# Patient Record
Sex: Female | Born: 1995 | Race: White | Hispanic: No | Marital: Single
Health system: Midwestern US, Community
[De-identification: ages and names within clinical notes are randomized; demographics above are authoritative.]

## PROBLEM LIST (undated history)

## (undated) DIAGNOSIS — K219 Gastro-esophageal reflux disease without esophagitis: Secondary | ICD-10-CM

## (undated) DIAGNOSIS — Z8619 Personal history of other infectious and parasitic diseases: Secondary | ICD-10-CM

## (undated) DIAGNOSIS — F419 Anxiety disorder, unspecified: Secondary | ICD-10-CM

## (undated) DIAGNOSIS — G43909 Migraine, unspecified, not intractable, without status migrainosus: Secondary | ICD-10-CM

## (undated) DIAGNOSIS — F909 Attention-deficit hyperactivity disorder, unspecified type: Secondary | ICD-10-CM

## (undated) DIAGNOSIS — F418 Other specified anxiety disorders: Secondary | ICD-10-CM

## (undated) DIAGNOSIS — N83209 Unspecified ovarian cyst, unspecified side: Secondary | ICD-10-CM

## (undated) DIAGNOSIS — Z0000 Encounter for general adult medical examination without abnormal findings: Secondary | ICD-10-CM

## (undated) DIAGNOSIS — E282 Polycystic ovarian syndrome: Secondary | ICD-10-CM

## (undated) DIAGNOSIS — R87629 Unspecified abnormal cytological findings in specimens from vagina: Secondary | ICD-10-CM

## (undated) DIAGNOSIS — F32A Depression, unspecified: Secondary | ICD-10-CM

## (undated) DIAGNOSIS — D473 Essential (hemorrhagic) thrombocythemia: Secondary | ICD-10-CM

## (undated) DIAGNOSIS — S42301A Unspecified fracture of shaft of humerus, right arm, initial encounter for closed fracture: Secondary | ICD-10-CM

## (undated) DIAGNOSIS — R945 Abnormal results of liver function studies: Secondary | ICD-10-CM

## (undated) DIAGNOSIS — F329 Major depressive disorder, single episode, unspecified: Secondary | ICD-10-CM

## (undated) DIAGNOSIS — Z7289 Other problems related to lifestyle: Secondary | ICD-10-CM

## (undated) DIAGNOSIS — I10 Essential (primary) hypertension: Secondary | ICD-10-CM

## (undated) DIAGNOSIS — R51 Headache: Secondary | ICD-10-CM

## (undated) DIAGNOSIS — T7840XA Allergy, unspecified, initial encounter: Secondary | ICD-10-CM

## (undated) DIAGNOSIS — E782 Mixed hyperlipidemia: Secondary | ICD-10-CM

## (undated) HISTORY — DX: Depression, unspecified: F32.A

## (undated) HISTORY — DX: Essential (primary) hypertension: I10

## (undated) HISTORY — DX: Abnormal results of liver function studies: R94.5

## (undated) HISTORY — DX: Anxiety disorder, unspecified: F41.9

## (undated) HISTORY — DX: Personal history of other infectious and parasitic diseases: Z86.19

## (undated) HISTORY — DX: Other problems related to lifestyle: Z72.89

## (undated) HISTORY — DX: Attention-deficit hyperactivity disorder, unspecified type: F90.9

## (undated) HISTORY — DX: Mixed hyperlipidemia: E78.2

## (undated) HISTORY — DX: Allergy, unspecified, initial encounter: T78.40XA

## (undated) HISTORY — DX: Encounter for general adult medical examination without abnormal findings: Z00.00

## (undated) HISTORY — DX: Unspecified abnormal cytological findings in specimens from vagina: R87.629

## (undated) HISTORY — DX: Other specified anxiety disorders: F41.8

## (undated) HISTORY — DX: Gastro-esophageal reflux disease without esophagitis: K21.9

## (undated) HISTORY — DX: Major depressive disorder, single episode, unspecified: F32.9

## (undated) HISTORY — DX: Essential (hemorrhagic) thrombocythemia: D47.3

## (undated) HISTORY — DX: Headache: R51

## (undated) HISTORY — DX: Unspecified fracture of shaft of humerus, right arm, initial encounter for closed fracture: S42.301A

---
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
## 2014-01-14 ENCOUNTER — Encounter (HOSPITAL_BASED_OUTPATIENT_CLINIC_OR_DEPARTMENT_OTHER): Payer: Self-pay

## 2014-01-14 ENCOUNTER — Emergency Department (HOSPITAL_BASED_OUTPATIENT_CLINIC_OR_DEPARTMENT_OTHER)

## 2014-01-14 ENCOUNTER — Emergency Department (HOSPITAL_BASED_OUTPATIENT_CLINIC_OR_DEPARTMENT_OTHER)
Admission: EM | Admit: 2014-01-14 | Discharge: 2014-01-14 | Disposition: A | Discharge status: Home / Self Care | Attending: Emergency Medicine | Admitting: Emergency Medicine

## 2014-01-14 DIAGNOSIS — R1031 Right lower quadrant pain: Secondary | ICD-10-CM

## 2014-01-14 DIAGNOSIS — Z79899 Other long term (current) drug therapy: Secondary | ICD-10-CM | POA: Diagnosis not present

## 2014-01-14 DIAGNOSIS — B9689 Other specified bacterial agents as the cause of diseases classified elsewhere: Secondary | ICD-10-CM

## 2014-01-14 DIAGNOSIS — Z3202 Encounter for pregnancy test, result negative: Secondary | ICD-10-CM | POA: Insufficient documentation

## 2014-01-14 DIAGNOSIS — Z720 Tobacco use: Secondary | ICD-10-CM | POA: Diagnosis not present

## 2014-01-14 DIAGNOSIS — N760 Acute vaginitis: Secondary | ICD-10-CM | POA: Diagnosis not present

## 2014-01-14 HISTORY — DX: Unspecified ovarian cyst, unspecified side: N83.209

## 2014-01-14 LAB — URINALYSIS, ROUTINE W REFLEX MICROSCOPIC
Bilirubin Urine: NEGATIVE
Glucose, UA: NEGATIVE mg/dL
Ketones, ur: NEGATIVE mg/dL
Leukocytes, UA: NEGATIVE
Nitrite: NEGATIVE
Protein, ur: NEGATIVE mg/dL
Specific Gravity, Urine: 1.022 (ref 1.005–1.030)
Urobilinogen, UA: 0.2 mg/dL (ref 0.0–1.0)
pH: 5.5 (ref 5.0–8.0)

## 2014-01-14 LAB — BASIC METABOLIC PANEL
Anion gap: 13 (ref 5–15)
BUN: 9 mg/dL (ref 6–23)
CO2: 25 mEq/L (ref 19–32)
Calcium: 9.7 mg/dL (ref 8.4–10.5)
Chloride: 102 mEq/L (ref 96–112)
Creatinine, Ser: 0.9 mg/dL (ref 0.50–1.10)
GFR calc Af Amer: >90 mL/min (ref >90)
GFR calc non Af Amer: >90 mL/min (ref >90)
Glucose, Bld: 96 mg/dL (ref 70–99)
Potassium: 4 mEq/L (ref 3.7–5.3)
Sodium: 140 mEq/L (ref 137–147)

## 2014-01-14 LAB — WET PREP, GENITAL: Trich, Wet Prep: NONE SEEN

## 2014-01-14 LAB — CBC WITH DIFFERENTIAL/PLATELET
Basophils Absolute: 0 10*3/uL (ref 0.0–0.1)
Basophils Relative: 0 % (ref 0–1)
Eosinophils Absolute: 0.3 10*3/uL (ref 0.0–0.7)
Eosinophils Relative: 4 % (ref 0–5)
HCT: 43 % (ref 36.0–46.0)
Hemoglobin: 14.5 g/dL (ref 12.0–15.0)
Lymphocytes Relative: 27 % (ref 12–46)
Lymphs Abs: 2.4 10*3/uL (ref 0.7–4.0)
MCH: 30.9 pg (ref 26.0–34.0)
MCHC: 33.7 g/dL (ref 30.0–36.0)
MCV: 91.7 fL (ref 78.0–100.0)
Monocytes Absolute: 0.8 10*3/uL (ref 0.1–1.0)
Monocytes Relative: 9 % (ref 3–12)
Neutro Abs: 5.3 10*3/uL (ref 1.7–7.7)
Neutrophils Relative %: 60 % (ref 43–77)
Platelets: 374 10*3/uL (ref 150–400)
RBC: 4.69 MIL/uL (ref 3.87–5.11)
RDW: 13.1 % (ref 11.5–15.5)
WBC: 8.9 10*3/uL (ref 4.0–10.5)

## 2014-01-14 LAB — PREGNANCY, URINE: Preg Test, Ur: NEGATIVE

## 2014-01-14 LAB — URINE MICROSCOPIC-ADD ON

## 2014-01-14 MED ORDER — METRONIDAZOLE 500 MG PO TABS: 500.0000 mg | ORAL_TABLET | Freq: Two times a day (BID) | ORAL | Status: DC

## 2014-01-14 MED ORDER — METRONIDAZOLE 500 MG PO TABS
500.0000 mg | ORAL_TABLET | Freq: Once | ORAL | Status: AC
  Administered 2014-01-14: 500 mg via ORAL
  Filled 2014-01-14: qty 1

## 2014-01-14 NOTE — ED Provider Notes (Signed)
CSN: [id number]     Arrival date & time 01/14/14  1245 History   First MD Initiated Contact with Patient 01/14/14 1352     Chief Complaint  Patient presents with  . RLQ abdominal pain      (Consider location/radiation/quality/duration/timing/severity/associated sxs/prior Treatment) HPI  Kathryn [name] is a 18 y.o. female complaining of acute onset of severe right lower quadrant pains this morning. And rated at 10 out of 10, no exacerbating or alleviating factors identified. Patient is a she has a history of frequent ovarian cysts but states this is much more severe than normal. There is associated nausea which she thinks is secondary to severe pain. Menstruation started yesterday. She denies fever, chills, abnormal vaginal discharge dysuria, hematuria. Pain is colicky, rated at 3 out of 10 right now, resolved spontaneously.  Past Medical History  Diagnosis Date  . Ovarian cyst    Past Surgical History  Procedure Laterality Date  . Adenoidectomy    . Tympanostomy tube placement     No family history on file. History  Substance Use Topics  . Smoking status: Current Some Day Smoker  . Smokeless tobacco: Not on file  . Alcohol Use: No   OB History    No data available     Review of Systems  10 systems reviewed and found to be negative, except as noted in the HPI.   Allergies  Review of patient's allergies indicates no known allergies.  Home Medications   Prior to Admission medications   Medication Sig Start Date End Date Taking? Authorizing Provider  FLUoxetine (PROZAC) 20 MG capsule Take 20 mg by mouth daily.    Historical Provider, MD  ibuprofen (ADVIL,MOTRIN) 200 MG tablet Take 200 mg by mouth every 6 (six) hours as needed for moderate pain.    Historical Provider, MD  metroNIDAZOLE (FLAGYL) 500 MG tablet Take 1 tablet (500 mg total) by mouth 2 (two) times daily. One po bid x 7 days 01/14/14   Elmyra Ricks Shrihan Putt, PA-C   BP 128/87 mmHg  Pulse 62  Temp(Src) 99.2 F (37.3  C) (Oral)  Resp 18  Ht 5\' 8"  (1.727 m)  Wt 183 lb (83.008 kg)  BMI 27.83 kg/m2  SpO2 100%  LMP 01/13/2014 Physical Exam  Constitutional: She is oriented to person, place, and time. She appears well-developed and well-nourished. No distress.  HENT:  Head: Normocephalic and atraumatic.  Mouth/Throat: Oropharynx is clear and moist.  Eyes: Conjunctivae and EOM are normal. Pupils are equal, round, and reactive to light.  Neck: Normal range of motion.  Cardiovascular: Normal rate, regular rhythm and intact distal pulses.   Pulmonary/Chest: Effort normal and breath sounds normal. No stridor. No respiratory distress. She has no wheezes. She has no rales. She exhibits no tenderness.  Abdominal: Soft. Bowel sounds are normal. She exhibits no distension and no mass. There is tenderness. There is no rebound and no guarding.  Mild tenderness to deep palpation of the right lower quadrant with no guarding or rebound, so as, Rovsingh and obturator are negative.  Genitourinary:  Pelvic exam a chaperoned by nurse, no rashes or lesions, there is scant dark blood pooled in the posterior fourchette. No cervical or adnexal tenderness to palpation  Musculoskeletal: Normal range of motion.  Neurological: She is alert and oriented to person, place, and time.  Psychiatric: She has a normal mood and affect.  Nursing note and vitals reviewed.   ED Course  Procedures (including critical care time) Labs Review Labs Reviewed  WET PREP,  GENITAL - Abnormal; Notable for the following:    Yeast Wet Prep HPF POC FEW (*)    Clue Cells Wet Prep HPF POC MODERATE (*)    WBC, Wet Prep HPF POC MODERATE (*)    All other components within normal limits  URINALYSIS, ROUTINE W REFLEX MICROSCOPIC - Abnormal; Notable for the following:    APPearance CLOUDY (*)    Hgb urine dipstick MODERATE (*)    All other components within normal limits  URINE MICROSCOPIC-ADD ON - Abnormal; Notable for the following:    Squamous  Epithelial / LPF MANY (*)    Bacteria, UA MANY (*)    All other components within normal limits  GC/CHLAMYDIA PROBE AMP  PREGNANCY, URINE  CBC WITH DIFFERENTIAL  BASIC METABOLIC PANEL    Imaging Review US Transvaginal Non-ob  01/14/2014   CLINICAL DATA:  Right lower quadrant pain.  EXAM: TRANSABDOMINAL AND TRANSVAGINAL ULTRASOUND OF PELVIS  TECHNIQUE: Both transabdominal and transvaginal ultrasound examinations of the pelvis were performed. Transabdominal technique was performed for global imaging of the pelvis including uterus, ovaries, adnexal regions, and pelvic cul-de-sac. It was necessary to proceed with endovaginal exam following the transabdominal exam to visualize the uterus, endometrium, and ovaries.  COMPARISON:  None  FINDINGS: Uterus  Measurements: 6.8 x 3.0 x 3.5 cm. 1.4 x 1.1 x 1.0 cm fundal fibroid.  Endometrium  Thickness: 6 mm.  Currently menstruating.  Right ovary  Measurements: 3.6 x 1.5 x 2.5 cm. multiple follicles. /no adnexal mass.  Left ovary  Measurements: 3.2 x 2.1 x 2.5 cm. 1.6 x 1.3 x 0.8 cm cyst or prominent follicle. Multiple small follicles.  Other findings  No free fluid.  IMPRESSION: 1. Small fibroid in the fundus of the uterus. 2. No other significant abnormalities.   Electronically Signed   By: Rozetta Nunnery M.D.   On: 01/14/2014 15:14   US Pelvis Complete  01/14/2014   CLINICAL DATA:  Right lower quadrant pain.  EXAM: TRANSABDOMINAL AND TRANSVAGINAL ULTRASOUND OF PELVIS  TECHNIQUE: Both transabdominal and transvaginal ultrasound examinations of the pelvis were performed. Transabdominal technique was performed for global imaging of the pelvis including uterus, ovaries, adnexal regions, and pelvic cul-de-sac. It was necessary to proceed with endovaginal exam following the transabdominal exam to visualize the uterus, endometrium, and ovaries.  COMPARISON:  None  FINDINGS: Uterus  Measurements: 6.8 x 3.0 x 3.5 cm. 1.4 x 1.1 x 1.0 cm fundal fibroid.  Endometrium   Thickness: 6 mm.  Currently menstruating.  Right ovary  Measurements: 3.6 x 1.5 x 2.5 cm. multiple follicles. /no adnexal mass.  Left ovary  Measurements: 3.2 x 2.1 x 2.5 cm. 1.6 x 1.3 x 0.8 cm cyst or prominent follicle. Multiple small follicles.  Other findings  No free fluid.  IMPRESSION: 1. Small fibroid in the fundus of the uterus. 2. No other significant abnormalities.   Electronically Signed   By: Rozetta Nunnery M.D.   On: 01/14/2014 15:14     EKG Interpretation None      MDM   Final diagnoses:  Right lower quadrant pain  Bacterial vaginosis    Filed Vitals:   01/14/14 1256 01/14/14 1314 01/14/14 1512 01/14/14 1703  BP: 131/96  126/74 128/87  Pulse: 78  67 62  Temp: 99 F (37.2 C)   99.2 F (37.3 C)  TempSrc: Oral   Oral  Resp: 18  18 18   Height:  5\' 8"  (1.727 m)    Weight:  183 lb (83.008 kg)  SpO2: 100%  100% 100%    Medications  metroNIDAZOLE (FLAGYL) tablet 500 mg (500 mg Oral Given 01/14/14 1729)    Kathryn [name] is a 18 y.o. female presenting with severe right lower quadrant pain resolved spontaneously to 3 out of 10. Patient declines pelvic exam because she is menstruating. States that she has never had a pelvic exam before. Negotiated with patient to obtain transvaginal ultrasound.  She also acquiesces to pelvic exam, there is no significant abnormalities on pelvic.  Blood work with no leukocytosis, the urine sample was contaminated with many epithelial cells. There are moderate clue cells and white blood cells on wet prep. We'll treat for bacterial vaginosis. Serial abdominal exams remain benign. I discussed the possibility of obtaining CAT scan to rule out early appendicitis. Ensure decision-making capacity patient has declined this we have had an extensive discussion of return precautions.  Discussed case with attending MD who agrees with plan and stability to d/c to home.   Evaluation does not show pathology that would require ongoing emergent intervention  or inpatient treatment. Pt is hemodynamically stable and mentating appropriately. Discussed findings and plan with patient/guardian, who agrees with care plan. All questions answered. Return precautions discussed and outpatient follow up given.   Discharge Medication List as of 01/14/2014  5:19 PM    START taking these medications   Details  metroNIDAZOLE (FLAGYL) 500 MG tablet Take 1 tablet (500 mg total) by mouth 2 (two) times daily. One po bid x 7 days, Starting 01/14/2014, Until Discontinued, Print             Monico Blitz, PA-C 01/14/14 2011  Ephraim Hamburger, MD 01/17/14 1019

## 2014-01-14 NOTE — ED Notes (Signed)
Patient here with RLQ abdominal pain since am. Reports that she has frequent ovarian cyst and this feels the same. Started her menstrual cycle yesterday

## 2014-01-14 NOTE — Discharge Instructions (Signed)
Do not drink alcohol while you are taking flagyl (metronidazole) because it will make you very sick.  Return to the emergency room for severely worsening abdominal pain, abdominal pain that localizes to a particular area (especially the right lower part of the belly), pain that persists past 8-10 hours, blood in stool or vomit, severe weakness, fainting, or fever.   Maintain hydration by drinking small amounts of clear fluids frequently, then soft diet, and then advance to a solid diet as tolerated. Avoid foods that are spicy, high in fat or dairy. Bacterial Vaginosis Bacterial vaginosis is a vaginal infection that occurs when the normal balance of bacteria in the vagina is disrupted. It results from an overgrowth of certain bacteria. This is the most common vaginal infection in women of childbearing age. Treatment is important to prevent complications, especially in pregnant women, as it can cause a premature delivery. CAUSES  Bacterial vaginosis is caused by an increase in harmful bacteria that are normally present in smaller amounts in the vagina. Several different kinds of bacteria can cause bacterial vaginosis. However, the reason that the condition develops is not fully understood. RISK FACTORS Certain activities or behaviors can put you at an increased risk of developing bacterial vaginosis, including:  Having a new sex partner or multiple sex partners.  Douching.  Using an intrauterine device (IUD) for contraception. Women do not get bacterial vaginosis from toilet seats, bedding, swimming pools, or contact with objects around them. SIGNS AND SYMPTOMS  Some women with bacterial vaginosis have no signs or symptoms. Common symptoms include:  Grey vaginal discharge.  A fishlike odor with discharge, especially after sexual intercourse.  Itching or burning of the vagina and vulva.  Burning or pain with urination. DIAGNOSIS  Your health care provider will take a medical history and  examine the vagina for signs of bacterial vaginosis. A sample of vaginal fluid may be taken. Your health care provider will look at this sample under a microscope to check for bacteria and abnormal cells. A vaginal pH test may also be done.  TREATMENT  Bacterial vaginosis may be treated with antibiotic medicines. These may be given in the form of a pill or a vaginal cream. A second round of antibiotics may be prescribed if the condition comes back after treatment.  HOME CARE INSTRUCTIONS   Only take over-the-counter or prescription medicines as directed by your health care provider.  If antibiotic medicine was prescribed, take it as directed. Make sure you finish it even if you start to feel better.  Do not have sex until treatment is completed.  Tell all sexual partners that you have a vaginal infection. They should see their health care provider and be treated if they have problems, such as a mild rash or itching.  Practice safe sex by using condoms and only having one sex partner. SEEK MEDICAL CARE IF:   Your symptoms are not improving after 3 days of treatment.  You have increased discharge or pain.  You have a fever. MAKE SURE YOU:   Understand these instructions.  Will watch your condition.  Will get help right away if you are not doing well or get worse. FOR MORE INFORMATION  Centers for Disease Control and Prevention, Division of STD Prevention: AppraiserFraud.fi American Sexual Health Association (ASHA): www.ashastd.org  Document Released: 02/02/2005 Document Revised: 11/23/2012 Document Reviewed: 09/14/2012 [facility] Patient Information 2015 [place], [place]. This information is not intended to replace advice given to you by your health care provider. Make sure you discuss  any questions you have with your health care provider.   Please obtain primary care using resource guide below. But the minute you were seen in the emergency room and that they will need to obtain records  for further outpatient management.   Emergency Department Resource Guide 1) Find a Doctor and Pay Out of Pocket Although you won't have to find out who is covered by your insurance plan, it is a good idea to ask around and get recommendations. You will then need to call the office and see if the doctor you have chosen will accept you as a new patient and what types of options they offer for patients who are self-pay. Some doctors offer discounts or will set up payment plans for their patients who do not have insurance, but you will need to ask so you aren't surprised when you get to your appointment.  2) Contact Your Local Health Department Not all health departments have doctors that can see patients for sick visits, but many do, so it is worth a call to see if yours does. If you don't know where your local health department is, you can check in your phone book. The CDC also has a tool to help you locate your state's health department, and many state websites also have listings of all of their local health departments.  3) Find a [facility] If your illness is not likely to be very severe or complicated, you may want to try a walk in clinic. These are popping up all over the country in pharmacies, drugstores, and shopping centers. They're usually staffed by nurse practitioners or physician assistants that have been trained to treat common illnesses and complaints. They're usually fairly quick and inexpensive. However, if you have serious medical issues or chronic medical problems, these are probably not your best option.  No Primary Care Doctor: - Call Health Connect at  [phone] - they can help you locate a primary care doctor that  accepts your insurance, provides certain services, etc. - Physician Referral Service- [phone]  Chronic Pain Problems: Organization         Address  Phone   Notes  [facility]  [phone] Patients need to be referred by their primary  care doctor.   Medication Assistance: Organization         Address  Phone   Notes  [facility] Medication [facility] [address] [place], [place] [address] [phone] --Must be a resident of [facility] -- Must have NO insurance coverage whatsoever (no Medicaid/ Medicare, etc.) -- The pt. MUST have a primary care doctor that directs their care regularly and follows them in the community   Med[facility]ist  [phone]   [employer]  [phone]    Agencies that provide inexpensive medical care: Organization         Address  Phone   Notes  [place]  [phone]   [facility]    [phone]   [facility] [place], [place] [address] [phone]   [place] [address] [place] [phone]   Planned Parenthood    [phone]   [facility]    [phone]   [place] and [address], [place] Phone:  [phone], Fax:  [phone] Hours of Operation:  9 am - 6 pm, M-F.  Also accepts  Medicaid/Medicare and self-pay.  [facility] for [address], [address], [place] Phone: [phone], Fax: [phone]. Hours of Operation:  8:30 am - 5:30 pm, M-F.  Also accepts Medicaid and self-pay.  [facility] [place] [address] [place] Phone: [phone]   [place], [place], [place] [phone], Ext. [phone] Mondays & Thursdays: 7-9 AM.  First 15 patients are seen on a first come, first serve basis.    [place] Providers:  Organization         Address  Phone   Notes  [facility] [address],  [phone] Also accepts self-pay patients.  [facility] [address], [place]  [phone]   [place], [address], [place] [phone]   [facility] [address], [place] [phone]   Lucianne Lei [address], [place]   [phone] Only accepts [place] Access [place] patients after they have their name applied to their card.   Self-Pay (no insurance) in [facility]:  Organization         Address  Phone   Notes  Sickle Cell Patients, [facility] Internal [facility] [place] [phone]   [facility] [place] [phone]   [facility] [place]  [place], [address] [place], [place] [phone]   [facility]/Dr. Osei-Bonsu  [address] [place] or [address], [place] [phone] Phone number for both [place] and [place] locations is the same.  [facility]gent [facility] and [facility] [address] [place] [phone]   [facility] [address] [place] or [address] [phone]   [facility] [address] [place] [phone], phone; [phone], fax Sees patients 1st and 3rd Saturday of every month.  Must not qualify for public or private insurance (i.e. Medicaid, Medicare, [place] Health Choice, Veterans' Benefits)  Household income should be no more than 200% of the poverty level The clinic cannot treat you if you are pregnant or think you are pregnant  Sexually transmitted diseases are not treated at the clinic.    Dental Care: Organization         Address  Phone  Notes  [facility] Department of [facility] [place] [phone] Accepts children up to age 48 who are enrolled in [place] or [place]; pregnant women with a Medicaid card; and children who have applied for Medicaid or [place] Health Choice, but were declined, whose parents can pay a reduced fee at  time of service.  [facility] Department of [facility]  [address], [place] [phone] Accepts children up to age 43 who are enrolled in [place] or [place]; pregnant women with a Medicaid card; and children who have applied for Medicaid or [place] Health Choice, but were declined, whose parents can pay a reduced fee at time of service.  [place] Adult Dental Access PROGRAM  [place] [phone] Patients are seen by appointment only. Walk-ins are not accepted. [place] will see patients 18 years of age and older. Monday - Tuesday (8am-5pm) Most  Wednesdays (8:30-5pm) $30 per visit, cash only  [facility] Adult Dental Access PROGRAM  [address], [facility] [phone] Patients are seen by appointment only. Walk-ins are not accepted. [place] will see patients 65 years of age and older. One Wednesday Evening (Monthly: Volunteer Based).  $30 per visit, cash only  [place]  [phone] for adults; Children under age 21, call Graduate Pediatric Dentistry at [phone]. Children aged 28-14, please call [phone] to request a pediatric application.  Dental services are provided in all areas of dental care including fillings, crowns and bridges, complete and partial dentures, implants, gum treatment, root canals, and extractions. Preventive care is also provided. Treatment is provided to both adults and children. Patients are selected via a lottery and there is often a waiting list.   [facility] [address], [place]  [phone] [email or web address]   [facility] [address] [place], [place] [phone], Ext. [phone] Second and Fourth Thursday of each month, opens at 6:30 AM; Clinic ends at 9 AM.  Patients are seen on a first-come first-served basis, and a limited number are seen during each clinic.   [facility]  [address] Hillard Danker  [place], [place] [phone]   Eligibility Requirements You must have lived in [place], [place], or [place] counties for at least the last three months.   You cannot be eligible for state or federal sponsored [employer], including [employer], [place], or [employer].   You generally cannot be eligible for healthcare insurance through your employer.    How to apply: Eligibility screenings are held every Tuesday and Wednesday afternoon from 1:00 pm until 4:00 pm. You do not need an appointment for the interview!  [facility] [address], [place], [place]   [place]  [place] Department  [place]  [phone]    Behavioral Health Resources in the Community: Intensive Outpatient Programs Organization         Address  Phone  Notes  [place]. [address] [place], [place] [phone]   [facility] Outpatient [address] [place], [place]   ADS: Alcohol & Drug Svcs [address] [place], [place]   [place] [address],  [place], [place] or [phone]   Substance Abuse Resources Organization         Address  Phone  Notes  Alcohol and Drug Services  [phone]   [place]  [phone]   [facility] [place]   Chinita Pester  [phone]   Residential & Outpatient Substance Abuse Program  [phone]   Psychological Services Organization         Address  Phone  Notes  [facility] [place]  [place]  [phone]   [place] [address] [place] or [phone]    Mobile Crisis Teams Organization         Address  Phone  Notes  [facility], Mobile Crisis Care Unit  [phone]   Assertive Psychotherapeutic  Services  [address] [place], [place]   [place] [address], [place] [phone]    Self-Help/Support Groups Organization         Address  Phone  Notes  Mental Health Assoc. of [place] - variety of support groups  [place] Call for more information  Narcotics Anonymous (NA), Caring Services [address], [employer] [place]  2 meetings at this location   Special educational needs teacher         Address  Phone  Notes  ASAP Residential Treatment [place],    [place]  [phone]   [facility]  [address], [place] [address], [place], [place]   [place], [place] [phone] Admissions: 8am-3pm M-F  Incentives Substance [place] [address],    [place], [place] [phone]   [facility] [address] [place], [place], [place]   [facility] [address]  [place], [place]   [facility] Programs - Intensive Outpatient [address]een [address], [place], [place]   [facility] ([place] [address].,  [place], [place] [phone] or [phone]   [facility] Services (RTS) [address] [place], [place] Accepts Medicaid  [facility] [place] [address]  [place] [phone] Substance Abuse/Addiction Treatment   Health Central Organization         Address  Phone  Notes  [facility]  [phone]   Domenic Schwab, PhD [address] Arlis Porta [place], [place]   [phone] or [phone]   [place], [place] [phone]   [facility] [phone] [place], [place] [phone] Insurance/Medicaid/sponsorship through [facility] and Families [address], [address]                                    [place], [place] [phone] [place] [address], [place] [phone]    Dr. Adele Schilder  [phone]   [facility] Dept. 1) [address] [place] 2) [place] 3)  [address], [place] [phone]  [phone]   [place] [phone] or [phone] (After Hours)

## 2014-01-14 NOTE — ED Notes (Signed)
Pt given a coke to drink.

## 2014-01-16 LAB — GC/CHLAMYDIA PROBE AMP
CT Probe RNA: NEGATIVE
GC Probe RNA: NEGATIVE

## 2014-04-03 ENCOUNTER — Encounter (HOSPITAL_BASED_OUTPATIENT_CLINIC_OR_DEPARTMENT_OTHER): Payer: Self-pay | Admitting: Emergency Medicine

## 2014-04-03 ENCOUNTER — Emergency Department (HOSPITAL_BASED_OUTPATIENT_CLINIC_OR_DEPARTMENT_OTHER)

## 2014-04-03 ENCOUNTER — Emergency Department (HOSPITAL_BASED_OUTPATIENT_CLINIC_OR_DEPARTMENT_OTHER)
Admission: EM | Admit: 2014-04-03 | Discharge: 2014-04-03 | Disposition: A | Discharge status: Home / Self Care | Attending: Emergency Medicine | Admitting: Emergency Medicine

## 2014-04-03 DIAGNOSIS — Z79899 Other long term (current) drug therapy: Secondary | ICD-10-CM | POA: Diagnosis not present

## 2014-04-03 DIAGNOSIS — Z3202 Encounter for pregnancy test, result negative: Secondary | ICD-10-CM | POA: Diagnosis not present

## 2014-04-03 DIAGNOSIS — Z792 Long term (current) use of antibiotics: Secondary | ICD-10-CM | POA: Insufficient documentation

## 2014-04-03 DIAGNOSIS — N898 Other specified noninflammatory disorders of vagina: Secondary | ICD-10-CM | POA: Diagnosis not present

## 2014-04-03 DIAGNOSIS — Z8639 Personal history of other endocrine, nutritional and metabolic disease: Secondary | ICD-10-CM | POA: Insufficient documentation

## 2014-04-03 DIAGNOSIS — N3000 Acute cystitis without hematuria: Secondary | ICD-10-CM

## 2014-04-03 DIAGNOSIS — Z87891 Personal history of nicotine dependence: Secondary | ICD-10-CM | POA: Insufficient documentation

## 2014-04-03 DIAGNOSIS — R1031 Right lower quadrant pain: Secondary | ICD-10-CM

## 2014-04-03 HISTORY — DX: Polycystic ovarian syndrome: E28.2

## 2014-04-03 LAB — URINE MICROSCOPIC-ADD ON

## 2014-04-03 LAB — URINALYSIS, ROUTINE W REFLEX MICROSCOPIC
Bilirubin Urine: NEGATIVE
Glucose, UA: NEGATIVE mg/dL
Hgb urine dipstick: NEGATIVE
Ketones, ur: NEGATIVE mg/dL
Nitrite: NEGATIVE
Protein, ur: NEGATIVE mg/dL
Specific Gravity, Urine: 1.014 (ref 1.005–1.030)
Urobilinogen, UA: 0.2 mg/dL (ref 0.0–1.0)
pH: 6 (ref 5.0–8.0)

## 2014-04-03 LAB — COMPREHENSIVE METABOLIC PANEL
ALT: 22 U/L (ref 0–35)
AST: 22 U/L (ref 0–37)
Albumin: 5 g/dL (ref 3.5–5.2)
Alkaline Phosphatase: 68 U/L (ref 39–117)
Anion gap: 3 — ABNORMAL LOW (ref 5–15)
BUN: 10 mg/dL (ref 6–23)
CO2: 27 mmol/L (ref 19–32)
Calcium: 9.1 mg/dL (ref 8.4–10.5)
Chloride: 109 mmol/L (ref 96–112)
Creatinine, Ser: 0.76 mg/dL (ref 0.50–1.10)
GFR calc Af Amer: >90 mL/min (ref >90)
GFR calc non Af Amer: >90 mL/min (ref >90)
Glucose, Bld: 87 mg/dL (ref 70–99)
Potassium: 3.9 mmol/L (ref 3.5–5.1)
Sodium: 139 mmol/L (ref 135–145)
Total Bilirubin: 0.5 mg/dL (ref 0.3–1.2)
Total Protein: 7.8 g/dL (ref 6.0–8.3)

## 2014-04-03 LAB — CBC WITH DIFFERENTIAL/PLATELET
Basophils Absolute: 0 10*3/uL (ref 0.0–0.1)
Basophils Relative: 0 % (ref 0–1)
Eosinophils Absolute: 0.4 10*3/uL (ref 0.0–0.7)
Eosinophils Relative: 3 % (ref 0–5)
HCT: 42.5 % (ref 36.0–46.0)
Hemoglobin: 14.2 g/dL (ref 12.0–15.0)
Lymphocytes Relative: 29 % (ref 12–46)
Lymphs Abs: 3.7 10*3/uL (ref 0.7–4.0)
MCH: 30.5 pg (ref 26.0–34.0)
MCHC: 33.4 g/dL (ref 30.0–36.0)
MCV: 91.2 fL (ref 78.0–100.0)
Monocytes Absolute: 1.3 10*3/uL — ABNORMAL HIGH (ref 0.1–1.0)
Monocytes Relative: 10 % (ref 3–12)
Neutro Abs: 7.6 10*3/uL (ref 1.7–7.7)
Neutrophils Relative %: 58 % (ref 43–77)
Platelets: 397 10*3/uL (ref 150–400)
RBC: 4.66 MIL/uL (ref 3.87–5.11)
RDW: 12.7 % (ref 11.5–15.5)
WBC: 12.9 10*3/uL — ABNORMAL HIGH (ref 4.0–10.5)

## 2014-04-03 LAB — WET PREP, GENITAL
Clue Cells Wet Prep HPF POC: NONE SEEN
Trich, Wet Prep: NONE SEEN
Yeast Wet Prep HPF POC: NONE SEEN

## 2014-04-03 LAB — PREGNANCY, URINE: Preg Test, Ur: NEGATIVE

## 2014-04-03 MED ORDER — NITROFURANTOIN MONOHYD MACRO 100 MG PO CAPS: 100.0000 mg | ORAL_CAPSULE | Freq: Two times a day (BID) | ORAL | Status: DC

## 2014-04-03 NOTE — ED Notes (Signed)
Painful urination for a couple episodes then recurrent today. Pt vomited X1 yesterday.

## 2014-04-03 NOTE — ED Provider Notes (Signed)
CSN: [id number]     Arrival date & time 04/03/14  0941 History   First MD Initiated Contact with Patient 04/03/14 1241     Chief Complaint  Patient presents with  . Abdominal Pain     (Consider location/radiation/quality/duration/timing/severity/associated sxs/prior Treatment) HPI Kathryn [name] is a 19 year old female with past medical history of ovarian cysts, PCOS who presents to the ER complaining of abdominal pain and dysuria. Patient reports intermittent right lower quadrant abdominal pain for the past 2 days with associated dysuria. Patient reports associated nausea, vomiting during these episodes. Patient states the pain and associated symptoms resolve spontaneously, and she remains asymptomatic in between these episodes of abdominal pain. Patient states the episode of abdominal pain she had this morning lasted approximately 3 hours, and it was worse than her previous episodes. Patient denies fever, flank pain, dizziness, weakness, chest pain, shortness of breath, vaginal discharge, vaginal pain, diarrhea.  Past Medical History  Diagnosis Date  . Ovarian cyst   . PCOS (polycystic ovarian syndrome)    Past Surgical History  Procedure Laterality Date  . Adenoidectomy    . Tympanostomy tube placement     No family history on file. History  Substance Use Topics  . Smoking status: Former Research scientist (life sciences)  . Smokeless tobacco: Current User    Types: Chew  . Alcohol Use: No   OB History    No data available     Review of Systems  Constitutional: Negative for fever.  HENT: Negative for trouble swallowing.   Eyes: Negative for visual disturbance.  Respiratory: Negative for shortness of breath.   Cardiovascular: Negative for chest pain.  Gastrointestinal: Positive for nausea, vomiting and abdominal pain.  Genitourinary: Positive for dysuria. Negative for vaginal bleeding, vaginal discharge and vaginal pain.  Musculoskeletal: Negative for neck pain.  Skin: Negative for rash.   Neurological: Negative for dizziness, weakness and numbness.  Psychiatric/Behavioral: Negative.       Allergies  Review of patient's allergies indicates no known allergies.  Home Medications   Prior to Admission medications   Medication Sig Start Date End Date Taking? Authorizing Provider  ibuprofen (ADVIL,MOTRIN) 200 MG tablet Take 200 mg by mouth every 6 (six) hours as needed for moderate pain.   Yes Historical Provider, MD  FLUoxetine (PROZAC) 20 MG capsule Take 20 mg by mouth daily.    Historical Provider, MD  metroNIDAZOLE (FLAGYL) 500 MG tablet Take 1 tablet (500 mg total) by mouth 2 (two) times daily. One po bid x 7 days 01/14/14   Elmyra Ricks Pisciotta, PA-C  nitrofurantoin, macrocrystal-monohydrate, (MACROBID) 100 MG capsule Take 1 capsule (100 mg total) by mouth 2 (two) times daily. X 5 days 04/03/14   Carrie Mew, PA-C   BP 135/87 mmHg  Pulse 82  Temp(Src) 98.4 F (36.9 C) (Oral)  Resp 16  Ht 5\' 8"  (1.727 m)  Wt 185 lb (83.915 kg)  BMI 28.14 kg/m2  SpO2 100%  LMP 03/14/2014 Physical Exam  Constitutional: She is oriented to person, place, and time. She appears well-developed and well-nourished. No distress.  HENT:  Head: Normocephalic and atraumatic.  Mouth/Throat: Oropharynx is clear and moist. No oropharyngeal exudate.  Eyes: Right eye exhibits no discharge. Left eye exhibits no discharge. No scleral icterus.  Neck: Normal range of motion.  Cardiovascular: Normal rate, regular rhythm and normal heart sounds.   No murmur heard. Pulmonary/Chest: Effort normal and breath sounds normal. No respiratory distress.  Abdominal: Soft. Normal appearance and bowel sounds are normal. There is tenderness in  the right lower quadrant.  Genitourinary: There is no rash, tenderness, lesion or injury on the right labia. There is no rash, tenderness, lesion or injury on the left labia. Cervix exhibits no motion tenderness, no discharge and no friability. Right adnexum displays no mass,  no tenderness and no fullness. Left adnexum displays no mass, no tenderness and no fullness. No erythema, tenderness or bleeding in the vagina. No foreign body around the vagina. No signs of injury around the vagina. Vaginal discharge found.  Mild amount of white colored discharge noted in vaginal vault. No adnexal tenderness, cervical motion tenderness, cervical friability or discharge noted. Chaperone present during entire pelvic exam.  Musculoskeletal: Normal range of motion. She exhibits no edema or tenderness.  Neurological: She is alert and oriented to person, place, and time. She has normal strength. No cranial nerve deficit or sensory deficit. Coordination normal. GCS eye subscore is 4. GCS verbal subscore is 5. GCS motor subscore is 6.  Skin: Skin is warm and dry. No rash noted. She is not diaphoretic.  Psychiatric: She has a normal mood and affect.  Nursing note and vitals reviewed.   ED Course  Procedures (including critical care time) Labs Review Labs Reviewed  WET PREP, GENITAL - Abnormal; Notable for the following:    WBC, Wet Prep HPF POC MODERATE (*)    All other components within normal limits  URINALYSIS, ROUTINE W REFLEX MICROSCOPIC - Abnormal; Notable for the following:    APPearance CLOUDY (*)    Leukocytes, UA MODERATE (*)    All other components within normal limits  URINE MICROSCOPIC-ADD ON - Abnormal; Notable for the following:    Squamous Epithelial / LPF FEW (*)    Bacteria, UA FEW (*)    All other components within normal limits  CBC WITH DIFFERENTIAL/PLATELET - Abnormal; Notable for the following:    WBC 12.9 (*)    Monocytes Absolute 1.3 (*)    All other components within normal limits  COMPREHENSIVE METABOLIC PANEL - Abnormal; Notable for the following:    Anion gap 3 (*)    All other components within normal limits  URINE CULTURE  PREGNANCY, URINE  GC/CHLAMYDIA PROBE AMP ()    Imaging Review No results found.   EKG  Interpretation None      MDM   Final diagnoses:  RLQ abdominal pain  Acute cystitis without hematuria    Patient here with pelvic pain, intermittently over the past several days. Throughout patient's stay in the ER she is completely asymptomatic. With serial abdominal exams there is minimal amount of pain in her lower right pelvic region, no adnexal tenderness, GU exam benign. Is recommended the patient is followed with a pelvic ultrasound and transvaginal rule out of ovarian torsion, however patient denies this test being performed. I explained the risks and benefits of tonight's test, and explained why this test was needed at this point to further evaluate her pain, however patient is adamant that she did not want a pelvic ultrasound. With patient's benign abdominal exam, and minimal amount of symptoms in the ER, clinical picture does not show any signs of hemodynamic instability. There is no concern for acute abdomen,Patient is nontoxic, nonseptic appearing, in no apparent distress.  Patient's pain and other symptoms adequately managed in emergency department.  Fluid bolus given.  Labs, and vitals reviewed.  Patient does not meet the SIRS or Sepsis criteria.  On repeat exam patient does not have a surgical abdomin and there are no peritoneal signs.  No indication of appendicitis, bowel obstruction, bowel perforation, cholecystitis, diverticulitis, PID or ectopic pregnancy. Therefore I discussed strict return precautions with patient, and provided treatment for urinary tract infection. I discussed the dangers exam of refusing further care, however agent remained adamant in her decision. I strongly encouraged patient to continue the antibiotics, return or with any worsening of symptoms, return to the ER should she have any questions or concerns. Patient verbalizes understanding and agreement of this plan.  BP 135/87 mmHg  Pulse 82  Temp(Src) 98.4 F (36.9 C) (Oral)  Resp 16  Ht 5\' 8"  (1.727 m)   Wt 185 lb (83.915 kg)  BMI 28.14 kg/m2  SpO2 100%  LMP 03/14/2014  Signed,  Dahlia Bailiff, PA-C 12:05 AM  Patient seen and discussed with Dr. Varney Biles, MD  Carrie Mew, PA-C 04/04/14 0005  Varney Biles, MD 04/04/14 1530

## 2014-04-03 NOTE — ED Notes (Signed)
PA at bedside.

## 2014-04-03 NOTE — Discharge Instructions (Signed)
Urinary Tract Infection Urinary tract infections (UTIs) can develop anywhere along your urinary tract. Your urinary tract is your body's drainage system for removing wastes and extra water. Your urinary tract includes two kidneys, two ureters, a bladder, and a urethra. Your kidneys are a pair of bean-shaped organs. Each kidney is about the size of your fist. They are located below your ribs, one on each side of your spine. CAUSES Infections are caused by microbes, which are microscopic organisms, including fungi, viruses, and bacteria. These organisms are so small that they can only be seen through a microscope. Bacteria are the microbes that most commonly cause UTIs. SYMPTOMS  Symptoms of UTIs may vary by age and gender of the patient and by the location of the infection. Symptoms in young women typically include a frequent and intense urge to urinate and a painful, burning feeling in the bladder or urethra during urination. Older women and men are more likely to be tired, shaky, and weak and have muscle aches and abdominal pain. A fever may mean the infection is in your kidneys. Other symptoms of a kidney infection include pain in your back or sides below the ribs, nausea, and vomiting. DIAGNOSIS To diagnose a UTI, your caregiver will ask you about your symptoms. Your caregiver also will ask to provide a urine sample. The urine sample will be tested for bacteria and white blood cells. White blood cells are made by your body to help fight infection. TREATMENT  Typically, UTIs can be treated with medication. Because most UTIs are caused by a bacterial infection, they usually can be treated with the use of antibiotics. The choice of antibiotic and length of treatment depend on your symptoms and the type of bacteria causing your infection. HOME CARE INSTRUCTIONS  If you were prescribed antibiotics, take them exactly as your caregiver instructs you. Finish the medication even if you feel better after you  have only taken some of the medication.  Drink enough water and fluids to keep your urine clear or pale yellow.  Avoid caffeine, tea, and carbonated beverages. They tend to irritate your bladder.  Empty your bladder often. Avoid holding urine for long periods of time.  Empty your bladder before and after sexual intercourse.  After a bowel movement, women should cleanse from front to back. Use each tissue only once. SEEK MEDICAL CARE IF:   You have back pain.  You develop a fever.  Your symptoms do not begin to resolve within 3 days. SEEK IMMEDIATE MEDICAL CARE IF:   You have severe back pain or lower abdominal pain.  You develop chills.  You have nausea or vomiting.  You have continued burning or discomfort with urination. MAKE SURE YOU:   Understand these instructions.  Will watch your condition.  Will get help right away if you are not doing well or get worse. Document Released: 11/12/2004 Document Revised: 08/04/2011 Document Reviewed: 03/13/2011 [facility] Patient Information 2015 [place], [place]. This information is not intended to replace advice given to you by your health care provider. Make sure you discuss any questions you have with your health care provider.   Pelvic Pain Female pelvic pain can be caused by many different things and start from a variety of places. Pelvic pain refers to pain that is located in the lower half of the abdomen and between your hips. The pain may occur over a short period of time (acute) or may be reoccurring (chronic). The cause of pelvic pain may be related to disorders affecting  the female reproductive organs (gynecologic), but it may also be related to the bladder, kidney stones, an intestinal complication, or muscle or skeletal problems. Getting help right away for pelvic pain is important, especially if there has been severe, sharp, or a sudden onset of unusual pain. It is also important to get help right away because some types of  pelvic pain can be life threatening.  CAUSES  Below are only some of the causes of pelvic pain. The causes of pelvic pain can be in one of several categories.   Gynecologic.  Pelvic inflammatory disease.  Sexually transmitted infection.  Ovarian cyst or a twisted ovarian ligament (ovarian torsion).  Uterine lining that grows outside the uterus (endometriosis).  Fibroids, cysts, or tumors.  Ovulation.  Pregnancy.  Pregnancy that occurs outside the uterus (ectopic pregnancy).  Miscarriage.  Labor.  Abruption of the placenta or ruptured uterus.  Infection.  Uterine infection (endometritis).  Bladder infection.  Diverticulitis.  Miscarriage related to a uterine infection (septic abortion).  Bladder.  Inflammation of the bladder (cystitis).  Kidney stone(s).  Gastrointestinal.  Constipation.  Diverticulitis.  Neurologic.  Trauma.  Feeling pelvic pain because of mental or emotional causes (psychosomatic).  Cancers of the bowel or pelvis. EVALUATION  Your caregiver will want to take a careful history of your concerns. This includes recent changes in your health, a careful gynecologic history of your periods (menses), and a sexual history. Obtaining your family history and medical history is also important. Your caregiver may suggest a pelvic exam. A pelvic exam will help identify the location and severity of the pain. It also helps in the evaluation of which organ system may be involved. In order to identify the cause of the pelvic pain and be properly treated, your caregiver may order tests. These tests may include:   A pregnancy test.  Pelvic ultrasonography.  An X-ray exam of the abdomen.  A urinalysis or evaluation of vaginal discharge.  Blood tests. HOME CARE INSTRUCTIONS   Only take over-the-counter or prescription medicines for pain, discomfort, or fever as directed by your caregiver.   Rest as directed by your caregiver.   Eat a balanced  diet.   Drink enough fluids to make your urine clear or pale yellow, or as directed.   Avoid sexual intercourse if it causes pain.   Apply warm or cold compresses to the lower abdomen depending on which one helps the pain.   Avoid stressful situations.   Keep a journal of your pelvic pain. Write down when it started, where the pain is located, and if there are things that seem to be associated with the pain, such as food or your menstrual cycle.  Follow up with your caregiver as directed.  SEEK MEDICAL CARE IF:  Your medicine does not help your pain.  You have abnormal vaginal discharge. SEEK IMMEDIATE MEDICAL CARE IF:   You have heavy bleeding from the vagina.   Your pelvic pain increases.   You feel light-headed or faint.   You have chills.   You have pain with urination or blood in your urine.   You have uncontrolled diarrhea or vomiting.   You have a fever or persistent symptoms for more than 3 days.  You have a fever and your symptoms suddenly get worse.   You are being physically or sexually abused.  MAKE SURE YOU:  Understand these instructions.  Will watch your condition.  Will get help if you are not doing well or get worse. Document Released:  12/31/2003 Document Revised: 06/19/2013 Document Reviewed: 05/25/2011 [employer] Patient Information 2015 [place], [place]. This information is not intended to replace advice given to you by your health care provider. Make sure you discuss any questions you have with your health care provider.

## 2014-04-03 NOTE — ED Notes (Signed)
Pt getting in gown 

## 2014-04-04 LAB — GC/CHLAMYDIA PROBE AMP (~~LOC~~) NOT AT ARMC
Chlamydia: NEGATIVE
Neisseria Gonorrhea: NEGATIVE

## 2014-04-05 LAB — URINE CULTURE: Colony Count: 3000

## 2014-04-11 ENCOUNTER — Encounter: Payer: Self-pay | Admitting: Medical

## 2014-04-11 ENCOUNTER — Ambulatory Visit (INDEPENDENT_AMBULATORY_CARE_PROVIDER_SITE_OTHER): Admitting: Medical

## 2014-04-11 VITALS — BP 138/80 | HR 72 | Temp 98.8°F | Ht 68.0 in | Wt 185.8 lb

## 2014-04-11 DIAGNOSIS — F418 Other specified anxiety disorders: Secondary | ICD-10-CM

## 2014-04-11 DIAGNOSIS — R102 Pelvic and perineal pain: Secondary | ICD-10-CM

## 2014-04-11 DIAGNOSIS — E282 Polycystic ovarian syndrome: Secondary | ICD-10-CM

## 2014-04-11 DIAGNOSIS — R109 Unspecified abdominal pain: Secondary | ICD-10-CM | POA: Insufficient documentation

## 2014-04-11 DIAGNOSIS — N949 Unspecified condition associated with female genital organs and menstrual cycle: Secondary | ICD-10-CM

## 2014-04-11 DIAGNOSIS — K219 Gastro-esophageal reflux disease without esophagitis: Secondary | ICD-10-CM

## 2014-04-11 DIAGNOSIS — F3161 Bipolar disorder, current episode mixed, mild: Secondary | ICD-10-CM | POA: Insufficient documentation

## 2014-04-11 DIAGNOSIS — J3089 Other allergic rhinitis: Secondary | ICD-10-CM

## 2014-04-11 DIAGNOSIS — D72829 Elevated white blood cell count, unspecified: Secondary | ICD-10-CM

## 2014-04-11 DIAGNOSIS — F3178 Bipolar disorder, in full remission, most recent episode mixed: Secondary | ICD-10-CM | POA: Insufficient documentation

## 2014-04-11 DIAGNOSIS — F4323 Adjustment disorder with mixed anxiety and depressed mood: Secondary | ICD-10-CM

## 2014-04-11 DIAGNOSIS — F411 Generalized anxiety disorder: Secondary | ICD-10-CM

## 2014-04-11 DIAGNOSIS — J309 Allergic rhinitis, unspecified: Secondary | ICD-10-CM | POA: Insufficient documentation

## 2014-04-11 HISTORY — DX: Other specified anxiety disorders: F41.8

## 2014-04-11 MED ORDER — RANITIDINE HCL 150 MG PO TABS: 150.0000 mg | ORAL_TABLET | Freq: Two times a day (BID) | ORAL | Status: DC

## 2014-04-11 MED ORDER — SERTRALINE HCL 25 MG PO TABS: 25.0000 mg | ORAL_TABLET | Freq: Every day | ORAL | Status: DC

## 2014-04-11 NOTE — Progress Notes (Signed)
   Subjective:    Patient ID: Kathryn [name], female    DOB: 1995/06/02, 19 y.o.   MRN: [id number]  HPI   I have reviewed pt PMH, PSH, FH, Social History and Surgical History  Seasonal allergies- occasional and mild. Otc meds help.  Depression and anxiety-Pt used to be on prozac. Was on for about a year. But started taking daily for 6 months. But she felt like it did not help much. But prior med was expired. Mild presently.  GERD- Pt states symptoms comes and goes. Diet related. Will last 2 weeks or so then subside. Pt used to have rantidine rx.  Pt states   Pt stopped smoking 7-8 months ago.  LMP- Not late. States not pregnant.  Pt went to ED  Last week and was treated for uti but culture showed insignificant growth. Pt has hx of pcos. Recent dx by gyn. She was given rx of ocp. Her culture was basically negative. Ancillary studies were normal. Cbc- mild wbc was elevated. G and C.  Pt basically describing occasional  intermittent rlq pain in the past week. Pain returns randomly every 2-3 days. Will occur for about 30 minutes to couple of hours. This pain on and off for about a week. Some occasioanal feel of fever and sweats with this.  Today no significant pain.  Hx of pcos.   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 High School education.             Review of Systems  Constitutional: Negative for fever, chills and fatigue.  Respiratory: Negative for apnea, cough, choking, chest tightness, shortness of breath and wheezing.   Cardiovascular: Negative for chest pain and palpitations.  Gastrointestinal: Positive for abdominal pain.       Occasional gerd but none now.  Endocrine: Negative for polydipsia, polyphagia and polyuria.  Genitourinary: Negative for dysuria, urgency, frequency, flank pain, vaginal bleeding, vaginal discharge, difficulty urinating, vaginal pain and menstrual problem.    Neurological: Negative for dizziness and facial asymmetry.  Psychiatric/Behavioral: Positive for dysphoric mood. Negative for suicidal ideas, behavioral problems, confusion, sleep disturbance and agitation.       Mild.       Objective:   Physical Exam  General Mental Status- Alert. General Appearance- Not in acute distress.   Skin General: Color- Normal Color. Moisture- Normal Moisture.  Neck Carotid Arteries- Normal color. Moisture- Normal Moisture. No carotid bruits. No JVD.  Chest and Lung Exam Auscultation: Breath Sounds:-Normal.  Cardiovascular Auscultation:Rythm- Regular. Murmurs & Other Heart Sounds:Auscultation of the heart reveals- No Murmurs.  Abdomen Inspection:-Inspeection Normal. Palpation/Percussion:Note:No mass. Palpation and Percussion of the abdomen reveal- faint rt adnexal region Tender, Non Distended + BS, no rebound or guarding.    Neurologic Cranial Nerve exam:- CN III-XII intact(No nystagmus), symmetric smile. Strength:- 5/5 equal and symmetric strength both upper and lower extremities.      Assessment & Plan:

## 2014-04-11 NOTE — Progress Notes (Signed)
Pre visit review using our clinic review tool, if applicable. No additional management support is needed unless otherwise documented below in the visit note. 

## 2014-04-11 NOTE — Assessment & Plan Note (Addendum)
With some depression. Rx sertraline.

## 2014-04-11 NOTE — Patient Instructions (Addendum)
Allergic rhinitis Mild spring time. None presently.   GERD (gastroesophageal reflux disease) Healthy diet and rx rantidine   Generalized anxiety disorder With some depression. Rx sertraline.   PCOS (polycystic ovarian syndrome) Recommend staying on ocp that gyn rx'd.   Adnexal tenderness, right With recent on and off pain.Hx of pcos. Will get pelvic US. And repeat cbc due to mild increase wbc other day.   If pain worsens or increases then must consider differntial dx of appendicitis. Doubt presently but if signs or symptoms change and after hours then ED eval.  If [name] negative but pain mild and persisting then could get out patient ct abd/pelivs.     Follow up in 3-4 wks or as needed.

## 2014-04-11 NOTE — Assessment & Plan Note (Signed)
Healthy diet and rx rantidine

## 2014-04-11 NOTE — Assessment & Plan Note (Signed)
Recommend staying on ocp that gyn rx'd.

## 2014-04-11 NOTE — Assessment & Plan Note (Signed)
With recent on and off pain.Hx of pcos. Will get pelvic US. And repeat cbc due to mild increase wbc other day.   If pain worsens or increases then must consider differntial dx of appendicitis. Doubt presently but if signs or symptoms change and after hours then ED eval.  If [name] negative but pain mild and persisting then could get out patient ct abd/pelivs.

## 2014-04-11 NOTE — Assessment & Plan Note (Signed)
Mild spring time. None presently.

## 2014-04-12 ENCOUNTER — Ambulatory Visit (HOSPITAL_BASED_OUTPATIENT_CLINIC_OR_DEPARTMENT_OTHER)
Admission: RE | Admit: 2014-04-12 | Discharge: 2014-04-12 | Disposition: A | Discharge status: Home / Self Care | Source: Ambulatory Visit | Attending: Medical | Admitting: Medical

## 2014-04-12 DIAGNOSIS — R61 Generalized hyperhidrosis: Secondary | ICD-10-CM | POA: Diagnosis not present

## 2014-04-12 DIAGNOSIS — R110 Nausea: Secondary | ICD-10-CM | POA: Diagnosis not present

## 2014-04-12 DIAGNOSIS — R102 Pelvic and perineal pain: Secondary | ICD-10-CM

## 2014-04-12 DIAGNOSIS — E282 Polycystic ovarian syndrome: Secondary | ICD-10-CM

## 2014-04-12 DIAGNOSIS — R6883 Chills (without fever): Secondary | ICD-10-CM | POA: Insufficient documentation

## 2014-04-12 LAB — CBC WITH DIFFERENTIAL/PLATELET
Basophils Absolute: 0.1 10*3/uL (ref 0.0–0.1)
Basophils Relative: 0.5 % (ref 0.0–3.0)
Eosinophils Absolute: 0.2 10*3/uL (ref 0.0–0.7)
Eosinophils Relative: 1.9 % (ref 0.0–5.0)
HCT: 41 % (ref 36.0–49.0)
Hemoglobin: 13.9 g/dL (ref 12.0–16.0)
Lymphocytes Relative: 21.9 % — ABNORMAL LOW (ref 24.0–48.0)
Lymphs Abs: 2.4 10*3/uL (ref 0.7–4.0)
MCHC: 34 g/dL (ref 31.0–37.0)
MCV: 91 fl (ref 78.0–98.0)
Monocytes Absolute: 1 10*3/uL (ref 0.1–1.0)
Monocytes Relative: 9.5 % (ref 3.0–12.0)
Neutro Abs: 7.3 10*3/uL (ref 1.4–7.7)
Neutrophils Relative %: 66.2 % (ref 43.0–71.0)
Platelets: 362 10*3/uL (ref 150.0–575.0)
RBC: 4.51 Mil/uL (ref 3.80–5.70)
RDW: 12.9 % (ref 11.4–15.5)
WBC: 11 10*3/uL (ref 4.5–13.5)

## 2014-06-11 ENCOUNTER — Ambulatory Visit (INDEPENDENT_AMBULATORY_CARE_PROVIDER_SITE_OTHER): Admitting: Medical

## 2014-06-11 ENCOUNTER — Encounter: Payer: Self-pay | Admitting: Medical

## 2014-06-11 ENCOUNTER — Other Ambulatory Visit (HOSPITAL_COMMUNITY)
Admission: RE | Admit: 2014-06-11 | Discharge: 2014-06-11 | Disposition: A | Discharge status: Home / Self Care | Source: Ambulatory Visit | Attending: Medical | Admitting: Medical

## 2014-06-11 VITALS — BP 132/87 | HR 86 | Temp 98.5°F | Ht 68.0 in | Wt 192.2 lb

## 2014-06-11 DIAGNOSIS — J301 Allergic rhinitis due to pollen: Secondary | ICD-10-CM

## 2014-06-11 DIAGNOSIS — R82998 Other abnormal findings in urine: Secondary | ICD-10-CM

## 2014-06-11 DIAGNOSIS — Z113 Encounter for screening for infections with a predominantly sexual mode of transmission: Secondary | ICD-10-CM | POA: Diagnosis not present

## 2014-06-11 DIAGNOSIS — N898 Other specified noninflammatory disorders of vagina: Secondary | ICD-10-CM

## 2014-06-11 DIAGNOSIS — N760 Acute vaginitis: Secondary | ICD-10-CM | POA: Diagnosis present

## 2014-06-11 DIAGNOSIS — F411 Generalized anxiety disorder: Secondary | ICD-10-CM | POA: Diagnosis not present

## 2014-06-11 DIAGNOSIS — N390 Urinary tract infection, site not specified: Secondary | ICD-10-CM

## 2014-06-11 LAB — POCT URINALYSIS DIPSTICK
Bilirubin, UA: NEGATIVE
Blood, UA: NEGATIVE
Glucose, UA: NEGATIVE
Ketones, UA: NEGATIVE
Nitrite, UA: NEGATIVE
Protein, UA: NEGATIVE
Spec Grav, UA: 1.015
Urobilinogen, UA: 0.2
pH, UA: 7

## 2014-06-11 MED ORDER — FLUCONAZOLE 150 MG PO TABS: 150.0000 mg | ORAL_TABLET | Freq: Once | ORAL | Status: DC

## 2014-06-11 MED ORDER — VENLAFAXINE HCL ER 75 MG PO CP24: 75.0000 mg | ORAL_CAPSULE | Freq: Every day | ORAL | Status: DC

## 2014-06-11 NOTE — Patient Instructions (Signed)
Generalized anxiety disorder Not controlled on sertraline. Discontinue. Rx effexor.    Vaginal discharge With discharge. Rx diflucan for yeast infections Urine ancillary studies and culture penidng.   Allergic rhinitis Use claritin and flonase otc. If your ear pain is more constant or severe notify us and would rx antibiotic. Now ear pain can just be pressure in eustachian tube. Recommend holding off on rx antibiotic presently as that would make potential yeast infection worse.     Follow up in 2-3 weeks for wellness exam. Come in fasting.

## 2014-06-11 NOTE — Assessment & Plan Note (Addendum)
Not controlled on sertraline. Discontinue. Rx effexor. May consider adding buspirone in near future.

## 2014-06-11 NOTE — Progress Notes (Signed)
Subjective:    Patient ID: Kathryn [name], female    DOB: Nov 06, 1995, 19 y.o.   MRN: [id number]  HPI   Pt states she has not been helped with sertraline. In the past she has been on prozac and it did not help. She states anxiety comes on randomly. At work only mild anxiety. More described some social anxiety.(moderate to severe). When worse will feel transient chest pressure)  Not reporting depression.  Pt thinks maybe has bv or yeast infection. Some mild white and itchy discharge for 2 days. No antibiotic recently. Girlfriend had yeast infection.  No back pain. No pain on urination.  Pt want [name] to use express scripts when established meds are working.   Some mild allergies recently nasal congestion and runny nose. Occasional faint mild ear pressure.   Review of Systems  Constitutional: Negative for fever, chills, diaphoresis, activity change and fatigue.  HENT: Positive for congestion, ear pain, postnasal drip and rhinorrhea.        Mild transient ear pain.  Respiratory: Negative for cough, chest tightness and shortness of breath.   Cardiovascular: Negative for chest pain, palpitations and leg swelling.  Gastrointestinal: Negative for nausea, vomiting and abdominal pain.  Genitourinary: Positive for vaginal discharge. Negative for dysuria, urgency, frequency, flank pain and pelvic pain.       With itch.  Musculoskeletal: Negative for back pain, neck pain and neck stiffness.  Neurological: Negative for dizziness, tremors, seizures, syncope, facial asymmetry, speech difficulty, weakness, light-headedness, numbness and headaches.  Psychiatric/Behavioral: Negative for suicidal ideas, behavioral problems, confusion, sleep disturbance, self-injury, dysphoric mood, decreased concentration and agitation. The patient is nervous/anxious.      Past Medical History  Diagnosis Date  . Ovarian cyst   . PCOS (polycystic ovarian syndrome)   . Allergy   . Anxiety   . Depression   . GERD  (gastroesophageal reflux disease)     History   Social History  . Marital Status: Single    Spouse Name: N/A  . Number of Children: N/A  . Years of Education: N/A   Occupational History  . Not on file.   Social History Main Topics  . Smoking status: Former Research scientist (life sciences)  . Smokeless tobacco: Current User    Types: Chew  . Alcohol Use: No  . Drug Use: No  . Sexual Activity: Not on file   Other Topics Concern  . Not on file   Social History Narrative    Past Surgical History  Procedure Laterality Date  . Adenoidectomy    . Tympanostomy tube placement    . Adnoids    . Tympanostomy tube placement      Family History  Problem Relation Age of Onset  . Hypertension Mother   . Hyperlipidemia Mother   . Heart disease Mother   . Hyperlipidemia Father     No Known Allergies  Current Outpatient Prescriptions on File Prior to Visit  Medication Sig Dispense Refill  . ibuprofen (ADVIL,MOTRIN) 200 MG tablet Take 200 mg by mouth every 6 (six) hours as needed for moderate pain.    . ranitidine (ZANTAC) 150 MG tablet Take 1 tablet (150 mg total) by mouth 2 (two) times daily. 60 tablet 0   No current facility-administered medications on file prior to visit.    BP 132/87 mmHg  Pulse 86  Temp(Src) 98.5 F (36.9 C) (Oral)  Ht 5\' 8"  (1.727 m)  Wt 192 lb 3.2 oz (87.181 kg)  BMI 29.23 kg/m2  SpO2 99%  LMP  05/11/2014       Objective:   Physical Exam  General  Mental Status - Alert. General Appearance - Well groomed. Not in acute distress.  Skin Rashes- No Rashes.  HEENT Head- Normal. Ear Auditory Canal - Left- Normal. Right - Normal.Tympanic Membrane- Left- minimal faint pink appearance to tm. Right- Normal. Eye Sclera/Conjunctiva- Left- Normal. Right- Normal. Nose & Sinuses Nasal Mucosa- Left-  Boggy and Congested. Right-  Boggy and  Congested. No  maxillary and frontal sinus pressure. Mouth & Throat Lips: Upper Lip- Normal: no dryness, cracking, pallor, cyanosis,  or vesicular eruption. Lower Lip-Normal: no dryness, cracking, pallor, cyanosis or vesicular eruption. Buccal Mucosa- Bilateral- No Aphthous ulcers. Oropharynx- No Discharge or Erythema. +pnd. Tonsils: Characteristics- Bilateral- No Erythema or Congestion. Size/Enlargement- Bilateral- No enlargement. Discharge- bilateral-None.  Neck Neck- Supple. No Masses.   Chest and Lung Exam Auscultation: Breath Sounds:-Clear even and unlabored.  Cardiovascular Auscultation:Rythm- Regular, rate and rhythm. Murmurs & Other Heart Sounds:Ausculatation of the heart reveal- No Murmurs.  Lymphatic Head & Neck General Head & Neck Lymphatics: Bilateral: Description- No Localized lymphadenopathy.      Assessment & Plan:

## 2014-06-11 NOTE — Progress Notes (Signed)
Pre visit review using our clinic review tool, if applicable. No additional management support is needed unless otherwise documented below in the visit note. 

## 2014-06-11 NOTE — Assessment & Plan Note (Signed)
Use claritin and flonase otc. If your ear pain is more constant or severe notify us and would rx antibiotic. Now ear pain can just be pressure in eustachian tube. Recommend holding off on rx antibiotic presently as that would make potential yeast infection worse.

## 2014-06-11 NOTE — Assessment & Plan Note (Signed)
With discharge. Rx diflucan for yeast infections Urine ancillary studies and culture penidng.

## 2014-06-12 LAB — URINE CYTOLOGY ANCILLARY ONLY
Chlamydia: NEGATIVE
Neisseria Gonorrhea: NEGATIVE
Trichomonas: NEGATIVE

## 2014-06-13 ENCOUNTER — Telehealth: Payer: Self-pay | Admitting: Medical

## 2014-06-13 LAB — URINE CULTURE: Colony Count: 9000

## 2014-06-13 LAB — URINE CYTOLOGY ANCILLARY ONLY
Bacterial vaginitis: POSITIVE — AB
Candida vaginitis: NEGATIVE

## 2014-06-13 MED ORDER — METRONIDAZOLE 500 MG PO TABS: 500.0000 mg | ORAL_TABLET | Freq: Three times a day (TID) | ORAL | Status: DC

## 2014-06-14 NOTE — Telephone Encounter (Signed)
Rx flagyl sent

## 2014-07-09 ENCOUNTER — Encounter: Payer: Self-pay | Admitting: Medical

## 2014-07-09 ENCOUNTER — Ambulatory Visit (INDEPENDENT_AMBULATORY_CARE_PROVIDER_SITE_OTHER): Admitting: Medical

## 2014-07-09 VITALS — BP 130/86 | HR 95 | Temp 98.2°F | Wt 194.0 lb

## 2014-07-09 DIAGNOSIS — F411 Generalized anxiety disorder: Secondary | ICD-10-CM | POA: Diagnosis not present

## 2014-07-09 DIAGNOSIS — G4700 Insomnia, unspecified: Secondary | ICD-10-CM | POA: Insufficient documentation

## 2014-07-09 MED ORDER — RANITIDINE HCL 150 MG PO TABS: 150.0000 mg | ORAL_TABLET | Freq: Two times a day (BID) | ORAL | Status: DC

## 2014-07-09 MED ORDER — HYDROXYZINE HCL 25 MG PO TABS: ORAL_TABLET | ORAL | Status: DC

## 2014-07-09 MED ORDER — VENLAFAXINE HCL ER 150 MG PO CP24: 150.0000 mg | ORAL_CAPSULE | Freq: Every day | ORAL | Status: DC

## 2014-07-09 NOTE — Patient Instructions (Addendum)
Generalized anxiety disorder Much improved. Will increase her dose at her request. About 2 weeks into tx pt thought she could use stronger dose.    Insomnia At end of exam noted some trouble sleeping at times. Will rx hydroxzine. To see if this helps. Discussed this recent insomina may be effexor related.  But will follow closely. Pt did not do well with only ssri in past. Effexor working better overall.   Did rx higher dose of Effexor.  Follow up in 1 month or as needed.   At end of exam noted some trouble sleeping at times. Will rx hydroxyzine. To see if this helps. Discussed this recent insomnia may be Effexor related.  Pt advised if any adverse effects with mood while taking effexor notify us immediatley. Pt agrees she will stop med and update [name] immediately if such were to occure.

## 2014-07-09 NOTE — Assessment & Plan Note (Signed)
At end of exam noted some trouble sleeping at times. Will rx hydroxzine. To see if this helps. Discussed this recent insomina may be effexor related.  But will follow closely. Pt did not do well with only ssri in past. Effexor working better overall.

## 2014-07-09 NOTE — Progress Notes (Signed)
Subjective:    Patient ID: Kathryn [name], female    DOB: 07-15-1995, 19 y.o.   MRN: [id number]  HPI   Pt in states significantly improved with effexor. Pt has been on med for about 1 month. She would like stronger dose.   Mood is improved and that was not primary issue. Her primary issue was anxiety. Anxiety about 70-80% better.     Review of Systems  Constitutional: Negative for fever, chills, diaphoresis, activity change and fatigue.  Respiratory: Negative for cough, chest tightness and shortness of breath.   Cardiovascular: Negative for chest pain, palpitations and leg swelling.  Gastrointestinal: Negative for nausea, vomiting and abdominal pain.  Musculoskeletal: Negative for neck pain and neck stiffness.  Neurological: Negative for dizziness, tremors, seizures, syncope, facial asymmetry, speech difficulty, weakness, light-headedness, numbness and headaches.  Psychiatric/Behavioral: Positive for sleep disturbance. Negative for behavioral problems, confusion and agitation. The patient is nervous/anxious.        About 70-80% anxiety  better. Also her mood is better.  Insomnia some since starting effexor.     Past Medical History  Diagnosis Date  . Ovarian cyst   . PCOS (polycystic ovarian syndrome)   . Allergy   . Anxiety   . Depression   . GERD (gastroesophageal reflux disease)     History   Social History  . Marital Status: Single    Spouse Name: N/A  . Number of Children: N/A  . Years of Education: N/A   Occupational History  . Not on file.   Social History Main Topics  . Smoking status: Former Research scientist (life sciences)  . Smokeless tobacco: Current User    Types: Chew  . Alcohol Use: No  . Drug Use: No  . Sexual Activity: Not on file   Other Topics Concern  . Not on file   Social History Narrative    Past Surgical History  Procedure Laterality Date  . Adenoidectomy    . Tympanostomy tube placement    . Adnoids    . Tympanostomy tube placement      Family  History  Problem Relation Age of Onset  . Hypertension Mother   . Hyperlipidemia Mother   . Heart disease Mother   . Hyperlipidemia Father     No Known Allergies  Current Outpatient Prescriptions on File Prior to Visit  Medication Sig Dispense Refill  . ibuprofen (ADVIL,MOTRIN) 200 MG tablet Take 200 mg by mouth every 6 (six) hours as needed for moderate pain.    . ranitidine (ZANTAC) 150 MG tablet Take 1 tablet (150 mg total) by mouth 2 (two) times daily. 60 tablet 0  . venlafaxine XR (EFFEXOR XR) 75 MG 24 hr capsule Take 1 capsule (75 mg total) by mouth daily with breakfast. 30 capsule 0   No current facility-administered medications on file prior to visit.    BP 130/86 mmHg  Pulse 95  Temp(Src) 98.2 F (36.8 C) (Oral)  Wt 194 lb (87.998 kg)  SpO2 99%  LMP 06/09/2014       Objective:   Physical Exam  General Mental Status- Alert. General Appearance- Not in acute distress.   Skin General: Color- Normal Color. Moisture- Normal Moisture.  Neck Carotid Arteries- Normal color. Moisture- Normal Moisture. No carotid bruits. No JVD.  Chest and Lung Exam Auscultation: Breath Sounds:-Normal.  Cardiovascular Auscultation:Rythm- Regular. Murmurs & Other Heart Sounds:Auscultation of the heart reveals- No Murmurs.    Neurologic Cranial Nerve exam:- CN III-XII intact(No nystagmus), symmetric smile. Strength:- 5/5 equal and symmetric  strength both upper and lower extremities.      Assessment & Plan:

## 2014-07-09 NOTE — Assessment & Plan Note (Signed)
Much improved. Will increase her dose at her request. About 2 weeks into tx pt thought she could use stronger dose.

## 2014-08-01 ENCOUNTER — Ambulatory Visit (INDEPENDENT_AMBULATORY_CARE_PROVIDER_SITE_OTHER): Admitting: Medical

## 2014-08-01 ENCOUNTER — Encounter: Payer: Self-pay | Admitting: Medical

## 2014-08-01 ENCOUNTER — Ambulatory Visit: Admitting: Internal Medicine

## 2014-08-01 VITALS — BP 126/78 | HR 93 | Temp 98.6°F | Ht 68.0 in | Wt 191.8 lb

## 2014-08-01 DIAGNOSIS — H6502 Acute serous otitis media, left ear: Secondary | ICD-10-CM | POA: Diagnosis not present

## 2014-08-01 DIAGNOSIS — H6690 Otitis media, unspecified, unspecified ear: Secondary | ICD-10-CM | POA: Insufficient documentation

## 2014-08-01 MED ORDER — FLUTICASONE PROPIONATE 50 MCG/ACT NA SUSP: 2.0000 | Freq: Every day | NASAL | Status: DC

## 2014-08-01 MED ORDER — VENLAFAXINE HCL ER 150 MG PO CP24: 150.0000 mg | ORAL_CAPSULE | Freq: Every day | ORAL | Status: DC

## 2014-08-01 MED ORDER — BENZONATATE 100 MG PO CAPS: 100.0000 mg | ORAL_CAPSULE | Freq: Three times a day (TID) | ORAL | Status: DC | PRN

## 2014-08-01 MED ORDER — CEFDINIR 300 MG PO CAPS: 300.0000 mg | ORAL_CAPSULE | Freq: Two times a day (BID) | ORAL | Status: DC

## 2014-08-01 NOTE — Progress Notes (Signed)
Pre visit review using our clinic review tool, if applicable. No additional management support is needed unless otherwise documented below in the visit note. 

## 2014-08-01 NOTE — Assessment & Plan Note (Signed)
Maybe related to congestion from allergic rhinitis. Rx flonase and cefdinir. Benzonate if her cough worsens.

## 2014-08-01 NOTE — Patient Instructions (Addendum)
For your nasal congestion I will rx flonase. For your left ear infection start cefdnir. For your cough, I prescribed benzonatate.  Follow u pin 7 days or as needed

## 2014-08-01 NOTE — Progress Notes (Signed)
   Subjective:    Patient ID: Kathryn [name], female    DOB: Aug 07, 1995, 19 y.o.   MRN: [id number]  HPI  Pt in with some nasal congestion, ha, some frontal sinus pressure, st and ear pain. Symptoms onset last 2-3 days  But she down plays frontal sinus pressure. Lt ear pain is worse. Rt side pain in ear is less. Constant mild st.   Pt did have fever yesterday of 102. No fever today.   Pt denies sneezing or itchy eyes.     Review of Systems  Constitutional: Positive for fever. Negative for chills and fatigue.  HENT: Positive for congestion, ear pain and sinus pressure. Negative for nosebleeds, postnasal drip and sore throat.   Respiratory: Negative for cough, chest tightness and wheezing.   Cardiovascular: Negative for chest pain and palpitations.  Gastrointestinal: Negative for abdominal pain.       Pt did not report any diarrhea of GI symptoms to me. Although on closing chart it was under reason for visit???  Musculoskeletal: Negative for back pain.  Neurological: Positive for headaches. Negative for dizziness, syncope, speech difficulty, weakness, light-headedness and numbness.       More yesterday and in frontal sinus area.  Hematological: Negative for adenopathy. Does not bruise/bleed easily.  Psychiatric/Behavioral: Negative for behavioral problems, confusion and dysphoric mood. The patient is not nervous/anxious.        Objective:   Physical Exam  General  Mental Status - Alert. General Appearance - Well groomed. Not in acute distress.  Skin Rashes- No Rashes.  HEENT Head- Normal. Ear Auditory Canal - Left- Normal. Right - Normal.Tympanic Membrane- Left- mild dull red.. Right- Normal. Eye Sclera/Conjunctiva- Left- Normal. Right- Normal. Nose & Sinuses Nasal Mucosa- Left-  Boggy and Congested. Right-  Boggy and  Congested.Bilateral no  maxillary or  frontal sinus pressure presently. Mouth & Throat Lips: Upper Lip- Normal: no dryness, cracking, pallor, cyanosis, or  vesicular eruption. Lower Lip-Normal: no dryness, cracking, pallor, cyanosis or vesicular eruption. Buccal Mucosa- Bilateral- No Aphthous ulcers. Oropharynx- No Discharge or Erythema. Tonsils: Characteristics- Bilateral- Mild Erythema or Congestion. Size/Enlargement- Bilateral- 1+ enlargement. Discharge- bilateral-None.  Neck Neck- Supple. No Masses. Faint mild shoddy submandibular nodes but not tender.   Chest and Lung Exam Auscultation: Breath Sounds:-Clear even and unlabored.  Cardiovascular Auscultation:Rythm- Regular, rate and rhythm. Murmurs & Other Heart Sounds:Ausculatation of the heart reveal- No Murmurs.  Lymphatic Head & Neck General Head & Neck Lymphatics: Bilateral: Description- No Localized lymphadenopathy.       Assessment & Plan:

## 2014-09-03 ENCOUNTER — Telehealth: Payer: Self-pay | Admitting: Medical

## 2014-09-03 ENCOUNTER — Other Ambulatory Visit: Payer: Self-pay

## 2014-09-03 MED ORDER — VENLAFAXINE HCL ER 150 MG PO CP24: 150.0000 mg | ORAL_CAPSULE | Freq: Every day | ORAL | Status: DC

## 2014-09-03 NOTE — Telephone Encounter (Signed)
Relation to pt: self  Call back number: [phone] [facility]: [facility] [address] [place], [place], [place] [address]  Phone: [phone]   Reason for call:  Patient requesting a refill venlafaxine XR (EFFEXOR XR) 150 MG 24 hr capsule. Patient requesting RX please send to [facility]

## 2014-09-04 NOTE — Telephone Encounter (Signed)
Called patient left message on answering machine regarding Rx being filled.

## 2014-09-13 ENCOUNTER — Encounter: Payer: Self-pay | Admitting: Medical

## 2014-09-13 ENCOUNTER — Ambulatory Visit (INDEPENDENT_AMBULATORY_CARE_PROVIDER_SITE_OTHER): Admitting: Medical

## 2014-09-13 ENCOUNTER — Telehealth: Payer: Self-pay | Admitting: Medical

## 2014-09-13 ENCOUNTER — Other Ambulatory Visit: Payer: Self-pay

## 2014-09-13 VITALS — BP 130/87 | HR 86 | Temp 98.8°F | Ht 68.0 in | Wt 207.0 lb

## 2014-09-13 DIAGNOSIS — F411 Generalized anxiety disorder: Secondary | ICD-10-CM | POA: Diagnosis not present

## 2014-09-13 DIAGNOSIS — R5383 Other fatigue: Secondary | ICD-10-CM

## 2014-09-13 DIAGNOSIS — G4700 Insomnia, unspecified: Secondary | ICD-10-CM | POA: Diagnosis not present

## 2014-09-13 MED ORDER — TRAZODONE HCL 50 MG PO TABS: 50.0000 mg | ORAL_TABLET | Freq: Every evening | ORAL | Status: DC | PRN

## 2014-09-13 MED ORDER — IBUPROFEN 200 MG PO TABS: 200.0000 mg | ORAL_TABLET | Freq: Four times a day (QID) | ORAL | Status: DC | PRN

## 2014-09-13 MED ORDER — VENLAFAXINE HCL ER 150 MG PO CP24: 150.0000 mg | ORAL_CAPSULE | Freq: Every day | ORAL | Status: DC

## 2014-09-13 MED ORDER — RANITIDINE HCL 150 MG PO TABS: 150.0000 mg | ORAL_TABLET | Freq: Two times a day (BID) | ORAL | Status: DC

## 2014-09-13 NOTE — Telephone Encounter (Signed)
Ask lpn to refill pt zantac and ibuprofen.

## 2014-09-13 NOTE — Telephone Encounter (Signed)
Refills sent to [facility] for approval.

## 2014-09-13 NOTE — Patient Instructions (Addendum)
Anxiety with some depression.Doing well but with lack of motivation.  Will continue the effexor. Since you have tried other types and did not do well. Hesitant to make changes. With your age less than 20 yo would like psychiatrist opinion if different combination of meds appropiate or newer med. Use psychiatrist form to make referral be seen by other. If difficulty getting in please let me know.  For insomnia stop hydroxyzine. Start trazodone.   For fatigue will get cbc, cmp and tsh.  Follow up in one month or as needed

## 2014-09-13 NOTE — Telephone Encounter (Signed)
Got message states pt called 4 hours ago. No info. Will send nurse a note asking her to update me or call pt tomorrow before the weekend?

## 2014-09-13 NOTE — Progress Notes (Signed)
Subjective:    Patient ID: Kathryn [name], female    DOB: October 19, 1995, 19 y.o.   MRN: [id number]  HPI  Pt in for follow up. Her mood feels better overall with effexor.Best she felt compred to other meds. However states just no motivation.(Also some fatigue) Pt in past tried zoloft and prozac. Both zoloft and  prozac made her symptoms worse.  Has some mixed anxiety and depression.  Pt is seeing a Social worker. Pt has been twice and seems to help.  Insomnia.- hydroxyzine did not work well. She would wake up each night. Also some night mares.    Review of Systems  Constitutional: Positive for fatigue. Negative for fever and chills.  Respiratory: Negative for cough and chest tightness.   Cardiovascular: Negative for chest pain and palpitations.  Gastrointestinal: Negative for abdominal pain.  Neurological: Negative for dizziness and headaches.  Hematological: Negative for adenopathy. Does not bruise/bleed easily.  Psychiatric/Behavioral: Negative for suicidal ideas, confusion, dysphoric mood and agitation. The patient is not nervous/anxious.        No motivation.    Past Medical History  Diagnosis Date  . Ovarian cyst   . PCOS (polycystic ovarian syndrome)   . Allergy   . Anxiety   . Depression   . GERD (gastroesophageal reflux disease)     History   Social History  . Marital Status: Single    Spouse Name: N/A  . Number of Children: N/A  . Years of Education: N/A   Occupational History  . Not on file.   Social History Main Topics  . Smoking status: Former Research scientist (life sciences)  . Smokeless tobacco: Current User    Types: Chew  . Alcohol Use: No  . Drug Use: No  . Sexual Activity: Not on file   Other Topics Concern  . Not on file   Social History Narrative    Past Surgical History  Procedure Laterality Date  . Adenoidectomy    . Tympanostomy tube placement    . Adnoids    . Tympanostomy tube placement      Family History  Problem Relation Age of Onset  . Hypertension  Mother   . Hyperlipidemia Mother   . Heart disease Mother   . Hyperlipidemia Father     No Known Allergies  Current Outpatient Prescriptions on File Prior to Visit  Medication Sig Dispense Refill  . fluticasone (FLONASE) 50 MCG/ACT nasal spray Place 2 sprays into both nostrils daily. 16 g 0  . hydrOXYzine (ATARAX/VISTARIL) 25 MG tablet 1 tab po q  Hs prn isomnia 30 tablet 0  . ibuprofen (ADVIL,MOTRIN) 200 MG tablet Take 200 mg by mouth every 6 (six) hours as needed for moderate pain.    . ranitidine (ZANTAC) 150 MG tablet Take 1 tablet (150 mg total) by mouth 2 (two) times daily. 180 tablet 1  . venlafaxine XR (EFFEXOR XR) 150 MG 24 hr capsule Take 1 capsule (150 mg total) by mouth daily with breakfast. 30 capsule 0   No current facility-administered medications on file prior to visit.    BP 130/87 mmHg  Pulse 86  Temp(Src) 98.8 F (37.1 C) (Oral)  Ht 5\' 8"  (1.727 m)  Wt 207 lb (93.895 kg)  BMI 31.48 kg/m2  SpO2 98%  LMP 08/14/2014       Objective:   Physical Exam   General Mental Status- Alert. General Appearance- Not in acute distress.   Skin General: Color- Normal Color. Moisture- Normal Moisture.  Neck Carotid Arteries- Normal color. Moisture-  Normal Moisture. No carotid bruits. No JVD.  Chest and Lung Exam Auscultation: Breath Sounds:-Normal. CTA  Cardiovascular Auscultation:Rythm- Regular, Rate and Rythm Murmurs & Other Heart Sounds:Auscultation of the heart reveals- No Murmurs.   Neurologic Cranial Nerve exam:- CN III-XII intact(No nystagmus), symmetric smile. Strength:- 5/5 equal and symmetric strength both upper and lower extremities.     Assessment & Plan:  Anxiety with some depression.Doing well but with lack of motivation.  Will continue the effexor. Since you have tried other types and did not do well. Hesitant to make changes. With your age less than 65 yo would like psychiatrist opinion if different combination of meds appropiate or newer med.  Use psychiatrist form to make referral be seen by other. If difficulty getting in please let me know.  For insomnia stop hydroxyzine. Start trazodone.   For fatigue will get cbc, cmp and tsh.  Follow up in one month or as needed

## 2014-09-14 LAB — CBC WITH DIFFERENTIAL/PLATELET
Basophils Absolute: 0 10*3/uL (ref 0.0–0.1)
Basophils Relative: 0.3 % (ref 0.0–3.0)
Eosinophils Absolute: 0.3 10*3/uL (ref 0.0–0.7)
Eosinophils Relative: 3.8 % (ref 0.0–5.0)
HCT: 39.7 % (ref 36.0–49.0)
Hemoglobin: 13.4 g/dL (ref 12.0–16.0)
Lymphocytes Relative: 21.3 % — ABNORMAL LOW (ref 24.0–48.0)
Lymphs Abs: 1.7 10*3/uL (ref 0.7–4.0)
MCHC: 33.7 g/dL (ref 31.0–37.0)
MCV: 92 fl (ref 78.0–98.0)
Monocytes Absolute: 0.9 10*3/uL (ref 0.1–1.0)
Monocytes Relative: 11.6 % (ref 3.0–12.0)
Neutro Abs: 5.1 10*3/uL (ref 1.4–7.7)
Neutrophils Relative %: 63 % (ref 43.0–71.0)
Platelets: 352 10*3/uL (ref 150.0–575.0)
RBC: 4.31 Mil/uL (ref 3.80–5.70)
RDW: 12.7 % (ref 11.4–15.5)
WBC: 8.1 10*3/uL (ref 4.5–13.5)

## 2014-09-14 LAB — COMPREHENSIVE METABOLIC PANEL
ALT: 76 U/L — ABNORMAL HIGH (ref 0–35)
AST: 45 U/L — ABNORMAL HIGH (ref 0–37)
Albumin: 4.2 g/dL (ref 3.5–5.2)
Alkaline Phosphatase: 61 U/L (ref 47–119)
BUN: 11 mg/dL (ref 6–23)
CO2: 27 mEq/L (ref 19–32)
Calcium: 9.1 mg/dL (ref 8.4–10.5)
Chloride: 105 mEq/L (ref 96–112)
Creatinine, Ser: 0.85 mg/dL (ref 0.40–1.20)
GFR: 90.93 mL/min (ref >60.00)
Glucose, Bld: 81 mg/dL (ref 70–99)
Potassium: 4.1 mEq/L (ref 3.5–5.1)
Sodium: 139 mEq/L (ref 135–145)
Total Bilirubin: 0.3 mg/dL (ref 0.2–1.2)
Total Protein: 6.9 g/dL (ref 6.0–8.3)

## 2014-09-14 LAB — TSH: TSH: 3.74 u[IU]/mL (ref 0.40–5.00)

## 2014-09-14 NOTE — Telephone Encounter (Signed)
Called patient states she called back regarding Motrin. Says she was given/called on 200mg  and she takes 800 mg. Advised that is dosage we have in our system.

## 2014-09-16 MED ORDER — IBUPROFEN 800 MG PO TABS: 800.0000 mg | ORAL_TABLET | Freq: Three times a day (TID) | ORAL | Status: DC | PRN

## 2014-09-16 NOTE — Telephone Encounter (Signed)
Sent in higher dose of motrin as she requested.

## 2014-09-24 ENCOUNTER — Telehealth: Payer: Self-pay | Admitting: Medical

## 2014-09-24 NOTE — Telephone Encounter (Signed)
Relation to pt: self Call back number:[phone]   Reason for call:  Patient inquiring about lab results

## 2014-09-24 NOTE — Telephone Encounter (Signed)
Patient called  Today regarding lab results. Advised copy of labs mailed out to her today after not getting return call when I left message on answering machine. . Reviewed labs with patient.

## 2014-09-25 ENCOUNTER — Encounter: Payer: Self-pay | Admitting: Family

## 2014-09-25 ENCOUNTER — Ambulatory Visit (INDEPENDENT_AMBULATORY_CARE_PROVIDER_SITE_OTHER): Admitting: Family

## 2014-09-25 VITALS — BP 108/70 | HR 124 | Temp 98.4°F | Resp 16 | Ht 68.0 in | Wt 214.6 lb

## 2014-09-25 DIAGNOSIS — J069 Acute upper respiratory infection, unspecified: Secondary | ICD-10-CM

## 2014-09-25 DIAGNOSIS — J029 Acute pharyngitis, unspecified: Secondary | ICD-10-CM

## 2014-09-25 LAB — POCT RAPID STREP A: Rapid Strep A Screen: NEGATIVE

## 2014-09-25 MED ORDER — VALACYCLOVIR HCL 1 G PO TABS: ORAL_TABLET | ORAL | Status: DC

## 2014-09-25 NOTE — Patient Instructions (Signed)
Continue zyrtec 10mg  once daily. Add Sudafed 60mg  every 6 hours for the next few days. Rinse nose twice daily with nasal saline spray. Gargle twice daily with salt water. You may use ibuprofen as needed for pain. Call if symptoms worsen, if fever >101 or if not improved in 3 days.

## 2014-09-25 NOTE — Progress Notes (Signed)
Subjective:    Patient ID: Kathryn [name], female    DOB: 04-Mar-1995, 19 y.o.   MRN: [id number]  HPI  Kathryn [name] is a 19 yr old female who presents today with complaint of left ear pain x 5 days.  She reports 3 days of throat pain. + nasal congestion.  Using zyrtec without much improvement. Also tried mucinex.  She also reprts lower lip cold sore.    Review of Systems See HPI  Past Medical History  Diagnosis Date  . Ovarian cyst   . PCOS (polycystic ovarian syndrome)   . Allergy   . Anxiety   . Depression   . GERD (gastroesophageal reflux disease)     History   Social History  . Marital Status: Single    Spouse Name: N/A  . Number of Children: N/A  . Years of Education: N/A   Occupational History  . Not on file.   Social History Main Topics  . Smoking status: Former Research scientist (life sciences)  . Smokeless tobacco: Current User    Types: Chew  . Alcohol Use: No  . Drug Use: No  . Sexual Activity: Not on file   Other Topics Concern  . Not on file   Social History Narrative    Past Surgical History  Procedure Laterality Date  . Adenoidectomy    . Tympanostomy tube placement    . Adnoids    . Tympanostomy tube placement      Family History  Problem Relation Age of Onset  . Hypertension Mother   . Hyperlipidemia Mother   . Heart disease Mother   . Hyperlipidemia Father     No Known Allergies  Current Outpatient Prescriptions on File Prior to Visit  Medication Sig Dispense Refill  . ibuprofen (ADVIL,MOTRIN) 800 MG tablet Take 1 tablet (800 mg total) by mouth every 8 (eight) hours as needed. 30 tablet 1  . ranitidine (ZANTAC) 150 MG tablet Take 1 tablet (150 mg total) by mouth 2 (two) times daily. 180 tablet 1  . traZODone (DESYREL) 50 MG tablet Take 1 tablet (50 mg total) by mouth at bedtime as needed for sleep. 30 tablet 1  . venlafaxine XR (EFFEXOR XR) 150 MG 24 hr capsule Take 1 capsule (150 mg total) by mouth daily with breakfast. 30 capsule 1   No current  facility-administered medications on file prior to visit.    BP 108/70 mmHg  Pulse 124  Temp(Src) 98.4 F (36.9 C) (Oral)  Resp 16  Ht 5\' 8"  (1.727 m)  Wt 214 lb 9.6 oz (97.342 kg)  BMI 32.64 kg/m2  SpO2 98%  LMP 09/18/2014       Objective:   Physical Exam  Constitutional: She appears well-developed and well-nourished.  HENT:  Head: Normocephalic and atraumatic.  Right Ear: Tympanic membrane and ear canal normal.  Left Ear: Tympanic membrane and ear canal normal.  Nose: Right sinus exhibits no maxillary sinus tenderness and no frontal sinus tenderness. Left sinus exhibits no maxillary sinus tenderness and no frontal sinus tenderness.  Mouth/Throat: No oropharyngeal exudate, posterior oropharyngeal edema or posterior oropharyngeal erythema.  Eyes: Left eye exhibits no discharge.  Neck: Neck supple.  Cardiovascular: Normal rate, regular rhythm and normal heart sounds.   No murmur heard. Pulmonary/Chest: Effort normal and breath sounds normal. No respiratory distress. She has no wheezes.  Lymphadenopathy:    She has no cervical adenopathy.  Psychiatric: She has a normal mood and affect. Her behavior is normal. Judgment and thought content normal.  Assessment & Plan:  Symptoms most consistent with viral etiology. Rapid strep negative.  Advised the following:  Continue zyrtec 10mg  once daily. Add Sudafed 60mg  every 6 hours for the next few days. Rinse nose twice daily with nasal saline spray. Gargle twice daily with salt water. You may use ibuprofen as needed for pain. Call if symptoms worsen, if fever >101 or if not improved in 3 days.

## 2014-09-25 NOTE — Progress Notes (Signed)
Pre visit review using our clinic review tool, if applicable. No additional management support is needed unless otherwise documented below in the visit note. 

## 2014-10-30 ENCOUNTER — Ambulatory Visit

## 2014-12-11 ENCOUNTER — Telehealth: Payer: Self-pay | Admitting: Medical

## 2014-12-11 NOTE — Telephone Encounter (Signed)
Caller name: Sunshyne Horvath   Relationship to patient: Self   Can be reached: [phone]  Pharmacy: [facility], [place] [address]  Reason for call: pt need a refill on her [facility] Rx.

## 2014-12-12 MED ORDER — VENLAFAXINE HCL ER 150 MG PO CP24: 150.0000 mg | ORAL_CAPSULE | Freq: Every day | ORAL | Status: DC

## 2014-12-12 NOTE — Addendum Note (Signed)
Addended by: Tasia Catchings on: 12/12/2014 11:20 AM   Modules accepted: Orders

## 2014-12-12 NOTE — Telephone Encounter (Signed)
Pt last seen 09/25/14. Last refill was 09/13/14. Please advise on refill.

## 2014-12-12 NOTE — Telephone Encounter (Signed)
Per his last office visit -- Kathryn [name] instructed for patient to return in one month for follow-up. This has not happened. I am willing to allow 30-day supply with 0 refills in Kathryn [name]'s absence but Kathryn [name] needs to schedule a follow-up with him.

## 2014-12-12 NOTE — Telephone Encounter (Signed)
Spoke with pt and she voices understanding. Pt is willing to schedule an appointment before next med refill runs out.

## 2014-12-18 ENCOUNTER — Ambulatory Visit (INDEPENDENT_AMBULATORY_CARE_PROVIDER_SITE_OTHER): Admitting: Family

## 2014-12-18 ENCOUNTER — Other Ambulatory Visit (HOSPITAL_COMMUNITY)
Admission: RE | Admit: 2014-12-18 | Discharge: 2014-12-18 | Disposition: A | Discharge status: Home / Self Care | Source: Ambulatory Visit | Attending: Family | Admitting: Family

## 2014-12-18 ENCOUNTER — Encounter: Payer: Self-pay | Admitting: Family

## 2014-12-18 VITALS — BP 135/81 | HR 87 | Temp 98.5°F | Resp 18 | Ht 68.0 in | Wt 230.0 lb

## 2014-12-18 DIAGNOSIS — N760 Acute vaginitis: Secondary | ICD-10-CM | POA: Insufficient documentation

## 2014-12-18 DIAGNOSIS — M25571 Pain in right ankle and joints of right foot: Secondary | ICD-10-CM

## 2014-12-18 MED ORDER — MELOXICAM 7.5 MG PO TABS: 7.5000 mg | ORAL_TABLET | Freq: Every day | ORAL | Status: DC

## 2014-12-18 NOTE — Patient Instructions (Signed)
Start meloxicam (anti-inflammatory) once daily. Please complete lab work prior to leaving. We will contact you with your results and further recommendations.

## 2014-12-18 NOTE — Progress Notes (Signed)
Subjective:    Patient ID: Kathryn [name], female    DOB: 1995/08/08, 19 y.o.   MRN: [id number]  HPI  Kathryn [name] is a 19 yr old female who presents today with two concerns:  Pt reports + vaginal discharge- white/thick. No odor.   R foot pain- reports + hx of plantar fascitis.  Now worsening.  Now hurting on the top of the foot. Denies known injury. Has been taking motrin which helps.    Review of Systems    see HPI  Past Medical History  Diagnosis Date  . Ovarian cyst   . PCOS (polycystic ovarian syndrome)   . Allergy   . Anxiety   . Depression   . GERD (gastroesophageal reflux disease)     Social History   Social History  . Marital Status: Single    Spouse Name: N/A  . Number of Children: N/A  . Years of Education: N/A   Occupational History  . Not on file.   Social History Main Topics  . Smoking status: Current Every Day Smoker -- 1.00 packs/day    Types: Cigarettes  . Smokeless tobacco: Former Systems developer    Types: Chew  . Alcohol Use: No  . Drug Use: No  . Sexual Activity: Not on file   Other Topics Concern  . Not on file   Social History Narrative    Past Surgical History  Procedure Laterality Date  . Adenoidectomy    . Tympanostomy tube placement    . Adnoids    . Tympanostomy tube placement      Family History  Problem Relation Age of Onset  . Hypertension Mother   . Hyperlipidemia Mother   . Heart disease Mother   . Hyperlipidemia Father     Allergies  Allergen Reactions  . Trazodone And Nefazodone     Headache , "feels hung over"    Current Outpatient Prescriptions on File Prior to Visit  Medication Sig Dispense Refill  . cetirizine (ZYRTEC) 10 MG tablet Take 10 mg by mouth daily.    Marland Kitchen ibuprofen (ADVIL,MOTRIN) 800 MG tablet Take 1 tablet (800 mg total) by mouth every 8 (eight) hours as needed. 30 tablet 1  . ranitidine (ZANTAC) 150 MG tablet Take 1 tablet (150 mg total) by mouth 2 (two) times daily. 180 tablet 1  . valACYclovir  (VALTREX) 1000 MG tablet 2 tab by mouth in AM and 2 tabs by mouth 12 hrs later at start of cold sore 20 tablet 0  . venlafaxine XR (EFFEXOR XR) 150 MG 24 hr capsule Take 1 capsule (150 mg total) by mouth daily with breakfast. 30 capsule 0   No current facility-administered medications on file prior to visit.    BP 135/81 mmHg  Pulse 87  Temp(Src) 98.5 F (36.9 C) (Oral)  Resp 18  Ht 5\' 8"  (1.727 m)  Wt 230 lb (104.327 kg)  BMI 34.98 kg/m2  SpO2 100%  LMP 11/17/2014    Objective:   Physical Exam  Constitutional: She is oriented to person, place, and time. She appears well-developed and well-nourished. No distress.  Genitourinary: There is no rash on the right labia. There is no rash on the left labia. No vaginal discharge found.  Musculoskeletal:  Right foot tender to palpation overlying right 5th metatarsal, no erythema or swelling  Neurological: She is alert and oriented to person, place, and time.  Skin: Skin is warm and dry.  Psychiatric: She has a normal mood and affect. Her behavior is normal.  Judgment and thought content normal.          Assessment & Plan:  Right foot pain-  Will rx with meloxicam, obtain uric acid level to assess for gout.  If symptoms worsen or do not improve, consider referral to podiatry.  Vaginitis- wet prep obtained today- await results.

## 2014-12-18 NOTE — Addendum Note (Signed)
Addended by: Kelle Darting A on: 12/18/2014 03:51 PM   Modules accepted: Orders

## 2014-12-19 ENCOUNTER — Encounter: Payer: Self-pay | Admitting: Medical

## 2014-12-19 ENCOUNTER — Ambulatory Visit (INDEPENDENT_AMBULATORY_CARE_PROVIDER_SITE_OTHER): Admitting: Medical

## 2014-12-19 ENCOUNTER — Ambulatory Visit (HOSPITAL_BASED_OUTPATIENT_CLINIC_OR_DEPARTMENT_OTHER)
Admission: RE | Admit: 2014-12-19 | Discharge: 2014-12-19 | Disposition: A | Discharge status: Home / Self Care | Source: Ambulatory Visit | Attending: Medical | Admitting: Medical

## 2014-12-19 ENCOUNTER — Telehealth: Payer: Self-pay | Admitting: *Deleted

## 2014-12-19 VITALS — BP 118/80 | HR 92 | Temp 98.6°F | Ht 68.0 in | Wt 234.0 lb

## 2014-12-19 DIAGNOSIS — M25571 Pain in right ankle and joints of right foot: Secondary | ICD-10-CM | POA: Diagnosis not present

## 2014-12-19 DIAGNOSIS — M25579 Pain in unspecified ankle and joints of unspecified foot: Secondary | ICD-10-CM | POA: Insufficient documentation

## 2014-12-19 DIAGNOSIS — F4323 Adjustment disorder with mixed anxiety and depressed mood: Secondary | ICD-10-CM

## 2014-12-19 DIAGNOSIS — M79671 Pain in right foot: Secondary | ICD-10-CM | POA: Diagnosis not present

## 2014-12-19 LAB — URIC ACID: Uric Acid, Serum: 4.9 mg/dL (ref 2.4–7.0)

## 2014-12-19 MED ORDER — VENLAFAXINE HCL ER 150 MG PO CP24: 150.0000 mg | ORAL_CAPSULE | Freq: Every day | ORAL | Status: DC

## 2014-12-19 MED ORDER — ZOLPIDEM TARTRATE 5 MG PO TABS: 5.0000 mg | ORAL_TABLET | Freq: Every evening | ORAL | Status: DC | PRN

## 2014-12-19 NOTE — Telephone Encounter (Signed)
Spoke with pt about uric acid result, she asked about wet prep and xray result. Advised pt that wet prep is still pending and foot xray is normal.

## 2014-12-19 NOTE — Progress Notes (Signed)
Pre visit review using our clinic review tool, if applicable. No additional management support is needed unless otherwise documented below in the visit note. 

## 2014-12-19 NOTE — Patient Instructions (Addendum)
Doing well with anxiety so will continue the effexor.  For you insomnia I will rx low dose ambien. I think this may be good option but I want you to use as least as possible. Maybe every 3 rd or 4 th night if not getting adequate sleep.  Follow up in 3 months or as needed.  Update me on how you are doing in about 3 wks regarding sleep and frequency of ambien use.  Will get rt foot xray today as follow up from yesterday visit. Continue meloxicam

## 2014-12-19 NOTE — Progress Notes (Signed)
Subjective:    Patient ID: Kathryn [name], female    DOB: 03/15/1995, 19 y.o.   MRN: [id number]  HPI  Pt states she feels well with her mood. Pt states her anxiety is unde control better with effexor. She states she still sometimes has insomnia. Trazodone gave hang over effect(so she stopped)t. Pt tried melatonin and she sleeps some better.      Review of Systems  Constitutional: Negative for fever, chills and fatigue.  HENT: Negative for congestion and ear pain.   Respiratory: Negative for cough, chest tightness, shortness of breath and wheezing.   Cardiovascular: Negative for chest pain and palpitations.  Gastrointestinal: Negative for abdominal pain and blood in stool.  Musculoskeletal: Negative for back pain.  Neurological: Negative for dizziness, seizures, speech difficulty, weakness, light-headedness and headaches.  Hematological: Negative for adenopathy. Does not bruise/bleed easily.  Psychiatric/Behavioral: Positive for sleep disturbance. Negative for suicidal ideas, behavioral problems, confusion, self-injury and dysphoric mood. The patient is nervous/anxious.     Past Medical History  Diagnosis Date  . Ovarian cyst   . PCOS (polycystic ovarian syndrome)   . Allergy   . Anxiety   . Depression   . GERD (gastroesophageal reflux disease)     Social History   Social History  . Marital Status: Single    Spouse Name: N/A  . Number of Children: N/A  . Years of Education: N/A   Occupational History  . Not on file.   Social History Main Topics  . Smoking status: Current Every Day Smoker -- 1.00 packs/day    Types: Cigarettes  . Smokeless tobacco: Former Systems developer    Types: Chew  . Alcohol Use: No  . Drug Use: No  . Sexual Activity: Not on file   Other Topics Concern  . Not on file   Social History Narrative    Past Surgical History  Procedure Laterality Date  . Adenoidectomy    . Tympanostomy tube placement    . Adnoids    . Tympanostomy tube placement       Family History  Problem Relation Age of Onset  . Hypertension Mother   . Hyperlipidemia Mother   . Heart disease Mother   . Hyperlipidemia Father     Allergies  Allergen Reactions  . Trazodone And Nefazodone     Headache , "feels hung over"    Current Outpatient Prescriptions on File Prior to Visit  Medication Sig Dispense Refill  . cetirizine (ZYRTEC) 10 MG tablet Take 10 mg by mouth daily.    Marland Kitchen ibuprofen (ADVIL,MOTRIN) 800 MG tablet Take 1 tablet (800 mg total) by mouth every 8 (eight) hours as needed. 30 tablet 1  . meloxicam (MOBIC) 7.5 MG tablet Take 1 tablet (7.5 mg total) by mouth daily. 14 tablet 0  . ranitidine (ZANTAC) 150 MG tablet Take 1 tablet (150 mg total) by mouth 2 (two) times daily. 180 tablet 1  . valACYclovir (VALTREX) 1000 MG tablet 2 tab by mouth in AM and 2 tabs by mouth 12 hrs later at start of cold sore 20 tablet 0  . venlafaxine XR (EFFEXOR XR) 150 MG 24 hr capsule Take 1 capsule (150 mg total) by mouth daily with breakfast. 30 capsule 0   No current facility-administered medications on file prior to visit.    BP 118/80 mmHg  Pulse 92  Temp(Src) 98.6 F (37 C) (Oral)  Ht 5\' 8"  (1.727 m)  Wt 234 lb (106.142 kg)  BMI 35.59 kg/m2  SpO2 99%  LMP 11/17/2014       Objective:   Physical Exam  General Mental Status- Alert. General Appearance- Not in acute distress.   Skin General: Color- Normal Color. Moisture- Normal Moisture.  Neck Carotid Arteries- Normal color. Moisture- Normal Moisture. No carotid bruits. No JVD.  Chest and Lung Exam Auscultation: Breath Sounds:-Normal. CTA.  Cardiovascular Auscultation:Rythm- Regular, Rate and Rhythm. Murmurs & Other Heart Sounds:Auscultation of the heart reveals- No Murmurs.  Abdomen Inspection:-Inspeection Normal. Palpation/Percussion:Note:No mass. Palpation and Percussion of the abdomen reveal- Non Tender, Non Distended + BS, no rebound or guarding.   Neurologic Cranial Nerve exam:- CN  III-XII intact(No nystagmus), symmetric smile. Strength:- 5/5 equal and symmetric strength both upper and lower extremities.      Assessment & Plan:  . Doing well with anxiety so will continue the effexor.  For you insomnia I will rx low dose ambien. I think this may be good option but I want you to use as least as possible. Maybe every 3 rd or 4 th night if not getting adequate sleep.  Follow up in 3 months or as needed.  Update me on how you are doing in about 3 wks regarding sleep and frequency of ambien use

## 2014-12-20 ENCOUNTER — Telehealth (INDEPENDENT_AMBULATORY_CARE_PROVIDER_SITE_OTHER): Admitting: Medical

## 2014-12-20 DIAGNOSIS — R300 Dysuria: Secondary | ICD-10-CM

## 2014-12-20 NOTE — Telephone Encounter (Signed)
Relation to UX:NATF Call back number:[phone]  Reason for call:  Patient inquiring about x ray results and states if results are not in requesting a Rx to hold her over until results are in.

## 2014-12-21 ENCOUNTER — Other Ambulatory Visit

## 2014-12-21 ENCOUNTER — Other Ambulatory Visit (HOSPITAL_COMMUNITY)
Admission: RE | Admit: 2014-12-21 | Discharge: 2014-12-21 | Disposition: A | Discharge status: Home / Self Care | Source: Ambulatory Visit | Attending: Family | Admitting: Family

## 2014-12-21 DIAGNOSIS — Z113 Encounter for screening for infections with a predominantly sexual mode of transmission: Secondary | ICD-10-CM | POA: Diagnosis not present

## 2014-12-21 LAB — CERVICOVAGINAL ANCILLARY ONLY: Wet Prep (BD Affirm): NEGATIVE

## 2014-12-21 NOTE — Telephone Encounter (Signed)
Spoke with pt and she was notified of the results. Per Melissa VO given for pt to come in for a urine sample to test for UTI and GC/Chalamydia as well.

## 2014-12-24 ENCOUNTER — Telehealth: Payer: Self-pay | Admitting: *Deleted

## 2014-12-24 LAB — POCT URINALYSIS DIPSTICK
Bilirubin, UA: NEGATIVE
Blood, UA: NEGATIVE
Glucose, UA: NEGATIVE
Ketones, UA: NEGATIVE
Leukocytes, UA: NEGATIVE
Nitrite, UA: NEGATIVE
Protein, UA: NEGATIVE
Spec Grav, UA: 1.015
Urobilinogen, UA: NEGATIVE
pH, UA: 6

## 2014-12-24 MED ORDER — NITROFURANTOIN MONOHYD MACRO 100 MG PO CAPS: 100.0000 mg | ORAL_CAPSULE | Freq: Two times a day (BID) | ORAL | Status: DC

## 2014-12-24 NOTE — Telephone Encounter (Signed)
Notified pt and she voices understanding. She will complete antibiotic. Advised her that cytology result will take 1-2 more days and she voices understanding.

## 2014-12-24 NOTE — Telephone Encounter (Signed)
Result now in EPIC.  Please advise?

## 2014-12-24 NOTE — Telephone Encounter (Signed)
Pt called to follow up on urinalysis result. Result not final. Lab is checking on status. Per pt, ok to leave detailed message at [phone] and if Rx needed call to [email or web address]

## 2014-12-24 NOTE — Telephone Encounter (Signed)
Relation to ME:BRAX Call back number:[phone]   Reason for call:  Patient checking on the status of message below.

## 2014-12-24 NOTE — Telephone Encounter (Signed)
UA is clear.  However, if she is experiencing burning with urination, I would like her to begin empiric macrodantin just in case.  If symptoms worsen or do not improve after completion of macrodantin, I would advise pt to follow up with PCP.  Urine cytology is pending.  We will notify her when results are available.

## 2014-12-25 LAB — URINE CYTOLOGY ANCILLARY ONLY
Chlamydia: NEGATIVE
Neisseria Gonorrhea: NEGATIVE

## 2015-01-01 ENCOUNTER — Telehealth: Payer: Self-pay | Admitting: Medical

## 2015-01-01 NOTE — Telephone Encounter (Signed)
Pt would like to switch providers. She would like to be established with Dr. Charlett Blake.   Please advise.

## 2015-01-01 NOTE — Telephone Encounter (Signed)
OK to transfer if OK with [employer]

## 2015-01-02 NOTE — Telephone Encounter (Signed)
Okay 

## 2015-01-02 NOTE — Telephone Encounter (Signed)
Ok to transfer to dr Charlett Blake.

## 2015-01-16 ENCOUNTER — Encounter: Payer: Self-pay | Admitting: Medical

## 2015-01-16 ENCOUNTER — Telehealth: Payer: Self-pay | Admitting: Family Medicine

## 2015-01-16 ENCOUNTER — Ambulatory Visit (INDEPENDENT_AMBULATORY_CARE_PROVIDER_SITE_OTHER): Admitting: Medical

## 2015-01-16 VITALS — BP 130/95 | HR 76 | Temp 98.2°F | Ht 68.0 in | Wt 242.0 lb

## 2015-01-16 DIAGNOSIS — J209 Acute bronchitis, unspecified: Secondary | ICD-10-CM

## 2015-01-16 DIAGNOSIS — R062 Wheezing: Secondary | ICD-10-CM

## 2015-01-16 DIAGNOSIS — R05 Cough: Secondary | ICD-10-CM | POA: Diagnosis not present

## 2015-01-16 DIAGNOSIS — R059 Cough, unspecified: Secondary | ICD-10-CM

## 2015-01-16 MED ORDER — HYDROCODONE-HOMATROPINE 5-1.5 MG/5ML PO SYRP: 5.0000 mL | ORAL_SOLUTION | Freq: Three times a day (TID) | ORAL | Status: DC | PRN

## 2015-01-16 MED ORDER — ALBUTEROL SULFATE HFA 108 (90 BASE) MCG/ACT IN AERS: 2.0000 | INHALATION_SPRAY | Freq: Four times a day (QID) | RESPIRATORY_TRACT | Status: DC | PRN

## 2015-01-16 MED ORDER — BECLOMETHASONE DIPROPIONATE 40 MCG/ACT IN AERS: 2.0000 | INHALATION_SPRAY | Freq: Two times a day (BID) | RESPIRATORY_TRACT | Status: DC

## 2015-01-16 MED ORDER — METHYLPREDNISOLONE ACETATE 40 MG/ML IJ SUSP
40.0000 mg | Freq: Once | INTRAMUSCULAR | Status: AC
  Administered 2015-01-16: 40 mg via INTRAMUSCULAR

## 2015-01-16 MED ORDER — AMOXICILLIN-POT CLAVULANATE 875-125 MG PO TABS: 1.0000 | ORAL_TABLET | Freq: Two times a day (BID) | ORAL | Status: DC

## 2015-01-16 NOTE — Telephone Encounter (Signed)
Called pt, she says that she has already had her flu vacc at a different date while bringing in a different pt. Not showing in pt's chart that it has been updated.

## 2015-01-16 NOTE — Patient Instructions (Addendum)
For your bronchitis, I will rx augmentin.  For your cough rx of hycodan.  For wheezing will give depomedrol 40 mg im today.  Continue the remainder 2 day your prednisone tapering dose pack.  Stop smoking completely.  If you symptoms persist then may need repeat cxr and cbc. But did not do these today since reported negative over weekend at [facility]  Follow up 7 days or as needed   For your mild elevated bp today and some elevation other day. I want you to get otc bp cuff. Check bp daily. Keep a log. If your bp is 140/90 or higher then will need blood pressure medication. On follow up bring recorded readings.

## 2015-01-16 NOTE — Progress Notes (Signed)
Subjective:    Patient ID: Kathryn [name], female    DOB: 1995-03-12, 19 y.o.   MRN: [id number]  HPI  Pt in states she has chest congestion with productive cough for more than a wk. Illness going on for 2 wks. Pt went to local urgent care on Saturday or Sunday. Pt got 2 injections. But she does not know what was given. Was not [facility].  Told cxr and blood work were negative.   No sneezing or itching eyes.   Pt does have some wheezing as well. Pt is a smoker. She is only smoking 3 cigarettes a day now. Was smoking pack a day for 3 months. Prior on and off.   Review of Systems  Constitutional: Negative for fever, chills and fatigue.  Respiratory: Positive for cough and wheezing. Negative for chest tightness and shortness of breath.        Chest congestion  Cardiovascular: Negative for chest pain and palpitations.  Musculoskeletal: Negative for back pain.  Neurological: Negative for dizziness, weakness, numbness and headaches.  Hematological: Negative for adenopathy. Does not bruise/bleed easily.  Psychiatric/Behavioral: Negative for confusion.    Past Medical History  Diagnosis Date  . Ovarian cyst   . PCOS (polycystic ovarian syndrome)   . Allergy   . Anxiety   . Depression   . GERD (gastroesophageal reflux disease)     Social History   Social History  . Marital Status: Single    Spouse Name: N/A  . Number of Children: N/A  . Years of Education: N/A   Occupational History  . Not on file.   Social History Main Topics  . Smoking status: Current Every Day Smoker -- 1.00 packs/day    Types: Cigarettes  . Smokeless tobacco: Former Systems developer    Types: Chew  . Alcohol Use: No  . Drug Use: No  . Sexual Activity: Not on file   Other Topics Concern  . Not on file   Social History Narrative    Past Surgical History  Procedure Laterality Date  . Adenoidectomy    . Tympanostomy tube placement    . Adnoids    . Tympanostomy tube placement      Family History    Problem Relation Age of Onset  . Hypertension Mother   . Hyperlipidemia Mother   . Heart disease Mother   . Hyperlipidemia Father     Allergies  Allergen Reactions  . Trazodone And Nefazodone     Headache , "feels hung over"    Current Outpatient Prescriptions on File Prior to Visit  Medication Sig Dispense Refill  . cetirizine (ZYRTEC) 10 MG tablet Take 10 mg by mouth daily.    Marland Kitchen ibuprofen (ADVIL,MOTRIN) 800 MG tablet Take 1 tablet (800 mg total) by mouth every 8 (eight) hours as needed. 30 tablet 1  . ranitidine (ZANTAC) 150 MG tablet Take 1 tablet (150 mg total) by mouth 2 (two) times daily. 180 tablet 1  . valACYclovir (VALTREX) 1000 MG tablet 2 tab by mouth in AM and 2 tabs by mouth 12 hrs later at start of cold sore 20 tablet 0  . venlafaxine XR (EFFEXOR XR) 150 MG 24 hr capsule Take 1 capsule (150 mg total) by mouth daily with breakfast. 30 capsule 4  . zolpidem (AMBIEN) 5 MG tablet Take 1 tablet (5 mg total) by mouth at bedtime as needed for sleep. 15 tablet 0   No current facility-administered medications on file prior to visit.    BP 128/90  mmHg  Pulse 76  Temp(Src) 98.2 F (36.8 C) (Oral)  Ht 5\' 8"  (1.727 m)  Wt 242 lb (109.77 kg)  BMI 36.80 kg/m2  SpO2 98%  LMP 11/21/2014       Objective:   Physical Exam  General  Mental Status - Alert. General Appearance - Well groomed. Not in acute distress.  Skin Rashes- No Rashes.  HEENT Head- Normal. Ear Auditory Canal - Left- Normal. Right - Normal.Tympanic Membrane- Left- Normal. Right- Normal. Eye Sclera/Conjunctiva- Left- Normal. Right- Normal. Nose & Sinuses Nasal Mucosa- Left-   boggy or Congested. Right-  boggy or Congested. No sinus pressure Mouth & Throat Lips: Upper Lip- Normal: no dryness, cracking, pallor, cyanosis, or vesicular eruption. Lower Lip-Normal: no dryness, cracking, pallor, cyanosis or vesicular eruption. Buccal Mucosa- Bilateral- No Aphthous ulcers. Oropharynx- No Discharge or  Erythema. Tonsils: Characteristics- Bilateral- No Erythema or Congestion. Size/Enlargement- Bilateral- No enlargement. Discharge- bilateral-None.  Neck Neck- Supple. No Masses.   Chest and Lung Exam Auscultation: Breath Sounds:- even and unlabored, but bilateral upper lobe rhonchi.  Cardiovascular Auscultation:Rythm- Regular, rate and rhythm. Murmurs & Other Heart Sounds:Ausculatation of the heart reveal- No Murmurs.  Lymphatic Head & Neck General Head & Neck Lymphatics: Bilateral: Description- No Localized lymphadenopathy.       Assessment & Plan:  For your bronchitis, I will rx augmentin.  For your cough rx of hycodan.  For wheezing will give depomedrol 40 mg im today.  Continue the remainder 2 day your prednisone tapering dose pack.  Stop smoking completely.  If you symptoms persist then may need repeat cxr and cbc. But did not do these today since reported negative over weekend at [facility]  Follow up 7 days or as needed

## 2015-01-16 NOTE — Progress Notes (Signed)
Pre visit review using our clinic review tool, if applicable. No additional management support is needed unless otherwise documented below in the visit note. 

## 2015-01-16 NOTE — Telephone Encounter (Signed)
Updated 01/16/15.

## 2015-01-17 ENCOUNTER — Telehealth: Payer: Self-pay | Admitting: *Deleted

## 2015-01-17 NOTE — Telephone Encounter (Signed)
Approved through 02/14/2098. JG//CMA

## 2015-01-17 NOTE — Telephone Encounter (Signed)
PA initiated for Qvar 37mcg through [employer]. Awaiting determination. JG//CMA

## 2015-03-03 ENCOUNTER — Emergency Department (HOSPITAL_BASED_OUTPATIENT_CLINIC_OR_DEPARTMENT_OTHER)

## 2015-03-03 ENCOUNTER — Emergency Department (HOSPITAL_BASED_OUTPATIENT_CLINIC_OR_DEPARTMENT_OTHER)
Admission: EM | Admit: 2015-03-03 | Discharge: 2015-03-03 | Disposition: A | Discharge status: Home / Self Care | Attending: Emergency Medicine | Admitting: Emergency Medicine

## 2015-03-03 ENCOUNTER — Encounter (HOSPITAL_BASED_OUTPATIENT_CLINIC_OR_DEPARTMENT_OTHER): Payer: Self-pay | Admitting: Emergency Medicine

## 2015-03-03 DIAGNOSIS — Z79899 Other long term (current) drug therapy: Secondary | ICD-10-CM | POA: Diagnosis not present

## 2015-03-03 DIAGNOSIS — Z8639 Personal history of other endocrine, nutritional and metabolic disease: Secondary | ICD-10-CM | POA: Insufficient documentation

## 2015-03-03 DIAGNOSIS — Z7951 Long term (current) use of inhaled steroids: Secondary | ICD-10-CM | POA: Diagnosis not present

## 2015-03-03 DIAGNOSIS — R059 Cough, unspecified: Secondary | ICD-10-CM

## 2015-03-03 DIAGNOSIS — R079 Chest pain, unspecified: Secondary | ICD-10-CM | POA: Diagnosis not present

## 2015-03-03 DIAGNOSIS — Z8742 Personal history of other diseases of the female genital tract: Secondary | ICD-10-CM | POA: Insufficient documentation

## 2015-03-03 DIAGNOSIS — K219 Gastro-esophageal reflux disease without esophagitis: Secondary | ICD-10-CM | POA: Insufficient documentation

## 2015-03-03 DIAGNOSIS — R042 Hemoptysis: Secondary | ICD-10-CM | POA: Insufficient documentation

## 2015-03-03 DIAGNOSIS — J209 Acute bronchitis, unspecified: Secondary | ICD-10-CM | POA: Diagnosis not present

## 2015-03-03 DIAGNOSIS — F17210 Nicotine dependence, cigarettes, uncomplicated: Secondary | ICD-10-CM | POA: Insufficient documentation

## 2015-03-03 DIAGNOSIS — Z8659 Personal history of other mental and behavioral disorders: Secondary | ICD-10-CM | POA: Insufficient documentation

## 2015-03-03 DIAGNOSIS — J40 Bronchitis, not specified as acute or chronic: Secondary | ICD-10-CM

## 2015-03-03 DIAGNOSIS — R05 Cough: Secondary | ICD-10-CM | POA: Diagnosis present

## 2015-03-03 LAB — CBC WITH DIFFERENTIAL/PLATELET
Basophils Absolute: 0 10*3/uL (ref 0.0–0.1)
Basophils Relative: 0 %
Eosinophils Absolute: 0.2 10*3/uL (ref 0.0–0.7)
Eosinophils Relative: 2 %
HCT: 41.8 % (ref 36.0–46.0)
Hemoglobin: 13.9 g/dL (ref 12.0–15.0)
Lymphocytes Relative: 22 %
Lymphs Abs: 2.4 10*3/uL (ref 0.7–4.0)
MCH: 30.1 pg (ref 26.0–34.0)
MCHC: 33.3 g/dL (ref 30.0–36.0)
MCV: 90.5 fL (ref 78.0–100.0)
Monocytes Absolute: 1.3 10*3/uL — ABNORMAL HIGH (ref 0.1–1.0)
Monocytes Relative: 12 %
Neutro Abs: 6.8 10*3/uL (ref 1.7–7.7)
Neutrophils Relative %: 64 %
Platelets: 401 10*3/uL — ABNORMAL HIGH (ref 150–400)
RBC: 4.62 MIL/uL (ref 3.87–5.11)
RDW: 13.6 % (ref 11.5–15.5)
WBC: 10.8 10*3/uL — ABNORMAL HIGH (ref 4.0–10.5)

## 2015-03-03 LAB — BASIC METABOLIC PANEL
Anion gap: 11 (ref 5–15)
BUN: 12 mg/dL (ref 6–20)
CO2: 23 mmol/L (ref 22–32)
Calcium: 9.2 mg/dL (ref 8.9–10.3)
Chloride: 108 mmol/L (ref 101–111)
Creatinine, Ser: 0.88 mg/dL (ref 0.44–1.00)
GFR calc Af Amer: >60 mL/min (ref >60)
GFR calc non Af Amer: >60 mL/min (ref >60)
Glucose, Bld: 99 mg/dL (ref 65–99)
Potassium: 4 mmol/L (ref 3.5–5.1)
Sodium: 142 mmol/L (ref 135–145)

## 2015-03-03 LAB — D-DIMER, QUANTITATIVE: D-Dimer, Quant: 0.31 ug/mL-FEU (ref 0.00–0.50)

## 2015-03-03 MED ORDER — GUAIFENESIN 100 MG/5ML PO LIQD: 100.0000 mg | ORAL | Status: DC | PRN

## 2015-03-03 MED ORDER — AZITHROMYCIN 250 MG PO TABS: 250.0000 mg | ORAL_TABLET | Freq: Every day | ORAL | Status: DC

## 2015-03-03 MED ORDER — ALBUTEROL SULFATE HFA 108 (90 BASE) MCG/ACT IN AERS
2.0000 | INHALATION_SPRAY | RESPIRATORY_TRACT | Status: DC | PRN
  Administered 2015-03-03: 2 via RESPIRATORY_TRACT
  Filled 2015-03-03: qty 6.7

## 2015-03-03 NOTE — Discharge Instructions (Signed)
Acute Bronchitis Stop smoking. Follow up with your doctor. Return to the ED if you develop new or worsening symptoms. Bronchitis is inflammation of the airways that extend from the windpipe into the lungs (bronchi). The inflammation often causes mucus to develop. This leads to a cough, which is the most common symptom of bronchitis.  In acute bronchitis, the condition usually develops suddenly and goes away over time, usually in a couple weeks. Smoking, allergies, and asthma can make bronchitis worse. Repeated episodes of bronchitis may cause further lung problems.  CAUSES Acute bronchitis is most often caused by the same virus that causes a cold. The virus can spread from person to person (contagious) through coughing, sneezing, and touching contaminated objects. SIGNS AND SYMPTOMS   Cough.   Fever.   Coughing up mucus.   Body aches.   Chest congestion.   Chills.   Shortness of breath.   Sore throat.  DIAGNOSIS  Acute bronchitis is usually diagnosed through a physical exam. Your health care provider will also ask you questions about your medical history. Tests, such as chest X-rays, are sometimes done to rule out other conditions.  TREATMENT  Acute bronchitis usually goes away in a couple weeks. Oftentimes, no medical treatment is necessary. Medicines are sometimes given for relief of fever or cough. Antibiotic medicines are usually not needed but may be prescribed in certain situations. In some cases, an inhaler may be recommended to help reduce shortness of breath and control the cough. A cool mist vaporizer may also be used to help thin bronchial secretions and make it easier to clear the chest.  HOME CARE INSTRUCTIONS  Get plenty of rest.   Drink enough fluids to keep your urine clear or pale yellow (unless you have a medical condition that requires fluid restriction). Increasing fluids may help thin your respiratory secretions (sputum) and reduce chest congestion, and it  will prevent dehydration.   Take medicines only as directed by your health care provider.  If you were prescribed an antibiotic medicine, finish it all even if you start to feel better.  Avoid smoking and secondhand smoke. Exposure to cigarette smoke or irritating chemicals will make bronchitis worse. If you are a smoker, consider using nicotine gum or skin patches to help control withdrawal symptoms. Quitting smoking will help your lungs heal faster.   Reduce the chances of another bout of acute bronchitis by washing your hands frequently, avoiding people with cold symptoms, and trying not to touch your hands to your mouth, nose, or eyes.   Keep all follow-up visits as directed by your health care provider.  SEEK MEDICAL CARE IF: Your symptoms do not improve after 1 week of treatment.  SEEK IMMEDIATE MEDICAL CARE IF:  You develop an increased fever or chills.   You have chest pain.   You have severe shortness of breath.  You have bloody sputum.   You develop dehydration.  You faint or repeatedly feel like you are going to pass out.  You develop repeated vomiting.  You develop a severe headache. MAKE SURE YOU:   Understand these instructions.  Will watch your condition.  Will get help right away if you are not doing well or get worse.   This information is not intended to replace advice given to you by your health care provider. Make sure you discuss any questions you have with your health care provider.   Document Released: 03/12/2004 Document Revised: 02/23/2014 Document Reviewed: 07/26/2012 Elsevier Interactive Patient Education [employer].

## 2015-03-03 NOTE — ED Notes (Signed)
Patient states that she has had a cough and cold like symptoms for about 1 week. Reports that she now has blood when she coughs and when she sneezes also reports that she coughs so hard that "I have to squeeze my cheeks so I do not shit myself".

## 2015-03-03 NOTE — ED Provider Notes (Signed)
CSN: [id number]     Arrival date & time 03/03/15  1735 History  By signing my name below, I, Rayna Sexton, attest that this documentation has been prepared under the direction and in the presence of Ezequiel Essex, MD. Electronically Signed: Rayna Sexton, ED Scribe. 03/03/2015. 6:38 PM.     Chief Complaint  Patient presents with  . Cough   The history is provided by the patient. No language interpreter was used.    HPI Comments: Kathryn [name] is a 20 y.o. female who presents to the Emergency Department complaining of a constant, moderate, cough with onset 1 week ago. She notes associated producible bright red "chunks" and "spots" with green/yellow sputum when coughing and blowing her nose, congestion and subjective fevers (98.5 F in triage). She has been taking Dayquil, Nyquil and ibuprofen with little relief. She confirms her listed PMHx. Pt denies currently being on contraceptives. She is UTD on her flu vaccination. Pt notes a hx of smoking averaging 1 PPD and denies a PMHx including asthma. She also notes her BP was ~140/90 earlier today. She denies abd pain or any other associated symptoms at this time.    Past Medical History  Diagnosis Date  . Ovarian cyst   . PCOS (polycystic ovarian syndrome)   . Allergy   . Anxiety   . Depression   . GERD (gastroesophageal reflux disease)    Past Surgical History  Procedure Laterality Date  . Adenoidectomy    . Tympanostomy tube placement    . Adnoids    . Tympanostomy tube placement     Family History  Problem Relation Age of Onset  . Hypertension Mother   . Hyperlipidemia Mother   . Heart disease Mother   . Hyperlipidemia Father    Social History  Substance Use Topics  . Smoking status: Current Every Day Smoker -- 1.00 packs/day    Types: Cigarettes  . Smokeless tobacco: Former Systems developer    Types: Chew  . Alcohol Use: No   OB History    No data available     Review of Systems A complete 10 system review of systems was  obtained and all systems are negative except as noted in the HPI and PMH.   Allergies  Trazodone and nefazodone  Home Medications   Prior to Admission medications   Medication Sig Start Date End Date Taking? Authorizing Provider  albuterol (PROVENTIL HFA;VENTOLIN HFA) 108 (90 BASE) MCG/ACT inhaler Inhale 2 puffs into the lungs every 6 (six) hours as needed for wheezing or shortness of breath. 01/16/15   Percell Miller Saguier, PA-C  amoxicillin-clavulanate (AUGMENTIN) 875-125 MG tablet Take 1 tablet by mouth 2 (two) times daily. 01/16/15   Percell Miller Saguier, PA-C  azithromycin (ZITHROMAX Z-PAK) 250 MG tablet Take 1 tablet (250 mg total) by mouth daily. 2 tablets PO day 1, then 1 tablet PO daily 03/03/15   Ezequiel Essex, MD  beclomethasone (QVAR) 40 MCG/ACT inhaler Inhale 2 puffs into the lungs 2 (two) times daily. 01/16/15   Percell Miller Saguier, PA-C  cetirizine (ZYRTEC) 10 MG tablet Take 10 mg by mouth daily.    Historical Provider, MD  guaiFENesin (ROBITUSSIN) 100 MG/5ML liquid Take 5-10 mLs (100-200 mg total) by mouth every 4 (four) hours as needed for cough. 03/03/15   Ezequiel Essex, MD  HYDROcodone-homatropine [facility]) 5-1.5 MG/5ML syrup Take 5 mLs by mouth every 8 (eight) hours as needed for cough. 01/16/15   Percell Miller Saguier, PA-C  ibuprofen (ADVIL,MOTRIN) 800 MG tablet Take 1 tablet (800 mg total)  by mouth every 8 (eight) hours as needed. 09/16/14   Percell Miller Saguier, PA-C  ranitidine (ZANTAC) 150 MG tablet Take 1 tablet (150 mg total) by mouth 2 (two) times daily. 09/13/14   Percell Miller Saguier, PA-C  valACYclovir (VALTREX) 1000 MG tablet 2 tab by mouth in AM and 2 tabs by mouth 12 hrs later at start of cold sore 09/25/14   Debbrah Alar, NP  venlafaxine XR (EFFEXOR XR) 150 MG 24 hr capsule Take 1 capsule (150 mg total) by mouth daily with breakfast. 12/19/14   [name], PA-C  zolpidem (AMBIEN) 5 MG tablet Take 1 tablet (5 mg total) by mouth at bedtime as needed for sleep. 12/19/14   Edward Saguier, PA-C    BP 134/93 mmHg  Pulse 97  Temp(Src) 98.5 F (36.9 C) (Oral)  Resp 18  Ht 5\' 8"  (1.727 m)  Wt 242 lb (109.77 kg)  BMI 36.80 kg/m2  SpO2 99%  LMP 02/27/2015    Physical Exam  Constitutional: She is oriented to person, place, and time. She appears well-developed and well-nourished. No distress.  HENT:  Head: Normocephalic and atraumatic.  Mouth/Throat: Oropharynx is clear and moist. No oropharyngeal exudate.  Nasal congestion  Eyes: Conjunctivae and EOM are normal. Pupils are equal, round, and reactive to light.  Neck: Normal range of motion. Neck supple.  No meningismus.  Cardiovascular: Normal rate, regular rhythm, normal heart sounds and intact distal pulses.   No murmur heard. Pulmonary/Chest: Effort normal. No respiratory distress. She has wheezes.  Scattered expiratory wheezing  Abdominal: Soft. There is no tenderness. There is no rebound and no guarding.  Musculoskeletal: Normal range of motion. She exhibits no edema or tenderness.  No peripheral edema   Neurological: She is alert and oriented to person, place, and time. No cranial nerve deficit. She exhibits normal muscle tone. Coordination normal.  No ataxia on finger to nose bilaterally. No pronator drift. 5/5 strength throughout. CN 2-12 intact.Equal grip strength. Sensation intact.   Skin: Skin is warm.  Psychiatric: She has a normal mood and affect. Her behavior is normal.  Nursing note and vitals reviewed.   ED Course  Procedures  DIAGNOSTIC STUDIES: Oxygen Saturation is 100% on RA, normal by my interpretation.    COORDINATION OF CARE: 6:23 PM Discussed next steps with pt and she agreed to the plan.   Labs Review Labs Reviewed  CBC WITH DIFFERENTIAL/PLATELET - Abnormal; Notable for the following:    WBC 10.8 (*)    Platelets 401 (*)    Monocytes Absolute 1.3 (*)    All other components within normal limits  D-DIMER, QUANTITATIVE (NOT AT [facility])  BASIC METABOLIC PANEL    Imaging Review Dg Chest 2  View  03/03/2015  CLINICAL DATA:  Productive cough for 1 week. Elevated blood pressure. EXAM: CHEST  2 VIEW COMPARISON:  10/29/2013 and 10/17/2013. FINDINGS: The heart size and mediastinal contours are stable. There is stable mild chronic central airway thickening. No edema, confluent airspace opacity or pleural effusion. The bones appear unchanged. IMPRESSION: Stable mild chronic central airway thickening. No acute cardiopulmonary process. Electronically Signed   By: Richardean Sale M.D.   On: 03/03/2015 18:17   I have personally reviewed and evaluated these images and lab results as part of my medical decision-making.   EKG Interpretation None      MDM   Final diagnoses:  Bronchitis   1 week of cough, congestion, hemoptysis and chest pain with coughing. No fever. She has a smoker.  Lungs clear  with scattered respiratory wheezing. Good air exchange. Chest x-ray negative for pneumonia. D-dimer obtained given her tachycardia and hemoptysis. This is negative.  D-dimer negative.  Suspect hemoptysis is due to bronchitis. We'll treat. Smoking cessation encouraged.   I personally performed the services described in this documentation, which was scribed in my presence. The recorded information has been reviewed and is accurate.    Ezequiel Essex, MD 03/03/15 1946

## 2015-03-25 ENCOUNTER — Other Ambulatory Visit: Payer: Self-pay | Admitting: Family Medicine

## 2015-03-25 NOTE — Telephone Encounter (Signed)
Patient informed of PCP instructions and she agreed to schedule appt. And did so.

## 2015-03-25 NOTE — Telephone Encounter (Signed)
I have not seen patient yet, she needs an appt with me to establish as PCP and then I can prescribe her sleeping med. For now try OTC Melatonin 2-10 mg qhs prn insomnia

## 2015-03-25 NOTE — Telephone Encounter (Signed)
Requesting:  Zolpidem #15 with 0 refills on 12/19/2014 [employer] [facility]   None Last OV  (You are listed as her PCP, but she has never seen you, but has seen Percell Miller and Lenna Sciara)  Advise please. Last Refill   #15 with 0 refills on 12/19/2014  Please Advise

## 2015-03-25 NOTE — Telephone Encounter (Signed)
Pharmacy: [facility] [place], [place] - [address]  Reason for call: Pt requesting refill on Ambien. She is out.

## 2015-04-15 ENCOUNTER — Ambulatory Visit (INDEPENDENT_AMBULATORY_CARE_PROVIDER_SITE_OTHER): Payer: BLUE CROSS/BLUE SHIELD | Admitting: Family Medicine

## 2015-04-15 ENCOUNTER — Encounter: Payer: Self-pay | Admitting: Family Medicine

## 2015-04-15 VITALS — BP 128/82 | HR 100 | Temp 98.9°F | Ht 68.0 in | Wt 250.2 lb

## 2015-04-15 DIAGNOSIS — E669 Obesity, unspecified: Secondary | ICD-10-CM

## 2015-04-15 DIAGNOSIS — E282 Polycystic ovarian syndrome: Secondary | ICD-10-CM

## 2015-04-15 DIAGNOSIS — G4700 Insomnia, unspecified: Secondary | ICD-10-CM

## 2015-04-15 DIAGNOSIS — E782 Mixed hyperlipidemia: Secondary | ICD-10-CM | POA: Diagnosis not present

## 2015-04-15 DIAGNOSIS — F418 Other specified anxiety disorders: Secondary | ICD-10-CM

## 2015-04-15 DIAGNOSIS — J301 Allergic rhinitis due to pollen: Secondary | ICD-10-CM

## 2015-04-15 DIAGNOSIS — K219 Gastro-esophageal reflux disease without esophagitis: Secondary | ICD-10-CM

## 2015-04-15 DIAGNOSIS — Z8619 Personal history of other infectious and parasitic diseases: Secondary | ICD-10-CM

## 2015-04-15 DIAGNOSIS — D72829 Elevated white blood cell count, unspecified: Secondary | ICD-10-CM | POA: Diagnosis not present

## 2015-04-15 DIAGNOSIS — E663 Overweight: Secondary | ICD-10-CM | POA: Insufficient documentation

## 2015-04-15 HISTORY — DX: Personal history of other infectious and parasitic diseases: Z86.19

## 2015-04-15 LAB — CBC
HCT: 41 % (ref 36.0–46.0)
Hemoglobin: 14 g/dL (ref 12.0–15.0)
MCHC: 34.1 g/dL (ref 30.0–36.0)
MCV: 88.7 fl (ref 78.0–100.0)
Platelets: 367 10*3/uL (ref 150.0–400.0)
RBC: 4.62 Mil/uL (ref 3.87–5.11)
RDW: 14.1 % (ref 11.5–14.6)
WBC: 10.9 10*3/uL — ABNORMAL HIGH (ref 4.5–10.5)

## 2015-04-15 LAB — LIPID PANEL
Cholesterol: 195 mg/dL (ref 0–200)
HDL: 37.5 mg/dL — ABNORMAL LOW (ref >39.00)
NonHDL: 157.35
Total CHOL/HDL Ratio: 5
Triglycerides: 339 mg/dL — ABNORMAL HIGH (ref 0.0–149.0)
VLDL: 67.8 mg/dL — ABNORMAL HIGH (ref 0.0–40.0)

## 2015-04-15 LAB — LDL CHOLESTEROL, DIRECT: Direct LDL: 117 mg/dL

## 2015-04-15 LAB — TSH: TSH: 2.84 u[IU]/mL (ref 0.35–5.50)

## 2015-04-15 MED ORDER — ZOLPIDEM TARTRATE 5 MG PO TABS: 5.0000 mg | ORAL_TABLET | Freq: Every evening | ORAL | Status: DC | PRN

## 2015-04-15 NOTE — Assessment & Plan Note (Signed)
Uses Zyrtec prn

## 2015-04-15 NOTE — Assessment & Plan Note (Signed)
Avoid offending foods, start probiotics. Do not eat large meals in late evening and consider raising head of bed.  

## 2015-04-15 NOTE — Assessment & Plan Note (Signed)
Refill given on Ambien to use prn

## 2015-04-15 NOTE — Progress Notes (Signed)
Pre visit review using our clinic review tool, if applicable. No additional management support is needed unless otherwise documented below in the visit note. 

## 2015-04-15 NOTE — Progress Notes (Signed)
Patient ID: Michaelyn Degroote, female   DOB: [birth year], 20 y.o.   MRN: [id number]   Subjective:    Patient ID: [name], female    DOB: September 24, 1995, 20 y.o.   MRN: [id number]  Chief Complaint  Patient presents with  . Follow-up    HPI Patient is in today for follow up and to establish care with this provider. Has occasional heartburn worse after smoking. Allergies not flared. Needs ambien for sleep but does well with that. Is reestablishing with her counselor but is doing well with Venlafaxine. Struggles with depression, anxiety and PTSD s/p the death of her best friend to drug overdose 2 years ago. Denies CP/palp/SOB/HA/congestion/fevers/GI or GU c/o. Taking meds as prescribed  Past Medical History  Diagnosis Date  . Ovarian cyst   . PCOS (polycystic ovarian syndrome)   . Allergy   . Anxiety   . Depression   . GERD (gastroesophageal reflux disease)   . Obesity 04/15/2015  . 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 Depression with anxiety 04/11/2014    Past Surgical History  Procedure Laterality Date  . Adenoidectomy    . Tympanostomy tube placement    . Adnoids    . Tympanostomy tube placement      Family History  Problem Relation Age of Onset  . Hypertension Mother   . Heart disease Mother     CAD, stent at 56, s/p cardiac arrest with Defib  . Hyperlipidemia Mother   . Diabetes Mother     s/p gastric bypass  . Obesity Mother     s/p gastric bypass  . Hyperlipidemia Father   . Hypertension Father   . Asthma Sister   . Hyperlipidemia Brother   . Stroke Maternal Grandmother   . Thyroid disease Maternal Grandmother   . Heart disease Maternal Grandfather     MI first at 59, s/p stents and CABG  . Hyperlipidemia Maternal Grandfather   . Hypertension Maternal Grandfather   . Stroke Maternal Grandfather   . Cancer Maternal Grandfather     melanoma  . Aneurysm Maternal Grandfather     brain  . Kidney disease Maternal Grandfather     tumor  . Heart disease Paternal Grandmother     . Cancer Paternal Lucilla Edin and brain    Social History   Social History  . Marital Status: Single    Spouse Name: N/A  . Number of Children: N/A  . Years of Education: N/A   Occupational History  . Not on file.   Social History Main Topics  . Smoking status: Current Every Day Smoker -- 1.00 packs/day    Types: Cigarettes  . Smokeless tobacco: Former Systems developer    Types: Chew  . Alcohol Use: No  . Drug Use: No  . Sexual Activity: Yes     Comment: lives with partner, works for [employer], no diewtary restrictions.    Other Topics Concern  . Not on file   Social History Narrative    Outpatient Prescriptions Prior to Visit  Medication Sig Dispense Refill  . ibuprofen (ADVIL,MOTRIN) 800 MG tablet Take 1 tablet (800 mg total) by mouth every 8 (eight) hours as needed. 30 tablet 1  . ranitidine (ZANTAC) 150 MG tablet Take 1 tablet (150 mg total) by mouth 2 (two) times daily. 180 tablet 1  . valACYclovir (VALTREX) 1000 MG tablet 2 tab by mouth in AM and 2 tabs by mouth 12 hrs later at start of cold sore 20 tablet 0  .  venlafaxine XR (EFFEXOR XR) 150 MG 24 hr capsule Take 1 capsule (150 mg total) by mouth daily with breakfast. 30 capsule 4  . zolpidem (AMBIEN) 5 MG tablet Take 1 tablet (5 mg total) by mouth at bedtime as needed for sleep. 15 tablet 0  . albuterol (PROVENTIL HFA;VENTOLIN HFA) 108 (90 BASE) MCG/ACT inhaler Inhale 2 puffs into the lungs every 6 (six) hours as needed for wheezing or shortness of breath. 1 Inhaler 0  . amoxicillin-clavulanate (AUGMENTIN) 875-125 MG tablet Take 1 tablet by mouth 2 (two) times daily. 20 tablet 0  . azithromycin (ZITHROMAX Z-PAK) 250 MG tablet Take 1 tablet (250 mg total) by mouth daily. 2 tablets PO day 1, then 1 tablet PO daily 6 tablet 0  . beclomethasone (QVAR) 40 MCG/ACT inhaler Inhale 2 puffs into the lungs 2 (two) times daily. 1 Inhaler 3  . cetirizine (ZYRTEC) 10 MG tablet Take 10 mg by mouth daily.    Marland Kitchen guaiFENesin  (ROBITUSSIN) 100 MG/5ML liquid Take 5-10 mLs (100-200 mg total) by mouth every 4 (four) hours as needed for cough. 60 mL 0  . HYDROcodone-homatropine (HYCODAN) 5-1.5 MG/5ML syrup Take 5 mLs by mouth every 8 (eight) hours as needed for cough. 120 mL 0   No facility-administered medications prior to visit.    Allergies  Allergen Reactions  . Trazodone And Nefazodone     Headache , "feels hung over"    Review of Systems  Constitutional: Negative for fever and malaise/fatigue.  HENT: Negative for congestion.   Eyes: Negative for discharge.  Respiratory: Negative for shortness of breath.   Cardiovascular: Negative for chest pain, palpitations and leg swelling.  Gastrointestinal: Negative for nausea and abdominal pain.  Genitourinary: Negative for dysuria.  Musculoskeletal: Negative for falls.  Skin: Negative for rash.  Neurological: Negative for loss of consciousness and headaches.  Endo/Heme/Allergies: Negative for environmental allergies.  Psychiatric/Behavioral: Negative for depression. The patient is not nervous/anxious.        Objective:    Physical Exam  Constitutional: She is oriented to person, place, and time. She appears well-developed and well-nourished. No distress.  HENT:  Head: Normocephalic and atraumatic.  Nose: Nose normal.  Mouth/Throat: Oropharyngeal exudate present.  Eyes: Right eye exhibits no discharge. Left eye exhibits no discharge.  Neck: Normal range of motion. Neck supple.  Cardiovascular: Normal rate and regular rhythm.   No murmur heard. Pulmonary/Chest: Effort normal and breath sounds normal.  Abdominal: Soft. Bowel sounds are normal. There is no tenderness.  Musculoskeletal: She exhibits no edema.  Neurological: She is alert and oriented to person, place, and time.  Skin: Skin is warm and dry.  Psychiatric: She has a normal mood and affect.  Nursing note and vitals reviewed.   BP 128/82 mmHg  Pulse 100  Temp(Src) 98.9 F (37.2 C) (Oral)   Ht 5\' 8"  (1.727 m)  Wt 250 lb 4 oz (113.513 kg)  BMI 38.06 kg/m2  SpO2 97% Wt Readings from Last 3 Encounters:  04/15/15 250 lb 4 oz (113.513 kg)  03/03/15 242 lb (109.77 kg)  01/16/15 242 lb (109.77 kg)     Lab Results  Component Value Date   WBC 10.8* 03/03/2015   HGB 13.9 03/03/2015   HCT 41.8 03/03/2015   PLT 401* 03/03/2015   GLUCOSE 99 03/03/2015   ALT 76* 09/13/2014   AST 45* 09/13/2014   NA 142 03/03/2015   K 4.0 03/03/2015   CL 108 03/03/2015   CREATININE 0.88 03/03/2015   BUN 12  03/03/2015   CO2 23 03/03/2015   TSH 3.74 09/13/2014    Lab Results  Component Value Date   TSH 3.74 09/13/2014   Lab Results  Component Value Date   WBC 10.8* 03/03/2015   HGB 13.9 03/03/2015   HCT 41.8 03/03/2015   MCV 90.5 03/03/2015   PLT 401* 03/03/2015   Lab Results  Component Value Date   NA 142 03/03/2015   K 4.0 03/03/2015   CO2 23 03/03/2015   GLUCOSE 99 03/03/2015   BUN 12 03/03/2015   CREATININE 0.88 03/03/2015   BILITOT 0.3 09/13/2014   ALKPHOS 61 09/13/2014   AST 45* 09/13/2014   ALT 76* 09/13/2014   PROT 6.9 09/13/2014   ALBUMIN 4.2 09/13/2014   CALCIUM 9.2 03/03/2015   ANIONGAP 11 03/03/2015   GFR 90.93 09/13/2014    Assessment & Plan:   Problem List Items Addressed This Visit    Allergic rhinitis    Uses Zyrtec prn      Depression with anxiety    Follows with counselor at [facility], named [place]. Good response to Venlafaxine      GERD (gastroesophageal reflux disease)    Avoid offending foods, start probiotics. Do not eat large meals in late evening and consider raising head of bed.       H/O cold sores   Insomnia    Refill given on Ambien to use prn      Relevant Medications   zolpidem (AMBIEN) 5 MG tablet   Other Relevant Orders   Lipid panel   TSH   CBC   Obesity    Encouraged DASH diet, decrease po intake and increase exercise as tolerated. Needs 7-8 hours of sleep nightly. Avoid trans fats, eat small, frequent meals every  4-5 hours with lean proteins, complex carbs and healthy fats. Minimize simple carbs      PCOS (polycystic ovarian syndrome)    [facility] manages, Dr Gertie Fey has recently started OCP recently       Other Visit Diagnoses    Hyperlipidemia, mixed    -  Primary    Relevant Medications    zolpidem (AMBIEN) 5 MG tablet    Other Relevant Orders    Lipid panel    TSH    CBC    Leukocytosis        Relevant Medications    zolpidem (AMBIEN) 5 MG tablet    Other Relevant Orders    Lipid panel    TSH    CBC       I have discontinued Ms. Minteer's cetirizine, amoxicillin-clavulanate, beclomethasone, albuterol, HYDROcodone-homatropine, azithromycin, and guaiFENesin. I am also having her maintain her ranitidine, ibuprofen, valACYclovir, venlafaxine XR, norethindrone-ethinyl estradiol, and zolpidem.  Meds ordered this encounter  Medications  . norethindrone-ethinyl estradiol (JUNEL FE,GILDESS FE,LOESTRIN FE) 1-20 MG-MCG tablet    Sig: Take 1 tablet by mouth daily.  Marland Kitchen zolpidem (AMBIEN) 5 MG tablet    Sig: Take 1 tablet (5 mg total) by mouth at bedtime as needed for sleep.    Dispense:  15 tablet    Refill:  0     Penni Homans, MD

## 2015-04-15 NOTE — Assessment & Plan Note (Signed)
Encouraged DASH diet, decrease po intake and increase exercise as tolerated. Needs 7-8 hours of sleep nightly. Avoid trans fats, eat small, frequent meals every 4-5 hours with lean proteins, complex carbs and healthy fats. Minimize simple carbs 

## 2015-04-15 NOTE — Assessment & Plan Note (Signed)
[facility] manages, Dr Gertie Fey has recently started OCP recently

## 2015-04-15 NOTE — Assessment & Plan Note (Signed)
Follows with counselor at [facility], named [place]. Good response to Venlafaxine

## 2015-04-15 NOTE — Patient Instructions (Signed)
Probiotics, multiple strain version, NOW company 1 cap   Food Choices for Gastroesophageal Reflux Disease, Adult When you have gastroesophageal reflux disease (GERD), the foods you eat and your eating habits are very important. Choosing the right foods can help ease your discomfort.  WHAT GUIDELINES DO I NEED TO FOLLOW?   Choose fruits, vegetables, whole grains, and low-fat dairy products.   Choose low-fat meat, fish, and poultry.  Limit fats such as oils, salad dressings, butter, nuts, and avocado.   Keep a food diary. This helps you identify foods that cause symptoms.   Avoid foods that cause symptoms. These may be different for everyone.   Eat small meals often instead of 3 large meals a day.   Eat your meals slowly, in a place where you are relaxed.   Limit fried foods.   Cook foods using methods other than frying.   Avoid drinking alcohol.   Avoid drinking large amounts of liquids with your meals.   Avoid bending over or lying down until 2-3 hours after eating.  WHAT FOODS ARE NOT RECOMMENDED?  These are some foods and drinks that may make your symptoms worse: Vegetables Tomatoes. Tomato juice. Tomato and spaghetti sauce. Chili peppers. Onion and garlic. Horseradish. Fruits Oranges, grapefruit, and lemon (fruit and juice). Meats High-fat meats, fish, and poultry. This includes hot dogs, ribs, ham, sausage, salami, and bacon. Dairy Whole milk and chocolate milk. Sour cream. Cream. Butter. Ice cream. Cream cheese.  Drinks Coffee and tea. Bubbly (carbonated) drinks or energy drinks. Condiments Hot sauce. Barbecue sauce.  Sweets/Desserts Chocolate and cocoa. Donuts. Peppermint and spearmint. Fats and Oils High-fat foods. This includes [place] fries and potato chips. Other Vinegar. Strong spices. This includes black pepper, white pepper, red pepper, cayenne, curry powder, cloves, ginger, and chili powder. The items listed above may not be a complete list of  foods and drinks to avoid. Contact your dietitian for more information.   This information is not intended to replace advice given to you by your health care provider. Make sure you discuss any questions you have with your health care provider.   Document Released: 08/04/2011 Document Revised: 02/23/2014 Document Reviewed: 12/07/2012 Elsevier Interactive Patient Education [employer].

## 2015-05-30 ENCOUNTER — Telehealth: Payer: Self-pay | Admitting: Family Medicine

## 2015-05-30 DIAGNOSIS — D72829 Elevated white blood cell count, unspecified: Secondary | ICD-10-CM

## 2015-05-30 DIAGNOSIS — E782 Mixed hyperlipidemia: Secondary | ICD-10-CM

## 2015-05-30 DIAGNOSIS — G4700 Insomnia, unspecified: Secondary | ICD-10-CM

## 2015-05-30 MED ORDER — ZOLPIDEM TARTRATE 5 MG PO TABS: 5.0000 mg | ORAL_TABLET | Freq: Every evening | ORAL | Status: DC | PRN

## 2015-05-30 NOTE — Telephone Encounter (Signed)
Can be reached: [phone] Pharmacy: [facility], [address]  Reason for call: pt called for refill on ambien. She has 2 left. She normally takes on the weekends to help with sleep bc she doesn't work weekends.

## 2015-05-30 NOTE — Telephone Encounter (Signed)
Last Ambien Rx 04/15/15, #15. Please advise in Dr Frederik Pear absence?

## 2015-05-30 NOTE — Telephone Encounter (Signed)
Called patient to inform of medication refill

## 2015-06-12 ENCOUNTER — Telehealth: Payer: Self-pay | Admitting: Family Medicine

## 2015-06-12 NOTE — Telephone Encounter (Signed)
Caller name: Self  Can be reached: [phone]  Pharmacy:  [facility], [address] [phone] (Phone) [phone] (Fax)       Reason for call: Refill on venlafaxine XR (EFFEXOR XR) 150 MG 24 hr capsule [id number]

## 2015-06-13 MED ORDER — VENLAFAXINE HCL ER 150 MG PO CP24: 150.0000 mg | ORAL_CAPSULE | Freq: Every day | ORAL | Status: DC

## 2015-06-14 ENCOUNTER — Ambulatory Visit: Payer: BLUE CROSS/BLUE SHIELD | Admitting: Family Medicine

## 2015-06-14 DIAGNOSIS — Z0289 Encounter for other administrative examinations: Secondary | ICD-10-CM

## 2015-06-15 ENCOUNTER — Encounter (HOSPITAL_BASED_OUTPATIENT_CLINIC_OR_DEPARTMENT_OTHER): Payer: Self-pay | Admitting: *Deleted

## 2015-06-15 ENCOUNTER — Emergency Department (HOSPITAL_BASED_OUTPATIENT_CLINIC_OR_DEPARTMENT_OTHER)
Admission: EM | Admit: 2015-06-15 | Discharge: 2015-06-15 | Disposition: A | Discharge status: Home / Self Care | Payer: BLUE CROSS/BLUE SHIELD | Attending: Emergency Medicine | Admitting: Emergency Medicine

## 2015-06-15 DIAGNOSIS — Z79899 Other long term (current) drug therapy: Secondary | ICD-10-CM | POA: Diagnosis not present

## 2015-06-15 DIAGNOSIS — R51 Headache: Secondary | ICD-10-CM

## 2015-06-15 DIAGNOSIS — F329 Major depressive disorder, single episode, unspecified: Secondary | ICD-10-CM | POA: Diagnosis not present

## 2015-06-15 DIAGNOSIS — E669 Obesity, unspecified: Secondary | ICD-10-CM | POA: Diagnosis not present

## 2015-06-15 DIAGNOSIS — G43909 Migraine, unspecified, not intractable, without status migrainosus: Secondary | ICD-10-CM | POA: Insufficient documentation

## 2015-06-15 DIAGNOSIS — R519 Headache, unspecified: Secondary | ICD-10-CM

## 2015-06-15 HISTORY — DX: Migraine, unspecified, not intractable, without status migrainosus: G43.909

## 2015-06-15 MED ORDER — DIPHENHYDRAMINE HCL 50 MG/ML IJ SOLN
25.0000 mg | Freq: Once | INTRAMUSCULAR | Status: AC
  Administered 2015-06-15: 25 mg via INTRAVENOUS
  Filled 2015-06-15: qty 1

## 2015-06-15 MED ORDER — DEXAMETHASONE SODIUM PHOSPHATE 10 MG/ML IJ SOLN
10.0000 mg | Freq: Once | INTRAMUSCULAR | Status: AC
  Administered 2015-06-15: 10 mg via INTRAVENOUS
  Filled 2015-06-15: qty 1

## 2015-06-15 MED ORDER — ONDANSETRON HCL 4 MG PO TABS: 4.0000 mg | ORAL_TABLET | Freq: Four times a day (QID) | ORAL | Status: DC

## 2015-06-15 MED ORDER — PROCHLORPERAZINE EDISYLATE 5 MG/ML IJ SOLN
10.0000 mg | Freq: Once | INTRAMUSCULAR | Status: AC
  Administered 2015-06-15: 10 mg via INTRAVENOUS
  Filled 2015-06-15: qty 2

## 2015-06-15 MED ORDER — KETOROLAC TROMETHAMINE 30 MG/ML IJ SOLN
30.0000 mg | Freq: Once | INTRAMUSCULAR | Status: AC
  Administered 2015-06-15: 30 mg via INTRAVENOUS
  Filled 2015-06-15: qty 1

## 2015-06-15 MED ORDER — SODIUM CHLORIDE 0.9 % IV BOLUS (SEPSIS)
1000.0000 mL | Freq: Once | INTRAVENOUS | Status: AC
  Administered 2015-06-15: 1000 mL via INTRAVENOUS

## 2015-06-15 NOTE — ED Notes (Signed)
Thursday had ingrown toenail removed and was given oxycodone and doxycycline. Migraine started at 0200, took ibuprofen. Woke at 0300 and vomited x 1.

## 2015-06-15 NOTE — ED Provider Notes (Signed)
CSN: [id number]     Arrival date & time 06/15/15  0946 History   First MD Initiated Contact with Patient 06/15/15 0957     Chief Complaint  Patient presents with  . Migraine  . Emesis     (Consider location/radiation/quality/duration/timing/severity/associated sxs/prior Treatment) Patient is a 20 y.o. female presenting with headaches.  Headache Pain location:  Generalized Quality:  Dull Radiates to:  Does not radiate Onset quality:  Gradual Duration:  6 hours Timing:  Constant Chronicity:  New Similar to prior headaches: yes   Context: not activity   Relieved by:  None tried Worsened by:  Nothing Ineffective treatments:  None tried Associated symptoms: no fever     Past Medical History  Diagnosis Date  . Ovarian cyst   . PCOS (polycystic ovarian syndrome)   . Allergy   . Anxiety   . Depression   . GERD (gastroesophageal reflux disease)   . Obesity 04/15/2015  . 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 Depression with anxiety 04/11/2014  . Migraine    Past Surgical History  Procedure Laterality Date  . Adenoidectomy    . Tympanostomy tube placement    . Adnoids    . Tympanostomy tube placement     Family History  Problem Relation Age of Onset  . Hypertension Mother   . Heart disease Mother     CAD, stent at 51, s/p cardiac arrest with Defib  . Hyperlipidemia Mother   . Diabetes Mother     s/p gastric bypass  . Obesity Mother     s/p gastric bypass  . Hyperlipidemia Father   . Hypertension Father   . Asthma Sister   . Hyperlipidemia Brother   . Stroke Maternal Grandmother   . Thyroid disease Maternal Grandmother   . Heart disease Maternal Grandfather     MI first at 56, s/p stents and CABG  . Hyperlipidemia Maternal Grandfather   . Hypertension Maternal Grandfather   . Stroke Maternal Grandfather   . Cancer Maternal Grandfather     melanoma  . Aneurysm Maternal Grandfather     brain  . Kidney disease Maternal Grandfather     tumor  . Heart disease Paternal  Grandmother   . Cancer Paternal Lucilla Edin and brain   Social History  Substance Use Topics  . Smoking status: Current Every Day Smoker -- 1.00 packs/day    Types: Cigarettes  . Smokeless tobacco: Current User    Types: Snuff  . Alcohol Use: 0.0 oz/week    0 Standard drinks or equivalent per week     Comment: every 6 months   OB History    No data available     Review of Systems  Constitutional: Negative for fever and diaphoresis.  Skin: Positive for wound (toe).  Neurological: Positive for headaches.  All other systems reviewed and are negative.     Allergies  Trazodone and nefazodone  Home Medications   Prior to Admission medications   Medication Sig Start Date End Date Taking? Authorizing Provider  ibuprofen (ADVIL,MOTRIN) 800 MG tablet Take 1 tablet (800 mg total) by mouth every 8 (eight) hours as needed. 09/16/14  Yes Edward Saguier, PA-C  norethindrone-ethinyl estradiol (JUNEL FE,GILDESS FE,LOESTRIN FE) 1-20 MG-MCG tablet Take 1 tablet by mouth daily.   Yes Historical Provider, MD  ranitidine (ZANTAC) 150 MG tablet Take 1 tablet (150 mg total) by mouth 2 (two) times daily. 09/13/14  Yes Edward Saguier, PA-C  valACYclovir (VALTREX) 1000 MG tablet 2  tab by mouth in AM and 2 tabs by mouth 12 hrs later at start of cold sore 09/25/14  Yes Debbrah Alar, NP  venlafaxine XR (EFFEXOR XR) 150 MG 24 hr capsule Take 1 capsule (150 mg total) by mouth daily with breakfast. 06/13/15  Yes Mosie Lukes, MD  zolpidem (AMBIEN) 5 MG tablet Take 1 tablet (5 mg total) by mouth at bedtime as needed for sleep. 05/30/15  Yes Jessica C Copland, MD   BP 134/79 mmHg  Pulse 69  Temp(Src) 98.3 F (36.8 C) (Oral)  Resp 18  Ht 5\' 8"  (1.727 m)  Wt 250 lb (113.399 kg)  BMI 38.02 kg/m2  SpO2 100% Physical Exam  Constitutional: She appears well-developed and well-nourished.  HENT:  Head: Normocephalic and atraumatic.  Neck: Normal range of motion.  Cardiovascular: Normal rate and  regular rhythm.   Pulmonary/Chest: No stridor. No respiratory distress.  Abdominal: She exhibits no distension.  Musculoskeletal:  L big toe swelling  Neurological: She is alert.  No altered mental status, able to give full seemingly accurate history.  Face is symmetric, EOM's intact, pupils equal and reactive, vision intact, tongue and uvula midline without deviation Upper and Lower extremity motor 5/5, intact pain perception in distal extremities, 2+ reflexes in biceps, patella and achilles tendons. Finger to nose normal, heel to shin normal. Walks without assistance or evident ataxia.    Nursing note and vitals reviewed.   ED Course  Procedures (including critical care time) Labs Review Labs Reviewed  URINALYSIS, ROUTINE W REFLEX MICROSCOPIC (NOT AT [facility])  PREGNANCY, URINE    Imaging Review No results found. I have personally reviewed and evaluated these images and lab results as part of my medical decision-making.   EKG Interpretation None      MDM   Final diagnoses:  Acute nonintractable headache, unspecified headache type    The patient arrived with signs and symptoms consistent with a migraine headache. The patient has history of migraines. This feels like previous migraines.The patient has a reassuring neuro exam lessening chances of intracranial abnormalities. The patient was given decadron, Compazine, Benadryl with patient's pain significantly improved and is ready for discharge. The patient remained in no acute distress, hemodynamically stable with reassuring neuro exam. The patient was then discharged from the Emergency Department with no further acute issues. Recommend follow up with neurologist or PCP within the next week.  New Prescriptions: New Prescriptions   No medications on file   I have personally and contemperaneously reviewed labs and imaging and used in my decision making as above.   A medical screening exam was performed and I feel the patient has  had an appropriate workup for their chief complaint at this time and likelihood of emergent condition existing is low and thus workup can continue on an outpatient basis.. Their vital signs are stable. They have been counseled on decision, discharge, follow up and which symptoms necessitate immediate return to the emergency department.  They verbally stated understanding and agreement with plan and discharged in stable condition.      Merrily Pew, MD 06/15/15 [phone]

## 2015-07-08 ENCOUNTER — Other Ambulatory Visit: Payer: Self-pay | Admitting: Family Medicine

## 2015-07-11 ENCOUNTER — Ambulatory Visit (INDEPENDENT_AMBULATORY_CARE_PROVIDER_SITE_OTHER): Payer: BLUE CROSS/BLUE SHIELD | Admitting: Family Medicine

## 2015-07-11 ENCOUNTER — Encounter: Payer: Self-pay | Admitting: Family Medicine

## 2015-07-11 VITALS — BP 133/89 | HR 92 | Temp 98.6°F | Ht 68.0 in | Wt 245.4 lb

## 2015-07-11 DIAGNOSIS — K219 Gastro-esophageal reflux disease without esophagitis: Secondary | ICD-10-CM

## 2015-07-11 DIAGNOSIS — J3089 Other allergic rhinitis: Secondary | ICD-10-CM | POA: Diagnosis not present

## 2015-07-11 DIAGNOSIS — G4700 Insomnia, unspecified: Secondary | ICD-10-CM | POA: Diagnosis not present

## 2015-07-11 DIAGNOSIS — J029 Acute pharyngitis, unspecified: Secondary | ICD-10-CM | POA: Diagnosis not present

## 2015-07-11 MED ORDER — RANITIDINE HCL 150 MG PO TABS: 150.0000 mg | ORAL_TABLET | Freq: Two times a day (BID) | ORAL | Status: DC

## 2015-07-11 MED ORDER — ZOLPIDEM TARTRATE 5 MG PO TABS: 5.0000 mg | ORAL_TABLET | Freq: Every evening | ORAL | Status: DC | PRN

## 2015-07-11 NOTE — Progress Notes (Signed)
[facility] at [facility] [address], [place], [place] [address] [phone]  Date:  07/11/2015   Name:  Kathryn [name]   DOB:  1995-11-22   MRN:  [id number]  PCP:  Penni Homans, MD    Chief Complaint: Sore Throat   History of Present Illness:  Kathryn [name] is a 20 y.o. very pleasant female patient who presents with the following:  She has noted a ST for about one week-  She also has noted that her ears hurt. Not sure if she might have strep or allergies She has not noted a fever She has noted some cough on occasion She has noted some sneezing but not a runny nose.   No GI symptoms  No systemic sx such as chills, body aches  She takes zantac BID for her GERD- needs RF  She also uses ambien 5 mg prn; she may take it on the weekends only- needs RF  Patient Active Problem List   Diagnosis Date Noted  . Obesity 04/15/2015  . 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 Pain in joint, ankle and foot 12/19/2014  . Insomnia 07/09/2014  . Allergic rhinitis 04/11/2014  . GERD (gastroesophageal reflux disease) 04/11/2014  . Depression with anxiety 04/11/2014  . PCOS (polycystic ovarian syndrome) 04/11/2014    Past Medical History  Diagnosis Date  . Ovarian cyst   . PCOS (polycystic ovarian syndrome)   . Allergy   . Anxiety   . Depression   . GERD (gastroesophageal reflux disease)   . Obesity 04/15/2015  . 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 Depression with anxiety 04/11/2014  . Migraine     Past Surgical History  Procedure Laterality Date  . Adenoidectomy    . Tympanostomy tube placement    . Adnoids    . Tympanostomy tube placement      Social History  Substance Use Topics  . Smoking status: Current Every Day Smoker -- 1.00 packs/day    Types: Cigarettes  . Smokeless tobacco: Current User    Types: Snuff  . Alcohol Use: 0.0 oz/week    0 Standard drinks or equivalent per week     Comment: every 6 months    Family History   Problem Relation Age of Onset  . Hypertension Mother   . Heart disease Mother     CAD, stent at 59, s/p cardiac arrest with Defib  . Hyperlipidemia Mother   . Diabetes Mother     s/p gastric bypass  . Obesity Mother     s/p gastric bypass  . Hyperlipidemia Father   . Hypertension Father   . Asthma Sister   . Hyperlipidemia Brother   . Stroke Maternal Grandmother   . Thyroid disease Maternal Grandmother   . Heart disease Maternal Grandfather     MI first at 66, s/p stents and CABG  . Hyperlipidemia Maternal Grandfather   . Hypertension Maternal Grandfather   . Stroke Maternal Grandfather   . Cancer Maternal Grandfather     melanoma  . Aneurysm Maternal Grandfather     brain  . Kidney disease Maternal Grandfather     tumor  . Heart disease Paternal Grandmother   . Cancer Paternal Grandfather     ung and brain    Allergies  Allergen Reactions  . Trazodone And Nefazodone     Headache , "feels hung over"    Medication list has been reviewed and updated.  Current Outpatient Prescriptions on File  Prior to Visit  Medication Sig Dispense Refill  . ibuprofen (ADVIL,MOTRIN) 800 MG tablet Take 1 tablet (800 mg total) by mouth every 8 (eight) hours as needed. 30 tablet 1  . ranitidine (ZANTAC) 150 MG tablet Take 1 tablet (150 mg total) by mouth 2 (two) times daily. 180 tablet 1  . valACYclovir (VALTREX) 1000 MG tablet 2 tab by mouth in AM and 2 tabs by mouth 12 hrs later at start of cold sore 20 tablet 0  . venlafaxine XR (EFFEXOR-XR) 150 MG 24 hr capsule TAKE ONE CAPSULE BY MOUTH DAILY WITH BREAKFAST 30 capsule 2  . zolpidem (AMBIEN) 5 MG tablet Take 1 tablet (5 mg total) by mouth at bedtime as needed for sleep. 15 tablet 0  . norethindrone-ethinyl estradiol (JUNEL FE,GILDESS FE,LOESTRIN FE) 1-20 MG-MCG tablet Take 1 tablet by mouth daily. Reported on 07/11/2015    . ondansetron (ZOFRAN) 4 MG tablet Take 1 tablet (4 mg total) by mouth every 6 (six) hours. (Patient not taking:  Reported on 07/11/2015) 12 tablet 0   No current facility-administered medications on file prior to visit.    Review of Systems:  As per HPI- otherwise negative.   Physical Examination: Filed Vitals:   07/11/15 1613  BP: 133/89  Pulse: 92  Temp: 98.6 F (37 C)    Ideal Body Weight:    GEN: WDWN, NAD, Non-toxic, A & O x 3, looks well, large build HEENT: Atraumatic, Normocephalic. Neck supple. No masses, No LAD.  Bilateral TM wnl, oropharynx slightly injected, nasal cavity with stringy mucus typical of AR.  PEERL,EOMI.   Ears and Nose: No external deformity. CV: RRR, No M/G/R. No JVD. No thrill. No extra heart sounds. PULM: CTA B, no wheezes, crackles, rhonchi. No retractions. No resp. distress. No accessory muscle use. EXTR: No c/c/e NEURO Normal gait.  PSYCH: Normally interactive. Conversant. Not depressed or anxious appearing.  Calm demeanor.   Rapid strep negative today Assessment and Plan: Gastroesophageal reflux disease without esophagitis  Insomnia - Plan: zolpidem (AMBIEN) 5 MG tablet  Other allergic rhinitis  Sore throat   Here today with likely SR Suggested OTC loratadine and flonase- she will let me know if not helpful for her Refilled her ambien and zantac today She will let me know if not feeling better soon! Signed Lamar Blinks, MD

## 2015-07-11 NOTE — Patient Instructions (Signed)
We refilled your zantac and Lorrin Mais today- continue to use ambien sparingly and never drive after taking it or combine with alcohol.   For your allergies, I would recommend some OTC claritin (loradatine) and/or flonase nasal spray Let me know if this is not helpful for you! Your strep test is negative

## 2015-07-11 NOTE — Progress Notes (Signed)
Pre visit review using our clinic review tool, if applicable. No additional management support is needed unless otherwise documented below in the visit note. 

## 2015-08-22 ENCOUNTER — Encounter: Payer: Self-pay | Admitting: Family Medicine

## 2015-08-22 ENCOUNTER — Ambulatory Visit (INDEPENDENT_AMBULATORY_CARE_PROVIDER_SITE_OTHER): Payer: BLUE CROSS/BLUE SHIELD | Admitting: Family Medicine

## 2015-08-22 VITALS — BP 129/87 | HR 98 | Temp 98.5°F | Ht 68.5 in | Wt 245.4 lb

## 2015-08-22 DIAGNOSIS — F418 Other specified anxiety disorders: Secondary | ICD-10-CM

## 2015-08-22 DIAGNOSIS — R030 Elevated blood-pressure reading, without diagnosis of hypertension: Secondary | ICD-10-CM | POA: Diagnosis not present

## 2015-08-22 DIAGNOSIS — Z8249 Family history of ischemic heart disease and other diseases of the circulatory system: Secondary | ICD-10-CM | POA: Diagnosis not present

## 2015-08-22 DIAGNOSIS — J029 Acute pharyngitis, unspecified: Secondary | ICD-10-CM

## 2015-08-22 DIAGNOSIS — IMO0001 Reserved for inherently not codable concepts without codable children: Secondary | ICD-10-CM

## 2015-08-22 DIAGNOSIS — Z720 Tobacco use: Secondary | ICD-10-CM

## 2015-08-22 LAB — POCT RAPID STREP A (OFFICE): Rapid Strep A Screen: NEGATIVE

## 2015-08-22 MED ORDER — VARENICLINE TARTRATE 1 MG PO TABS: 1.0000 mg | ORAL_TABLET | Freq: Two times a day (BID) | ORAL | Status: DC

## 2015-08-22 MED ORDER — MONTELUKAST SODIUM 10 MG PO TABS: 10.0000 mg | ORAL_TABLET | Freq: Every day | ORAL | Status: DC

## 2015-08-22 MED ORDER — VENLAFAXINE HCL 75 MG PO TABS: 75.0000 mg | ORAL_TABLET | Freq: Every day | ORAL | Status: DC

## 2015-08-22 MED ORDER — VARENICLINE TARTRATE 0.5 MG X 11 & 1 MG X 42 PO MISC: ORAL | Status: DC

## 2015-08-22 MED ORDER — VENLAFAXINE HCL ER 150 MG PO CP24: 150.0000 mg | ORAL_CAPSULE | Freq: Every day | ORAL | Status: DC

## 2015-08-22 NOTE — Patient Instructions (Signed)
We are going to increase your effexor to 225- take a 150 and a 75 mg daily.  If this does not help decrease back to 150 Keep an eye on your BP- I would like you to be less than 130/90 on average.  I hope that we can get your BP better and then restart your birth control pills   Add singulair for your sore throat- take it once a day as needed for allergy symptoms.

## 2015-08-22 NOTE — Progress Notes (Signed)
Pre visit review using our clinic review tool, if applicable. No additional management support is needed unless otherwise documented below in the visit note. 

## 2015-08-22 NOTE — Progress Notes (Signed)
[place] at [facility] [address] [place], [place] [address] [id number] [phone]  Date:  08/22/2015   Name:  Kathryn [name]   DOB:  [birth year]   MRN:  [id number]  PCP:  Penni Homans, MD    Chief Complaint: Blood Pressure Check; Sore Throat; and Medication discussion   History of Present Illness:  Kathryn [name] is a 20 y.o. very pleasant female patient who presents with the following:  Here today with a few concerns.   BP: She will check her BP at home and has noted that it can be high, readings 133-160 / 83-106.   She does smoke and is overweight, there is also a family history of HTN and heart disease  Her mother, aunt and sister all have HTN.  There is a history of early cardiac disease which is somewhat vague.  Her mother had her first stent at age 44.  Her paternal uncle died at age 28 with an MI.    She does a physically strenuous job and does not have any CP with exertion herself   Another MD (her OBG) stopped her OCP 2-3 months ago due to concern about her BP.  However she notes that the OCP was helping with her PCOS sx, she would like to restart these when she can.  She is SA with a female partner so contraception is not a concern for her  She is smoking about 1 PPD at this time time and and is also dipping chewing tobacco She is on effexor 150 mgXR- she has been on this for about one year she feels like it is not working as well as it had in the past.  She feels that her mood and anxiety are not as well controlled  She does take [place] for sleep some days of the week- does not use it every day  She has some interest in chantix- has not tried this in the past but would be willing to try it to stop smoking  She takes zyrtec daily for allergies Has noted a solitary ST- no fever, cough, earache or other sx- for about one week    BP Readings from Last 3 Encounters:  08/22/15 129/87  07/11/15 133/89  06/15/15 134/79   Patient  Active Problem List   Diagnosis Date Noted  . Obesity 04/15/2015  . 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 Pain in joint, ankle and foot 12/19/2014  . Insomnia 07/09/2014  . Allergic rhinitis 04/11/2014  . GERD (gastroesophageal reflux disease) 04/11/2014  . Depression with anxiety 04/11/2014  . PCOS (polycystic ovarian syndrome) 04/11/2014    Past Medical History  Diagnosis Date  . Ovarian cyst   . PCOS (polycystic ovarian syndrome)   . Allergy   . Anxiety   . Depression   . GERD (gastroesophageal reflux disease)   . Obesity 04/15/2015  . 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 Depression with anxiety 04/11/2014  . Migraine     Past Surgical History  Procedure Laterality Date  . Adenoidectomy    . Tympanostomy tube placement    . Adnoids    . Tympanostomy tube placement      Social History  Substance Use Topics  . Smoking status: Current Every Day Smoker -- 1.00 packs/day    Types: Cigarettes  . Smokeless tobacco: Current User    Types: Snuff  . Alcohol Use: 0.0 oz/week    0 Standard drinks or equivalent per week  Comment: every 6 months    Family History  Problem Relation Age of Onset  . Hypertension Mother   . Heart disease Mother     CAD, stent at 28, s/p cardiac arrest with Defib  . Hyperlipidemia Mother   . Diabetes Mother     s/p gastric bypass  . Obesity Mother     s/p gastric bypass  . Hyperlipidemia Father   . Hypertension Father   . Asthma Sister   . Hyperlipidemia Brother   . Stroke Maternal Grandmother   . Thyroid disease Maternal Grandmother   . Heart disease Maternal Grandfather     MI first at 30, s/p stents and CABG  . Hyperlipidemia Maternal Grandfather   . Hypertension Maternal Grandfather   . Stroke Maternal Grandfather   . Cancer Maternal Grandfather     melanoma  . Aneurysm Maternal Grandfather     brain  . Kidney disease Maternal Grandfather     tumor  . Heart disease Paternal Grandmother   . Cancer Paternal Grandfather     ung and brain     Allergies  Allergen Reactions  . Trazodone And Nefazodone     Headache , "feels hung over"    Medication list has been reviewed and updated.  Current Outpatient Prescriptions on File Prior to Visit  Medication Sig Dispense Refill  . ibuprofen (ADVIL,MOTRIN) 800 MG tablet Take 1 tablet (800 mg total) by mouth every 8 (eight) hours as needed. 30 tablet 1  . metFORMIN (GLUCOPHAGE-XR) 750 MG 24 hr tablet Take 1 tablet by mouth daily.    . ondansetron (ZOFRAN) 4 MG tablet Take 1 tablet (4 mg total) by mouth every 6 (six) hours. (Patient taking differently: Take 4 mg by mouth every 6 (six) hours as needed. ) 12 tablet 0  . ranitidine (ZANTAC) 150 MG tablet Take 1 tablet (150 mg total) by mouth 2 (two) times daily. 180 tablet 1  . valACYclovir (VALTREX) 1000 MG tablet 2 tab by mouth in AM and 2 tabs by mouth 12 hrs later at start of cold sore (Patient taking differently: 2 tab by mouth in AM and 2 tabs by mouth 12 hrs later at start of cold sore as needed) 20 tablet 0  . venlafaxine XR (EFFEXOR-XR) 150 MG 24 hr capsule TAKE ONE CAPSULE BY MOUTH DAILY WITH BREAKFAST 30 capsule 2  . zolpidem (AMBIEN) 5 MG tablet Take 1 tablet (5 mg total) by mouth at bedtime as needed for sleep. 30 tablet 0   No current facility-administered medications on file prior to visit.    Review of Systems:  As per HPI- otherwise negative.   Physical Examination: Filed Vitals:   08/22/15 1652  BP: 129/87  Pulse: 98  Temp: 98.5 F (36.9 C)   Filed Vitals:   08/22/15 1652  Height: 5' 8.5" (1.74 m)  Weight: 245 lb 6.4 oz (111.313 kg)   Body mass index is 36.77 kg/(m^2). Ideal Body Weight: Weight in (lb) to have BMI = 25: 166.5  GEN: WDWN, NAD, Non-toxic, A & O x 3, obese, OW looks well HEENT: Atraumatic, Normocephalic. Neck supple. No masses, No LAD.  Bilateral TM wnl, oropharynx red, no exudate.  PEERL,EOMI.   Ears and Nose: No external deformity. CV: RRR, No M/G/R. No JVD. No thrill. No extra heart  sounds. PULM: CTA B, no wheezes, crackles, rhonchi. No retractions. No resp. distress. No accessory muscle use. ABD: S, NT, ND EXTR: No c/c/e NEURO Normal gait.  PSYCH: Normally interactive. Conversant.  Not depressed or anxious appearing.  Calm demeanor.   Results for orders placed or performed in visit on 08/22/15  POCT rapid strep A  Result Value Ref Range   Rapid Strep A Screen Negative Negative    Assessment and Plan: Tobacco abuse - Plan: varenicline (CHANTIX STARTING MONTH PAK) 0.5 MG X 11 & 1 MG X 42 tablet, varenicline (CHANTIX CONTINUING MONTH PAK) 1 MG tablet  Acute pharyngitis, unspecified etiology - Plan: venlafaxine (EFFEXOR) 75 MG tablet, POCT rapid strep A, venlafaxine XR (EFFEXOR-XR) 150 MG 24 hr capsule, montelukast (SINGULAIR) 10 MG tablet  Depression with anxiety  Family history of premature CAD  Elevated BP  Sore throat   Reassured that her BP readings at clinic have been reasonable, if a bit high.  For now would focus more on reducing her CV risk factors and less on exact BP numbers.  Quitting smoking is paramount.  Discussed chantix and how to use it, she would like to try this medication Also recommended that she consider seeing cardiology when she is 29 or so- a stress test may be reasonable at that time We hope that quitting smoking will reduce her BP and enable her to get back on OCP She does not feel that her effexor is giving her optimum results.  Will increase to 225 mg= 150 + 75 daily.   She takes her effexor in the am so increased sedation due to Lorrin Mais is unlikely  Strep negative- will add singulair for likely allergic pharyngitis   She will contact me with some BP readings in 1-2 weeks.    Meds ordered this encounter  Medications  . varenicline (CHANTIX STARTING MONTH PAK) 0.5 MG X 11 & 1 MG X 42 tablet    Sig: Take one 0.5 mg tablet by mouth once daily for 3 days, then increase to one 0.5 mg tablet twice daily for 4 days, then increase to one 1  mg tablet twice daily.    Dispense:  53 tablet    Refill:  0  . varenicline (CHANTIX CONTINUING MONTH PAK) 1 MG tablet    Sig: Take 1 tablet (1 mg total) by mouth 2 (two) times daily.    Dispense:  60 tablet    Refill:  3  . venlafaxine (EFFEXOR) 75 MG tablet    Sig: Take 1 tablet (75 mg total) by mouth daily. Take with 150 to equal total of 225    Dispense:  30 tablet    Refill:  6  . venlafaxine XR (EFFEXOR-XR) 150 MG 24 hr capsule    Sig: Take 1 capsule (150 mg total) by mouth daily with breakfast. Take with 75 to equal 225 mg    Dispense:  30 capsule    Refill:  2  . montelukast (SINGULAIR) 10 MG tablet    Sig: Take 1 tablet (10 mg total) by mouth at bedtime.    Dispense:  30 tablet    Refill:  3     Signed Lamar Blinks, MD

## 2015-10-09 ENCOUNTER — Telehealth: Payer: Self-pay | Admitting: Family Medicine

## 2015-10-09 ENCOUNTER — Ambulatory Visit (INDEPENDENT_AMBULATORY_CARE_PROVIDER_SITE_OTHER): Payer: BLUE CROSS/BLUE SHIELD | Admitting: Physician Assistant

## 2015-10-09 ENCOUNTER — Encounter: Payer: Self-pay | Admitting: Physician Assistant

## 2015-10-09 VITALS — BP 118/90 | HR 103 | Temp 98.8°F | Resp 16 | Ht 68.5 in | Wt 243.2 lb

## 2015-10-09 DIAGNOSIS — L03311 Cellulitis of abdominal wall: Secondary | ICD-10-CM

## 2015-10-09 MED ORDER — DOXYCYCLINE HYCLATE 100 MG PO CAPS: 100.0000 mg | ORAL_CAPSULE | Freq: Two times a day (BID) | ORAL | 0 refills | Status: DC

## 2015-10-09 NOTE — Telephone Encounter (Signed)
Pt called in to schedule due to some belly button pain. Pt says that it is red, has puss and she thinks that it is infected because it smells really bad.   Scheduled pt . ALSO transferred pt to team health to be advised.

## 2015-10-09 NOTE — Patient Instructions (Signed)
Please take the antibiotic as directed. Apply warm compresses to the area. Avoid scratching the area. No shaving until this is resolved.  Follow-up as scheduled with Dr. Charlett Blake on Tuesday. Return immediately or go to the ER if anything worsens while on antibiotic or if fever develops.

## 2015-10-09 NOTE — Progress Notes (Signed)
Patient presents to clinic today c/o 1.5 weeks of redness and tenderness of umbilicus with 1 days of pustular drainage. Denies fever, chills, malaise/fatigue. Denies trauma or injury. Has not taken anything for symptoms.   Past Medical History:  Diagnosis Date  . Allergy   . Anxiety   . Depression   . Depression with anxiety 04/11/2014  . GERD (gastroesophageal reflux disease)   . 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 Migraine   . Obesity 04/15/2015  . Ovarian cyst   . PCOS (polycystic ovarian syndrome)     Current Outpatient Prescriptions on File Prior to Visit  Medication Sig Dispense Refill  . ibuprofen (ADVIL,MOTRIN) 800 MG tablet Take 1 tablet (800 mg total) by mouth every 8 (eight) hours as needed. 30 tablet 1  . metFORMIN (GLUCOPHAGE-XR) 750 MG 24 hr tablet Take 1 tablet by mouth daily. Increased to 1000 mg    . ondansetron (ZOFRAN) 4 MG tablet Take 1 tablet (4 mg total) by mouth every 6 (six) hours. (Patient taking differently: Take 4 mg by mouth every 6 (six) hours as needed. ) 12 tablet 0  . ranitidine (ZANTAC) 150 MG tablet Take 1 tablet (150 mg total) by mouth 2 (two) times daily. 180 tablet 1  . valACYclovir (VALTREX) 1000 MG tablet 2 tab by mouth in AM and 2 tabs by mouth 12 hrs later at start of cold sore (Patient taking differently: 2 tab by mouth in AM and 2 tabs by mouth 12 hrs later at start of cold sore as needed) 20 tablet 0  . venlafaxine (EFFEXOR) 75 MG tablet Take 1 tablet (75 mg total) by mouth daily. Take with 150 to equal total of 225 30 tablet 6  . venlafaxine XR (EFFEXOR-XR) 150 MG 24 hr capsule Take 1 capsule (150 mg total) by mouth daily with breakfast. Take with 75 to equal 225 mg 30 capsule 2  . zolpidem (AMBIEN) 5 MG tablet Take 1 tablet (5 mg total) by mouth at bedtime as needed for sleep. 30 tablet 0  . montelukast (SINGULAIR) 10 MG tablet Take 1 tablet (10 mg total) by mouth at bedtime. 30 tablet 3   No current facility-administered medications on file prior  to visit.     Allergies  Allergen Reactions  . Oxycodone Nausea Only    Oxycontin also  . Trazodone And Nefazodone     Headache , "feels hung over"    Family History  Problem Relation Age of Onset  . Hypertension Mother   . Heart disease Mother     CAD, stent at 37, s/p cardiac arrest with Defib  . Hyperlipidemia Mother   . Diabetes Mother     s/p gastric bypass  . Obesity Mother     s/p gastric bypass  . Hyperlipidemia Father   . Hypertension Father   . Asthma Sister   . Hyperlipidemia Brother   . Stroke Maternal Grandmother   . Thyroid disease Maternal Grandmother   . Heart disease Maternal Grandfather     MI first at 38, s/p stents and CABG  . Hyperlipidemia Maternal Grandfather   . Hypertension Maternal Grandfather   . Stroke Maternal Grandfather   . Cancer Maternal Grandfather     melanoma  . Aneurysm Maternal Grandfather     brain  . Kidney disease Maternal Grandfather     tumor  . Heart disease Paternal Grandmother   . Cancer Paternal Lucilla Edin and brain    Social History  Social History  . Marital status: Single    Spouse name: N/A  . Number of children: N/A  . Years of education: N/A   Social History Main Topics  . Smoking status: Current Some Day Smoker    Packs/day: 1.00    Types: Cigarettes  . Smokeless tobacco: Current User    Types: Snuff  . Alcohol use 0.0 oz/week     Comment: every 6 months  . Drug use: No  . Sexual activity: Yes    Birth control/ protection: Pill     Comment: lives with partner, works for [employer], no diewtary restrictions.    Other Topics Concern  . None   Social History Narrative  . None    Review of Systems - See HPI.  All other ROS are negative.  BP 118/90 (BP Location: Right Arm, Patient Position: Sitting, Cuff Size: Large)   Pulse (!) 103   Temp 98.8 F (37.1 C) (Oral)   Resp 16   Ht 5' 8.5" (1.74 m)   Wt 243 lb 4 oz (110.3 kg)   LMP 10/07/2015   SpO2 98%   BMI 36.45 kg/m    Physical Exam  Constitutional: She is oriented to person, place, and time and well-developed, well-nourished, and in no distress.  HENT:  Head: Normocephalic.  Eyes: Conjunctivae are normal.  Cardiovascular: Normal rate, regular rhythm, normal heart sounds and intact distal pulses.   Pulmonary/Chest: Effort normal and breath sounds normal. No respiratory distress. She has no wheezes. She has no rales. She exhibits no tenderness.  Neurological: She is alert and oriented to person, place, and time.  Skin:     Vitals reviewed.   Recent Results (from the past 2160 hour(s))  POCT rapid strep A     Status: Normal   Collection Time: 08/22/15  6:33 PM  Result Value Ref Range   Rapid Strep A Screen Negative Negative    Assessment/Plan: 1. Cellulitis, abdominal wall Mild. Afebrile. Will begin course of Doxycycline. Culture sent. Supportive measures reviewed. She has appt with PCP in 2 days. Will FU at that time. Alarm signs/symptoms discussed that would prompt ER assessment. Patient voices understanding and agreement with plan.  - doxycycline (VIBRAMYCIN) 100 MG capsule; Take 1 capsule (100 mg total) by mouth 2 (two) times daily.  Dispense: 14 capsule; Refill: 0 - Wound culture   Leeanne Rio, PA-C

## 2015-10-09 NOTE — Telephone Encounter (Signed)
[facility]  Patient Name: Kathryn [name]  DOB: Mar 31, 1995    Initial Comment thinks her belly button is infected. its red, was some puss yellow/green/bloodly puss coming out, foul odor   Nurse Assessment  Nurse: Harlow Mares, RN, Suanne Marker Date/Time Eilene Ghazi Time): 10/09/2015 4:01:05 PM  Confirm and document reason for call. If symptomatic, describe symptoms. You must click the next button to save text entered. ---thinks her belly button is infected. its red, was some puss yellow/green/bloodly puss coming out, foul odor. The area is painful as well. Denies fever. Denies previous surgery or belly button ring. Reports that when it began she began to clean with peroxide and it just got worse. Reports pain x1 week, today is the first day of drainage.  Has the patient traveled out of the country within the last 30 days? ---Not Applicable  Does the patient have any new or worsening symptoms? ---Yes  Will a triage be completed? ---Yes  Related visit to physician within the last 2 weeks? ---No  Does the PT have any chronic conditions? (i.e. diabetes, asthma, etc.) ---Yes  List chronic conditions. ---depression, anxiety  Is the patient pregnant or possibly pregnant? (Ask all females between the ages of 92-55) ---No  Is this a behavioral health or substance abuse call? ---No     Guidelines    Guideline Title Affirmed Question Affirmed Notes       Final Disposition User        Comments  Caller reports that she has an appt with Elyn Aquas this evening at 6:30p already scheduled.

## 2015-10-11 ENCOUNTER — Other Ambulatory Visit: Payer: Self-pay | Admitting: Physician Assistant

## 2015-10-11 DIAGNOSIS — R1909 Other intra-abdominal and pelvic swelling, mass and lump: Secondary | ICD-10-CM

## 2015-10-13 LAB — WOUND CULTURE: Gram Stain: NONE SEEN

## 2015-10-14 ENCOUNTER — Other Ambulatory Visit: Payer: Self-pay | Admitting: Physician Assistant

## 2015-10-14 ENCOUNTER — Encounter: Payer: Self-pay | Admitting: Family Medicine

## 2015-10-14 MED ORDER — CEFDINIR 300 MG PO CAPS: 300.0000 mg | ORAL_CAPSULE | Freq: Two times a day (BID) | ORAL | 0 refills | Status: DC

## 2015-10-14 NOTE — Telephone Encounter (Signed)
Discussed results with patient.  Will stop Doxycycline and start Cefdinir.  Medication sent to the pharmacy.  Has follow-up scheduled with PCP, Dr. Charlett Blake, this week.

## 2015-10-14 NOTE — Telephone Encounter (Signed)
Pt returning providers call for results.   CB: [phone]

## 2015-10-15 ENCOUNTER — Encounter: Payer: Self-pay | Admitting: Family Medicine

## 2015-10-15 ENCOUNTER — Ambulatory Visit (INDEPENDENT_AMBULATORY_CARE_PROVIDER_SITE_OTHER): Payer: BLUE CROSS/BLUE SHIELD | Admitting: Family Medicine

## 2015-10-15 VITALS — BP 122/82 | HR 80 | Temp 98.4°F | Ht 69.0 in | Wt 239.1 lb

## 2015-10-15 DIAGNOSIS — E669 Obesity, unspecified: Secondary | ICD-10-CM

## 2015-10-15 DIAGNOSIS — K219 Gastro-esophageal reflux disease without esophagitis: Secondary | ICD-10-CM | POA: Diagnosis not present

## 2015-10-15 DIAGNOSIS — N946 Dysmenorrhea, unspecified: Secondary | ICD-10-CM

## 2015-10-15 DIAGNOSIS — E282 Polycystic ovarian syndrome: Secondary | ICD-10-CM

## 2015-10-15 DIAGNOSIS — L02216 Cutaneous abscess of umbilicus: Secondary | ICD-10-CM

## 2015-10-15 DIAGNOSIS — Z0000 Encounter for general adult medical examination without abnormal findings: Secondary | ICD-10-CM | POA: Diagnosis not present

## 2015-10-15 DIAGNOSIS — F3160 Bipolar disorder, current episode mixed, unspecified: Secondary | ICD-10-CM

## 2015-10-15 MED ORDER — LEVONORGEST-ETH ESTRAD 91-DAY 0.15-0.03 &0.01 MG PO TABS: 1.0000 | ORAL_TABLET | Freq: Every day | ORAL | 4 refills | Status: DC

## 2015-10-15 NOTE — Progress Notes (Signed)
Pre visit review using our clinic review tool, if applicable. No additional management support is needed unless otherwise documented below in the visit note. 

## 2015-10-15 NOTE — Patient Instructions (Addendum)
DASH Eating Plan DASH stands for "Dietary Approaches to Stop Hypertension." The DASH eating plan is a healthy eating plan that has been shown to reduce high blood pressure (hypertension). Additional health benefits may include reducing the risk of type 2 diabetes mellitus, heart disease, and stroke. The DASH eating plan may also help with weight loss. WHAT DO I NEED TO KNOW ABOUT THE DASH EATING PLAN? For the DASH eating plan, you will follow these general guidelines:  Choose foods with a percent daily value for sodium of less than 5% (as listed on the food label).  Use salt-free seasonings or herbs instead of table salt or sea salt.  Check with your health care provider or pharmacist before using salt substitutes.  Eat lower-sodium products, often labeled as "lower sodium" or "no salt added."  Eat fresh foods.  Eat more vegetables, fruits, and low-fat dairy products.  Choose whole grains. Look for the word "whole" as the first word in the ingredient list.  Choose fish and skinless chicken or [place] more often than red meat. Limit fish, poultry, and meat to 6 oz (170 g) each day.  Limit sweets, desserts, sugars, and sugary drinks.  Choose heart-healthy fats.  Limit cheese to 1 oz (28 g) per day.  Eat more home-cooked food and less restaurant, buffet, and fast food.  Limit fried foods.  Cook foods using methods other than frying.  Limit canned vegetables. If you do use them, rinse them well to decrease the sodium.  When eating at a restaurant, ask that your food be prepared with less salt, or no salt if possible. WHAT FOODS CAN I EAT? Seek help from a dietitian for individual calorie needs. Grains Whole grain or whole wheat bread. Brown rice. Whole grain or whole wheat pasta. Quinoa, bulgur, and whole grain cereals. Low-sodium cereals. Corn or whole wheat flour tortillas. Whole grain cornbread. Whole grain crackers. Low-sodium crackers. Vegetables Fresh or frozen vegetables  (raw, steamed, roasted, or grilled). Low-sodium or reduced-sodium tomato and vegetable juices. Low-sodium or reduced-sodium tomato sauce and paste. Low-sodium or reduced-sodium canned vegetables.  Fruits All fresh, canned (in natural juice), or frozen fruits. Meat and Other Protein Products Ground beef (85% or leaner), grass-fed beef, or beef trimmed of fat. Skinless chicken or [place]. Ground chicken or [place]. Pork trimmed of fat. All fish and seafood. Eggs. Dried beans, peas, or lentils. Unsalted nuts and seeds. Unsalted canned beans. Dairy Low-fat dairy products, such as skim or 1% milk, 2% or reduced-fat cheeses, low-fat ricotta or cottage cheese, or plain low-fat yogurt. Low-sodium or reduced-sodium cheeses. Fats and Oils Tub margarines without trans fats. Light or reduced-fat mayonnaise and salad dressings (reduced sodium). Avocado. Safflower, olive, or canola oils. Natural peanut or almond butter. Other Unsalted popcorn and pretzels. The items listed above may not be a complete list of recommended foods or beverages. Contact your dietitian for more options. WHAT FOODS ARE NOT RECOMMENDED? Grains White bread. White pasta. White rice. Refined cornbread. Bagels and croissants. Crackers that contain trans fat. Vegetables Creamed or fried vegetables. Vegetables in a cheese sauce. Regular canned vegetables. Regular canned tomato sauce and paste. Regular tomato and vegetable juices. Fruits Dried fruits. Canned fruit in light or heavy syrup. Fruit juice. Meat and Other Protein Products Fatty cuts of meat. Ribs, chicken wings, bacon, sausage, bologna, salami, chitterlings, fatback, hot dogs, bratwurst, and packaged luncheon meats. Salted nuts and seeds. Canned beans with salt. Dairy Whole or 2% milk, cream, half-and-half, and cream cheese. Whole-fat or sweetened yogurt. Full-fat  cheeses or blue cheese. Nondairy creamers and whipped toppings. Processed cheese, cheese spreads, or cheese  curds. Condiments Onion and garlic salt, seasoned salt, table salt, and sea salt. Canned and packaged gravies. Worcestershire sauce. Tartar sauce. Barbecue sauce. Teriyaki sauce. Soy sauce, including reduced sodium. Steak sauce. Fish sauce. Oyster sauce. Cocktail sauce. Horseradish. Ketchup and mustard. Meat flavorings and tenderizers. Bouillon cubes. Hot sauce. Tabasco sauce. Marinades. Taco seasonings. Relishes. Fats and Oils Butter, stick margarine, lard, shortening, ghee, and bacon fat. Coconut, palm kernel, or palm oils. Regular salad dressings. Other Pickles and olives. Salted popcorn and pretzels. The items listed above may not be a complete list of foods and beverages to avoid. Contact your dietitian for more information. WHERE CAN I FIND MORE INFORMATION? National Heart, Lung, and [employer] Institute: travelstabloid.com   This information is not intended to replace advice given to you by your health care provider. Make sure you discuss any questions you have with your health care provider.   Document Released: 01/22/2011 Document Revised: 02/23/2014 Document Reviewed: 12/07/2012 Elsevier Interactive Patient Education 2016 [place] for Adults, Female A healthy lifestyle and preventive care can promote health and wellness. Preventive health guidelines for women include the following key practices.  A routine yearly physical is a good way to check with your health care provider about your health and preventive screening. It is a chance to share any concerns and updates on your health and to receive a thorough exam.  Visit your dentist for a routine exam and preventive care every 6 months. Brush your teeth twice a day and floss once a day. Good oral hygiene prevents tooth decay and gum disease.  The frequency of eye exams is based on your age, health, family medical history, use of contact lenses, and other factors. Follow your health care  provider's recommendations for frequency of eye exams.  Eat a healthy diet. Foods like vegetables, fruits, whole grains, low-fat dairy products, and lean protein foods contain the nutrients you need without too many calories. Decrease your intake of foods high in solid fats, added sugars, and salt. Eat the right amount of calories for you.Get information about a proper diet from your health care provider, if necessary.  Regular physical exercise is one of the most important things you can do for your health. Most adults should get at least 150 minutes of moderate-intensity exercise (any activity that increases your heart rate and causes you to sweat) each week. In addition, most adults need muscle-strengthening exercises on 2 or more days a week.  Maintain a healthy weight. The body mass index (BMI) is a screening tool to identify possible weight problems. It provides an estimate of body fat based on height and weight. Your health care provider can find your BMI and can help you achieve or maintain a healthy weight.For adults 20 years and older:  A BMI below 18.5 is considered underweight.  A BMI of 18.5 to 24.9 is normal.  A BMI of 25 to 29.9 is considered overweight.  A BMI of 30 and above is considered obese.  Maintain normal blood lipids and cholesterol levels by exercising and minimizing your intake of saturated fat. Eat a balanced diet with plenty of fruit and vegetables. Blood tests for lipids and cholesterol should begin at age 73 and be repeated every 5 years. If your lipid or cholesterol levels are high, you are over 50, or you are at high risk for heart disease, you may need your cholesterol levels checked more  frequently.Ongoing high lipid and cholesterol levels should be treated with medicines if diet and exercise are not working.  If you smoke, find out from your health care provider how to quit. If you do not use tobacco, do not start.  Lung cancer screening is recommended for  adults aged 60-80 years who are at high risk for developing lung cancer because of a history of smoking. A yearly low-dose CT scan of the lungs is recommended for people who have at least a 30-pack-year history of smoking and are a current smoker or have quit within the past 15 years. A pack year of smoking is smoking an average of 1 pack of cigarettes a day for 1 year (for example: 1 pack a day for 30 years or 2 packs a day for 15 years). Yearly screening should continue until the smoker has stopped smoking for at least 15 years. Yearly screening should be stopped for people who develop a health problem that would prevent them from having lung cancer treatment.  If you are pregnant, do not drink alcohol. If you are breastfeeding, be very cautious about drinking alcohol. If you are not pregnant and choose to drink alcohol, do not have more than 1 drink per day. One drink is considered to be 12 ounces (355 mL) of beer, 5 ounces (148 mL) of wine, or 1.5 ounces (44 mL) of liquor.  Avoid use of street drugs. Do not share needles with anyone. Ask for help if you need support or instructions about stopping the use of drugs.  High blood pressure causes heart disease and increases the risk of stroke. Your blood pressure should be checked at least every 1 to 2 years. Ongoing high blood pressure should be treated with medicines if weight loss and exercise do not work.  If you are 22-57 years old, ask your health care provider if you should take aspirin to prevent strokes.  Diabetes screening is done by taking a blood sample to check your blood glucose level after you have not eaten for a certain period of time (fasting). If you are not overweight and you do not have risk factors for diabetes, you should be screened once every 3 years starting at age 56. If you are overweight or obese and you are 82-74 years of age, you should be screened for diabetes every year as part of your cardiovascular risk  assessment.  Breast cancer screening is essential preventive care for women. You should practice "breast self-awareness." This means understanding the normal appearance and feel of your breasts and may include breast self-examination. Any changes detected, no matter how small, should be reported to a health care provider. Women in their 54s and 30s should have a clinical breast exam (CBE) by a health care provider as part of a regular health exam every 1 to 3 years. After age 8, women should have a CBE every year. Starting at age 32, women should consider having a mammogram (breast X-ray test) every year. Women who have a family history of breast cancer should talk to their health care provider about genetic screening. Women at a high risk of breast cancer should talk to their health care providers about having an MRI and a mammogram every year.  Breast cancer gene (BRCA)-related cancer risk assessment is recommended for women who have family members with BRCA-related cancers. BRCA-related cancers include breast, ovarian, tubal, and peritoneal cancers. Having family members with these cancers may be associated with an increased risk for harmful changes (mutations) in  the breast cancer genes BRCA1 and BRCA2. Results of the assessment will determine the need for genetic counseling and BRCA1 and BRCA2 testing.  Your health care provider may recommend that you be screened regularly for cancer of the pelvic organs (ovaries, uterus, and vagina). This screening involves a pelvic examination, including checking for microscopic changes to the surface of your cervix (Pap test). You may be encouraged to have this screening done every 3 years, beginning at age 92.  For women ages 38-65, health care providers may recommend pelvic exams and Pap testing every 3 years, or they may recommend the Pap and pelvic exam, combined with testing for human papilloma virus (HPV), every 5 years. Some types of HPV increase your risk of  cervical cancer. Testing for HPV may also be done on women of any age with unclear Pap test results.  Other health care providers may not recommend any screening for nonpregnant women who are considered low risk for pelvic cancer and who do not have symptoms. Ask your health care provider if a screening pelvic exam is right for you.  If you have had past treatment for cervical cancer or a condition that could lead to cancer, you need Pap tests and screening for cancer for at least 20 years after your treatment. If Pap tests have been discontinued, your risk factors (such as having a new sexual partner) need to be reassessed to determine if screening should resume. Some women have medical problems that increase the chance of getting cervical cancer. In these cases, your health care provider may recommend more frequent screening and Pap tests.  Colorectal cancer can be detected and often prevented. Most routine colorectal cancer screening begins at the age of 31 years and continues through age 31 years. However, your health care provider may recommend screening at an earlier age if you have risk factors for colon cancer. On a yearly basis, your health care provider may provide home test kits to check for hidden blood in the stool. Use of a small camera at the end of a tube, to directly examine the colon (sigmoidoscopy or colonoscopy), can detect the earliest forms of colorectal cancer. Talk to your health care provider about this at age 40, when routine screening begins. Direct exam of the colon should be repeated every 5-10 years through age 20 years, unless early forms of precancerous polyps or small growths are found.  People who are at an increased risk for hepatitis B should be screened for this virus. You are considered at high risk for hepatitis B if:  You were born in a country where hepatitis B occurs often. Talk with your health care provider about which countries are considered high risk.  Your  parents were born in a high-risk country and you have not received a shot to protect against hepatitis B (hepatitis B vaccine).  You have HIV or AIDS.  You use needles to inject street drugs.  You live with, or have sex with, someone who has hepatitis B.  You get hemodialysis treatment.  You take certain medicines for conditions like cancer, organ transplantation, and autoimmune conditions.  Hepatitis C blood testing is recommended for all people born from [birth year] through 1966 and any individual with known risks for hepatitis C.  Practice safe sex. Use condoms and avoid high-risk sexual practices to reduce the spread of sexually transmitted infections (STIs). STIs include gonorrhea, chlamydia, syphilis, trichomonas, herpes, HPV, and human immunodeficiency virus (HIV). Herpes, HIV, and HPV are viral illnesses that have  no cure. They can result in disability, cancer, and death.  You should be screened for sexually transmitted illnesses (STIs) including gonorrhea and chlamydia if:  You are sexually active and are younger than 24 years.  You are older than 24 years and your health care provider tells you that you are at risk for this type of infection.  Your sexual activity has changed since you were last screened and you are at an increased risk for chlamydia or gonorrhea. Ask your health care provider if you are at risk.  If you are at risk of being infected with HIV, it is recommended that you take a prescription medicine daily to prevent HIV infection. This is called preexposure prophylaxis (PrEP). You are considered at risk if:  You are sexually active and do not regularly use condoms or know the HIV status of your partner(s).  You take drugs by injection.  You are sexually active with a partner who has HIV.  Talk with your health care provider about whether you are at high risk of being infected with HIV. If you choose to begin PrEP, you should first be tested for HIV. You should then  be tested every 3 months for as long as you are taking PrEP.  Osteoporosis is a disease in which the bones lose minerals and strength with aging. This can result in serious bone fractures or breaks. The risk of osteoporosis can be identified using a bone density scan. Women ages 16 years and over and women at risk for fractures or osteoporosis should discuss screening with their health care providers. Ask your health care provider whether you should take a calcium supplement or vitamin D to reduce the rate of osteoporosis.  Menopause can be associated with physical symptoms and risks. Hormone replacement therapy is available to decrease symptoms and risks. You should talk to your health care provider about whether hormone replacement therapy is right for you.  Use sunscreen. Apply sunscreen liberally and repeatedly throughout the day. You should seek shade when your shadow is shorter than you. Protect yourself by wearing long sleeves, pants, a wide-brimmed hat, and sunglasses year round, whenever you are outdoors.  Once a month, do a whole body skin exam, using a mirror to look at the skin on your back. Tell your health care provider of new moles, moles that have irregular borders, moles that are larger than a pencil eraser, or moles that have changed in shape or color.  Stay current with required vaccines (immunizations).  Influenza vaccine. All adults should be immunized every year.  Tetanus, diphtheria, and acellular pertussis (Td, Tdap) vaccine. Pregnant women should receive 1 dose of Tdap vaccine during each pregnancy. The dose should be obtained regardless of the length of time since the last dose. Immunization is preferred during the 27th-36th week of gestation. An adult who has not previously received Tdap or who does not know her vaccine status should receive 1 dose of Tdap. This initial dose should be followed by tetanus and diphtheria toxoids (Td) booster doses every 10 years. Adults with an  unknown or incomplete history of completing a 3-dose immunization series with Td-containing vaccines should begin or complete a primary immunization series including a Tdap dose. Adults should receive a Td booster every 10 years.  Varicella vaccine. An adult without evidence of immunity to varicella should receive 2 doses or a second dose if she has previously received 1 dose. Pregnant females who do not have evidence of immunity should receive the first dose  after pregnancy. This first dose should be obtained before leaving the health care facility. The second dose should be obtained 4-8 weeks after the first dose.  Human papillomavirus (HPV) vaccine. Females aged 13-26 years who have not received the vaccine previously should obtain the 3-dose series. The vaccine is not recommended for use in pregnant females. However, pregnancy testing is not needed before receiving a dose. If a female is found to be pregnant after receiving a dose, no treatment is needed. In that case, the remaining doses should be delayed until after the pregnancy. Immunization is recommended for any person with an immunocompromised condition through the age of 1 years if she did not get any or all doses earlier. During the 3-dose series, the second dose should be obtained 4-8 weeks after the first dose. The third dose should be obtained 24 weeks after the first dose and 16 weeks after the second dose.  Zoster vaccine. One dose is recommended for adults aged 57 years or older unless certain conditions are present.  Measles, mumps, and rubella (MMR) vaccine. Adults born before [birth year] generally are considered immune to measles and mumps. Adults born in [birth year] or later should have 1 or more doses of MMR vaccine unless there is a contraindication to the vaccine or there is laboratory evidence of immunity to each of the three diseases. A routine second dose of MMR vaccine should be obtained at least 28 days after the first dose for students  attending postsecondary schools, health care workers, or international travelers. People who received inactivated measles vaccine or an unknown type of measles vaccine during 1963-1967 should receive 2 doses of MMR vaccine. People who received inactivated mumps vaccine or an unknown type of mumps vaccine before 1979 and are at high risk for mumps infection should consider immunization with 2 doses of MMR vaccine. For females of childbearing age, rubella immunity should be determined. If there is no evidence of immunity, females who are not pregnant should be vaccinated. If there is no evidence of immunity, females who are pregnant should delay immunization until after pregnancy. Unvaccinated health care workers born before [birth year] who lack laboratory evidence of measles, mumps, or rubella immunity or laboratory confirmation of disease should consider measles and mumps immunization with 2 doses of MMR vaccine or rubella immunization with 1 dose of MMR vaccine.  Pneumococcal 13-valent conjugate (PCV13) vaccine. When indicated, a person who is uncertain of his immunization history and has no record of immunization should receive the PCV13 vaccine. All adults 27 years of age and older should receive this vaccine. An adult aged 23 years or older who has certain medical conditions and has not been previously immunized should receive 1 dose of PCV13 vaccine. This PCV13 should be followed with a dose of pneumococcal polysaccharide (PPSV23) vaccine. Adults who are at high risk for pneumococcal disease should obtain the PPSV23 vaccine at least 8 weeks after the dose of PCV13 vaccine. Adults older than 21 years of age who have normal immune system function should obtain the PPSV23 vaccine dose at least 1 year after the dose of PCV13 vaccine.  Pneumococcal polysaccharide (PPSV23) vaccine. When PCV13 is also indicated, PCV13 should be obtained first. All adults aged 90 years and older should be immunized. An adult younger than  age 28 years who has certain medical conditions should be immunized. Any person who resides in a nursing home or long-term care facility should be immunized. An adult smoker should be immunized. People with an immunocompromised condition and certain  other conditions should receive both PCV13 and PPSV23 vaccines. People with human immunodeficiency virus (HIV) infection should be immunized as soon as possible after diagnosis. Immunization during chemotherapy or radiation therapy should be avoided. Routine use of PPSV23 vaccine is not recommended for American Indians, [place]atives, or people younger than 65 years unless there are medical conditions that require PPSV23 vaccine. When indicated, people who have unknown immunization and have no record of immunization should receive PPSV23 vaccine. One-time revaccination 5 years after the first dose of PPSV23 is recommended for people aged 19-64 years who have chronic kidney failure, nephrotic syndrome, asplenia, or immunocompromised conditions. People who received 1-2 doses of PPSV23 before age 84 years should receive another dose of PPSV23 vaccine at age 69 years or later if at least 5 years have passed since the previous dose. Doses of PPSV23 are not needed for people immunized with PPSV23 at or after age 74 years.  Meningococcal vaccine. Adults with asplenia or persistent complement component deficiencies should receive 2 doses of quadrivalent meningococcal conjugate (MenACWY-D) vaccine. The doses should be obtained at least 2 months apart. Microbiologists working with certain meningococcal bacteria, [employer] [place] recruits, people at risk during an outbreak, and people who travel to or live in countries with a high rate of meningitis should be immunized. A first-year college student up through age 51 years who is living in a residence hall should receive a dose if she did not receive a dose on or after her 16th birthday. Adults who have certain high-risk conditions  should receive one or more doses of vaccine.  Hepatitis A vaccine. Adults who wish to be protected from this disease, have certain high-risk conditions, work with hepatitis A-infected animals, work in hepatitis A research labs, or travel to or work in countries with a high rate of hepatitis A should be immunized. Adults who were previously unvaccinated and who anticipate close contact with an international adoptee during the first 60 days after arrival in the [place] from a country with a high rate of hepatitis A should be immunized.  Hepatitis B vaccine. Adults who wish to be protected from this disease, have certain high-risk conditions, may be exposed to blood or other infectious body fluids, are household contacts or sex partners of hepatitis B positive people, are clients or workers in certain care facilities, or travel to or work in countries with a high rate of hepatitis B should be immunized.  Haemophilus influenzae type b (Hib) vaccine. A previously unvaccinated person with asplenia or sickle cell disease or having a scheduled splenectomy should receive 1 dose of Hib vaccine. Regardless of previous immunization, a recipient of a hematopoietic stem cell transplant should receive a 3-dose series 6-12 months after her successful transplant. Hib vaccine is not recommended for adults with HIV infection. Preventive Services / Frequency Ages 92 to 25 years  Blood pressure check.** / Every 3-5 years.  Lipid and cholesterol check.** / Every 5 years beginning at age 56.  Clinical breast exam.** / Every 3 years for women in their 58s and 39s.  BRCA-related cancer risk assessment.** / For women who have family members with a BRCA-related cancer (breast, ovarian, tubal, or peritoneal cancers).  Pap test.** / Every 2 years from ages 76 through 83. Every 3 years starting at age 44 through age 41 or 72 with a history of 3 consecutive normal Pap tests.  HPV screening.** / Every 3 years from ages 32  through ages 74 to 24 with a history of 3 consecutive  normal Pap tests.  Hepatitis C blood test.** / For any individual with known risks for hepatitis C.  Skin self-exam. / Monthly.  Influenza vaccine. / Every year.  Tetanus, diphtheria, and acellular pertussis (Tdap, Td) vaccine.** / Consult your health care provider. Pregnant women should receive 1 dose of Tdap vaccine during each pregnancy. 1 dose of Td every 10 years.  Varicella vaccine.** / Consult your health care provider. Pregnant females who do not have evidence of immunity should receive the first dose after pregnancy.  HPV vaccine. / 3 doses over 6 months, if 67 and younger. The vaccine is not recommended for use in pregnant females. However, pregnancy testing is not needed before receiving a dose.  Measles, mumps, rubella (MMR) vaccine.** / You need at least 1 dose of MMR if you were born in 1957 or later. You may also need a 2nd dose. For females of childbearing age, rubella immunity should be determined. If there is no evidence of immunity, females who are not pregnant should be vaccinated. If there is no evidence of immunity, females who are pregnant should delay immunization until after pregnancy.  Pneumococcal 13-valent conjugate (PCV13) vaccine.** / Consult your health care provider.  Pneumococcal polysaccharide (PPSV23) vaccine.** / 1 to 2 doses if you smoke cigarettes or if you have certain conditions.  Meningococcal vaccine.** / 1 dose if you are age 61 to 31 years and a Market researcher living in a residence hall, or have one of several medical conditions, you need to get vaccinated against meningococcal disease. You may also need additional booster doses.  Hepatitis A vaccine.** / Consult your health care provider.  Hepatitis B vaccine.** / Consult your health care provider.  Haemophilus influenzae type b (Hib) vaccine.** / Consult your health care provider. Ages 67 to 72 years  Blood pressure check.** /  Every year.  Lipid and cholesterol check.** / Every 5 years beginning at age 58 years.  Lung cancer screening. / Every year if you are aged 66-80 years and have a 30-pack-year history of smoking and currently smoke or have quit within the past 15 years. Yearly screening is stopped once you have quit smoking for at least 15 years or develop a health problem that would prevent you from having lung cancer treatment.  Clinical breast exam.** / Every year after age 83 years.  BRCA-related cancer risk assessment.** / For women who have family members with a BRCA-related cancer (breast, ovarian, tubal, or peritoneal cancers).  Mammogram.** / Every year beginning at age 51 years and continuing for as long as you are in good health. Consult with your health care provider.  Pap test.** / Every 3 years starting at age 74 years through age 83 or 57 years with a history of 3 consecutive normal Pap tests.  HPV screening.** / Every 3 years from ages 1 years through ages 83 to 14 years with a history of 3 consecutive normal Pap tests.  Fecal occult blood test (FOBT) of stool. / Every year beginning at age 57 years and continuing until age 25 years. You may not need to do this test if you get a colonoscopy every 10 years.  Flexible sigmoidoscopy or colonoscopy.** / Every 5 years for a flexible sigmoidoscopy or every 10 years for a colonoscopy beginning at age 45 years and continuing until age 67 years.  Hepatitis C blood test.** / For all people born from [birth year] through 1965 and any individual with known risks for hepatitis C.  Skin self-exam. / Monthly.  Influenza vaccine. / Every year.  Tetanus, diphtheria, and acellular pertussis (Tdap/Td) vaccine.** / Consult your health care provider. Pregnant women should receive 1 dose of Tdap vaccine during each pregnancy. 1 dose of Td every 10 years.  Varicella vaccine.** / Consult your health care provider. Pregnant females who do not have evidence of immunity  should receive the first dose after pregnancy.  Zoster vaccine.** / 1 dose for adults aged 14 years or older.  Measles, mumps, rubella (MMR) vaccine.** / You need at least 1 dose of MMR if you were born in 1957 or later. You may also need a second dose. For females of childbearing age, rubella immunity should be determined. If there is no evidence of immunity, females who are not pregnant should be vaccinated. If there is no evidence of immunity, females who are pregnant should delay immunization until after pregnancy.  Pneumococcal 13-valent conjugate (PCV13) vaccine.** / Consult your health care provider.  Pneumococcal polysaccharide (PPSV23) vaccine.** / 1 to 2 doses if you smoke cigarettes or if you have certain conditions.  Meningococcal vaccine.** / Consult your health care provider.  Hepatitis A vaccine.** / Consult your health care provider.  Hepatitis B vaccine.** / Consult your health care provider.  Haemophilus influenzae type b (Hib) vaccine.** / Consult your health care provider. Ages 31 years and over  Blood pressure check.** / Every year.  Lipid and cholesterol check.** / Every 5 years beginning at age 6 years.  Lung cancer screening. / Every year if you are aged 69-80 years and have a 30-pack-year history of smoking and currently smoke or have quit within the past 15 years. Yearly screening is stopped once you have quit smoking for at least 15 years or develop a health problem that would prevent you from having lung cancer treatment.  Clinical breast exam.** / Every year after age 1 years.  BRCA-related cancer risk assessment.** / For women who have family members with a BRCA-related cancer (breast, ovarian, tubal, or peritoneal cancers).  Mammogram.** / Every year beginning at age 41 years and continuing for as long as you are in good health. Consult with your health care provider.  Pap test.** / Every 3 years starting at age 21 years through age 37 or 36 years with  3 consecutive normal Pap tests. Testing can be stopped between 65 and 70 years with 3 consecutive normal Pap tests and no abnormal Pap or HPV tests in the past 10 years.  HPV screening.** / Every 3 years from ages 58 years through ages 60 or 24 years with a history of 3 consecutive normal Pap tests. Testing can be stopped between 65 and 70 years with 3 consecutive normal Pap tests and no abnormal Pap or HPV tests in the past 10 years.  Fecal occult blood test (FOBT) of stool. / Every year beginning at age 55 years and continuing until age 50 years. You may not need to do this test if you get a colonoscopy every 10 years.  Flexible sigmoidoscopy or colonoscopy.** / Every 5 years for a flexible sigmoidoscopy or every 10 years for a colonoscopy beginning at age 45 years and continuing until age 55 years.  Hepatitis C blood test.** / For all people born from [birth year] through 1965 and any individual with known risks for hepatitis C.  Osteoporosis screening.** / A one-time screening for women ages 65 years and over and women at risk for fractures or osteoporosis.  Skin self-exam. / Monthly.  Influenza vaccine. / Every year.  Tetanus,  diphtheria, and acellular pertussis (Tdap/Td) vaccine.** / 1 dose of Td every 10 years.  Varicella vaccine.** / Consult your health care provider.  Zoster vaccine.** / 1 dose for adults aged 64 years or older.  Pneumococcal 13-valent conjugate (PCV13) vaccine.** / Consult your health care provider.  Pneumococcal polysaccharide (PPSV23) vaccine.** / 1 dose for all adults aged 42 years and older.  Meningococcal vaccine.** / Consult your health care provider.  Hepatitis A vaccine.** / Consult your health care provider.  Hepatitis B vaccine.** / Consult your health care provider.  Haemophilus influenzae type b (Hib) vaccine.** / Consult your health care provider. ** Family history and personal history of risk and conditions may change your health care provider's  recommendations.   This information is not intended to replace advice given to you by your health care provider. Make sure you discuss any questions you have with your health care provider.   Document Released: 03/31/2001 Document Revised: 02/23/2014 Document Reviewed: 06/30/2010 Elsevier Interactive Patient Education [employer].

## 2015-10-17 ENCOUNTER — Telehealth: Payer: Self-pay | Admitting: Family Medicine

## 2015-10-17 MED ORDER — QUETIAPINE FUMARATE 100 MG PO TABS: ORAL_TABLET | ORAL | 0 refills | Status: DC

## 2015-10-17 NOTE — Telephone Encounter (Signed)
Patient informed MDQ test done by PCP was positive for Bipolar. PCP instructed to inform the patient and prescribe seroquel if agrees/schedule to see PCP in 2 to 4 weeks. Patient instructed of medication instructions/to schedule appt. In 2 to 4 weeks.  Patient will call back to do so.

## 2015-10-27 ENCOUNTER — Encounter: Payer: Self-pay | Admitting: Family Medicine

## 2015-10-27 DIAGNOSIS — N946 Dysmenorrhea, unspecified: Secondary | ICD-10-CM | POA: Insufficient documentation

## 2015-10-27 DIAGNOSIS — L02216 Cutaneous abscess of umbilicus: Secondary | ICD-10-CM | POA: Insufficient documentation

## 2015-10-27 DIAGNOSIS — Z0000 Encounter for general adult medical examination without abnormal findings: Secondary | ICD-10-CM

## 2015-10-27 HISTORY — DX: Encounter for general adult medical examination without abnormal findings: Z00.00

## 2015-10-27 NOTE — Assessment & Plan Note (Signed)
Encouraged DASH diet, decrease po intake and increase exercise as tolerated. Needs 7-8 hours of sleep nightly. Avoid trans fats, eat small, frequent meals every 4-5 hours with lean proteins, complex carbs and healthy fats. Minimize simple carbs 

## 2015-10-27 NOTE — Assessment & Plan Note (Signed)
Continue Metformin 

## 2015-10-27 NOTE — Assessment & Plan Note (Signed)
Previously diagnosed with depression and anxiety. Screen today is positive for bipolar disorder. Started on Seroquel in addition to her Venlafaxine and see her in rapid follow up. Call if any concerns.

## 2015-10-27 NOTE — Assessment & Plan Note (Signed)
Avoid offending foods, start probiotics. Do not eat large meals in late evening and consider raising head of bed.  

## 2015-10-27 NOTE — Assessment & Plan Note (Signed)
Improving with recent antibiotic, keep appointment with general surgery. Cleans daily with mild soap and Witch Hazel astringent.

## 2015-10-27 NOTE — Assessment & Plan Note (Signed)
OCPs have been somewhat helpful.

## 2015-10-27 NOTE — Progress Notes (Signed)
Patient ID: Reachel Guthridge, female   DOB: 12/09/1995, 20 y.o.   MRN: [id number]   Subjective:    Patient ID: [name], female    DOB: January 13, 1996, 20 y.o.   MRN: [id number]  Chief Complaint  Patient presents with  . Annual Exam    HPI Patient is in today for annual preventative exam. Her infection in her umbilicus is improving. She notes decreased discharge, redness, swelling and pain with recent antibiotic. No fevers, chills. Dysmenorrhea somewhat better with OCP. Is noting persistent irritation and irritability with Venlafaxine. Endorses trouble concentrating, sleeping, and keeping her cool. Trouble with concentration. Denies CP/palp/SOB/HA/congestion/fevers/GI or GU c/o. Taking meds as prescribed  Past Medical History:  Diagnosis Date  . Allergy   . Anxiety   . Bipolar disorder ([place]) 04-22-2014   PTSD s/p death of best friend   . Depression   . Depression with anxiety 04-22-14  . GERD (gastroesophageal reflux disease)   . 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 Migraine   . Obesity 04/15/2015  . Ovarian cyst   . PCOS (polycystic ovarian syndrome)   . Preventative health care 10/27/2015    Past Surgical History:  Procedure Laterality Date  . ADENOIDECTOMY    . Adnoids    . TYMPANOSTOMY TUBE PLACEMENT    . TYMPANOSTOMY TUBE PLACEMENT      Family History  Problem Relation Age of Onset  . Hypertension Mother   . Heart disease Mother     CAD, stent at 40, s/p cardiac arrest with Defib  . Hyperlipidemia Mother   . Diabetes Mother     s/p gastric bypass  . Obesity Mother     s/p gastric bypass  . Hyperlipidemia Father   . Hypertension Father   . Asthma Sister   . Hyperlipidemia Brother   . Stroke Maternal Grandmother   . Thyroid disease Maternal Grandmother   . Heart disease Maternal Grandfather     MI first at 51, s/p stents and CABG  . Hyperlipidemia Maternal Grandfather   . Hypertension Maternal Grandfather   . Stroke Maternal Grandfather   . Cancer Maternal Grandfather       melanoma  . Aneurysm Maternal Grandfather     brain  . Kidney disease Maternal Grandfather     tumor  . Heart disease Paternal Grandmother   . Cancer Paternal Lucilla Edin and brain  . Heart disease Maternal Uncle     Social History   Social History  . Marital status: Single    Spouse name: N/A  . Number of children: N/A  . Years of education: N/A   Occupational History  . Not on file.   Social History Main Topics  . Smoking status: Current Some Day Smoker    Packs/day: 1.00    Types: Cigarettes  . Smokeless tobacco: Current User    Types: Snuff  . Alcohol use 0.0 oz/week     Comment: every 6 months  . Drug use: No  . Sexual activity: Yes    Birth control/ protection: Pill     Comment: lives with partner, works for [employer], no diewtary restrictions.    Other Topics Concern  . Not on file   Social History Narrative  . No narrative on file    Outpatient Medications Prior to Visit  Medication Sig Dispense Refill  . ibuprofen (ADVIL,MOTRIN) 800 MG tablet Take 1 tablet (800 mg total) by mouth every 8 (eight) hours as needed. 30 tablet 1  . montelukast (  SINGULAIR) 10 MG tablet Take 1 tablet (10 mg total) by mouth at bedtime. 30 tablet 3  . ranitidine (ZANTAC) 150 MG tablet Take 1 tablet (150 mg total) by mouth 2 (two) times daily. 180 tablet 1  . valACYclovir (VALTREX) 1000 MG tablet 2 tab by mouth in AM and 2 tabs by mouth 12 hrs later at start of cold sore (Patient taking differently: 2 tab by mouth in AM and 2 tabs by mouth 12 hrs later at start of cold sore as needed) 20 tablet 0  . venlafaxine (EFFEXOR) 75 MG tablet Take 1 tablet (75 mg total) by mouth daily. Take with 150 to equal total of 225 30 tablet 6  . venlafaxine XR (EFFEXOR-XR) 150 MG 24 hr capsule Take 1 capsule (150 mg total) by mouth daily with breakfast. Take with 75 to equal 225 mg 30 capsule 2  . zolpidem (AMBIEN) 5 MG tablet Take 1 tablet (5 mg total) by mouth at bedtime as needed  for sleep. 30 tablet 0  . cefdinir (OMNICEF) 300 MG capsule Take 1 capsule (300 mg total) by mouth 2 (two) times daily. (Patient not taking: Reported on 10/15/2015) 14 capsule 0  . metFORMIN (GLUCOPHAGE-XR) 750 MG 24 hr tablet Take 1 tablet by mouth daily. Increased to 1000 mg    . ondansetron (ZOFRAN) 4 MG tablet Take 1 tablet (4 mg total) by mouth every 6 (six) hours. (Patient not taking: Reported on 10/15/2015) 12 tablet 0   No facility-administered medications prior to visit.     Allergies  Allergen Reactions  . Oxycodone Nausea Only    Oxycontin also  . Trazodone And Nefazodone     Headache , "feels hung over"    Review of Systems  Constitutional: Positive for malaise/fatigue. Negative for chills and fever.  HENT: Negative for congestion and hearing loss.   Eyes: Negative for discharge.  Respiratory: Negative for cough, sputum production and shortness of breath.   Cardiovascular: Negative for chest pain, palpitations and leg swelling.  Gastrointestinal: Negative for abdominal pain, blood in stool, constipation, diarrhea, heartburn, nausea and vomiting.  Genitourinary: Negative for dysuria, frequency, hematuria and urgency.  Musculoskeletal: Negative for back pain, falls and myalgias.  Skin: Negative for rash.  Neurological: Negative for dizziness, sensory change, loss of consciousness, weakness and headaches.  Endo/Heme/Allergies: Negative for environmental allergies. Does not bruise/bleed easily.  Psychiatric/Behavioral: Positive for depression. Negative for suicidal ideas. The patient is nervous/anxious. The patient does not have insomnia.        Objective:    Physical Exam  Constitutional: She is oriented to person, place, and time. She appears well-developed and well-nourished. No distress.  HENT:  Head: Normocephalic and atraumatic.  Eyes: Conjunctivae are normal.  Neck: Neck supple. No thyromegaly present.  Cardiovascular: Normal rate, regular rhythm and normal heart  sounds.   No murmur heard. Pulmonary/Chest: Effort normal and breath sounds normal. No respiratory distress.  Abdominal: Soft. Bowel sounds are normal. She exhibits no distension and no mass. There is no tenderness.  Musculoskeletal: She exhibits no edema.  Lymphadenopathy:    She has no cervical adenopathy.  Neurological: She is alert and oriented to person, place, and time.  Skin: Skin is warm and dry.  Slight discharge from umbilicus. No skin breakdown or fluctuance  Psychiatric: She has a normal mood and affect. Her behavior is normal.    BP 122/82 (BP Location: Right Leg, Patient Position: Sitting, Cuff Size: Large)   Pulse 80   Temp 98.4 F (36.9  C) (Oral)   Ht 5\' 9"  (1.753 m)   Wt 239 lb 2 oz (108.5 kg)   LMP 10/07/2015   SpO2 98%   BMI 35.31 kg/m  Wt Readings from Last 3 Encounters:  10/15/15 239 lb 2 oz (108.5 kg)  10/09/15 243 lb 4 oz (110.3 kg)  08/22/15 245 lb 6.4 oz (111.3 kg)     Lab Results  Component Value Date   WBC 10.9 (H) 04/15/2015   HGB 14.0 04/15/2015   HCT 41.0 04/15/2015   PLT 367.0 04/15/2015   GLUCOSE 99 03/03/2015   CHOL 195 04/15/2015   TRIG 339.0 (H) 04/15/2015   HDL 37.50 (L) 04/15/2015   LDLDIRECT 117.0 04/15/2015   ALT 76 (H) 09/13/2014   AST 45 (H) 09/13/2014   NA 142 03/03/2015   K 4.0 03/03/2015   CL 108 03/03/2015   CREATININE 0.88 03/03/2015   BUN 12 03/03/2015   CO2 23 03/03/2015   TSH 2.84 04/15/2015    Lab Results  Component Value Date   TSH 2.84 04/15/2015   Lab Results  Component Value Date   WBC 10.9 (H) 04/15/2015   HGB 14.0 04/15/2015   HCT 41.0 04/15/2015   MCV 88.7 04/15/2015   PLT 367.0 04/15/2015   Lab Results  Component Value Date   NA 142 03/03/2015   K 4.0 03/03/2015   CO2 23 03/03/2015   GLUCOSE 99 03/03/2015   BUN 12 03/03/2015   CREATININE 0.88 03/03/2015   BILITOT 0.3 09/13/2014   ALKPHOS 61 09/13/2014   AST 45 (H) 09/13/2014   ALT 76 (H) 09/13/2014   PROT 6.9 09/13/2014   ALBUMIN 4.2  09/13/2014   CALCIUM 9.2 03/03/2015   ANIONGAP 11 03/03/2015   GFR 90.93 09/13/2014   Lab Results  Component Value Date   CHOL 195 04/15/2015   Lab Results  Component Value Date   HDL 37.50 (L) 04/15/2015   No results found for: [facility] Lab Results  Component Value Date   TRIG 339.0 (H) 04/15/2015   Lab Results  Component Value Date   CHOLHDL 5 04/15/2015   No results found for: HGBA1C     Assessment & Plan:   Problem List Items Addressed This Visit    GERD (gastroesophageal reflux disease)    Avoid offending foods, start probiotics. Do not eat large meals in late evening and consider raising head of bed.       Bipolar disorder ([place])    Previously diagnosed with depression and anxiety. Screen today is positive for bipolar disorder. Started on Seroquel in addition to her Venlafaxine and see her in rapid follow up. Call if any concerns.      PCOS (polycystic ovarian syndrome)    Continue Metformin      Obesity    Encouraged DASH diet, decrease po intake and increase exercise as tolerated. Needs 7-8 hours of sleep nightly. Avoid trans fats, eat small, frequent meals every 4-5 hours with lean proteins, complex carbs and healthy fats. Minimize simple carbs      Preventative health care    Patient encouraged to maintain heart healthy diet, regular exercise, adequate sleep. Consider daily probiotics. Take medications as prescribed.       Dysmenorrhea    OCPs have been somewhat helpful.       Relevant Medications   Levonorgestrel-Ethinyl Estradiol (AMETHIA,CAMRESE) 0.15-0.03 &0.01 MG tablet   Abscess, umbilical - Primary    Improving with recent antibiotic, keep appointment with general surgery. Cleans daily with mild soap  and Witch Hazel astringent.        Other Visit Diagnoses   None.     I am having Ms. Mira start on Levonorgestrel-Ethinyl Estradiol. I am also having her maintain her ibuprofen, valACYclovir, ondansetron, metFORMIN, ranitidine, zolpidem,  venlafaxine, venlafaxine XR, montelukast, and cefdinir.  Meds ordered this encounter  Medications  . Levonorgestrel-Ethinyl Estradiol (AMETHIA,CAMRESE) 0.15-0.03 &0.01 MG tablet    Sig: Take 1 tablet by mouth daily.    Dispense:  1 Package    Refill:  4     Penni Homans, MD

## 2015-10-27 NOTE — Assessment & Plan Note (Signed)
Patient encouraged to maintain heart healthy diet, regular exercise, adequate sleep. Consider daily probiotics. Take medications as prescribed 

## 2015-11-11 ENCOUNTER — Ambulatory Visit (INDEPENDENT_AMBULATORY_CARE_PROVIDER_SITE_OTHER): Payer: BLUE CROSS/BLUE SHIELD | Admitting: Family Medicine

## 2015-11-11 ENCOUNTER — Encounter: Payer: Self-pay | Admitting: Family Medicine

## 2015-11-11 VITALS — BP 130/88 | HR 89 | Temp 99.0°F | Ht 69.0 in | Wt 243.8 lb

## 2015-11-11 DIAGNOSIS — J189 Pneumonia, unspecified organism: Secondary | ICD-10-CM

## 2015-11-11 MED ORDER — AZITHROMYCIN 250 MG PO TABS: ORAL_TABLET | ORAL | 0 refills | Status: DC

## 2015-11-11 MED ORDER — ALBUTEROL SULFATE HFA 108 (90 BASE) MCG/ACT IN AERS: 2.0000 | INHALATION_SPRAY | Freq: Four times a day (QID) | RESPIRATORY_TRACT | 0 refills | Status: DC | PRN

## 2015-11-11 NOTE — Patient Instructions (Signed)
Try the albuterol for 2-3 days and see if symptoms improve. If not, take the antibiotic.   Claritin (loratadine), Allegra (fexofenadine), Zyrtec (cetirizine); these are listed in order from weakest to strongest. Generic, and therefore cheaper, options are in the parentheses.   Flonase (fluticasone); nasal spray that is over the counter. 2 sprays each nostril, once daily. Aim towards the same side eye when you spray.  There are available OTC, and the generic versions, which may be cheaper, are in parentheses. Show this to a pharmacist if you have trouble finding any of these items.

## 2015-11-11 NOTE — Progress Notes (Signed)
Chief Complaint  Patient presents with  . Cough    (product-yellow,green),wheezing,trouble breathing-sxs started 2 weeks ago    Kathryn [name] here for URI complaints.  Duration: 2 weeks Associated symptoms: sinus congestion, rhinorrhea, chills and productive cough Denies: sinus pain, red eyes, ear pain, ear drainage and sore throat Treatment to date: OTC cough/cold product of patient's choice PRN and fluids and rest Sick contacts: Yes  She is a smoker.  ROS:  Const: Denies fevers HEENT: As noted in HPI Lungs: No SOB  Past Medical History:  Diagnosis Date  . Allergy   . Anxiety   . Bipolar disorder ([place]) 2014/04/18   PTSD s/p death of best friend   . Depression   . Depression with anxiety 04-18-14  . GERD (gastroesophageal reflux disease)   . 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 Migraine   . Obesity 04/15/2015  . Ovarian cyst   . PCOS (polycystic ovarian syndrome)   . Preventative health care 10/27/2015   Family History  Problem Relation Age of Onset  . Hypertension Mother   . Heart disease Mother     CAD, stent at 86, s/p cardiac arrest with Defib  . Hyperlipidemia Mother   . Diabetes Mother     s/p gastric bypass  . Obesity Mother     s/p gastric bypass  . Hyperlipidemia Father   . Hypertension Father   . Asthma Sister   . Hyperlipidemia Brother   . Stroke Maternal Grandmother   . Thyroid disease Maternal Grandmother   . Heart disease Maternal Grandfather     MI first at 78, s/p stents and CABG  . Hyperlipidemia Maternal Grandfather   . Hypertension Maternal Grandfather   . Stroke Maternal Grandfather   . Cancer Maternal Grandfather     melanoma  . Aneurysm Maternal Grandfather     brain  . Kidney disease Maternal Grandfather     tumor  . Heart disease Paternal Grandmother   . Cancer Paternal Lucilla Edin and brain  . Heart disease Maternal Uncle     BP 130/88 (BP Location: Left Arm, Patient Position: Sitting, Cuff Size: Normal)   Pulse 89   Temp 99  F (37.2 C) (Oral)   Ht 5\' 9"  (1.753 m)   Wt 243 lb 12.8 oz (110.6 kg) Comment: Pt has on steel toe boots on.  LMP 10/01/2015 (Approximate)   SpO2 98%   BMI 36.00 kg/m  General: Awake, alert, appears stated age 20: AT, [place], ears patent b/l and TM's neg, nares patent w/o discharge, pharynx pink and without exudates, MMM Neck: No masses or asymmetry Heart: RRR, no murmurs, no bruits Lungs: CTAB, no accessory muscle use Psych: Age appropriate judgment and insight, normal mood and affect  Walking pneumonia - Plan: azithromycin (ZITHROMAX) 250 MG tablet, albuterol (PROVENTIL HFA;VENTOLIN HFA) 108 (90 Base) MCG/ACT inhaler  Orders as above. Try albuterol and OTC anti-allergy medication for a few days, if no better, use abx. Letter for work given. F/u prn. Pt voiced understanding and agreement to the plan.  [place], DO 11/11/15 2:22 PM

## 2015-11-11 NOTE — Progress Notes (Signed)
Pre visit review using our clinic review tool, if applicable. No additional management support is needed unless otherwise documented below in the visit note. 

## 2015-12-09 ENCOUNTER — Telehealth: Payer: Self-pay | Admitting: Family Medicine

## 2015-12-09 NOTE — Telephone Encounter (Signed)
OK to refill Ambien same strength, same sig, #30 with 2 rf

## 2015-12-09 NOTE — Telephone Encounter (Signed)
Last refill on 07/11/2015  #30 with 0 refills Last office visit 10/15/2015 No UDS/contract

## 2015-12-09 NOTE — Telephone Encounter (Signed)
Caller name: [name] Relationship to patient: self Can be reached: [phone] Pharmacy: [place], [place]. [address]  Reason for call: pt asking for ambien refill. She is out. States she has been using almost daily but before was mainly taking on weekends.

## 2015-12-10 MED ORDER — ZOLPIDEM TARTRATE 5 MG PO TABS: 5.0000 mg | ORAL_TABLET | Freq: Every evening | ORAL | 2 refills | Status: DC | PRN

## 2015-12-10 NOTE — Telephone Encounter (Signed)
Faxed hardcopy to [facility].

## 2015-12-26 ENCOUNTER — Telehealth: Payer: Self-pay | Admitting: Family Medicine

## 2015-12-26 MED ORDER — IBUPROFEN 800 MG PO TABS: 800.0000 mg | ORAL_TABLET | Freq: Three times a day (TID) | ORAL | 1 refills | Status: DC | PRN

## 2015-12-26 NOTE — Telephone Encounter (Signed)
Patient is calling requesting a refill of ibuprofen (ADVIL,MOTRIN) 800 MG tablet. Please advise.   Pharmacy:  [facility] - [place], [place] [address]

## 2016-01-02 ENCOUNTER — Other Ambulatory Visit: Payer: Self-pay | Admitting: Family Medicine

## 2016-01-02 DIAGNOSIS — J029 Acute pharyngitis, unspecified: Secondary | ICD-10-CM

## 2016-01-03 ENCOUNTER — Telehealth: Payer: Self-pay | Admitting: Family Medicine

## 2016-01-03 DIAGNOSIS — J029 Acute pharyngitis, unspecified: Secondary | ICD-10-CM

## 2016-01-03 MED ORDER — RANITIDINE HCL 150 MG PO TABS: 150.0000 mg | ORAL_TABLET | Freq: Two times a day (BID) | ORAL | 1 refills | Status: DC

## 2016-01-03 MED ORDER — MONTELUKAST SODIUM 10 MG PO TABS: 10.0000 mg | ORAL_TABLET | Freq: Every day | ORAL | 6 refills | Status: DC

## 2016-01-03 NOTE — Telephone Encounter (Signed)
Sent in prescriptions as requested.

## 2016-01-03 NOTE — Telephone Encounter (Signed)
Patient is requesting a refill of ranitidine (ZANTAC) 150 MG tablet and montelukast (SINGULAIR) 10 MG tablet. Please advise  Pharmacy:  [facility] [address] - [place], [place] [address]

## 2016-01-06 ENCOUNTER — Telehealth: Payer: Self-pay | Admitting: Family Medicine

## 2016-01-06 DIAGNOSIS — J029 Acute pharyngitis, unspecified: Secondary | ICD-10-CM

## 2016-01-06 MED ORDER — VENLAFAXINE HCL ER 150 MG PO CP24: 150.0000 mg | ORAL_CAPSULE | Freq: Every day | ORAL | 2 refills | Status: DC

## 2016-01-06 MED ORDER — VALACYCLOVIR HCL 1 G PO TABS: ORAL_TABLET | ORAL | 0 refills | Status: DC

## 2016-01-06 NOTE — Telephone Encounter (Signed)
Sent in prescriptions and clarified with the patient what she needed.

## 2016-01-06 NOTE — Telephone Encounter (Signed)
Patient is requesting a refill of valACYclovir (VALTREX) 1000 MG tablet and venlafaxine (EFFEXOR) 75 MG tablet Please advise.  Pharmacy:  [facility] - [place], [place] [address]

## 2016-01-07 ENCOUNTER — Ambulatory Visit (INDEPENDENT_AMBULATORY_CARE_PROVIDER_SITE_OTHER): Payer: BLUE CROSS/BLUE SHIELD | Admitting: Family

## 2016-01-07 ENCOUNTER — Encounter: Payer: Self-pay | Admitting: Family

## 2016-01-07 VITALS — BP 151/109 | HR 107 | Temp 98.6°F | Resp 20 | Ht 69.0 in | Wt 238.0 lb

## 2016-01-07 DIAGNOSIS — J209 Acute bronchitis, unspecified: Secondary | ICD-10-CM | POA: Diagnosis not present

## 2016-01-07 DIAGNOSIS — F411 Generalized anxiety disorder: Secondary | ICD-10-CM

## 2016-01-07 DIAGNOSIS — I10 Essential (primary) hypertension: Secondary | ICD-10-CM | POA: Diagnosis not present

## 2016-01-07 MED ORDER — MOMETASONE FURO-FORMOTEROL FUM 100-5 MCG/ACT IN AERO: 2.0000 | INHALATION_SPRAY | Freq: Two times a day (BID) | RESPIRATORY_TRACT | 1 refills | Status: DC

## 2016-01-07 MED ORDER — DOXYCYCLINE HYCLATE 100 MG PO TABS: 100.0000 mg | ORAL_TABLET | Freq: Two times a day (BID) | ORAL | 0 refills | Status: DC

## 2016-01-07 MED ORDER — ALPRAZOLAM 0.25 MG PO TABS: 0.2500 mg | ORAL_TABLET | Freq: Two times a day (BID) | ORAL | 0 refills | Status: DC | PRN

## 2016-01-07 MED ORDER — HYDROCHLOROTHIAZIDE 25 MG PO TABS: 25.0000 mg | ORAL_TABLET | Freq: Every day | ORAL | 3 refills | Status: DC

## 2016-01-07 NOTE — Patient Instructions (Addendum)
Begin Dulera twice daily. Use albuterol 2 puffs every 6 hours for the next few days then as needed. Begin doxycycline for bronchitis. For anxiety you may use alprazolam every 6 hours as needed for severe anxiety symptoms. Begin hctz once daily for blood pressure in the AM  Call if new/worsening symptoms or if you are not improved in 3 days.

## 2016-01-07 NOTE — Progress Notes (Signed)
Pre visit review using our clinic review tool, if applicable. No additional management support is needed unless otherwise documented below in the visit note. 

## 2016-01-07 NOTE — Progress Notes (Signed)
Subjective:    Patient ID: Kathryn [name], female    DOB: [birth year], 20 y.o.   MRN: [id number]  HPI  Kathryn [name]  is a 20 yr old female who presents today to discuss two concerns:  1) Anxiety-  Pt reports that her grandfather passed away recently. Reports that this happened suddenly (he had a ruptured aortic aneurysm).  Reports prior to this her mood and anxiety have been well controlled on seroquel and effexor.  Anxiety has suddenly worsened. She will drive with a friend to be with her family in [place] and help with arrangements.    2)  Cough- reports intermittent productive cough x 1 month.  Has had some blood tinged mucous.  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 Has tried dayquil/nyquil.  Feels like congestin has moved into her chest. Denies known fever.    Review of Systems See HPI  Past Medical History:  Diagnosis Date  . Allergy   . Anxiety   . Bipolar disorder ([place]) 04-23-2014   PTSD s/p death of best friend   . Depression   . Depression with anxiety 23-Apr-2014  . GERD (gastroesophageal reflux disease)   . 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 Migraine   . Obesity 04/15/2015  . Ovarian cyst   . PCOS (polycystic ovarian syndrome)   . Preventative health care 10/27/2015     Social History   Social History  . Marital status: Single    Spouse name: N/A  . Number of children: N/A  . Years of education: N/A   Occupational History  . Not on file.   Social History Main Topics  . Smoking status: Current Some Day Smoker    Packs/day: 1.00    Types: Cigarettes  . Smokeless tobacco: Current User    Types: Snuff  . Alcohol use 0.0 oz/week     Comment: every 6 months  . Drug use: No  . Sexual activity: Yes    Birth control/ protection: Pill     Comment: lives with partner, works for [employer], no diewtary restrictions.    Other Topics Concern  . Not on file   Social History Narrative  . No narrative on file    Past Surgical  History:  Procedure Laterality Date  . ADENOIDECTOMY    . Adnoids    . TYMPANOSTOMY TUBE PLACEMENT    . TYMPANOSTOMY TUBE PLACEMENT      Family History  Problem Relation Age of Onset  . Hypertension Mother   . Heart disease Mother     CAD, stent at 20, s/p cardiac arrest with Defib  . Hyperlipidemia Mother   . Diabetes Mother     s/p gastric bypass  . Obesity Mother     s/p gastric bypass  . Hyperlipidemia Father   . Hypertension Father   . Asthma Sister   . Hyperlipidemia Brother   . Stroke Maternal Grandmother   . Thyroid disease Maternal Grandmother   . Heart disease Maternal Grandfather     MI first at 67, s/p stents and CABG  . Hyperlipidemia Maternal Grandfather   . Hypertension Maternal Grandfather   . Stroke Maternal Grandfather   . Cancer Maternal Grandfather     melanoma  . Aneurysm Maternal Grandfather     brain  . Kidney disease Maternal Grandfather     tumor  . Heart disease Paternal Grandmother   . Cancer Paternal Lucilla Edin and brain  .  Heart disease Maternal Uncle     Allergies  Allergen Reactions  . Oxycodone Nausea Only    Oxycontin also  . Trazodone And Nefazodone     Headache , "feels hung over"    Current Outpatient Prescriptions on File Prior to Visit  Medication Sig Dispense Refill  . albuterol (PROVENTIL HFA;VENTOLIN HFA) 108 (90 Base) MCG/ACT inhaler Inhale 2 puffs into the lungs every 6 (six) hours as needed for wheezing or shortness of breath. 1 Inhaler 0  . ibuprofen (ADVIL,MOTRIN) 800 MG tablet Take 1 tablet (800 mg total) by mouth every 8 (eight) hours as needed. 30 tablet 1  . Levonorgestrel-Ethinyl Estradiol (AMETHIA,CAMRESE) 0.15-0.03 &0.01 MG tablet Take 1 tablet by mouth daily. 1 Package 4  . montelukast (SINGULAIR) 10 MG tablet Take 1 tablet (10 mg total) by mouth at bedtime. 30 tablet 6  . ondansetron (ZOFRAN) 4 MG tablet Take 1 tablet (4 mg total) by mouth every 6 (six) hours. 12 tablet 0  . QUEtiapine (SEROQUEL)  100 MG tablet Take 1/2 tablet first night, take 1 tablet the second night, take 2 tablets the third night and then take 3 tablets at night. 90 tablet 0  . ranitidine (ZANTAC) 150 MG tablet Take 1 tablet (150 mg total) by mouth 2 (two) times daily. 180 tablet 1  . valACYclovir (VALTREX) 1000 MG tablet 2 tab by mouth in AM and 2 tabs by mouth 12 hrs later at start of cold sore 20 tablet 0  . venlafaxine (EFFEXOR) 75 MG tablet Take 1 tablet (75 mg total) by mouth daily. Take with 150 to equal total of 225 30 tablet 6  . venlafaxine XR (EFFEXOR-XR) 150 MG 24 hr capsule Take 1 capsule (150 mg total) by mouth daily with breakfast. Take with 75 to equal 225 mg 30 capsule 2  . zolpidem (AMBIEN) 5 MG tablet Take 1 tablet (5 mg total) by mouth at bedtime as needed for sleep. 30 tablet 2   No current facility-administered medications on file prior to visit.     BP (!) 151/109 (BP Location: Left Arm, Cuff Size: Large)   Pulse (!) 107   Temp 98.6 F (37 C) (Oral)   Resp 20   Ht 5\' 9"  (1.753 m)   Wt 238 lb (108 kg)   LMP 10/07/2015 (Approximate) Comment: oral contraceptive  SpO2 99% Comment: room air  BMI 35.15 kg/m       Objective:   Physical Exam  Constitutional: She is oriented to person, place, and time. She appears well-developed and well-nourished.  HENT:  Head: Normocephalic and atraumatic.  Right Ear: Tympanic membrane and ear canal normal.  Left Ear: Tympanic membrane and ear canal normal.  Cardiovascular: Normal rate, regular rhythm and normal heart sounds.   No murmur heard. Pulmonary/Chest: Effort normal.  Soft left sided expiratory wheeze.  Coarse upper airway rhochi  Abdominal: Soft.  Musculoskeletal: She exhibits no edema.  Neurological: She is alert and oriented to person, place, and time.  Skin: Skin is warm and dry.  Psychiatric: She has a normal mood and affect. Her behavior is normal. Judgment and thought content normal.          Assessment & Plan:    HTN- BP  has been rising over the last few months.   Will initiate hctz. She is advised to follow back up in 2 weeks.   BP Readings from Last 3 Encounters:  01/07/16 (!) 151/109  11/11/15 130/88  10/15/15 122/82   Bronchitis with bronchospasm-  trial of dulera, rx with doxycycline. Discussed importance of smoking cessation. Has albuterol which she is instructed to continue prn.   Anxiety- Deteriorated due to recent death in family. rx provided for short course of prn xanax.  Continue seroquel and effexor.

## 2016-01-21 ENCOUNTER — Encounter: Payer: Self-pay | Admitting: Family Medicine

## 2016-01-21 ENCOUNTER — Ambulatory Visit (INDEPENDENT_AMBULATORY_CARE_PROVIDER_SITE_OTHER): Payer: BLUE CROSS/BLUE SHIELD | Admitting: Family Medicine

## 2016-01-21 VITALS — BP 142/100 | HR 88 | Temp 98.8°F | Ht 68.0 in | Wt 240.0 lb

## 2016-01-21 DIAGNOSIS — R945 Abnormal results of liver function studies: Secondary | ICD-10-CM

## 2016-01-21 DIAGNOSIS — R252 Cramp and spasm: Secondary | ICD-10-CM | POA: Diagnosis not present

## 2016-01-21 DIAGNOSIS — E782 Mixed hyperlipidemia: Secondary | ICD-10-CM | POA: Diagnosis not present

## 2016-01-21 DIAGNOSIS — E6609 Other obesity due to excess calories: Secondary | ICD-10-CM

## 2016-01-21 DIAGNOSIS — I10 Essential (primary) hypertension: Secondary | ICD-10-CM | POA: Diagnosis not present

## 2016-01-21 DIAGNOSIS — R7989 Other specified abnormal findings of blood chemistry: Secondary | ICD-10-CM

## 2016-01-21 DIAGNOSIS — F3160 Bipolar disorder, current episode mixed, unspecified: Secondary | ICD-10-CM

## 2016-01-21 MED ORDER — VENLAFAXINE HCL ER 75 MG PO CP24: 75.0000 mg | ORAL_CAPSULE | Freq: Every day | ORAL | 5 refills | Status: DC

## 2016-01-21 MED ORDER — ALPRAZOLAM 0.25 MG PO TABS: 0.2500 mg | ORAL_TABLET | Freq: Two times a day (BID) | ORAL | 1 refills | Status: DC | PRN

## 2016-01-21 MED ORDER — METOPROLOL SUCCINATE ER 25 MG PO TB24: 25.0000 mg | ORAL_TABLET | Freq: Every day | ORAL | 3 refills | Status: DC

## 2016-01-21 NOTE — Patient Instructions (Signed)
Hypertension Hypertension, commonly called high blood pressure, is when the force of blood pumping through your arteries is too strong. Your arteries are the blood vessels that carry blood from your heart throughout your body. A blood pressure reading consists of a higher number over a lower number, such as 110/72. The higher number (systolic) is the pressure inside your arteries when your heart pumps. The lower number (diastolic) is the pressure inside your arteries when your heart relaxes. Ideally you want your blood pressure below 120/80. Hypertension forces your heart to work harder to pump blood. Your arteries may become narrow or stiff. Having untreated or uncontrolled hypertension can cause heart attack, stroke, kidney disease, and other problems. What increases the risk? Some risk factors for high blood pressure are controllable. Others are not. Risk factors you cannot control include:  Race. You may be at higher risk if you are African American.  Age. Risk increases with age.  Gender. Men are at higher risk than women before age 45 years. After age 65, women are at higher risk than men. Risk factors you can control include:  Not getting enough exercise or physical activity.  Being overweight.  Getting too much fat, sugar, calories, or salt in your diet.  Drinking too much alcohol. What are the signs or symptoms? Hypertension does not usually cause signs or symptoms. Extremely high blood pressure (hypertensive crisis) may cause headache, anxiety, shortness of breath, and nosebleed. How is this diagnosed? To check if you have hypertension, your health care provider will measure your blood pressure while you are seated, with your arm held at the level of your heart. It should be measured at least twice using the same arm. Certain conditions can cause a difference in blood pressure between your right and left arms. A blood pressure reading that is higher than normal on one occasion does  not mean that you need treatment. If it is not clear whether you have high blood pressure, you may be asked to return on a different day to have your blood pressure checked again. Or, you may be asked to monitor your blood pressure at home for 1 or more weeks. How is this treated? Treating high blood pressure includes making lifestyle changes and possibly taking medicine. Living a healthy lifestyle can help lower high blood pressure. You may need to change some of your habits. Lifestyle changes may include:  Following the DASH diet. This diet is high in fruits, vegetables, and whole grains. It is low in salt, red meat, and added sugars.  Keep your sodium intake below 2,300 mg per day.  Getting at least 30-45 minutes of aerobic exercise at least 4 times per week.  Losing weight if necessary.  Not smoking.  Limiting alcoholic beverages.  Learning ways to reduce stress. Your health care provider may prescribe medicine if lifestyle changes are not enough to get your blood pressure under control, and if one of the following is true:  You are 18-59 years of age and your systolic blood pressure is above 140.  You are 60 years of age or older, and your systolic blood pressure is above 150.  Your diastolic blood pressure is above 90.  You have diabetes, and your systolic blood pressure is over 140 or your diastolic blood pressure is over 90.  You have kidney disease and your blood pressure is above 140/90.  You have heart disease and your blood pressure is above 140/90. Your personal target blood pressure may vary depending on your medical   conditions, your age, and other factors. Follow these instructions at home:  Have your blood pressure rechecked as directed by your health care provider.  Take medicines only as directed by your health care provider. Follow the directions carefully. Blood pressure medicines must be taken as prescribed. The medicine does not work as well when you skip  doses. Skipping doses also puts you at risk for problems.  Do not smoke.  Monitor your blood pressure at home as directed by your health care provider. Contact a health care provider if:  You think you are having a reaction to medicines taken.  You have recurrent headaches or feel dizzy.  You have swelling in your ankles.  You have trouble with your vision. Get help right away if:  You develop a severe headache or confusion.  You have unusual weakness, numbness, or feel faint.  You have severe chest or abdominal pain.  You vomit repeatedly.  You have trouble breathing. This information is not intended to replace advice given to you by your health care provider. Make sure you discuss any questions you have with your health care provider. Document Released: 02/02/2005 Document Revised: 07/11/2015 Document Reviewed: 11/25/2012 Elsevier Interactive Patient Education  2017 Elsevier Inc.  

## 2016-01-21 NOTE — Progress Notes (Signed)
Pre visit review using our clinic review tool, if applicable. No additional management support is needed unless otherwise documented below in the visit note. 

## 2016-01-22 ENCOUNTER — Other Ambulatory Visit (INDEPENDENT_AMBULATORY_CARE_PROVIDER_SITE_OTHER): Payer: BLUE CROSS/BLUE SHIELD

## 2016-01-22 DIAGNOSIS — E782 Mixed hyperlipidemia: Secondary | ICD-10-CM | POA: Diagnosis not present

## 2016-01-22 DIAGNOSIS — R7989 Other specified abnormal findings of blood chemistry: Secondary | ICD-10-CM

## 2016-01-22 DIAGNOSIS — I10 Essential (primary) hypertension: Secondary | ICD-10-CM

## 2016-01-22 DIAGNOSIS — R252 Cramp and spasm: Secondary | ICD-10-CM | POA: Diagnosis not present

## 2016-01-23 LAB — COMPREHENSIVE METABOLIC PANEL
ALT: 76 U/L — ABNORMAL HIGH (ref 0–35)
AST: 59 U/L — ABNORMAL HIGH (ref 0–37)
Albumin: 4.3 g/dL (ref 3.5–5.2)
Alkaline Phosphatase: 74 U/L (ref 39–117)
BUN: 14 mg/dL (ref 6–23)
CO2: 27 mEq/L (ref 19–32)
Calcium: 9.6 mg/dL (ref 8.4–10.5)
Chloride: 102 mEq/L (ref 96–112)
Creatinine, Ser: 0.96 mg/dL (ref 0.40–1.20)
GFR: 77.95 mL/min (ref >60.00)
Glucose, Bld: 79 mg/dL (ref 70–99)
Potassium: 3.5 mEq/L (ref 3.5–5.1)
Sodium: 139 mEq/L (ref 135–145)
Total Bilirubin: 0.4 mg/dL (ref 0.2–1.2)
Total Protein: 7.3 g/dL (ref 6.0–8.3)

## 2016-01-23 LAB — LIPID PANEL
Cholesterol: 205 mg/dL — ABNORMAL HIGH (ref 0–200)
HDL: 44.3 mg/dL (ref >39.00)
NonHDL: 160.47
Total CHOL/HDL Ratio: 5
Triglycerides: 218 mg/dL — ABNORMAL HIGH (ref 0.0–149.0)
VLDL: 43.6 mg/dL — ABNORMAL HIGH (ref 0.0–40.0)

## 2016-01-23 LAB — TSH: TSH: 1.89 u[IU]/mL (ref 0.35–4.50)

## 2016-01-23 LAB — CBC
HCT: 40.5 % (ref 36.0–46.0)
Hemoglobin: 13.9 g/dL (ref 12.0–15.0)
MCHC: 34.4 g/dL (ref 30.0–36.0)
MCV: 90.2 fl (ref 78.0–100.0)
Platelets: 390 10*3/uL (ref 150.0–400.0)
RBC: 4.49 Mil/uL (ref 3.87–5.11)
RDW: 13.8 % (ref 11.5–15.5)
WBC: 11.8 10*3/uL — ABNORMAL HIGH (ref 4.0–10.5)

## 2016-01-23 LAB — MAGNESIUM: Magnesium: 2.1 mg/dL (ref 1.5–2.5)

## 2016-01-23 LAB — LDL CHOLESTEROL, DIRECT: Direct LDL: 138 mg/dL

## 2016-01-24 ENCOUNTER — Other Ambulatory Visit: Payer: Self-pay | Admitting: Family Medicine

## 2016-01-24 DIAGNOSIS — R945 Abnormal results of liver function studies: Secondary | ICD-10-CM

## 2016-02-02 ENCOUNTER — Encounter: Payer: Self-pay | Admitting: Family Medicine

## 2016-02-02 DIAGNOSIS — E782 Mixed hyperlipidemia: Secondary | ICD-10-CM | POA: Insufficient documentation

## 2016-02-02 DIAGNOSIS — R252 Cramp and spasm: Secondary | ICD-10-CM | POA: Insufficient documentation

## 2016-02-02 DIAGNOSIS — R945 Abnormal results of liver function studies: Secondary | ICD-10-CM | POA: Insufficient documentation

## 2016-02-02 DIAGNOSIS — I10 Essential (primary) hypertension: Secondary | ICD-10-CM

## 2016-02-02 DIAGNOSIS — R7989 Other specified abnormal findings of blood chemistry: Secondary | ICD-10-CM

## 2016-02-02 HISTORY — DX: Other specified abnormal findings of blood chemistry: R79.89

## 2016-02-02 HISTORY — DX: Essential (primary) hypertension: I10

## 2016-02-02 HISTORY — DX: Mixed hyperlipidemia: E78.2

## 2016-02-02 HISTORY — DX: Abnormal results of liver function studies: R94.5

## 2016-02-02 NOTE — Assessment & Plan Note (Signed)
Encouraged increased hydration, magnesium supplements

## 2016-02-02 NOTE — Assessment & Plan Note (Signed)
Encouraged heart healthy diet, increase exercise, avoid trans fats, consider a krill oil cap daily 

## 2016-02-02 NOTE — Assessment & Plan Note (Signed)
Encouraged DASH diet, decrease po intake and increase exercise as tolerated. Needs 7-8 hours of sleep nightly. Avoid trans fats, eat small, frequent meals every 4-5 hours with lean proteins, complex carbs and healthy fats. Minimize simple carbs, GMO foods. 

## 2016-02-02 NOTE — Progress Notes (Signed)
Patient ID: Meron Nohl, female   DOB: 1995-06-23, 20 y.o.   MRN: [id number]   Subjective:    Patient ID: [name], female    DOB: [birth year], 20 y.o.   MRN: [id number]  Chief Complaint  Patient presents with  . Follow-up    HPI Patient is in today for follow up. She is struggling with increased  Leg cramps at times. Both legs involed. Seems to respond some to potassium otc. No recent illness or diarrhea. She notes she has been drinking more alcohol than she used to. She notes feeling flushed at times. Denies CP/palp/SOB/HA/congestion/fevers/GI or GU c/o. Taking meds as prescribed  Past Medical History:  Diagnosis Date  . Abnormal liver function tests 02/02/2016  . Allergy   . Anxiety   . Bipolar disorder ([place]) 2014/04/22   PTSD s/p death of best friend   . Depression   . Depression with anxiety 04/22/14  . Essential hypertension 02/02/2016  . GERD (gastroesophageal reflux disease)   . 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 Hyperlipidemia, mixed 02/02/2016  . Migraine   . Obesity 04/15/2015  . Ovarian cyst   . PCOS (polycystic ovarian syndrome)   . Preventative health care 10/27/2015    Past Surgical History:  Procedure Laterality Date  . ADENOIDECTOMY    . Adnoids    . TYMPANOSTOMY TUBE PLACEMENT    . TYMPANOSTOMY TUBE PLACEMENT      Family History  Problem Relation Age of Onset  . Hypertension Mother   . Heart disease Mother     CAD, stent at 7, s/p cardiac arrest with Defib  . Hyperlipidemia Mother   . Diabetes Mother     s/p gastric bypass  . Obesity Mother     s/p gastric bypass  . Hyperlipidemia Father   . Hypertension Father   . Asthma Sister   . Hyperlipidemia Brother   . Stroke Maternal Grandmother   . Thyroid disease Maternal Grandmother   . Heart disease Maternal Grandfather     MI first at 26, s/p stents and CABG  . Hyperlipidemia Maternal Grandfather   . Hypertension Maternal Grandfather   . Stroke Maternal Grandfather   . Cancer Maternal  Grandfather     melanoma  . Aneurysm Maternal Grandfather     brain  . Kidney disease Maternal Grandfather     tumor  . Heart disease Paternal Grandmother   . Cancer Paternal Lucilla Edin and brain  . Heart disease Maternal Uncle     Social History   Social History  . Marital status: Single    Spouse name: N/A  . Number of children: N/A  . Years of education: N/A   Occupational History  . Not on file.   Social History Main Topics  . Smoking status: Current Some Day Smoker    Packs/day: 1.00    Types: Cigarettes  . Smokeless tobacco: Current User    Types: Snuff  . Alcohol use 0.0 oz/week     Comment: every 6 months  . Drug use: No  . Sexual activity: Yes    Birth control/ protection: Pill     Comment: lives with partner, works for [employer], no diewtary restrictions.    Other Topics Concern  . Not on file   Social History Narrative  . No narrative on file    Outpatient Medications Prior to Visit  Medication Sig Dispense Refill  . albuterol (PROVENTIL HFA;VENTOLIN HFA) 108 (90 Base) MCG/ACT inhaler Inhale 2 puffs into  the lungs every 6 (six) hours as needed for wheezing or shortness of breath. 1 Inhaler 0  . hydrochlorothiazide (HYDRODIURIL) 25 MG tablet Take 1 tablet (25 mg total) by mouth daily. 30 tablet 3  . ibuprofen (ADVIL,MOTRIN) 800 MG tablet Take 1 tablet (800 mg total) by mouth every 8 (eight) hours as needed. 30 tablet 1  . Levonorgestrel-Ethinyl Estradiol (AMETHIA,CAMRESE) 0.15-0.03 &0.01 MG tablet Take 1 tablet by mouth daily. 1 Package 4  . mometasone-formoterol (DULERA) 100-5 MCG/ACT AERO Inhale 2 puffs into the lungs 2 (two) times daily. 1 Inhaler 1  . montelukast (SINGULAIR) 10 MG tablet Take 1 tablet (10 mg total) by mouth at bedtime. 30 tablet 6  . ondansetron (ZOFRAN) 4 MG tablet Take 1 tablet (4 mg total) by mouth every 6 (six) hours. 12 tablet 0  . ranitidine (ZANTAC) 150 MG tablet Take 1 tablet (150 mg total) by mouth 2 (two)  times daily. 180 tablet 1  . valACYclovir (VALTREX) 1000 MG tablet 2 tab by mouth in AM and 2 tabs by mouth 12 hrs later at start of cold sore 20 tablet 0  . venlafaxine XR (EFFEXOR-XR) 150 MG 24 hr capsule Take 1 capsule (150 mg total) by mouth daily with breakfast. Take with 75 to equal 225 mg 30 capsule 2  . zolpidem (AMBIEN) 5 MG tablet Take 1 tablet (5 mg total) by mouth at bedtime as needed for sleep. 30 tablet 2  . doxycycline (VIBRA-TABS) 100 MG tablet Take 1 tablet (100 mg total) by mouth 2 (two) times daily. 14 tablet 0  . QUEtiapine (SEROQUEL) 100 MG tablet Take 1/2 tablet first night, take 1 tablet the second night, take 2 tablets the third night and then take 3 tablets at night. 90 tablet 0  . venlafaxine (EFFEXOR) 75 MG tablet Take 1 tablet (75 mg total) by mouth daily. Take with 150 to equal total of 225 30 tablet 6  . ALPRAZolam (XANAX) 0.25 MG tablet Take 1 tablet (0.25 mg total) by mouth 2 (two) times daily as needed for anxiety. (Patient not taking: Reported on 01/21/2016) 20 tablet 0   No facility-administered medications prior to visit.     Allergies  Allergen Reactions  . Oxycodone Nausea Only    Oxycontin also  . Trazodone And Nefazodone     Headache , "feels hung over"    Review of Systems  Constitutional: Positive for malaise/fatigue. Negative for fever.  HENT: Negative for congestion.   Eyes: Negative for blurred vision.  Respiratory: Negative for shortness of breath.   Cardiovascular: Negative for chest pain, palpitations and leg swelling.  Gastrointestinal: Negative for abdominal pain, blood in stool and nausea.  Genitourinary: Negative for dysuria and frequency.  Musculoskeletal: Negative for falls.  Skin: Negative for rash.  Neurological: Negative for dizziness, loss of consciousness and headaches.  Endo/Heme/Allergies: Negative for environmental allergies.  Psychiatric/Behavioral: Positive for depression. The patient is nervous/anxious.          Objective:    Physical Exam  Constitutional: She is oriented to person, place, and time. She appears well-developed and well-nourished. No distress.  HENT:  Head: Normocephalic and atraumatic.  Nose: Nose normal.  Eyes: Right eye exhibits no discharge. Left eye exhibits no discharge.  Neck: Normal range of motion. Neck supple.  Cardiovascular: Normal rate and regular rhythm.   No murmur heard. Pulmonary/Chest: Effort normal and breath sounds normal.  Abdominal: Soft. Bowel sounds are normal. There is no tenderness.  Musculoskeletal: She exhibits no edema.  Neurological:  She is alert and oriented to person, place, and time.  Skin: Skin is warm and dry.  Psychiatric: She has a normal mood and affect.  Nursing note and vitals reviewed.   BP (!) 142/100 (BP Location: Left Arm)   Pulse 88   Temp 98.8 F (37.1 C) (Oral)   Ht 5\' 8"  (1.727 m)   Wt 240 lb (108.9 kg)   LMP 10/07/2015 (Approximate) Comment: oral contraceptive  SpO2 98%   BMI 36.49 kg/m  Wt Readings from Last 3 Encounters:  01/21/16 240 lb (108.9 kg)  01/07/16 238 lb (108 kg)  11/11/15 243 lb 12.8 oz (110.6 kg)     Lab Results  Component Value Date   WBC 11.8 (H) 01/22/2016   HGB 13.9 01/22/2016   HCT 40.5 01/22/2016   PLT 390.0 01/22/2016   GLUCOSE 79 01/22/2016   CHOL 205 (H) 01/22/2016   TRIG 218.0 (H) 01/22/2016   HDL 44.30 01/22/2016   LDLDIRECT 138.0 01/22/2016   ALT 76 (H) 01/22/2016   AST 59 (H) 01/22/2016   NA 139 01/22/2016   K 3.5 01/22/2016   CL 102 01/22/2016   CREATININE 0.96 01/22/2016   BUN 14 01/22/2016   CO2 27 01/22/2016   TSH 1.89 01/22/2016    Lab Results  Component Value Date   TSH 1.89 01/22/2016   Lab Results  Component Value Date   WBC 11.8 (H) 01/22/2016   HGB 13.9 01/22/2016   HCT 40.5 01/22/2016   MCV 90.2 01/22/2016   PLT 390.0 01/22/2016   Lab Results  Component Value Date   NA 139 01/22/2016   K 3.5 01/22/2016   CO2 27 01/22/2016   GLUCOSE 79 01/22/2016    BUN 14 01/22/2016   CREATININE 0.96 01/22/2016   BILITOT 0.4 01/22/2016   ALKPHOS 74 01/22/2016   AST 59 (H) 01/22/2016   ALT 76 (H) 01/22/2016   PROT 7.3 01/22/2016   ALBUMIN 4.3 01/22/2016   CALCIUM 9.6 01/22/2016   ANIONGAP 11 03/03/2015   GFR 77.95 01/22/2016   Lab Results  Component Value Date   CHOL 205 (H) 01/22/2016   Lab Results  Component Value Date   HDL 44.30 01/22/2016   No results found for: [facility] Lab Results  Component Value Date   TRIG 218.0 (H) 01/22/2016   Lab Results  Component Value Date   CHOLHDL 5 01/22/2016   No results found for: HGBA1C     Assessment & Plan:   Problem List Items Addressed This Visit    Bipolar disorder ([place])    Did not tolerate Seroquel. Testing is borderline. Will try Venlafaxine and reassess at next visit      Obesity    Encouraged DASH diet, decrease po intake and increase exercise as tolerated. Needs 7-8 hours of sleep nightly. Avoid trans fats, eat small, frequent meals every 4-5 hours with lean proteins, complex carbs and healthy fats. Minimize simple carbs, GMO foods.      Leg cramps    Encouraged increased hydration, magnesium supplements      Relevant Orders   Magnesium (Completed)   Essential hypertension - Primary   Relevant Medications   metoprolol succinate (TOPROL-XL) 25 MG 24 hr tablet   Other Relevant Orders   CBC (Completed)   Comprehensive metabolic panel (Completed)   TSH (Completed)   Hyperlipidemia, mixed    Encouraged heart healthy diet, increase exercise, avoid trans fats, consider a krill oil cap daily      Relevant Medications   metoprolol  succinate (TOPROL-XL) 25 MG 24 hr tablet   Other Relevant Orders   Lipid panel (Completed)   Abnormal liver function tests    She has noted increased alcohol intake lately encouraged to cut back and we will monitor.          I have discontinued Ms. Varady's venlafaxine, QUEtiapine, and doxycycline. I am also having her start on venlafaxine  XR and metoprolol succinate. Additionally, I am having her maintain her ondansetron, Levonorgestrel-Ethinyl Estradiol, albuterol, zolpidem, ibuprofen, montelukast, ranitidine, valACYclovir, venlafaxine XR, mometasone-formoterol, hydrochlorothiazide, and ALPRAZolam.  Meds ordered this encounter  Medications  . venlafaxine XR (EFFEXOR XR) 75 MG 24 hr capsule    Sig: Take 1 capsule (75 mg total) by mouth daily with breakfast.    Dispense:  30 capsule    Refill:  5  . metoprolol succinate (TOPROL-XL) 25 MG 24 hr tablet    Sig: Take 1 tablet (25 mg total) by mouth daily.    Dispense:  30 tablet    Refill:  3  . ALPRAZolam (XANAX) 0.25 MG tablet    Sig: Take 1 tablet (0.25 mg total) by mouth 2 (two) times daily as needed for anxiety.    Dispense:  20 tablet    Refill:  1     Penni Homans, MD

## 2016-02-02 NOTE — Assessment & Plan Note (Signed)
She has noted increased alcohol intake lately encouraged to cut back and we will monitor.

## 2016-02-02 NOTE — Assessment & Plan Note (Signed)
Did not tolerate Seroquel. Testing is borderline. Will try Venlafaxine and reassess at next visit

## 2016-02-04 ENCOUNTER — Ambulatory Visit (INDEPENDENT_AMBULATORY_CARE_PROVIDER_SITE_OTHER): Payer: BLUE CROSS/BLUE SHIELD | Admitting: Family Medicine

## 2016-02-04 VITALS — BP 141/92 | HR 97

## 2016-02-04 DIAGNOSIS — I10 Essential (primary) hypertension: Secondary | ICD-10-CM

## 2016-02-04 MED ORDER — METOPROLOL SUCCINATE ER 50 MG PO TB24: 50.0000 mg | ORAL_TABLET | Freq: Every day | ORAL | 3 refills | Status: DC

## 2016-02-04 NOTE — Progress Notes (Signed)
RN blood pressure check note reviewed. Agree with documention and plan. 

## 2016-02-04 NOTE — Progress Notes (Signed)
Pre visit review using our clinic review tool, if applicable. No additional management support is needed unless otherwise documented below in the visit note.  Patient came in office for blood pressure check. Reviewed medications & regimen with the patient. Today's readings were as follow: BP 157/83 P 99 & BP 141/92 P 97. Patient is asymptomatic.  Per Dr. Charlett Blake: Increase Metoprolol Succinate (Toprol-XL) to 50 MG once daily. Continue current regimen with all other medications. Return in 2 weeks for nurse visit to have blood pressure checked.  Informed patient of the provider's instructions. She verbalized understanding & did not have any further concerns prior to leaving the nurse visit.  Next appointment scheduled for 02/20/16 at 4:00 PM.  RN blood pressure check note reviewed. Agree with documention and plan.  Penni Homans, MD

## 2016-02-04 NOTE — Patient Instructions (Addendum)
Per Dr. Charlett Blake: Increase Metoprolol Succinate (Toprol-XL) to 50 MG once daily. Continue current regimen with all other medications. Return in 2 weeks for nurse visit to have blood pressure checked.

## 2016-02-20 ENCOUNTER — Encounter: Payer: Self-pay | Admitting: Family Medicine

## 2016-02-20 ENCOUNTER — Ambulatory Visit (INDEPENDENT_AMBULATORY_CARE_PROVIDER_SITE_OTHER): Payer: BLUE CROSS/BLUE SHIELD | Admitting: Family Medicine

## 2016-02-20 ENCOUNTER — Ambulatory Visit: Payer: BLUE CROSS/BLUE SHIELD | Admitting: Family Medicine

## 2016-02-20 VITALS — BP 118/78 | HR 79 | Temp 99.0°F | Wt 232.0 lb

## 2016-02-20 DIAGNOSIS — Z77011 Contact with and (suspected) exposure to lead: Secondary | ICD-10-CM | POA: Diagnosis not present

## 2016-02-20 DIAGNOSIS — Z789 Other specified health status: Secondary | ICD-10-CM

## 2016-02-20 DIAGNOSIS — I10 Essential (primary) hypertension: Secondary | ICD-10-CM

## 2016-02-20 DIAGNOSIS — E6609 Other obesity due to excess calories: Secondary | ICD-10-CM

## 2016-02-20 DIAGNOSIS — M545 Low back pain: Secondary | ICD-10-CM

## 2016-02-20 DIAGNOSIS — Z8619 Personal history of other infectious and parasitic diseases: Secondary | ICD-10-CM | POA: Diagnosis not present

## 2016-02-20 DIAGNOSIS — K219 Gastro-esophageal reflux disease without esophagitis: Secondary | ICD-10-CM

## 2016-02-20 DIAGNOSIS — F1090 Alcohol use, unspecified, uncomplicated: Secondary | ICD-10-CM

## 2016-02-20 DIAGNOSIS — R945 Abnormal results of liver function studies: Secondary | ICD-10-CM

## 2016-02-20 DIAGNOSIS — Z7289 Other problems related to lifestyle: Secondary | ICD-10-CM

## 2016-02-20 DIAGNOSIS — R202 Paresthesia of skin: Secondary | ICD-10-CM | POA: Diagnosis not present

## 2016-02-20 DIAGNOSIS — R7989 Other specified abnormal findings of blood chemistry: Secondary | ICD-10-CM

## 2016-02-20 MED ORDER — OMEPRAZOLE 40 MG PO CPDR: 40.0000 mg | DELAYED_RELEASE_CAPSULE | Freq: Every day | ORAL | 3 refills | Status: DC

## 2016-02-20 NOTE — Patient Instructions (Signed)
[employer] as needed during the day Vitamin B complex   Paresthesia Introduction Paresthesia is an abnormal burning or prickling sensation. This sensation is generally felt in the hands, arms, legs, or feet. However, it may occur in any part of the body. Usually, it is not painful. The feeling may be described as:  Tingling or numbness.  Pins and needles.  Skin crawling.  Buzzing.  Limbs falling asleep.  Itching. Most people experience temporary (transient) paresthesia at some time in their lives. Paresthesia may occur when you breathe too quickly (hyperventilation). It can also occur without any apparent cause. Commonly, paresthesia occurs when pressure is placed on a nerve. The sensation quickly goes away after the pressure is removed. For some people, however, paresthesia is a long-lasting (chronic) condition that is caused by an underlying disorder. If you continue to have paresthesia, you may need further medical evaluation. Follow these instructions at home: Watch your condition for any changes. Taking the following actions may help to lessen any discomfort that you are feeling:  Avoid drinking alcohol.  Try acupuncture or massage to help relieve your symptoms.  Keep all follow-up visits as directed by your health care provider. This is important. Contact a health care provider if:  You continue to have episodes of paresthesia.  Your burning or prickling feeling gets worse when you walk.  You have pain, cramps, or dizziness.  You develop a rash. Get help right away if:  You feel weak.  You have trouble walking or moving.  You have problems with speech, understanding, or vision.  You feel confused.  You cannot control your bladder or bowel movements.  You have numbness after an injury.  You faint. This information is not intended to replace advice given to you by your health care provider. Make sure you discuss any questions you have with your health  care provider. Document Released: 01/23/2002 Document Revised: 07/11/2015 Document Reviewed: 01/29/2014  2017 Elsevier

## 2016-02-20 NOTE — Progress Notes (Signed)
Pre visit review using our clinic review tool, if applicable. No additional management support is needed unless otherwise documented below in the visit note. 

## 2016-02-20 NOTE — Progress Notes (Signed)
Subjective:    Patient ID: Kathryn [name], female    DOB: 19-Sep-1995, 21 y.o.   MRN: [id number]  No chief complaint on file.   HPI Patient is in today for an acute visit patient complains of tingling in her hands.  Patient stated she fell 3 days ago and can not recall if she hit her head or not. She did not initially have a sense of pins and needles in arms and legs but over past 24 hours she has developed this sensation. She denies any incontinence but does endorse mild low back pain. Does not change intensity with position changes. She acknowledges having an excessive amount of alcohol the days she fell and has not had any alchol since then. She is struggling with hi anxiety and anhedonia as well. Denies /palp/SOB/HA/congestion/fevers/GI or GU c/o. Taking meds as prescribed. Does acknowledge some mild chest wall discomfort since the fall.   Past Medical History:  Diagnosis Date  . Abnormal liver function tests 02/02/2016  . Alcohol use 02/23/2016  . Allergy   . Anxiety   . Bipolar disorder ([place]) 04-19-14   PTSD s/p death of best friend   . Depression   . Depression with anxiety 04/19/14  . Essential hypertension 02/02/2016  . GERD (gastroesophageal reflux disease)   . 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 Hyperlipidemia, mixed 02/02/2016  . Migraine   . Obesity 04/15/2015  . Ovarian cyst   . PCOS (polycystic ovarian syndrome)   . Preventative health care 10/27/2015    Past Surgical History:  Procedure Laterality Date  . ADENOIDECTOMY    . Adnoids    . TYMPANOSTOMY TUBE PLACEMENT    . TYMPANOSTOMY TUBE PLACEMENT      Family History  Problem Relation Age of Onset  . Hypertension Mother   . Heart disease Mother     CAD, stent at 70, s/p cardiac arrest with Defib  . Hyperlipidemia Mother   . Diabetes Mother     s/p gastric bypass  . Obesity Mother     s/p gastric bypass  . Hyperlipidemia Father   . Hypertension Father   . Asthma Sister   . Hyperlipidemia Brother   . Stroke  Maternal Grandmother   . Thyroid disease Maternal Grandmother   . Heart disease Maternal Grandfather     MI first at 91, s/p stents and CABG  . Hyperlipidemia Maternal Grandfather   . Hypertension Maternal Grandfather   . Stroke Maternal Grandfather   . Cancer Maternal Grandfather     melanoma  . Aneurysm Maternal Grandfather     brain  . Kidney disease Maternal Grandfather     tumor  . Heart disease Paternal Grandmother   . Cancer Paternal Lucilla Edin and brain  . Heart disease Maternal Uncle     Social History   Social History  . Marital status: Single    Spouse name: N/A  . Number of children: N/A  . Years of education: N/A   Occupational History  . Not on file.   Social History Main Topics  . Smoking status: Current Some Day Smoker    Packs/day: 1.00    Types: Cigarettes  . Smokeless tobacco: Current User    Types: Snuff  . Alcohol use 0.0 oz/week     Comment: every 6 months  . Drug use: No  . Sexual activity: Yes    Birth control/ protection: Pill     Comment: lives with partner, works for [employer], no  diewtary restrictions.    Other Topics Concern  . Not on file   Social History Narrative  . No narrative on file    Outpatient Medications Prior to Visit  Medication Sig Dispense Refill  . albuterol (PROVENTIL HFA;VENTOLIN HFA) 108 (90 Base) MCG/ACT inhaler Inhale 2 puffs into the lungs every 6 (six) hours as needed for wheezing or shortness of breath. 1 Inhaler 0  . ALPRAZolam (XANAX) 0.25 MG tablet Take 1 tablet (0.25 mg total) by mouth 2 (two) times daily as needed for anxiety. 20 tablet 1  . hydrochlorothiazide (HYDRODIURIL) 25 MG tablet Take 1 tablet (25 mg total) by mouth daily. 30 tablet 3  . ibuprofen (ADVIL,MOTRIN) 800 MG tablet Take 1 tablet (800 mg total) by mouth every 8 (eight) hours as needed. 30 tablet 1  . Levonorgestrel-Ethinyl Estradiol (AMETHIA,CAMRESE) 0.15-0.03 &0.01 MG tablet Take 1 tablet by mouth daily. 1 Package 4  .  metoprolol succinate (TOPROL-XL) 50 MG 24 hr tablet Take 1 tablet (50 mg total) by mouth daily. Take with or immediately following a meal. 30 tablet 3  . mometasone-formoterol (DULERA) 100-5 MCG/ACT AERO Inhale 2 puffs into the lungs 2 (two) times daily. 1 Inhaler 1  . montelukast (SINGULAIR) 10 MG tablet Take 1 tablet (10 mg total) by mouth at bedtime. 30 tablet 6  . ondansetron (ZOFRAN) 4 MG tablet Take 1 tablet (4 mg total) by mouth every 6 (six) hours. 12 tablet 0  . ranitidine (ZANTAC) 150 MG tablet Take 1 tablet (150 mg total) by mouth 2 (two) times daily. 180 tablet 1  . valACYclovir (VALTREX) 1000 MG tablet 2 tab by mouth in AM and 2 tabs by mouth 12 hrs later at start of cold sore 20 tablet 0  . venlafaxine XR (EFFEXOR XR) 75 MG 24 hr capsule Take 1 capsule (75 mg total) by mouth daily with breakfast. 30 capsule 5  . venlafaxine XR (EFFEXOR-XR) 150 MG 24 hr capsule Take 1 capsule (150 mg total) by mouth daily with breakfast. Take with 75 to equal 225 mg 30 capsule 2  . zolpidem (AMBIEN) 5 MG tablet Take 1 tablet (5 mg total) by mouth at bedtime as needed for sleep. 30 tablet 2   No facility-administered medications prior to visit.     Allergies  Allergen Reactions  . Oxycodone Nausea Only    Oxycontin also  . Trazodone And Nefazodone     Headache , "feels hung over"    Review of Systems  Constitutional: Positive for malaise/fatigue. Negative for fever.  HENT: Positive for congestion.   Eyes: Negative for blurred vision.  Respiratory: Negative for cough and shortness of breath.   Cardiovascular: Positive for chest pain. Negative for palpitations.  Gastrointestinal: Negative for vomiting.  Musculoskeletal: Positive for back pain.  Skin: Negative for rash.  Neurological: Positive for tingling. Negative for loss of consciousness and headaches.  Psychiatric/Behavioral: The patient is nervous/anxious.        Objective:    Physical Exam  Constitutional: She is oriented to  person, place, and time. She appears well-developed and well-nourished. No distress.  HENT:  Head: Normocephalic and atraumatic.  Eyes: Conjunctivae are normal.  Neck: Normal range of motion. No thyromegaly present.  Cardiovascular: Normal rate and regular rhythm.   Pulmonary/Chest: Effort normal and breath sounds normal. She has no wheezes.  Abdominal: Soft. Bowel sounds are normal. There is no tenderness.  Musculoskeletal: Normal range of motion. She exhibits no edema, tenderness or deformity.  Neurological: She is alert and  oriented to person, place, and time.  Skin: Skin is warm and dry. She is not diaphoretic.  Psychiatric: She has a normal mood and affect.    BP 118/78 (BP Location: Left Arm, Patient Position: Sitting, Cuff Size: Normal)   Pulse 79   Temp 99 F (37.2 C) (Oral)   Wt 232 lb (105.2 kg)   LMP 01/14/2016 (Approximate)   SpO2 97%   BMI 35.28 kg/m  Wt Readings from Last 3 Encounters:  02/20/16 232 lb (105.2 kg)  01/21/16 240 lb (108.9 kg)  01/07/16 238 lb (108 kg)     Lab Results  Component Value Date   WBC 9.9 02/20/2016   HGB 14.0 02/20/2016   HCT 40.6 02/20/2016   PLT 409.0 (H) 02/20/2016   GLUCOSE 61 (L) 02/20/2016   CHOL 205 (H) 01/22/2016   TRIG 218.0 (H) 01/22/2016   HDL 44.30 01/22/2016   LDLDIRECT 138.0 01/22/2016   ALT 62 (H) 02/20/2016   AST 41 (H) 02/20/2016   NA 140 02/20/2016   K 4.2 02/20/2016   CL 104 02/20/2016   CREATININE 0.87 02/20/2016   BUN 15 02/20/2016   CO2 29 02/20/2016   TSH 1.89 01/22/2016    Lab Results  Component Value Date   TSH 1.89 01/22/2016   Lab Results  Component Value Date   WBC 9.9 02/20/2016   HGB 14.0 02/20/2016   HCT 40.6 02/20/2016   MCV 91.3 02/20/2016   PLT 409.0 (H) 02/20/2016   Lab Results  Component Value Date   NA 140 02/20/2016   K 4.2 02/20/2016   CO2 29 02/20/2016   GLUCOSE 61 (L) 02/20/2016   BUN 15 02/20/2016   CREATININE 0.87 02/20/2016   BILITOT 0.3 02/20/2016   ALKPHOS 76  02/20/2016   AST 41 (H) 02/20/2016   ALT 62 (H) 02/20/2016   PROT 7.1 02/20/2016   ALBUMIN 4.3 02/20/2016   CALCIUM 9.6 02/20/2016   ANIONGAP 11 03/03/2015   GFR 87.27 02/20/2016   Lab Results  Component Value Date   CHOL 205 (H) 01/22/2016   Lab Results  Component Value Date   HDL 44.30 01/22/2016   No results found for: [facility] Lab Results  Component Value Date   TRIG 218.0 (H) 01/22/2016   Lab Results  Component Value Date   CHOLHDL 5 01/22/2016   No results found for: HGBA1C     Assessment & Plan:   Problem List Items Addressed This Visit    GERD (gastroesophageal reflux disease)   Relevant Medications   omeprazole (PRILOSEC) 40 MG capsule   Other Relevant Orders   CBC (Completed)   Magnesium (Completed)   Obesity    Encouraged DASH diet, decrease po intake and increase exercise as tolerated. Needs 7-8 hours of sleep nightly. Avoid trans fats, eat small, frequent meals every 4-5 hours with lean proteins, complex carbs and healthy fats. Minimize simple carbs      H/O cold sores   Essential hypertension    Well controlled, no changes to meds. Encouraged heart healthy diet such as the DASH diet and exercise as tolerated.       Abnormal liver function tests    Continues to run high but stable likely related to alcohol use. Encouraged to discontinue use.       Alcohol use    Roughly 3 days ago she drank an excessive amount of alcohol and had a fall. Today she is complaining of anxiety and a sense of pins and needles in her legs  and hands. Likely alcohol withdrawal she is to report if worsens. No incontinence      Low back pain    Encouraged moist heat and gentle stretching as tolerated. May try NSAIDs and prescription meds as directed and report if symptoms worsen or seek immediate care       Other Visit Diagnoses    Paresthesia    -  Primary   Relevant Orders   Lead, blood (adult age 33 yrs or greater) (Completed)   CBC (Completed)   Sedimentation rate  (Completed)   Comprehensive metabolic panel (Completed)   Magnesium (Completed)   Exposure to lead       Relevant Orders   Lead, blood (adult age 54 yrs or greater) (Completed)      I am having Ms. Kessner start on omeprazole. I am also having her maintain her ondansetron, Levonorgestrel-Ethinyl Estradiol, albuterol, zolpidem, ibuprofen, montelukast, ranitidine, valACYclovir, venlafaxine XR, mometasone-formoterol, hydrochlorothiazide, venlafaxine XR, ALPRAZolam, metoprolol succinate, KRILL OIL PO, and POTASSIUM CHLORIDE PO.  Meds ordered this encounter  Medications  . KRILL OIL PO    Sig: Take by mouth.  Marland Kitchen POTASSIUM CHLORIDE PO    Sig: Take by mouth.  Marland Kitchen omeprazole (PRILOSEC) 40 MG capsule    Sig: Take 1 capsule (40 mg total) by mouth daily.    Dispense:  30 capsule    Refill:  3    CMA served as scribe during this visit. History, Physical and Plan performed by medical provider. Documentation and orders reviewed and attested to.  Penni Homans, MD

## 2016-02-21 LAB — COMPREHENSIVE METABOLIC PANEL
ALT: 62 U/L — ABNORMAL HIGH (ref 0–35)
AST: 41 U/L — ABNORMAL HIGH (ref 0–37)
Albumin: 4.3 g/dL (ref 3.5–5.2)
Alkaline Phosphatase: 76 U/L (ref 39–117)
BUN: 15 mg/dL (ref 6–23)
CO2: 29 mEq/L (ref 19–32)
Calcium: 9.6 mg/dL (ref 8.4–10.5)
Chloride: 104 mEq/L (ref 96–112)
Creatinine, Ser: 0.87 mg/dL (ref 0.40–1.20)
GFR: 87.27 mL/min (ref >60.00)
Glucose, Bld: 61 mg/dL — ABNORMAL LOW (ref 70–99)
Potassium: 4.2 mEq/L (ref 3.5–5.1)
Sodium: 140 mEq/L (ref 135–145)
Total Bilirubin: 0.3 mg/dL (ref 0.2–1.2)
Total Protein: 7.1 g/dL (ref 6.0–8.3)

## 2016-02-21 LAB — SEDIMENTATION RATE: Sed Rate: 12 mm/hr (ref 0–20)

## 2016-02-21 LAB — CBC
HCT: 40.6 % (ref 36.0–46.0)
Hemoglobin: 14 g/dL (ref 12.0–15.0)
MCHC: 34.5 g/dL (ref 30.0–36.0)
MCV: 91.3 fl (ref 78.0–100.0)
Platelets: 409 10*3/uL — ABNORMAL HIGH (ref 150.0–400.0)
RBC: 4.45 Mil/uL (ref 3.87–5.11)
RDW: 13.6 % (ref 11.5–15.5)
WBC: 9.9 10*3/uL (ref 4.0–10.5)

## 2016-02-21 LAB — MAGNESIUM: Magnesium: 2.1 mg/dL (ref 1.5–2.5)

## 2016-02-23 ENCOUNTER — Encounter: Payer: Self-pay | Admitting: Family Medicine

## 2016-02-23 DIAGNOSIS — Z7289 Other problems related to lifestyle: Secondary | ICD-10-CM

## 2016-02-23 DIAGNOSIS — Z789 Other specified health status: Secondary | ICD-10-CM

## 2016-02-23 DIAGNOSIS — M545 Low back pain, unspecified: Secondary | ICD-10-CM | POA: Insufficient documentation

## 2016-02-23 DIAGNOSIS — F1090 Alcohol use, unspecified, uncomplicated: Secondary | ICD-10-CM

## 2016-02-23 HISTORY — DX: Other specified health status: Z78.9

## 2016-02-23 HISTORY — DX: Alcohol use, unspecified, uncomplicated: F10.90

## 2016-02-23 HISTORY — DX: Other problems related to lifestyle: Z72.89

## 2016-02-23 LAB — LEAD, BLOOD (ADULT >= 16 YRS): Lead-Whole Blood: <1 ug/dL (ref <5)

## 2016-02-23 NOTE — Assessment & Plan Note (Signed)
Well controlled, no changes to meds. Encouraged heart healthy diet such as the DASH diet and exercise as tolerated.  °

## 2016-02-23 NOTE — Assessment & Plan Note (Signed)
Encouraged DASH diet, decrease po intake and increase exercise as tolerated. Needs 7-8 hours of sleep nightly. Avoid trans fats, eat small, frequent meals every 4-5 hours with lean proteins, complex carbs and healthy fats. Minimize simple carbs 

## 2016-02-23 NOTE — Assessment & Plan Note (Signed)
Encouraged moist heat and gentle stretching as tolerated. May try NSAIDs and prescription meds as directed and report if symptoms worsen or seek immediate care 

## 2016-02-23 NOTE — Assessment & Plan Note (Signed)
Roughly 3 days ago she drank an excessive amount of alcohol and had a fall. Today she is complaining of anxiety and a sense of pins and needles in her legs and hands. Likely alcohol withdrawal she is to report if worsens. No incontinence

## 2016-02-23 NOTE — Assessment & Plan Note (Signed)
Continues to run high but stable likely related to alcohol use. Encouraged to discontinue use.

## 2016-03-05 ENCOUNTER — Ambulatory Visit: Payer: BLUE CROSS/BLUE SHIELD | Admitting: Family Medicine

## 2016-03-30 ENCOUNTER — Other Ambulatory Visit: Payer: Self-pay | Admitting: Family Medicine

## 2016-03-30 DIAGNOSIS — J029 Acute pharyngitis, unspecified: Secondary | ICD-10-CM

## 2016-04-02 ENCOUNTER — Ambulatory Visit: Payer: BLUE CROSS/BLUE SHIELD | Admitting: Licensed Clinical Social Worker

## 2016-04-06 ENCOUNTER — Ambulatory Visit (INDEPENDENT_AMBULATORY_CARE_PROVIDER_SITE_OTHER): Payer: BLUE CROSS/BLUE SHIELD | Admitting: Psychology

## 2016-04-06 DIAGNOSIS — F4322 Adjustment disorder with anxiety: Secondary | ICD-10-CM | POA: Diagnosis not present

## 2016-04-07 ENCOUNTER — Telehealth: Payer: Self-pay | Admitting: Family Medicine

## 2016-04-07 NOTE — Telephone Encounter (Signed)
I already have 26 patients on a 12 hour day. Sorry but if they feel she is having trouble with the Venlafaxine we can call in 14 of the Venlafaxine Xr 75 mg tabs to start titrating her down so we can switch meds when she comes in on Thursday. Seek care a [facility] if a crisis occurs.

## 2016-04-07 NOTE — Telephone Encounter (Addendum)
Caller name:Lanctot,Michelle Relation to pt: girlfriend Call back number: [phone]    Reason for call:  Girlfriend would like to speak with nurse regarding venlafaxine XR (EFFEXOR-XR) 150 MG 24 hr capsule due to patient experiencing anxiety and fatigue, patient scheduled appointment with PCP 04/09/16 but would like to be fit in today, please advise

## 2016-04-07 NOTE — Telephone Encounter (Signed)
Called left message to call back 

## 2016-04-07 NOTE — Telephone Encounter (Signed)
Informed the mom of PCP instructions/response.  She will give this information to the patient

## 2016-04-07 NOTE — Telephone Encounter (Signed)
Advise if can see the patient today, is scheduled with PCP on 04/09/16

## 2016-04-09 ENCOUNTER — Encounter: Payer: Self-pay | Admitting: Family Medicine

## 2016-04-09 ENCOUNTER — Ambulatory Visit (INDEPENDENT_AMBULATORY_CARE_PROVIDER_SITE_OTHER): Payer: BLUE CROSS/BLUE SHIELD | Admitting: Family Medicine

## 2016-04-09 DIAGNOSIS — Z7289 Other problems related to lifestyle: Secondary | ICD-10-CM

## 2016-04-09 DIAGNOSIS — F3160 Bipolar disorder, current episode mixed, unspecified: Secondary | ICD-10-CM

## 2016-04-09 DIAGNOSIS — D473 Essential (hemorrhagic) thrombocythemia: Secondary | ICD-10-CM | POA: Diagnosis not present

## 2016-04-09 DIAGNOSIS — R51 Headache: Secondary | ICD-10-CM | POA: Diagnosis not present

## 2016-04-09 DIAGNOSIS — I10 Essential (primary) hypertension: Secondary | ICD-10-CM

## 2016-04-09 DIAGNOSIS — R519 Headache, unspecified: Secondary | ICD-10-CM

## 2016-04-09 DIAGNOSIS — Z789 Other specified health status: Secondary | ICD-10-CM | POA: Diagnosis not present

## 2016-04-09 DIAGNOSIS — D75839 Thrombocytosis, unspecified: Secondary | ICD-10-CM

## 2016-04-09 HISTORY — DX: Thrombocytosis, unspecified: D75.839

## 2016-04-09 HISTORY — DX: Headache, unspecified: R51.9

## 2016-04-09 LAB — COMPREHENSIVE METABOLIC PANEL
ALT: 75 U/L — ABNORMAL HIGH (ref 0–35)
AST: 52 U/L — ABNORMAL HIGH (ref 0–37)
Albumin: 4.6 g/dL (ref 3.5–5.2)
Alkaline Phosphatase: 59 U/L (ref 39–117)
BUN: 12 mg/dL (ref 6–23)
CO2: 30 mEq/L (ref 19–32)
Calcium: 9.7 mg/dL (ref 8.4–10.5)
Chloride: 105 mEq/L (ref 96–112)
Creatinine, Ser: 0.87 mg/dL (ref 0.40–1.20)
GFR: 87.16 mL/min (ref >60.00)
Glucose, Bld: 95 mg/dL (ref 70–99)
Potassium: 3.8 mEq/L (ref 3.5–5.1)
Sodium: 140 mEq/L (ref 135–145)
Total Bilirubin: 0.4 mg/dL (ref 0.2–1.2)
Total Protein: 7.1 g/dL (ref 6.0–8.3)

## 2016-04-09 LAB — CBC
HCT: 41.4 % (ref 36.0–46.0)
Hemoglobin: 14.1 g/dL (ref 12.0–15.0)
MCHC: 34.2 g/dL (ref 30.0–36.0)
MCV: 93 fl (ref 78.0–100.0)
Platelets: 378 10*3/uL (ref 150.0–400.0)
RBC: 4.45 Mil/uL (ref 3.87–5.11)
RDW: 13.3 % (ref 11.5–15.5)
WBC: 10.2 10*3/uL (ref 4.0–10.5)

## 2016-04-09 MED ORDER — DULOXETINE HCL 60 MG PO CPEP: 60.0000 mg | ORAL_CAPSULE | Freq: Every day | ORAL | 1 refills | Status: DC

## 2016-04-09 MED ORDER — ARIPIPRAZOLE 2 MG PO TABS: 2.0000 mg | ORAL_TABLET | Freq: Every day | ORAL | 1 refills | Status: DC

## 2016-04-09 NOTE — Assessment & Plan Note (Signed)
Headache for the past week secondary to stress. Encouraged increased hydration, 64 ounces of clear fluids daily. Minimize alcohol and caffeine. Eat small frequent meals with lean proteins and complex carbs. Avoid high and low blood sugars. Get adequate sleep, 7-8 hours a night. Needs exercise daily preferably in the morning.

## 2016-04-09 NOTE — Patient Instructions (Addendum)
Stop the Effexor 75mg . Take the Effexor 150mg  x 3 more days then stop. Start abilify tonight. Start Cymbalta 60mg  this weekend day after stopping the Effexor. If the addition of Abilify is helpful you may stay on Effexor 150 but then do not start the Cymbalta.  Bipolar 1 Disorder Bipolar 1 disorder is a mental health disorder in which a person has episodes of emotional highs (mania), and may also have episodes of emotional lows (depression) in addition to highs. Bipolar 1 disorder is different from other bipolar disorders because it involves extreme manic episodes. These episodes last at least one week or involve symptoms that are so severe that hospitalization is needed to keep the person safe. What increases the risk? The cause of this condition is not known. However, certain factors make you more likely to have bipolar disorder, such as:  Having a family member with the disorder.  An imbalance of certain chemicals in the brain (neurotransmitters).  Stress, such as illness, financial problems, or a death.  Certain conditions that affect the brain or spinal cord (neurologic conditions).  Brain injury (trauma).  Having another mental health disorder, such as:  Obsessive compulsive disorder.  Schizophrenia. What are the signs or symptoms? Symptoms of mania include:  Very high self-esteem or self-confidence.  Decreased need for sleep.  Unusual talkativeness or feeling a need to keep talking. Speech may be very fast. It may seem like you cannot stop talking.  Racing thoughts or constant talking, with quick shifts between topics that may or may not be related (flight of ideas).  Decreased ability to focus or concentrate.  Increased purposeful activity, such as work, studies, or social activity.  Increased nonproductive activity. This could be pacing, squirming and fidgeting, or finger and toe tapping.  Impulsive behavior and poor judgment. This may result in high-risk activities,  such as having unprotected sex or spending a lot of money. Symptoms of depression include:  Feeling sad, hopeless, or helpless.  Frequent or uncontrollable crying.  Lack of feeling or caring about anything.  Sleeping too much.  Moving more slowly than usual.  Not being able to enjoy things you used to enjoy.  Wanting to be alone all the time.  Feeling guilty or worthless.  Lack of energy or motivation.  Trouble concentrating or remembering.  Trouble making decisions.  Increased appetite.  Thoughts of death, or the desire to harm yourself. Sometimes, you may have a mixed mood. This means having symptoms of depression and mania. Stress can make symptoms worse. How is this diagnosed? To diagnose bipolar disorder, your health care provider may ask about your:  Emotional episodes.  Medical history.  Alcohol and drug use. This includes prescription medicines. Certain medical conditions and substances can cause symptoms that seem like bipolar disorder (secondary bipolar disorder). How is this treated? Bipolar disorder is a long-term (chronic) illness. It is best controlled with ongoing (continuous) treatment rather than treatment only when symptoms occur. Treatment may include:  Medicine. Medicine can be prescribed by a provider who specializes in treating mental disorders (psychiatrist).  Medicines called mood stabilizers are usually prescribed.  If symptoms occur even while taking a mood stabilizer, other medicines may be added.  Psychotherapy. Some forms of talk therapy, such as cognitive-behavioral therapy (CBT), can provide support, education, and guidance.  Coping methods, such as journaling or relaxation exercises. These may include:  Yoga.  Meditation.  Deep breathing.  Lifestyle changes, such as:  Limiting alcohol and drug use.  Exercising regularly.  Getting plenty  of sleep.  Making healthy eating choices. A combination of medicine, talk therapy,  and coping methods is best. A procedure in which electricity is applied to the brain through the scalp (electroconvulsive therapy) may be used in cases of severe mania when medicine and psychotherapy work too slowly or do not work. Follow these instructions at home: Activity  Return to your normal activities as told by your health care provider.  Find activities that you enjoy, and make time to do them.  Exercise regularly as told by your health care provider. Lifestyle  Limit alcohol intake to no more than 1 drink a day for nonpregnant women and 2 drinks a day for men. One drink equals 12 oz of beer, 5 oz of wine, or 1 oz of hard liquor.  Follow a set schedule for eating and sleeping.  Eat a balanced diet that includes fresh fruits and vegetables, whole grains, low-fat dairy, and lean meat.  Get 7-8 hours of sleep each night. General instructions  Take over-the-counter and prescription medicines only as told by your health care provider.  Think about joining a support group. Your health care provider may be able to recommend a support group.  Talk with your family and loved ones about your treatment goals and how they can help.  Keep all follow-up visits as told by your health care provider. This is important. Where to find more information: For more information about bipolar disorder, visit the following websites:  [employer] on Mental Illness: www.nami.[place]: https://carter.com/ Contact a health care provider if:  Your symptoms get worse.  You have side effects from your medicine, and they get worse.  You have trouble sleeping.  You have trouble doing daily activities.  You feel unsafe in your surroundings.  You are dealing with substance abuse. Get help right away if:  You have new symptoms.  You have thoughts about harming yourself.  You self-harm. This information is not intended to replace advice given to you by  your health care provider. Make sure you discuss any questions you have with your health care provider. Document Released: 05/11/2000 Document Revised: 09/29/2015 Document Reviewed: 10/03/2015 Elsevier Interactive Patient Education  2017 [employer].

## 2016-04-09 NOTE — Assessment & Plan Note (Addendum)
Recent increased stress once more.  Will rx with Duloxeting and add Abilify, reassess at next visit.

## 2016-04-09 NOTE — Assessment & Plan Note (Addendum)
Has only had 2 beers since [place] encouraged to continue

## 2016-04-09 NOTE — Assessment & Plan Note (Signed)
Well controlled, no changes to meds. Encouraged heart healthy diet such as the DASH diet and exercise as tolerated.  °

## 2016-04-09 NOTE — Assessment & Plan Note (Signed)
Avoid offending foods, start probiotics. Do not eat large meals in late evening and consider raising head of bed.  

## 2016-04-09 NOTE — Progress Notes (Signed)
Patient ID: Kathryn [name], female   DOB: 16-Jan-1996, 20 y.o.   MRN: [id number]   Subjective:    Patient ID: Kathryn [name], female    DOB: May 17, 1995, 21 y.o.   MRN: [id number]  Chief Complaint  Patient presents with  . Anxiety    Anxiety  Presents for follow-up visit. Symptoms include nervous/anxious behavior. Patient reports no chest pain, palpitations or shortness of breath. Primary symptoms comment: No energy. Feeling "blah".    Headache   This is a new problem. The current episode started in the past 7 days. The problem occurs intermittently. The problem has been waxing and waning. The quality of the pain is described as dull. Pertinent negatives include no back pain, blurred vision, coughing, fever or vomiting. The symptoms are aggravated by emotional stress. She has tried acetaminophen for the symptoms.    Patient is in today for a follow up on anxiety. Patient states that she has been having what she feels is stress headaches for the past week. Patient also has a Hx of GERD, HTN, insomnia, bipolar disorder. No additional acute concerns noted at this time. Headaches do not have associated photophobia or phonophobia. No new stressors noted. Denies CP/palp/SOB/HA/congestion/fevers/GI or GU c/o. Taking meds as prescribed  I acted as a Education administrator for Penni Homans, [place], [place]     Past Medical History:  Diagnosis Date  . Abnormal liver function tests 02/02/2016  . Alcohol use 02/23/2016  . Allergy   . Anxiety   . Bipolar disorder ([place]) 05-06-2014   PTSD s/p death of best friend   . Depression   . Depression with anxiety 2014/05/06  . Essential hypertension 02/02/2016  . GERD (gastroesophageal reflux disease)   . 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 Hyperlipidemia, mixed 02/02/2016  . Migraine   . Obesity 04/15/2015  . Ovarian cyst   . PCOS (polycystic ovarian syndrome)   . Preventative health care 10/27/2015    Past Surgical History:  Procedure Laterality Date  . ADENOIDECTOMY    .  Adnoids    . TYMPANOSTOMY TUBE PLACEMENT    . TYMPANOSTOMY TUBE PLACEMENT      Family History  Problem Relation Age of Onset  . Hypertension Mother   . Heart disease Mother     CAD, stent at 51, s/p cardiac arrest with Defib  . Hyperlipidemia Mother   . Diabetes Mother     s/p gastric bypass  . Obesity Mother     s/p gastric bypass  . Hyperlipidemia Father   . Hypertension Father   . Asthma Sister   . Hyperlipidemia Brother   . Stroke Maternal Grandmother   . Thyroid disease Maternal Grandmother   . Heart disease Maternal Grandfather     MI first at 36, s/p stents and CABG  . Hyperlipidemia Maternal Grandfather   . Hypertension Maternal Grandfather   . Stroke Maternal Grandfather   . Cancer Maternal Grandfather     melanoma  . Aneurysm Maternal Grandfather     brain  . Kidney disease Maternal Grandfather     tumor  . Heart disease Paternal Grandmother   . Cancer Paternal Lucilla Edin and brain  . Heart disease Maternal Uncle   . Vision loss Maternal Uncle     Social History   Social History  . Marital status: Single    Spouse name: N/A  . Number of children: N/A  . Years of education: N/A   Occupational History  . Not on file.  Social History Main Topics  . Smoking status: Current Some Day Smoker    Packs/day: 1.00    Types: Cigarettes  . Smokeless tobacco: Current User    Types: Snuff  . Alcohol use 0.0 oz/week     Comment: every 6 months  . Drug use: No  . Sexual activity: Yes    Birth control/ protection: Pill     Comment: lives with partner, works for [employer], no diewtary restrictions.    Other Topics Concern  . Not on file   Social History Narrative  . No narrative on file    Outpatient Medications Prior to Visit  Medication Sig Dispense Refill  . albuterol (PROVENTIL HFA;VENTOLIN HFA) 108 (90 Base) MCG/ACT inhaler Inhale 2 puffs into the lungs every 6 (six) hours as needed for wheezing or shortness of breath. 1 Inhaler 0    . ALPRAZolam (XANAX) 0.25 MG tablet Take 1 tablet (0.25 mg total) by mouth 2 (two) times daily as needed for anxiety. 20 tablet 1  . hydrochlorothiazide (HYDRODIURIL) 25 MG tablet Take 1 tablet (25 mg total) by mouth daily. 30 tablet 3  . ibuprofen (ADVIL,MOTRIN) 800 MG tablet Take 1 tablet (800 mg total) by mouth every 8 (eight) hours as needed. 30 tablet 1  . KRILL OIL PO Take by mouth.    . Levonorgestrel-Ethinyl Estradiol (AMETHIA,CAMRESE) 0.15-0.03 &0.01 MG tablet Take 1 tablet by mouth daily. 1 Package 4  . metoprolol succinate (TOPROL-XL) 50 MG 24 hr tablet Take 1 tablet (50 mg total) by mouth daily. Take with or immediately following a meal. 30 tablet 3  . mometasone-formoterol (DULERA) 100-5 MCG/ACT AERO Inhale 2 puffs into the lungs 2 (two) times daily. 1 Inhaler 1  . montelukast (SINGULAIR) 10 MG tablet Take 1 tablet (10 mg total) by mouth at bedtime. 30 tablet 6  . omeprazole (PRILOSEC) 40 MG capsule Take 1 capsule (40 mg total) by mouth daily. 30 capsule 3  . ondansetron (ZOFRAN) 4 MG tablet Take 1 tablet (4 mg total) by mouth every 6 (six) hours. 12 tablet 0  . POTASSIUM CHLORIDE PO Take by mouth.    . ranitidine (ZANTAC) 150 MG tablet Take 1 tablet (150 mg total) by mouth 2 (two) times daily. 180 tablet 1  . valACYclovir (VALTREX) 1000 MG tablet 2 tab by mouth in AM and 2 tabs by mouth 12 hrs later at start of cold sore 20 tablet 0  . venlafaxine XR (EFFEXOR XR) 75 MG 24 hr capsule Take 1 capsule (75 mg total) by mouth daily with breakfast. 30 capsule 5  . venlafaxine XR (EFFEXOR-XR) 150 MG 24 hr capsule TAKE ONE CAPSULE BY MOUTH DAILY WITH BREAKFAST WITH 75 MG CAP 30 capsule 1  . zolpidem (AMBIEN) 5 MG tablet Take 1 tablet (5 mg total) by mouth at bedtime as needed for sleep. 30 tablet 2   No facility-administered medications prior to visit.     Allergies  Allergen Reactions  . Oxycodone Nausea Only    Oxycontin also  . Trazodone And Nefazodone     Headache , "feels hung  over"    Review of Systems  Constitutional: Positive for malaise/fatigue. Negative for fever.  HENT: Negative for congestion.   Eyes: Negative for blurred vision.  Respiratory: Negative for cough and shortness of breath.   Cardiovascular: Negative for chest pain, palpitations and leg swelling.  Gastrointestinal: Negative for vomiting.  Musculoskeletal: Negative for back pain.  Skin: Negative for rash.  Neurological: Positive for headaches. Negative for  loss of consciousness.  Psychiatric/Behavioral: Positive for depression. The patient is nervous/anxious.        Objective:    Physical Exam  Constitutional: She is oriented to person, place, and time. She appears well-developed and well-nourished. No distress.  HENT:  Head: Normocephalic and atraumatic.  Eyes: Conjunctivae are normal.  Neck: Normal range of motion. No thyromegaly present.  Cardiovascular: Normal rate and regular rhythm.   Pulmonary/Chest: Effort normal and breath sounds normal. She has no wheezes.  Abdominal: Soft. Bowel sounds are normal. There is no tenderness.  Musculoskeletal: She exhibits no edema or deformity.  Neurological: She is alert and oriented to person, place, and time.  Skin: Skin is warm and dry. She is not diaphoretic.  Psychiatric: She has a normal mood and affect.    There were no vitals taken for this visit. Wt Readings from Last 3 Encounters:  02/20/16 232 lb (105.2 kg)  01/21/16 240 lb (108.9 kg)  01/07/16 238 lb (108 kg)     Lab Results  Component Value Date   WBC 9.9 02/20/2016   HGB 14.0 02/20/2016   HCT 40.6 02/20/2016   PLT 409.0 (H) 02/20/2016   GLUCOSE 61 (L) 02/20/2016   CHOL 205 (H) 01/22/2016   TRIG 218.0 (H) 01/22/2016   HDL 44.30 01/22/2016   LDLDIRECT 138.0 01/22/2016   ALT 62 (H) 02/20/2016   AST 41 (H) 02/20/2016   NA 140 02/20/2016   K 4.2 02/20/2016   CL 104 02/20/2016   CREATININE 0.87 02/20/2016   BUN 15 02/20/2016   CO2 29 02/20/2016   TSH 1.89  01/22/2016    Lab Results  Component Value Date   TSH 1.89 01/22/2016   Lab Results  Component Value Date   WBC 9.9 02/20/2016   HGB 14.0 02/20/2016   HCT 40.6 02/20/2016   MCV 91.3 02/20/2016   PLT 409.0 (H) 02/20/2016   Lab Results  Component Value Date   NA 140 02/20/2016   K 4.2 02/20/2016   CO2 29 02/20/2016   GLUCOSE 61 (L) 02/20/2016   BUN 15 02/20/2016   CREATININE 0.87 02/20/2016   BILITOT 0.3 02/20/2016   ALKPHOS 76 02/20/2016   AST 41 (H) 02/20/2016   ALT 62 (H) 02/20/2016   PROT 7.1 02/20/2016   ALBUMIN 4.3 02/20/2016   CALCIUM 9.6 02/20/2016   ANIONGAP 11 03/03/2015   GFR 87.27 02/20/2016   Lab Results  Component Value Date   CHOL 205 (H) 01/22/2016   Lab Results  Component Value Date   HDL 44.30 01/22/2016   No results found for: [facility] Lab Results  Component Value Date   TRIG 218.0 (H) 01/22/2016   Lab Results  Component Value Date   CHOLHDL 5 01/22/2016   No results found for: HGBA1C     Assessment & Plan:   Problem List Items Addressed This Visit    None      I am having Ms. Borrero maintain her ondansetron, Levonorgestrel-Ethinyl Estradiol, albuterol, zolpidem, ibuprofen, montelukast, ranitidine, valACYclovir, mometasone-formoterol, hydrochlorothiazide, venlafaxine XR, ALPRAZolam, metoprolol succinate, KRILL OIL PO, POTASSIUM CHLORIDE PO, omeprazole, and venlafaxine XR.  No orders of the defined types were placed in this encounter.   CMA served as Education administrator during this visit. History, Physical and Plan performed by medical provider. Documentation and orders reviewed and attested to.  Shan Levans, CMA

## 2016-04-09 NOTE — Progress Notes (Signed)
Pre visit review using our clinic review tool, if applicable. No additional management support is needed unless otherwise documented below in the visit note. 

## 2016-04-10 ENCOUNTER — Ambulatory Visit (INDEPENDENT_AMBULATORY_CARE_PROVIDER_SITE_OTHER): Payer: BLUE CROSS/BLUE SHIELD | Admitting: Psychology

## 2016-04-10 DIAGNOSIS — F902 Attention-deficit hyperactivity disorder, combined type: Secondary | ICD-10-CM | POA: Diagnosis not present

## 2016-04-10 DIAGNOSIS — F331 Major depressive disorder, recurrent, moderate: Secondary | ICD-10-CM | POA: Diagnosis not present

## 2016-04-27 ENCOUNTER — Ambulatory Visit (INDEPENDENT_AMBULATORY_CARE_PROVIDER_SITE_OTHER): Payer: BLUE CROSS/BLUE SHIELD | Admitting: Psychology

## 2016-04-27 ENCOUNTER — Telehealth: Payer: Self-pay | Admitting: Family Medicine

## 2016-04-27 DIAGNOSIS — F4010 Social phobia, unspecified: Secondary | ICD-10-CM

## 2016-04-27 DIAGNOSIS — F331 Major depressive disorder, recurrent, moderate: Secondary | ICD-10-CM

## 2016-04-27 DIAGNOSIS — F902 Attention-deficit hyperactivity disorder, combined type: Secondary | ICD-10-CM

## 2016-04-27 NOTE — Telephone Encounter (Signed)
Caller name: Relationship to patient: Self Can be reached: [phone] Pharmacy:  Reason for call: Patient thinks she has worms and needs to know what she needs to do

## 2016-04-28 ENCOUNTER — Telehealth: Payer: Self-pay | Admitting: Family Medicine

## 2016-04-28 ENCOUNTER — Other Ambulatory Visit: Payer: Self-pay | Admitting: Family Medicine

## 2016-04-28 ENCOUNTER — Other Ambulatory Visit: Payer: BLUE CROSS/BLUE SHIELD

## 2016-04-28 DIAGNOSIS — B839 Helminthiasis, unspecified: Secondary | ICD-10-CM

## 2016-04-28 MED ORDER — MEBENDAZOLE 100 MG PO CHEW: 100.0000 mg | CHEWABLE_TABLET | Freq: Two times a day (BID) | ORAL | 0 refills | Status: DC

## 2016-04-28 NOTE — Telephone Encounter (Signed)
Put order in for stool test/sent in prescription. Called the patient to inform of her instructions----had to leave a message.

## 2016-04-28 NOTE — Telephone Encounter (Signed)
Pt dropped off documents for provider to see and have on pt's chart. Document put at front office tray. [facility] Documents)

## 2016-04-28 NOTE — Telephone Encounter (Signed)
Have her come in and pick up cups for ova and parasite and wbc in stool tests. Then we can treat with Mebendazole 100 mg po bid x 3 days, do not have to wait for results

## 2016-04-28 NOTE — Telephone Encounter (Signed)
Patient called back informed to pickup stool cups in the lab and that rx was sent to pharmacy

## 2016-04-29 LAB — FECAL LACTOFERRIN, QUANT: Lactoferrin: NEGATIVE

## 2016-04-30 ENCOUNTER — Ambulatory Visit: Payer: Self-pay | Admitting: Family Medicine

## 2016-05-01 LAB — OVA AND PARASITE EXAMINATION: OP: NONE SEEN

## 2016-05-08 ENCOUNTER — Other Ambulatory Visit: Payer: Self-pay | Admitting: Family

## 2016-05-14 NOTE — Telephone Encounter (Signed)
Yes I will consider prescribing meds I have reviewed her results but because of her high anxiety and other concerns. I need to see her to discuss the risks and benefits of treatments more fully and check vitals then we can start.

## 2016-05-14 NOTE — Telephone Encounter (Signed)
Patient called to ask if provider is going to Rx medication for her based on the documents that were dropped off from [place] evaluation. Plse adv

## 2016-05-18 NOTE — Telephone Encounter (Signed)
Pt called to f/u and advised her of Dr. Frederik Pear note

## 2016-06-05 ENCOUNTER — Other Ambulatory Visit: Payer: Self-pay | Admitting: Family Medicine

## 2016-06-09 ENCOUNTER — Encounter: Payer: Self-pay | Admitting: Family Medicine

## 2016-06-09 ENCOUNTER — Ambulatory Visit (INDEPENDENT_AMBULATORY_CARE_PROVIDER_SITE_OTHER): Payer: BLUE CROSS/BLUE SHIELD | Admitting: Family Medicine

## 2016-06-09 VITALS — BP 122/84 | HR 73 | Temp 98.9°F | Wt 226.0 lb

## 2016-06-09 DIAGNOSIS — I10 Essential (primary) hypertension: Secondary | ICD-10-CM

## 2016-06-09 DIAGNOSIS — E6609 Other obesity due to excess calories: Secondary | ICD-10-CM

## 2016-06-09 DIAGNOSIS — F418 Other specified anxiety disorders: Secondary | ICD-10-CM | POA: Diagnosis not present

## 2016-06-09 DIAGNOSIS — F909 Attention-deficit hyperactivity disorder, unspecified type: Secondary | ICD-10-CM

## 2016-06-09 DIAGNOSIS — L02216 Cutaneous abscess of umbilicus: Secondary | ICD-10-CM | POA: Diagnosis not present

## 2016-06-09 MED ORDER — METHYLPHENIDATE HCL 5 MG PO TABS: 5.0000 mg | ORAL_TABLET | Freq: Two times a day (BID) | ORAL | 0 refills | Status: DC

## 2016-06-09 MED ORDER — ARIPIPRAZOLE 2 MG PO TABS: 2.0000 mg | ORAL_TABLET | Freq: Every day | ORAL | 2 refills | Status: DC

## 2016-06-09 NOTE — Progress Notes (Signed)
Pre visit review using our clinic review tool, if applicable. No additional management support is needed unless otherwise documented below in the visit note. 

## 2016-06-09 NOTE — Patient Instructions (Signed)

## 2016-06-09 NOTE — Progress Notes (Signed)
Patient ID: Kathryn [name], female   DOB: [birth year], 21 y.o.   MRN: [id number]   Subjective:  I acted as a Education administrator for Kathryn [name], [place], [place]   Patient ID: Kathryn [name], female    DOB: April 07, 1995, 21 y.o.   MRN: [id number]  Chief Complaint  Patient presents with  . Follow-up    Medication F/U.    HPI  Patient is in today for a follow up visit to discuss medications. Patient states that she feels that her depression medication needs adjusting. Also states that she was recently tested for ADD/ ADHD and needs to discuss what medications to be placed on. Patient also states that she has been seen and treated at an Urgent Care for an abscess on her navel. And was told that she would eventually need to have surgery. Patient has a Hx of bipolar disorder, hyperlipidemia, insomnia, obesity. Patient has no additional acute concerns noted at this time. She has no abdominal pain, fevers, chills today. She continues to struggle with depression and endorses anxiety and anhedonia but denies suicidal ideation.   Patient Care Team: Mosie Lukes, MD as PCP - General (Family Medicine)   Past Medical History:  Diagnosis Date  . Abnormal liver function tests 02/02/2016  . ADHD 06/10/2016  . Alcohol use 02/23/2016  . Allergy   . Anxiety   . Bipolar disorder ([place]) 04/13/2014   PTSD s/p death of best friend   . Depression   . Depression with anxiety 04-13-14  . Depression with anxiety 04-13-14   PTSD s/p death of best friend   . Essential hypertension 02/02/2016  . GERD (gastroesophageal reflux disease)   . 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 Headache 04/09/2016  . Hyperlipidemia, mixed 02/02/2016  . Migraine   . Obesity 04/15/2015  . Ovarian cyst   . PCOS (polycystic ovarian syndrome)   . Preventative health care 10/27/2015  . Thrombocytosis ([place]) 04/09/2016    Past Surgical History:  Procedure Laterality Date  . ADENOIDECTOMY    . Adnoids    . TYMPANOSTOMY TUBE PLACEMENT    . TYMPANOSTOMY TUBE  PLACEMENT      Family History  Problem Relation Age of Onset  . Hypertension Mother   . Heart disease Mother     CAD, stent at 23, s/p cardiac arrest with Defib  . Hyperlipidemia Mother   . Diabetes Mother     s/p gastric bypass  . Obesity Mother     s/p gastric bypass  . Hyperlipidemia Father   . Hypertension Father   . Asthma Sister   . Hyperlipidemia Brother   . Stroke Maternal Grandmother   . Thyroid disease Maternal Grandmother   . Heart disease Maternal Grandfather     MI first at 6, s/p stents and CABG  . Hyperlipidemia Maternal Grandfather   . Hypertension Maternal Grandfather   . Stroke Maternal Grandfather   . Cancer Maternal Grandfather     melanoma  . Aneurysm Maternal Grandfather     brain  . Kidney disease Maternal Grandfather     tumor  . Heart disease Paternal Grandmother   . Cancer Paternal Lucilla Edin and brain  . Heart disease Maternal Uncle   . Vision loss Maternal Uncle     Social History   Social History  . Marital status: Single    Spouse name: N/A  . Number of children: N/A  . Years of education: N/A   Occupational History  . Not on file.  Social History Main Topics  . Smoking status: Current Some Day Smoker    Packs/day: 1.00    Types: Cigarettes  . Smokeless tobacco: Current User    Types: Snuff  . Alcohol use 0.0 oz/week     Comment: every 6 months  . Drug use: No  . Sexual activity: Yes    Birth control/ protection: Pill     Comment: lives with partner, works for [employer], no diewtary restrictions.    Other Topics Concern  . Not on file   Social History Narrative  . No narrative on file    Outpatient Medications Prior to Visit  Medication Sig Dispense Refill  . albuterol (PROVENTIL HFA;VENTOLIN HFA) 108 (90 Base) MCG/ACT inhaler Inhale 2 puffs into the lungs every 6 (six) hours as needed for wheezing or shortness of breath. 1 Inhaler 0  . ALPRAZolam (XANAX) 0.25 MG tablet Take 1 tablet (0.25 mg total)  by mouth 2 (two) times daily as needed for anxiety. 20 tablet 1  . ARIPiprazole (ABILIFY) 2 MG tablet TAKE ONE TABLET BY MOUTH DAILY 30 tablet 0  . hydrochlorothiazide (HYDRODIURIL) 25 MG tablet TAKE ONE TABLET BY MOUTH DAILY 30 tablet 2  . ibuprofen (ADVIL,MOTRIN) 800 MG tablet Take 1 tablet (800 mg total) by mouth every 8 (eight) hours as needed. 30 tablet 1  . KRILL OIL PO Take by mouth.    . Levonorgestrel-Ethinyl Estradiol (AMETHIA,CAMRESE) 0.15-0.03 &0.01 MG tablet Take 1 tablet by mouth daily. 1 Package 4  . metoprolol succinate (TOPROL-XL) 50 MG 24 hr tablet Take 1 tablet (50 mg total) by mouth daily. Take with or immediately following a meal. 30 tablet 3  . mometasone-formoterol (DULERA) 100-5 MCG/ACT AERO Inhale 2 puffs into the lungs 2 (two) times daily. 1 Inhaler 1  . montelukast (SINGULAIR) 10 MG tablet Take 1 tablet (10 mg total) by mouth at bedtime. 30 tablet 6  . omeprazole (PRILOSEC) 40 MG capsule Take 1 capsule (40 mg total) by mouth daily. 30 capsule 3  . ondansetron (ZOFRAN) 4 MG tablet Take 1 tablet (4 mg total) by mouth every 6 (six) hours. 12 tablet 0  . POTASSIUM CHLORIDE PO Take by mouth.    . ranitidine (ZANTAC) 150 MG tablet Take 1 tablet (150 mg total) by mouth 2 (two) times daily. 180 tablet 1  . valACYclovir (VALTREX) 1000 MG tablet 2 tab by mouth in AM and 2 tabs by mouth 12 hrs later at start of cold sore 20 tablet 0  . venlafaxine XR (EFFEXOR-XR) 150 MG 24 hr capsule TAKE ONE CAPSULE BY MOUTH DAILY WITH BREAKFAST WITH 75 MG CAP 30 capsule 0  . zolpidem (AMBIEN) 5 MG tablet Take 1 tablet (5 mg total) by mouth at bedtime as needed for sleep. 30 tablet 2  . DULoxetine (CYMBALTA) 60 MG capsule Take 1 capsule (60 mg total) by mouth daily. (Patient not taking: Reported on 06/09/2016) 30 capsule 1  . mebendazole (VERMOX) 100 MG chewable tablet Chew 1 tablet (100 mg total) by mouth 2 (two) times daily. Take for 3 days (Patient not taking: Reported on 06/09/2016) 6 tablet 0    No facility-administered medications prior to visit.     Allergies  Allergen Reactions  . Oxycodone Nausea Only    Oxycontin also  . Seroquel [Quetiapine Fumarate]     Mental status changes  . Trazodone And Nefazodone     Headache , "feels hung over"    Review of Systems  Constitutional: Negative for fever and  malaise/fatigue.  HENT: Negative for congestion.   Eyes: Negative for blurred vision.  Respiratory: Negative for cough and shortness of breath.   Cardiovascular: Negative for chest pain, palpitations and leg swelling.  Gastrointestinal: Negative for vomiting.  Musculoskeletal: Negative for back pain.  Skin: Negative for rash.  Neurological: Negative for loss of consciousness and headaches.  Psychiatric/Behavioral: Positive for depression. Negative for hallucinations and suicidal ideas. The patient is nervous/anxious. The patient does not have insomnia.        Objective:    Physical Exam  Constitutional: She is oriented to person, place, and time. She appears well-developed and well-nourished. No distress.  HENT:  Head: Normocephalic and atraumatic.  Eyes: Conjunctivae are normal.  Neck: Normal range of motion. No thyromegaly present.  Cardiovascular: Normal rate and regular rhythm.   Pulmonary/Chest: Effort normal and breath sounds normal. She has no wheezes.  Abdominal: Soft. Bowel sounds are normal. There is no tenderness.  Musculoskeletal: She exhibits no edema or deformity.  Neurological: She is alert and oriented to person, place, and time.  Skin: Skin is warm and dry. She is not diaphoretic.  Psychiatric: She has a normal mood and affect.    BP 122/84 (BP Location: Left Arm, Patient Position: Sitting, Cuff Size: Normal)   Pulse 73   Temp 98.9 F (37.2 C) (Oral)   Wt 226 lb (102.5 kg)   SpO2 100% Comment: RA  BMI 34.36 kg/m  Wt Readings from Last 3 Encounters:  06/09/16 226 lb (102.5 kg)  04/09/16 233 lb 6.4 oz (105.9 kg)  02/20/16 232 lb (105.2  kg)   BP Readings from Last 3 Encounters:  06/09/16 122/84  04/09/16 124/80  02/20/16 118/78     Immunization History  Administered Date(s) Administered  . Influenza-Unspecified 12/01/2014, 12/30/2015    Health Maintenance  Topic Date Due  . HIV Screening  01/11/2010  . TETANUS/TDAP  01/11/2014  . CHLAMYDIA SCREENING  12/18/2015  . PAP SMEAR  01/12/2016  . INFLUENZA VACCINE  09/16/2016    Lab Results  Component Value Date   WBC 10.2 04/09/2016   HGB 14.1 04/09/2016   HCT 41.4 04/09/2016   PLT 378.0 04/09/2016   GLUCOSE 95 04/09/2016   CHOL 205 (H) 01/22/2016   TRIG 218.0 (H) 01/22/2016   HDL 44.30 01/22/2016   LDLDIRECT 138.0 01/22/2016   ALT 75 (H) 04/09/2016   AST 52 (H) 04/09/2016   NA 140 04/09/2016   K 3.8 04/09/2016   CL 105 04/09/2016   CREATININE 0.87 04/09/2016   BUN 12 04/09/2016   CO2 30 04/09/2016   TSH 1.89 01/22/2016    Lab Results  Component Value Date   TSH 1.89 01/22/2016   Lab Results  Component Value Date   WBC 10.2 04/09/2016   HGB 14.1 04/09/2016   HCT 41.4 04/09/2016   MCV 93.0 04/09/2016   PLT 378.0 04/09/2016   Lab Results  Component Value Date   NA 140 04/09/2016   K 3.8 04/09/2016   CO2 30 04/09/2016   GLUCOSE 95 04/09/2016   BUN 12 04/09/2016   CREATININE 0.87 04/09/2016   BILITOT 0.4 04/09/2016   ALKPHOS 59 04/09/2016   AST 52 (H) 04/09/2016   ALT 75 (H) 04/09/2016   PROT 7.1 04/09/2016   ALBUMIN 4.6 04/09/2016   CALCIUM 9.7 04/09/2016   ANIONGAP 11 03/03/2015   GFR 87.16 04/09/2016   Lab Results  Component Value Date   CHOL 205 (H) 01/22/2016   Lab Results  Component Value Date  HDL 44.30 01/22/2016   No results found for: [facility] Lab Results  Component Value Date   TRIG 218.0 (H) 01/22/2016   Lab Results  Component Value Date   CHOLHDL 5 01/22/2016   No results found for: HGBA1C       Assessment & Plan:   Problem List Items Addressed This Visit    Depression with anxiety    Struggling but  will start treating her ADHD and then reevaluation.      Obesity    Encouraged DASH diet, decrease po intake and increase exercise as tolerated. Needs 7-8 hours of sleep nightly. Avoid trans fats, eat small, frequent meals every 4-5 hours with lean proteins, complex carbs and healthy fats. Minimize simple carbs      Relevant Medications   methylphenidate (RITALIN) 5 MG tablet   Abscess, umbilical - Primary    Well healed presently but has become recurrent is referred to general surgery.       Relevant Orders   Ambulatory referral to General Surgery   Essential hypertension    Well controlled, no changes to meds. Encouraged heart healthy diet such as the DASH diet and exercise as tolerated.       ADHD    Will start Ritalin 5 mg tabs, 1 tab po bid and reassess in 3 weeks. Warned regarding possible side effects.          I have discontinued Ms. Catoe's DULoxetine and mebendazole. I am also having her start on methylphenidate and ARIPiprazole. Additionally, I am having her maintain her ondansetron, Levonorgestrel-Ethinyl Estradiol, albuterol, zolpidem, ibuprofen, montelukast, ranitidine, valACYclovir, mometasone-formoterol, ALPRAZolam, metoprolol succinate, KRILL OIL PO, POTASSIUM CHLORIDE PO, omeprazole, hydrochlorothiazide, venlafaxine XR, and ARIPiprazole.  Meds ordered this encounter  Medications  . methylphenidate (RITALIN) 5 MG tablet    Sig: Take 1 tablet (5 mg total) by mouth 2 (two) times daily.    Dispense:  60 tablet    Refill:  0  . ARIPiprazole (ABILIFY) 2 MG tablet    Sig: Take 1 tablet (2 mg total) by mouth daily.    Dispense:  30 tablet    Refill:  2    CMA served as scribe during this visit. History, Physical and Plan performed by medical provider. Documentation and orders reviewed and attested to.  Kathryn Homans, MD

## 2016-06-10 ENCOUNTER — Encounter: Payer: Self-pay | Admitting: Family Medicine

## 2016-06-10 DIAGNOSIS — F909 Attention-deficit hyperactivity disorder, unspecified type: Secondary | ICD-10-CM

## 2016-06-10 HISTORY — DX: Attention-deficit hyperactivity disorder, unspecified type: F90.9

## 2016-06-10 NOTE — Assessment & Plan Note (Signed)
Struggling but will start treating her ADHD and then reevaluation.

## 2016-06-10 NOTE — Assessment & Plan Note (Signed)
Encouraged DASH diet, decrease po intake and increase exercise as tolerated. Needs 7-8 hours of sleep nightly. Avoid trans fats, eat small, frequent meals every 4-5 hours with lean proteins, complex carbs and healthy fats. Minimize simple carbs 

## 2016-06-10 NOTE — Assessment & Plan Note (Signed)
Well healed presently but has become recurrent is referred to general surgery.

## 2016-06-10 NOTE — Assessment & Plan Note (Signed)
Will start Ritalin 5 mg tabs, 1 tab po bid and reassess in 3 weeks. Warned regarding possible side effects.

## 2016-06-10 NOTE — Assessment & Plan Note (Signed)
Well controlled, no changes to meds. Encouraged heart healthy diet such as the DASH diet and exercise as tolerated.  °

## 2016-06-11 ENCOUNTER — Telehealth: Payer: Self-pay | Admitting: Family Medicine

## 2016-06-11 NOTE — Telephone Encounter (Signed)
Increase her Venlafaxine XR 225 mg tabs, 1 tab po qhs disp #30 with 2 rf

## 2016-06-11 NOTE — Telephone Encounter (Signed)
Called left message to call back 

## 2016-06-11 NOTE — Telephone Encounter (Signed)
Patient was just seen this week and stated you told her if depression medication was not working to let her know.  She had a bad day today at work was overwhelmed/and had to leave early.  She would like either an increase or change what she currently is on.  She hopes not to have to come in since just seen this week.

## 2016-06-11 NOTE — Telephone Encounter (Signed)
Caller name: Relationship to patient: Self Can be reached: [phone]  Pharmacy:  Reason for call: Patient request call back to discuss changing medication

## 2016-06-11 NOTE — Telephone Encounter (Incomplete)
Have her increase her Venlafaxine to 225 mg tab, 1 tab po daily disp #30 with 2 rf

## 2016-06-12 ENCOUNTER — Encounter (HOSPITAL_COMMUNITY): Payer: Self-pay | Admitting: Emergency Medicine

## 2016-06-12 ENCOUNTER — Telehealth: Payer: Self-pay | Admitting: Family Medicine

## 2016-06-12 ENCOUNTER — Emergency Department (HOSPITAL_COMMUNITY)
Admission: EM | Admit: 2016-06-12 | Discharge: 2016-06-12 | Disposition: A | Discharge status: Home / Self Care | Payer: BLUE CROSS/BLUE SHIELD | Attending: Emergency Medicine | Admitting: Emergency Medicine

## 2016-06-12 DIAGNOSIS — I10 Essential (primary) hypertension: Secondary | ICD-10-CM | POA: Diagnosis not present

## 2016-06-12 DIAGNOSIS — F17210 Nicotine dependence, cigarettes, uncomplicated: Secondary | ICD-10-CM | POA: Diagnosis not present

## 2016-06-12 DIAGNOSIS — R45851 Suicidal ideations: Secondary | ICD-10-CM | POA: Diagnosis present

## 2016-06-12 DIAGNOSIS — F3289 Other specified depressive episodes: Secondary | ICD-10-CM | POA: Insufficient documentation

## 2016-06-12 DIAGNOSIS — Z79899 Other long term (current) drug therapy: Secondary | ICD-10-CM | POA: Insufficient documentation

## 2016-06-12 DIAGNOSIS — F909 Attention-deficit hyperactivity disorder, unspecified type: Secondary | ICD-10-CM | POA: Insufficient documentation

## 2016-06-12 MED ORDER — ALPRAZOLAM 0.25 MG PO TABS: ORAL_TABLET | ORAL | 0 refills | Status: DC

## 2016-06-12 MED ORDER — VENLAFAXINE HCL ER 225 MG PO TB24: 225.0000 mg | ORAL_TABLET | Freq: Every day | ORAL | 1 refills | Status: DC

## 2016-06-12 NOTE — Telephone Encounter (Signed)
Done   pc

## 2016-06-12 NOTE — ED Triage Notes (Signed)
Pt c/o depression and suicidal thoughts, no plan. Pt states that she has a PMD that prescribes meds for depression but feels she needs a medication adjustment. Pt states over the past several weeks / month she has had feelings of injuring herself. Accompanied by wife.

## 2016-06-12 NOTE — Telephone Encounter (Signed)
Caller name: Dr. Antony Haste  Relation to pt: doctor Call back [place]:  Reason for call: Doctor would like to speak with provider ASAP. As pt at the Hospital and wanting to consult with provider the situation.

## 2016-06-12 NOTE — Discharge Instructions (Signed)
Follow-up with your doctor as we discussed. Return here for any problems

## 2016-06-12 NOTE — ED Notes (Signed)
ED Provider at bedside. 

## 2016-06-12 NOTE — Telephone Encounter (Signed)
Spoke with Dr Antony Haste and patient at ER. She had a rough night but denies being actively suicidal. She endorses chronic suicidal ideas without an active plan and feels it became worse with addition of Abilify. Will stop Abilify, increase Venlafaxine to 225 mg tabs, 1 tab po daily. Disp #30 with 1 rf. Can use Alprazolam 0.25 mg 1-3 tab prn severe anxiety daily and she agrees to come to our office at 7:30 on 06/15/16. Please schedule appointment.

## 2016-06-12 NOTE — ED Provider Notes (Signed)
[facility] DEPT Provider Note   CSN: [id number] Arrival date & time: 06/12/16  0700     History   Chief Complaint Chief Complaint  Patient presents with  . Suicidal    HPI Kathryn [name] is a 21 y.o. female.  21 year old female presents with increased depression and intermittent suicidal thoughts without a definitive plan. No prior history of suicide attempt. Patient has seen her primary care doctor this week and was prescribed Ritalin for ADHD which is not helped her symptoms. States that when she was placed on Abilify several months ago this with things became worse. Denies any auditory or visual hallucinations. No alcohol use. Does use occasional marijuana. Denies any intentional ingestions at this time. No other treatments used prior to arrival.      Past Medical History:  Diagnosis Date  . Abnormal liver function tests 02/02/2016  . ADHD 06/10/2016  . Alcohol use 02/23/2016  . Allergy   . Anxiety   . Bipolar disorder ([place]) 06-May-2014   PTSD s/p death of best friend   . Depression   . Depression with anxiety May 06, 2014  . Depression with anxiety 05-06-2014   PTSD s/p death of best friend   . Essential hypertension 02/02/2016  . GERD (gastroesophageal reflux disease)   . 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 Headache 04/09/2016  . Hyperlipidemia, mixed 02/02/2016  . Migraine   . Obesity 04/15/2015  . Ovarian cyst   . PCOS (polycystic ovarian syndrome)   . Preventative health care 10/27/2015  . Thrombocytosis ([place]) 04/09/2016    Patient Active Problem List   Diagnosis Date Noted  . ADHD 06/10/2016  . Headache 04/09/2016  . Thrombocytosis ([place]) 04/09/2016  . Alcohol use 02/23/2016  . Low back pain 02/23/2016  . Leg cramps 02/02/2016  . Essential hypertension 02/02/2016  . Hyperlipidemia, mixed 02/02/2016  . Abnormal liver function tests 02/02/2016  . Preventative health care 10/27/2015  . Dysmenorrhea 10/27/2015  . Abscess, umbilical 44/81/8563  . Obesity 04/15/2015  .  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 Pain in joint, ankle and foot 12/19/2014  . Insomnia 07/09/2014  . Allergic rhinitis 05/06/14  . GERD (gastroesophageal reflux disease) 2014-05-06  . Depression with anxiety May 06, 2014  . PCOS (polycystic ovarian syndrome) 2014/05/06    Past Surgical History:  Procedure Laterality Date  . ADENOIDECTOMY    . Adnoids    . TYMPANOSTOMY TUBE PLACEMENT    . TYMPANOSTOMY TUBE PLACEMENT      OB History    No data available       Home Medications    Prior to Admission medications   Medication Sig Start Date End Date Taking? Authorizing Provider  albuterol (PROVENTIL HFA;VENTOLIN HFA) 108 (90 Base) MCG/ACT inhaler Inhale 2 puffs into the lungs every 6 (six) hours as needed for wheezing or shortness of breath. 11/11/15   Shelda Pal, DO  ALPRAZolam Duanne Moron) 0.25 MG tablet Take 1 tablet (0.25 mg total) by mouth 2 (two) times daily as needed for anxiety. 01/21/16   Mosie Lukes, MD  ARIPiprazole (ABILIFY) 2 MG tablet TAKE ONE TABLET BY MOUTH DAILY 06/08/16   Mosie Lukes, MD  ARIPiprazole (ABILIFY) 2 MG tablet Take 1 tablet (2 mg total) by mouth daily. 06/09/16   Mosie Lukes, MD  hydrochlorothiazide (HYDRODIURIL) 25 MG tablet TAKE ONE TABLET BY MOUTH DAILY 05/08/16   Debbrah Alar, NP  ibuprofen (ADVIL,MOTRIN) 800 MG tablet Take 1 tablet (800 mg total) by mouth every 8 (eight) hours as needed. 12/26/15  Mosie Lukes, MD  KRILL OIL PO Take by mouth.    Historical Provider, MD  Levonorgestrel-Ethinyl Estradiol (AMETHIA,CAMRESE) 0.15-0.03 &0.01 MG tablet Take 1 tablet by mouth daily. 10/15/15   Mosie Lukes, MD  methylphenidate (RITALIN) 5 MG tablet Take 1 tablet (5 mg total) by mouth 2 (two) times daily. 06/09/16   Mosie Lukes, MD  metoprolol succinate (TOPROL-XL) 50 MG 24 hr tablet Take 1 tablet (50 mg total) by mouth daily. Take with or immediately following a meal. 02/04/16   Mosie Lukes, MD  mometasone-formoterol (DULERA) 100-5  MCG/ACT AERO Inhale 2 puffs into the lungs 2 (two) times daily. 01/07/16   Debbrah Alar, NP  montelukast (SINGULAIR) 10 MG tablet Take 1 tablet (10 mg total) by mouth at bedtime. 01/03/16   Mosie Lukes, MD  omeprazole (PRILOSEC) 40 MG capsule Take 1 capsule (40 mg total) by mouth daily. 02/20/16   Mosie Lukes, MD  ondansetron (ZOFRAN) 4 MG tablet Take 1 tablet (4 mg total) by mouth every 6 (six) hours. 06/15/15   Merrily Pew, MD  POTASSIUM CHLORIDE PO Take by mouth.    Historical Provider, MD  ranitidine (ZANTAC) 150 MG tablet Take 1 tablet (150 mg total) by mouth 2 (two) times daily. 01/03/16   Mosie Lukes, MD  valACYclovir (VALTREX) 1000 MG tablet 2 tab by mouth in AM and 2 tabs by mouth 12 hrs later at start of cold sore 01/06/16   Mosie Lukes, MD  venlafaxine XR (EFFEXOR-XR) 150 MG 24 hr capsule TAKE ONE CAPSULE BY MOUTH DAILY WITH BREAKFAST WITH 75 MG CAP 06/08/16   Mosie Lukes, MD  zolpidem (AMBIEN) 5 MG tablet Take 1 tablet (5 mg total) by mouth at bedtime as needed for sleep. 12/10/15   Mosie Lukes, MD    Family History Family History  Problem Relation Age of Onset  . Hypertension Mother   . Heart disease Mother     CAD, stent at 46, s/p cardiac arrest with Defib  . Hyperlipidemia Mother   . Diabetes Mother     s/p gastric bypass  . Obesity Mother     s/p gastric bypass  . Hyperlipidemia Father   . Hypertension Father   . Asthma Sister   . Hyperlipidemia Brother   . Stroke Maternal Grandmother   . Thyroid disease Maternal Grandmother   . Heart disease Maternal Grandfather     MI first at 39, s/p stents and CABG  . Hyperlipidemia Maternal Grandfather   . Hypertension Maternal Grandfather   . Stroke Maternal Grandfather   . Cancer Maternal Grandfather     melanoma  . Aneurysm Maternal Grandfather     brain  . Kidney disease Maternal Grandfather     tumor  . Heart disease Paternal Grandmother   . Cancer Paternal Lucilla Edin and brain  .  Heart disease Maternal Uncle   . Vision loss Maternal Uncle     Social History Social History  Substance Use Topics  . Smoking status: Current Some Day Smoker    Packs/day: 1.00    Types: Cigarettes  . Smokeless tobacco: Current User    Types: Snuff  . Alcohol use 0.0 oz/week     Comment: every 6 months     Allergies   Oxycodone; Seroquel [quetiapine fumarate]; and Trazodone and nefazodone   Review of Systems Review of Systems  All other systems reviewed and are negative.    Physical Exam Updated  Vital Signs BP 125/81 (BP Location: Left Arm)   Pulse 65   Temp 97.6 F (36.4 C) (Oral)   Resp 16   Ht 5\' 8"  (1.727 m)   Wt 102.5 kg   LMP  (LMP Unknown) Comment: on birth control pills and does not get regular period.  SpO2 100%   BMI 34.36 kg/m   Physical Exam  Constitutional: She is oriented to person, place, and time. She appears well-developed and well-nourished.  Non-toxic appearance. No distress.  HENT:  Head: Normocephalic and atraumatic.  Eyes: Conjunctivae, EOM and lids are normal. Pupils are equal, round, and reactive to light.  Neck: Normal range of motion. Neck supple. No tracheal deviation present. No thyroid mass present.  Cardiovascular: Normal rate, regular rhythm and normal heart sounds.  Exam reveals no gallop.   No murmur heard. Pulmonary/Chest: Effort normal and breath sounds normal. No stridor. No respiratory distress. She has no decreased breath sounds. She has no wheezes. She has no rhonchi. She has no rales.  Abdominal: Soft. Normal appearance and bowel sounds are normal. She exhibits no distension. There is no tenderness. There is no rebound and no CVA tenderness.  Musculoskeletal: Normal range of motion. She exhibits no edema or tenderness.  Neurological: She is alert and oriented to person, place, and time. She has normal strength. No cranial nerve deficit or sensory deficit. GCS eye subscore is 4. GCS verbal subscore is 5. GCS motor subscore is  6.  Skin: Skin is warm and dry. No abrasion and no rash noted.  Psychiatric: She has a normal mood and affect. Her speech is normal and behavior is normal. She is not actively hallucinating. Thought content is not paranoid and not delusional. She does not express impulsivity or inappropriate judgment. She expresses no suicidal plans and no homicidal plans.  Nursing note and vitals reviewed.    ED Treatments / Results  Labs (all labs ordered are listed, but only abnormal results are displayed) Labs Reviewed - No data to display  EKG  EKG Interpretation None       Radiology No results found.  Procedures Procedures (including critical care time)  Medications Ordered in ED Medications - No data to display   Initial Impression / Assessment and Plan / ED Course  I have reviewed the triage vital signs and the nursing notes.  Pertinent labs & imaging results that were available during my care of the patient were reviewed by me and considered in my medical decision making (see chart for details).     Patient has no active suicidal thoughts or plan this time. Spoke with patient's physician who has been prescribing her psychiatric medications. That physician Dr.blyth, also spoke with the patient here in the ED and has been medication adjustment. Both patient, spouse, and physician or come up with a plan that the patient will be discharged at this time and will return if her symptoms worsen and will see her doctor in 3 days.  Final Clinical Impressions(s) / ED Diagnoses   Final diagnoses:  None    New Prescriptions New Prescriptions   No medications on file     Lacretia Leigh, MD 06/12/16 0800

## 2016-06-15 ENCOUNTER — Ambulatory Visit (INDEPENDENT_AMBULATORY_CARE_PROVIDER_SITE_OTHER): Payer: BLUE CROSS/BLUE SHIELD | Admitting: Family Medicine

## 2016-06-15 ENCOUNTER — Encounter: Payer: Self-pay | Admitting: Family Medicine

## 2016-06-15 DIAGNOSIS — F418 Other specified anxiety disorders: Secondary | ICD-10-CM | POA: Diagnosis not present

## 2016-06-15 DIAGNOSIS — H6590 Unspecified nonsuppurative otitis media, unspecified ear: Secondary | ICD-10-CM | POA: Insufficient documentation

## 2016-06-15 DIAGNOSIS — Z789 Other specified health status: Secondary | ICD-10-CM

## 2016-06-15 DIAGNOSIS — Z7289 Other problems related to lifestyle: Secondary | ICD-10-CM

## 2016-06-15 DIAGNOSIS — I10 Essential (primary) hypertension: Secondary | ICD-10-CM

## 2016-06-15 DIAGNOSIS — F909 Attention-deficit hyperactivity disorder, unspecified type: Secondary | ICD-10-CM | POA: Diagnosis not present

## 2016-06-15 DIAGNOSIS — H6503 Acute serous otitis media, bilateral: Secondary | ICD-10-CM

## 2016-06-15 NOTE — Progress Notes (Signed)
Subjective:  I acted as a Education administrator for Dr. Charlett Blake. [place], [place]   Patient ID: Kathryn [name], female    DOB: 05/17/1995, 21 y.o.   MRN: [id number]  Chief Complaint  Patient presents with  . Hospitalization Follow-up    HPI  Patient is in today for a hospital follow up. Patient was recently in the hospital for severe anxiety and depression. She states today she is doing much better now that her medication have been changed. Abilify was stopped 2 days ago and Venlafaxine has been increased to 225 mg. No xanax has been needed and she stayed with friends over the weekend to help her mood and she felt safe and was able to rest. She continues to struggle with anxiety and anhedonia but has not active suicidal plan and she believes she is able to contract for safety. She has noted some increased head congestion and ear pressure over past few days. No fevers, chills or signs of acute illness.   Patient Care Team: Mosie Lukes, MD as PCP - General (Family Medicine)   Past Medical History:  Diagnosis Date  . Abnormal liver function tests 02/02/2016  . ADHD 06/10/2016  . Alcohol use 02/23/2016  . Allergy   . Anxiety   . Bipolar disorder ([place]) Apr 19, 2014   PTSD s/p death of best friend   . Depression   . Depression with anxiety Apr 19, 2014  . Depression with anxiety 04/19/14   PTSD s/p death of best friend   . Essential hypertension 02/02/2016  . GERD (gastroesophageal reflux disease)   . 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 Headache 04/09/2016  . Hyperlipidemia, mixed 02/02/2016  . Migraine   . Obesity 04/15/2015  . Ovarian cyst   . PCOS (polycystic ovarian syndrome)   . Preventative health care 10/27/2015  . Thrombocytosis ([place]) 04/09/2016    Past Surgical History:  Procedure Laterality Date  . ADENOIDECTOMY    . Adnoids    . TYMPANOSTOMY TUBE PLACEMENT    . TYMPANOSTOMY TUBE PLACEMENT      Family History  Problem Relation Age of Onset  . Hypertension Mother   . Heart disease Mother    CAD, stent at 89, s/p cardiac arrest with Defib  . Hyperlipidemia Mother   . Diabetes Mother     s/p gastric bypass  . Obesity Mother     s/p gastric bypass  . Hyperlipidemia Father   . Hypertension Father   . Asthma Sister   . Hyperlipidemia Brother   . Stroke Maternal Grandmother   . Thyroid disease Maternal Grandmother   . Heart disease Maternal Grandfather     MI first at 69, s/p stents and CABG  . Hyperlipidemia Maternal Grandfather   . Hypertension Maternal Grandfather   . Stroke Maternal Grandfather   . Cancer Maternal Grandfather     melanoma  . Aneurysm Maternal Grandfather     brain  . Kidney disease Maternal Grandfather     tumor  . Heart disease Paternal Grandmother   . Cancer Paternal Lucilla Edin and brain  . Heart disease Maternal Uncle   . Vision loss Maternal Uncle     Social History   Social History  . Marital status: Single    Spouse name: N/A  . Number of children: N/A  . Years of education: N/A   Occupational History  . Not on file.   Social History Main Topics  . Smoking status: Current Some Day Smoker    Packs/day: 1.00  Types: Cigarettes  . Smokeless tobacco: Current User    Types: Snuff  . Alcohol use 0.0 oz/week     Comment: every 6 months  . Drug use: No  . Sexual activity: Yes    Birth control/ protection: Pill     Comment: lives with partner, works for [employer], no diewtary restrictions.    Other Topics Concern  . Not on file   Social History Narrative  . No narrative on file    Outpatient Medications Prior to Visit  Medication Sig Dispense Refill  . albuterol (PROVENTIL HFA;VENTOLIN HFA) 108 (90 Base) MCG/ACT inhaler Inhale 2 puffs into the lungs every 6 (six) hours as needed for wheezing or shortness of breath. 1 Inhaler 0  . ALPRAZolam (XANAX) 0.25 MG tablet Take 1-3 tablets by mouth prn severe anxiety daily 20 tablet 0  . hydrochlorothiazide (HYDRODIURIL) 25 MG tablet TAKE ONE TABLET BY MOUTH DAILY 30  tablet 2  . ibuprofen (ADVIL,MOTRIN) 800 MG tablet Take 1 tablet (800 mg total) by mouth every 8 (eight) hours as needed. 30 tablet 1  . KRILL OIL PO Take by mouth.    . Levonorgestrel-Ethinyl Estradiol (AMETHIA,CAMRESE) 0.15-0.03 &0.01 MG tablet Take 1 tablet by mouth daily. 1 Package 4  . methylphenidate (RITALIN) 5 MG tablet Take 1 tablet (5 mg total) by mouth 2 (two) times daily. 60 tablet 0  . metoprolol succinate (TOPROL-XL) 50 MG 24 hr tablet Take 1 tablet (50 mg total) by mouth daily. Take with or immediately following a meal. 30 tablet 3  . mometasone-formoterol (DULERA) 100-5 MCG/ACT AERO Inhale 2 puffs into the lungs 2 (two) times daily. 1 Inhaler 1  . montelukast (SINGULAIR) 10 MG tablet Take 1 tablet (10 mg total) by mouth at bedtime. 30 tablet 6  . omeprazole (PRILOSEC) 40 MG capsule Take 1 capsule (40 mg total) by mouth daily. 30 capsule 3  . ondansetron (ZOFRAN) 4 MG tablet Take 1 tablet (4 mg total) by mouth every 6 (six) hours. 12 tablet 0  . POTASSIUM CHLORIDE PO Take by mouth.    . ranitidine (ZANTAC) 150 MG tablet Take 1 tablet (150 mg total) by mouth 2 (two) times daily. 180 tablet 1  . valACYclovir (VALTREX) 1000 MG tablet 2 tab by mouth in AM and 2 tabs by mouth 12 hrs later at start of cold sore 20 tablet 0  . Venlafaxine HCl 225 MG TB24 Take 1 tablet (225 mg total) by mouth daily. 30 each 1  . zolpidem (AMBIEN) 5 MG tablet Take 1 tablet (5 mg total) by mouth at bedtime as needed for sleep. 30 tablet 2  . ARIPiprazole (ABILIFY) 2 MG tablet TAKE ONE TABLET BY MOUTH DAILY 30 tablet 0   No facility-administered medications prior to visit.     Allergies  Allergen Reactions  . Oxycodone Nausea Only    Oxycontin also  . Seroquel [Quetiapine Fumarate]     Mental status changes  . Trazodone And Nefazodone     Headache , "feels hung over"    Review of Systems  Constitutional: Negative for fever and malaise/fatigue.  HENT: Positive for congestion and ear pain. Negative  for ear discharge and hearing loss.   Eyes: Negative for blurred vision.  Respiratory: Negative for sputum production and shortness of breath.   Cardiovascular: Negative for chest pain, palpitations and leg swelling.  Gastrointestinal: Negative for abdominal pain, blood in stool and nausea.  Genitourinary: Negative for dysuria and frequency.  Musculoskeletal: Negative for falls.  Skin:  Negative for rash.  Neurological: Negative for dizziness, loss of consciousness and headaches.  Endo/Heme/Allergies: Negative for environmental allergies.  Psychiatric/Behavioral: Positive for depression. Negative for hallucinations, substance abuse and suicidal ideas. The patient is nervous/anxious.        Objective:    Physical Exam  Constitutional: She is oriented to person, place, and time. She appears well-developed and well-nourished. No distress.  HENT:  Head: Normocephalic and atraumatic.  Nose: Nose normal.  TMs dull and retracted  Eyes: Right eye exhibits no discharge. Left eye exhibits no discharge.  Neck: Normal range of motion. Neck supple.  Cardiovascular: Normal rate and regular rhythm.   No murmur heard. Pulmonary/Chest: Effort normal and breath sounds normal.  Abdominal: Soft. Bowel sounds are normal. There is no tenderness.  Musculoskeletal: She exhibits no edema.  Neurological: She is alert and oriented to person, place, and time.  Skin: Skin is warm and dry.  Psychiatric: She has a normal mood and affect.  Nursing note and vitals reviewed.   BP 102/80 (BP Location: Left Arm, Patient Position: Sitting, Cuff Size: Normal)   Pulse 66   Temp 98.2 F (36.8 C) (Oral)   Resp 18   Wt 226 lb 9.6 oz (102.8 kg)   LMP  (LMP Unknown) Comment: on birth control pills and does not get regular period.  SpO2 98%   BMI 34.45 kg/m  Wt Readings from Last 3 Encounters:  06/15/16 226 lb 9.6 oz (102.8 kg)  06/12/16 226 lb (102.5 kg)  06/09/16 226 lb (102.5 kg)   BP Readings from Last 3  Encounters:  06/15/16 102/80  06/12/16 125/81  06/09/16 122/84     Immunization History  Administered Date(s) Administered  . Influenza-Unspecified 12/01/2014, 12/30/2015    Health Maintenance  Topic Date Due  . HIV Screening  01/11/2010  . TETANUS/TDAP  01/11/2014  . CHLAMYDIA SCREENING  12/18/2015  . PAP SMEAR  01/12/2016  . INFLUENZA VACCINE  09/16/2016    Lab Results  Component Value Date   WBC 10.2 04/09/2016   HGB 14.1 04/09/2016   HCT 41.4 04/09/2016   PLT 378.0 04/09/2016   GLUCOSE 95 04/09/2016   CHOL 205 (H) 01/22/2016   TRIG 218.0 (H) 01/22/2016   HDL 44.30 01/22/2016   LDLDIRECT 138.0 01/22/2016   ALT 75 (H) 04/09/2016   AST 52 (H) 04/09/2016   NA 140 04/09/2016   K 3.8 04/09/2016   CL 105 04/09/2016   CREATININE 0.87 04/09/2016   BUN 12 04/09/2016   CO2 30 04/09/2016   TSH 1.89 01/22/2016    Lab Results  Component Value Date   TSH 1.89 01/22/2016   Lab Results  Component Value Date   WBC 10.2 04/09/2016   HGB 14.1 04/09/2016   HCT 41.4 04/09/2016   MCV 93.0 04/09/2016   PLT 378.0 04/09/2016   Lab Results  Component Value Date   NA 140 04/09/2016   K 3.8 04/09/2016   CO2 30 04/09/2016   GLUCOSE 95 04/09/2016   BUN 12 04/09/2016   CREATININE 0.87 04/09/2016   BILITOT 0.4 04/09/2016   ALKPHOS 59 04/09/2016   AST 52 (H) 04/09/2016   ALT 75 (H) 04/09/2016   PROT 7.1 04/09/2016   ALBUMIN 4.6 04/09/2016   CALCIUM 9.7 04/09/2016   ANIONGAP 11 03/03/2015   GFR 87.16 04/09/2016   Lab Results  Component Value Date   CHOL 205 (H) 01/22/2016   Lab Results  Component Value Date   HDL 44.30 01/22/2016   No results  found for: [facility] Lab Results  Component Value Date   TRIG 218.0 (H) 01/22/2016   Lab Results  Component Value Date   CHOLHDL 5 01/22/2016   No results found for: HGBA1C       Assessment & Plan:   Problem List Items Addressed This Visit    Depression with anxiety    Was seen in ED on 4/27 after leaving work on  4/26 with a panic attack and suickdal thoughts. She contracted for safety and has been staying with a friend over the weekend  We stopped the Abilify on 4/27 and increased Venlafaxine to 225 mg po daily.  Has been out of work since 4/26. 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      Relevant Orders   Ambulatory referral to Psychiatry   Essential hypertension    Well controlled, no changes to meds. Encouraged heart healthy diet such as the DASH diet and exercise as tolerated.       Alcohol use    Only had alcohol 3 x since new year, encouraged to avoid all alcohol      ADHD    Tolerating Ritalin no changes.       SOM (serous otitis media)    Cold symptoms recently encouraged flonase and nasal saline, mucinex and vit c and zinc         I have discontinued Ms. Yakubov's ARIPiprazole. I am also having her maintain her ondansetron, Levonorgestrel-Ethinyl Estradiol, albuterol, zolpidem, ibuprofen, montelukast, ranitidine, valACYclovir, mometasone-formoterol, metoprolol succinate, KRILL OIL PO, POTASSIUM CHLORIDE PO, omeprazole, hydrochlorothiazide, methylphenidate, ALPRAZolam, and Venlafaxine HCl.  No orders of the defined types were placed in this encounter.   CMA served as Education administrator during this visit. History, Physical and Plan performed by medical provider. Documentation and orders reviewed and attested to.  Penni Homans, MD

## 2016-06-15 NOTE — Assessment & Plan Note (Signed)
Well controlled, no changes to meds. Encouraged heart healthy diet such as the DASH diet and exercise as tolerated.  °

## 2016-06-15 NOTE — Progress Notes (Signed)
Pre visit review using our clinic review tool, if applicable. No additional management support is needed unless otherwise documented below in the visit note. 

## 2016-06-15 NOTE — Assessment & Plan Note (Signed)
Was seen in ED on 4/27 after leaving work on 4/26 with a panic attack and suickdal thoughts. She contracted for safety and has been staying with a friend over the weekend  We stopped the Abilify on 4/27 and increased Venlafaxine to 225 mg po daily.  Has been out of work since 4/26. 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

## 2016-06-15 NOTE — Assessment & Plan Note (Signed)
Cold symptoms recently encouraged flonase and nasal saline, mucinex and vit c and zinc

## 2016-06-15 NOTE — Assessment & Plan Note (Signed)
Tolerating Ritalin no changes.

## 2016-06-15 NOTE — Assessment & Plan Note (Signed)
Only had alcohol 3 x since new year, encouraged to avoid all alcohol

## 2016-06-15 NOTE — Patient Instructions (Signed)
Zinc, coldeeze, vitamin C 1000, elderberry, flonase and nasal saline, Mucinex twice daily, fluids 64 oz  Major Depressive Disorder, Adult Major depressive disorder (MDD) is a mental health condition. MDD often makes you feel sad, hopeless, or helpless. MDD can also cause symptoms in your body. MDD can affect your:  Work.  School.  Relationships.  Other normal activities. MDD can range from mild to very bad. It may occur once (single episode MDD). It can also occur many times (recurrent MDD). The main symptoms of MDD often include:  Feeling sad, depressed, or irritable most of the time.  Loss of interest. MDD symptoms also include:  Sleeping too much or too little.  Eating too much or too little.  A change in your weight.  Feeling tired (fatigue) or having low energy.  Feeling worthless.  Feeling guilty.  Trouble making decisions.  Trouble thinking clearly.  Thoughts of suicide or harming others.  Feeling weak.  Feeling agitated.  Keeping yourself from being around other people (isolation). Follow these instructions at home: Activity   Do these things as told by your doctor:  Go back to your normal activities.  Exercise regularly.  Spend time outdoors. Alcohol   Talk with your doctor about how alcohol can affect your antidepressant medicines.  Do not drink alcohol. Or, limit how much alcohol you drink.  This means no more than 1 drink a day for nonpregnant women and 2 drinks a day for men. One drink equals one of these:  12 oz of beer.  5 oz of wine.  1 oz of hard liquor. General instructions   Take over-the-counter and prescription medicines only as told by your doctor.  Eat a healthy diet.  Get plenty of sleep.  Find activities that you enjoy. Make time to do them.  Think about joining a support group. Your doctor may be able to suggest a group for you.  Keep all follow-up visits as told by your doctor. This is important. Where to find  more information:   [employer] on Mental Illness:  www.nami.org  [place] National Institute of Mental Health:  https://carter.com/  National Suicide Prevention Lifeline:  (984)818-7426. This is free, 24-hour help. Contact a doctor if:  Your symptoms get worse.  You have new symptoms. Get help right away if:  You self-harm.  You see, hear, taste, smell, or feel things that are not present (hallucinate). If you ever feel like you may hurt yourself or others, or have thoughts about taking your own life, get help right away. You can go to your nearest emergency department or call:  Your local emergency services (911 in the [place]  A suicide crisis helpline, such as the [facility]:  986-033-6722. This is open 24 hours a day. This information is not intended to replace advice given to you by your health care provider. Make sure you discuss any questions you have with your health care provider. Document Released: 01/14/2015 Document Revised: 10/20/2015 Document Reviewed: 10/20/2015 Elsevier Interactive Patient Education  2017 [employer].

## 2016-06-30 ENCOUNTER — Other Ambulatory Visit: Payer: Self-pay | Admitting: Family Medicine

## 2016-07-03 ENCOUNTER — Encounter (HOSPITAL_BASED_OUTPATIENT_CLINIC_OR_DEPARTMENT_OTHER): Payer: Self-pay

## 2016-07-03 ENCOUNTER — Emergency Department (HOSPITAL_BASED_OUTPATIENT_CLINIC_OR_DEPARTMENT_OTHER)
Admission: EM | Admit: 2016-07-03 | Discharge: 2016-07-03 | Disposition: A | Discharge status: Home / Self Care | Payer: BLUE CROSS/BLUE SHIELD | Attending: Dermatology | Admitting: Dermatology

## 2016-07-03 DIAGNOSIS — F909 Attention-deficit hyperactivity disorder, unspecified type: Secondary | ICD-10-CM | POA: Diagnosis not present

## 2016-07-03 DIAGNOSIS — Z5321 Procedure and treatment not carried out due to patient leaving prior to being seen by health care provider: Secondary | ICD-10-CM | POA: Insufficient documentation

## 2016-07-03 DIAGNOSIS — F17290 Nicotine dependence, other tobacco product, uncomplicated: Secondary | ICD-10-CM | POA: Diagnosis not present

## 2016-07-03 DIAGNOSIS — Z79899 Other long term (current) drug therapy: Secondary | ICD-10-CM | POA: Insufficient documentation

## 2016-07-03 DIAGNOSIS — I10 Essential (primary) hypertension: Secondary | ICD-10-CM | POA: Diagnosis not present

## 2016-07-03 DIAGNOSIS — F17210 Nicotine dependence, cigarettes, uncomplicated: Secondary | ICD-10-CM | POA: Insufficient documentation

## 2016-07-03 DIAGNOSIS — R51 Headache: Secondary | ICD-10-CM | POA: Insufficient documentation

## 2016-07-03 NOTE — ED Notes (Signed)
Pt states she has to go to work, informed her that the doctor should see her next, she still has to leave

## 2016-07-03 NOTE — ED Triage Notes (Signed)
Pt c/o migraine type headache since 3pm with n/v, light sensitivity, tried ibuprofen at 1800 without relief

## 2016-07-07 ENCOUNTER — Ambulatory Visit: Payer: Self-pay | Admitting: Family Medicine

## 2016-07-07 DIAGNOSIS — Z0289 Encounter for other administrative examinations: Secondary | ICD-10-CM

## 2016-08-31 ENCOUNTER — Encounter: Payer: Self-pay | Admitting: Family Medicine

## 2016-08-31 ENCOUNTER — Ambulatory Visit (INDEPENDENT_AMBULATORY_CARE_PROVIDER_SITE_OTHER): Payer: BLUE CROSS/BLUE SHIELD | Admitting: Family Medicine

## 2016-08-31 VITALS — BP 118/78 | HR 95 | Temp 98.4°F | Ht 68.0 in | Wt 223.0 lb

## 2016-08-31 DIAGNOSIS — F419 Anxiety disorder, unspecified: Secondary | ICD-10-CM

## 2016-08-31 MED ORDER — HYDROXYZINE HCL 25 MG PO TABS: 25.0000 mg | ORAL_TABLET | Freq: Three times a day (TID) | ORAL | 1 refills | Status: DC | PRN

## 2016-08-31 NOTE — Progress Notes (Signed)
Chief Complaint  Patient presents with  . Anxiety    Subjective Kathryn [name] presents for f/u anxiety/depression. She just got back from rehab and is no longer on Xanax, Ritalin or Ambien. She follows with a psychiatrist from her rehabilitation, however it is difficult to get an appointment so she followed up with [name].  Saw PCP 4/30, Abilify was stopped after increase in Effexor to 225 mg daily. Over the past 3 days, she's had 2 severe panic attacks. She is wondering if there is anything she can take for when this occurs. She feels she is overall doing well with venlafaxine 225 mg daily. She was also recently started on Seroquel 50 mg at night as needed for sleep. She feels this is working well despite not having a good reaction to it in the past. Has tried Abilify, Prozac. Has failed Abilify, Prozac. Reports healthy diet overall. Reports getting routine physical  No thoughts of harming self or others. No self-medication with alcohol, prescription drugs or illicit drugs.  ROS Psych: No homicidal or suicidal thoughts  Past Medical History:  Diagnosis Date  . Abnormal liver function tests 02/02/2016  . ADHD 06/10/2016  . Alcohol use 02/23/2016  . Allergy   . Anxiety   . Depression   . Depression with anxiety Apr 21, 2014   PTSD s/p death of best friend   . Essential hypertension 02/02/2016  . GERD (gastroesophageal reflux disease)   . 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 Headache 04/09/2016  . Hyperlipidemia, mixed 02/02/2016  . Migraine   . Ovarian cyst   . PCOS (polycystic ovarian syndrome)   . Preventative health care 10/27/2015  . Thrombocytosis ([place]) 04/09/2016   Family History  Problem Relation Age of Onset  . Hypertension Mother   . Heart disease Mother        CAD, stent at 58, s/p cardiac arrest with Defib  . Hyperlipidemia Mother   . Diabetes Mother        s/p gastric bypass  . Obesity Mother        s/p gastric bypass  . Hyperlipidemia Father   . Hypertension Father   .  Asthma Sister   . Hyperlipidemia Brother   . Stroke Maternal Grandmother   . Thyroid disease Maternal Grandmother   . Heart disease Maternal Grandfather        MI first at 61, s/p stents and CABG  . Hyperlipidemia Maternal Grandfather   . Hypertension Maternal Grandfather   . Stroke Maternal Grandfather   . Cancer Maternal Grandfather        melanoma  . Aneurysm Maternal Grandfather        brain  . Kidney disease Maternal Grandfather        tumor  . Heart disease Paternal Grandmother   . Cancer Paternal Lucilla Edin and brain  . Heart disease Maternal Uncle   . Vision loss Maternal Uncle    Allergies as of 08/31/2016      Reactions   Oxycodone Nausea Only   Oxycontin also   Seroquel [quetiapine Fumarate]    Mental status changes   Trazodone And Nefazodone    Headache , "feels hung over"      Medication List       Accurate as of 08/31/16 11:50 AM. Always use your most recent med list.          albuterol 108 (90 Base) MCG/ACT inhaler Commonly known as:  PROVENTIL HFA;VENTOLIN HFA Inhale 2 puffs into  the lungs every 6 (six) hours as needed for wheezing or shortness of breath.   hydrochlorothiazide 25 MG tablet Commonly known as:  HYDRODIURIL TAKE ONE TABLET BY MOUTH DAILY   hydrOXYzine 25 MG tablet Commonly known as:  ATARAX/VISTARIL Take 1 tablet (25 mg total) by mouth every 8 (eight) hours as needed for anxiety.   ibuprofen 800 MG tablet Commonly known as:  ADVIL,MOTRIN Take 1 tablet (800 mg total) by mouth every 8 (eight) hours as needed.   KRILL OIL PO Take by mouth.   Levonorgestrel-Ethinyl Estradiol 0.15-0.03 &0.01 MG tablet Commonly known as:  AMETHIA,CAMRESE Take 1 tablet by mouth daily.   mometasone-formoterol 100-5 MCG/ACT Aero Commonly known as:  DULERA Inhale 2 puffs into the lungs 2 (two) times daily.   montelukast 10 MG tablet Commonly known as:  SINGULAIR Take 1 tablet (10 mg total) by mouth at bedtime.   omeprazole 40 MG  capsule Commonly known as:  PRILOSEC Take 1 capsule (40 mg total) by mouth daily.   ondansetron 4 MG tablet Commonly known as:  ZOFRAN Take 1 tablet (4 mg total) by mouth every 6 (six) hours.   POTASSIUM CHLORIDE PO Take by mouth.   ranitidine 150 MG tablet Commonly known as:  ZANTAC Take 1 tablet (150 mg total) by mouth 2 (two) times daily.   SEROQUEL 50 MG tablet Generic drug:  QUEtiapine Take 1 tablet (50 mg total) by mouth at bedtime.   valACYclovir 1000 MG tablet Commonly known as:  VALTREX 2 tab by mouth in AM and 2 tabs by mouth 12 hrs later at start of cold sore   Venlafaxine HCl 225 MG Tb24 Take 1 tablet (225 mg total) by mouth daily.       Exam BP 118/78 (BP Location: Left Arm, Patient Position: Sitting, Cuff Size: Normal)   Pulse 95   Temp 98.4 F (36.9 C) (Oral)   Ht 5\' 8"  (1.727 m)   Wt 223 lb (101.2 kg)   LMP 08/01/2016   SpO2 99%   BMI 33.91 kg/m  General:  well developed, well nourished, in no apparent distress Neck: neck supple without adenopathy, thyromegaly, or masses Lungs:  clear to auscultation, breath sounds equal bilaterally, no respiratory distress Cardio:  regular rate and rhythm without murmurs, heart sounds without clicks or rubs Psych: well oriented with normal range of affect and age-appropriate judgement/insight, alert and oriented x4.  Assessment and Plan  Anxiety - Plan: hydrOXYzine (ATARAX/VISTARIL) 25 MG tablet, QUEtiapine (SEROQUEL) 50 MG tablet  Orders as above. Start Atarax as needed. Will stay away from Benzo's and hypnotics given issue with substance abuse. She is following with psychiatry and receiving counseling.  I will have her follow-up with her psychiatrist, Dr. Charlett Blake, her regular PCP, if that providers not available. The patient voiced understanding and agreement to the plan.  [place], DO 08/31/16 11:50 AM

## 2016-08-31 NOTE — Patient Instructions (Signed)
If you cannot get into your psychiatrist's office, follow up with Dr. Charlett Blake.   Let us know if you need anything.

## 2016-09-11 ENCOUNTER — Encounter: Payer: Self-pay | Admitting: Family Medicine

## 2016-09-11 ENCOUNTER — Ambulatory Visit (INDEPENDENT_AMBULATORY_CARE_PROVIDER_SITE_OTHER): Payer: BLUE CROSS/BLUE SHIELD | Admitting: Family Medicine

## 2016-09-11 VITALS — BP 120/90 | HR 96 | Temp 98.3°F | Ht 68.0 in | Wt 223.6 lb

## 2016-09-11 DIAGNOSIS — F418 Other specified anxiety disorders: Secondary | ICD-10-CM | POA: Diagnosis not present

## 2016-09-11 DIAGNOSIS — L989 Disorder of the skin and subcutaneous tissue, unspecified: Secondary | ICD-10-CM

## 2016-09-11 MED ORDER — BUSPIRONE HCL 7.5 MG PO TABS: 7.5000 mg | ORAL_TABLET | Freq: Three times a day (TID) | ORAL | 1 refills | Status: DC

## 2016-09-11 NOTE — Patient Instructions (Addendum)
Continue all meds, we are adding a new one. Continue counseling.   If you drop off forms, leave them for me, not Dr. Charlett Blake.  Let us know if you need anything.  Do not shower for the rest of the day. When you do wash it, use only soap and water. Do not vigorously scrub. Keep the area clean and dry.   Things to look out for: increasing pain not relieved by ibuprofen/acetaminophen, fevers, spreading redness, drainage of pus, or foul odor.

## 2016-09-11 NOTE — Progress Notes (Signed)
Chief Complaint  Patient presents with  . Depression    Anxiety-pt feels her meds are not working  . Abscess    in belly button    Subjective Kathryn [name] presents for f/u anxiety/depression.  Reports worsening anxiety and depression since treatemnt. Has tried/failed Prozac and Cymbalta. She is currently in Seroquel 50 mg as needed for sleep and Effexor XR 225 mg daily for her depression and anxiety, this was started by psychiatry. She is in group therapy for 8 hours a week and individual counseling for 1 hour per week. She reports that her psychiatry team had recommended she not return to work immediately which she did not adhere to. She is requesting for FMLA to be filled out from our office. Reports healthy diet overall. Reports getting routine exercise. No thoughts of harming self or others. No self-medication with alcohol, prescription drugs or illicit drugs.  The patient also has an area around her bellybutton that she says will get engorged from time to time. Typically it will drain, however this time it does not drain. She was recommended to see a general surgeon in the past by Dr. Charlett Blake. She has not done this and is interested in doing this now. She states that she does not need a referral. No fevers, drainage, streaking redness, or pain.  ROS Psych: No homicidal or suicidal thoughts Skin: As noted in HPI  Past Medical History:  Diagnosis Date  . Abnormal liver function tests 02/02/2016  . ADHD 06/10/2016  . Alcohol use 02/23/2016  . Allergy   . Anxiety   . Depression   . Depression with anxiety 04/22/14   PTSD s/p death of best friend   . Essential hypertension 02/02/2016  . GERD (gastroesophageal reflux disease)   . 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 Headache 04/09/2016  . Hyperlipidemia, mixed 02/02/2016  . Migraine   . Ovarian cyst   . PCOS (polycystic ovarian syndrome)   . Preventative health care 10/27/2015  . Thrombocytosis ([place]) 04/09/2016   Family History   Problem Relation Age of Onset  . Hypertension Mother   . Heart disease Mother        CAD, stent at 46, s/p cardiac arrest with Defib  . Hyperlipidemia Mother   . Diabetes Mother        s/p gastric bypass  . Obesity Mother        s/p gastric bypass  . Hyperlipidemia Father   . Hypertension Father   . Asthma Sister   . Hyperlipidemia Brother   . Stroke Maternal Grandmother   . Thyroid disease Maternal Grandmother   . Heart disease Maternal Grandfather        MI first at 18, s/p stents and CABG  . Hyperlipidemia Maternal Grandfather   . Hypertension Maternal Grandfather   . Stroke Maternal Grandfather   . Cancer Maternal Grandfather        melanoma  . Aneurysm Maternal Grandfather        brain  . Kidney disease Maternal Grandfather        tumor  . Heart disease Paternal Grandmother   . Cancer Paternal Lucilla Edin and brain  . Heart disease Maternal Uncle   . Vision loss Maternal Uncle    Allergies as of 09/11/2016      Reactions   Oxycodone Nausea Only   Oxycontin also   Seroquel [quetiapine Fumarate]    Mental status changes   Trazodone And Nefazodone  Headache , "feels hung over"      Medication List       Accurate as of 09/11/16  4:38 PM. Always use your most recent med list.          albuterol 108 (90 Base) MCG/ACT inhaler Commonly known as:  PROVENTIL HFA;VENTOLIN HFA Inhale 2 puffs into the lungs every 6 (six) hours as needed for wheezing or shortness of breath.   busPIRone 7.5 MG tablet Commonly known as:  BUSPAR Take 1 tablet (7.5 mg total) by mouth 3 (three) times daily.   hydrochlorothiazide 25 MG tablet Commonly known as:  HYDRODIURIL TAKE ONE TABLET BY MOUTH DAILY   hydrOXYzine 25 MG tablet Commonly known as:  ATARAX/VISTARIL Take 1 tablet (25 mg total) by mouth every 8 (eight) hours as needed for anxiety.   ibuprofen 800 MG tablet Commonly known as:  ADVIL,MOTRIN Take 1 tablet (800 mg total) by mouth every 8 (eight) hours as  needed.   KRILL OIL PO Take by mouth.   Levonorgestrel-Ethinyl Estradiol 0.15-0.03 &0.01 MG tablet Commonly known as:  AMETHIA,CAMRESE Take 1 tablet by mouth daily.   mometasone-formoterol 100-5 MCG/ACT Aero Commonly known as:  DULERA Inhale 2 puffs into the lungs 2 (two) times daily.   montelukast 10 MG tablet Commonly known as:  SINGULAIR Take 1 tablet (10 mg total) by mouth at bedtime.   omeprazole 40 MG capsule Commonly known as:  PRILOSEC Take 1 capsule (40 mg total) by mouth daily.   POTASSIUM CHLORIDE PO Take by mouth.   ranitidine 150 MG tablet Commonly known as:  ZANTAC Take 1 tablet (150 mg total) by mouth 2 (two) times daily.   SEROQUEL 50 MG tablet Generic drug:  QUEtiapine Take 1 tablet (50 mg total) by mouth at bedtime.   valACYclovir 1000 MG tablet Commonly known as:  VALTREX 2 tab by mouth in AM and 2 tabs by mouth 12 hrs later at start of cold sore   Venlafaxine HCl 225 MG Tb24 Take 1 tablet (225 mg total) by mouth daily.       Exam BP 120/90 (BP Location: Left Arm, Patient Position: Sitting, Cuff Size: Normal)   Pulse 96   Temp 98.3 F (36.8 C) (Oral)   Ht 5\' 8"  (1.727 m)   Wt 223 lb 9.6 oz (101.4 kg)   LMP 08/01/2016   SpO2 97%   BMI 34.00 kg/m  General:  well developed, well nourished, in no apparent distress Neck: neck supple without adenopathy, thyromegaly, or masses Lungs:  clear to auscultation, breath sounds equal bilaterally, no respiratory distress Cardio:  regular rate and rhythm without murmurs, heart sounds without clicks or rubs Skin: Engorged piece of tissue thru umbilicus. No warmth, TTP or drainage Psych: well oriented with normal range of affect and age-appropriate judgement/insight, alert and oriented x4.  Procedure note; incision and drainage Informed consent obtained. The area in her umbilicus was cleaned with alcohol. The area was anesthetized with 0.75 mL of 1% lidocaine with epinephrine. Once adequate anesthesia  was obtained, an incision was attempted with an 11 blade scalpel. The area was not supple and caved in without penetration. This was attempted a few more times without success.  I decided to cease the procedure as I started to question whether there is purulent material to be drained. There were no complications noted. The patient tolerated everything well.   Assessment and Plan  Anxiety with depression - Plan: busPIRone (BUSPAR) 7.5 MG tablet  Skin lesion  Orders as above.  Start BuSpar. Continue Effexor and Seroquel. I encouraged her to get back in with psychiatry. Continue with counseling. Counseled on diet and exercise affecting mood. Regarding the skin lesion on her umbilicus, I recommend she follow through with seeing a surgeon regarding this issue. Dr. Charlett Blake had recommended she see one of the past, which she is ready to do at this time now. F/u in 4 weeks with Dr. Charlett Blake or with psych, [place] to see me if no openings. The patient voiced understanding and agreement to the plan.  [place], DO 09/11/16 4:38 PM

## 2016-09-15 ENCOUNTER — Telehealth: Payer: Self-pay | Admitting: Family Medicine

## 2016-09-15 NOTE — Telephone Encounter (Signed)
Pt came in to drop of FMLA paperwork for completion. Please call pt when ready she will come in to pick up. Placed forms in front tray to forward to provider.    Phone: [phone]

## 2016-09-17 NOTE — Telephone Encounter (Signed)
Paperwork completed as much as possible, spoke with Dr. Charlett Blake regarding F/U, [4 wks] and have pt scheduled for 10/12/16 at 11:15am; also needed date to use for end period of continuous leave beginning on 09/08/16. Explained information on paperwork to Dr. Irene Limbo medical assistant to reiterate to provider; left paperwork with Angie/SLS 08/02

## 2016-09-21 NOTE — Telephone Encounter (Signed)
Spoke with Kathryn [name] on Friday about the 'single continuous time dates' on paperwork; called today and LMOM with contact name and number RE: paperwork is ready for pick-up/SLS 08/06

## 2016-10-12 ENCOUNTER — Ambulatory Visit: Payer: Self-pay | Admitting: Family Medicine

## 2016-10-13 ENCOUNTER — Encounter: Payer: Self-pay | Admitting: Family Medicine

## 2016-10-13 ENCOUNTER — Ambulatory Visit (INDEPENDENT_AMBULATORY_CARE_PROVIDER_SITE_OTHER): Payer: BLUE CROSS/BLUE SHIELD | Admitting: Family Medicine

## 2016-10-13 VITALS — BP 122/74 | HR 87 | Temp 98.5°F | Ht 68.0 in | Wt 221.2 lb

## 2016-10-13 DIAGNOSIS — F418 Other specified anxiety disorders: Secondary | ICD-10-CM | POA: Diagnosis not present

## 2016-10-13 DIAGNOSIS — I10 Essential (primary) hypertension: Secondary | ICD-10-CM | POA: Diagnosis not present

## 2016-10-13 DIAGNOSIS — E6609 Other obesity due to excess calories: Secondary | ICD-10-CM

## 2016-10-13 DIAGNOSIS — J029 Acute pharyngitis, unspecified: Secondary | ICD-10-CM

## 2016-10-13 DIAGNOSIS — R945 Abnormal results of liver function studies: Secondary | ICD-10-CM

## 2016-10-13 DIAGNOSIS — J189 Pneumonia, unspecified organism: Secondary | ICD-10-CM

## 2016-10-13 DIAGNOSIS — F419 Anxiety disorder, unspecified: Secondary | ICD-10-CM

## 2016-10-13 DIAGNOSIS — Z789 Other specified health status: Secondary | ICD-10-CM | POA: Diagnosis not present

## 2016-10-13 DIAGNOSIS — Z23 Encounter for immunization: Secondary | ICD-10-CM

## 2016-10-13 DIAGNOSIS — R7989 Other specified abnormal findings of blood chemistry: Secondary | ICD-10-CM

## 2016-10-13 DIAGNOSIS — N946 Dysmenorrhea, unspecified: Secondary | ICD-10-CM

## 2016-10-13 DIAGNOSIS — Z7289 Other problems related to lifestyle: Secondary | ICD-10-CM

## 2016-10-13 LAB — COMPREHENSIVE METABOLIC PANEL
ALT: 73 U/L — ABNORMAL HIGH (ref 0–35)
AST: 42 U/L — ABNORMAL HIGH (ref 0–37)
Albumin: 4.4 g/dL (ref 3.5–5.2)
Alkaline Phosphatase: 64 U/L (ref 39–117)
BUN: 10 mg/dL (ref 6–23)
CO2: 32 mEq/L (ref 19–32)
Calcium: 9.9 mg/dL (ref 8.4–10.5)
Chloride: 102 mEq/L (ref 96–112)
Creatinine, Ser: 0.93 mg/dL (ref 0.40–1.20)
GFR: 80.31 mL/min (ref >60.00)
Glucose, Bld: 90 mg/dL (ref 70–99)
Potassium: 4 mEq/L (ref 3.5–5.1)
Sodium: 140 mEq/L (ref 135–145)
Total Bilirubin: 0.5 mg/dL (ref 0.2–1.2)
Total Protein: 7.2 g/dL (ref 6.0–8.3)

## 2016-10-13 MED ORDER — MOMETASONE FURO-FORMOTEROL FUM 100-5 MCG/ACT IN AERO: 2.0000 | INHALATION_SPRAY | Freq: Two times a day (BID) | RESPIRATORY_TRACT | 1 refills | Status: DC

## 2016-10-13 MED ORDER — LEVONORGEST-ETH ESTRAD 91-DAY 0.15-0.03 &0.01 MG PO TABS: 1.0000 | ORAL_TABLET | Freq: Every day | ORAL | 0 refills | Status: DC

## 2016-10-13 MED ORDER — ALBUTEROL SULFATE HFA 108 (90 BASE) MCG/ACT IN AERS: 2.0000 | INHALATION_SPRAY | Freq: Four times a day (QID) | RESPIRATORY_TRACT | 0 refills | Status: DC | PRN

## 2016-10-13 MED ORDER — MONTELUKAST SODIUM 10 MG PO TABS: 10.0000 mg | ORAL_TABLET | Freq: Every day | ORAL | 0 refills | Status: DC

## 2016-10-13 MED ORDER — VALACYCLOVIR HCL 1 G PO TABS: ORAL_TABLET | ORAL | 0 refills | Status: DC

## 2016-10-13 MED ORDER — BUPROPION HCL ER (XL) 150 MG PO TB24: 150.0000 mg | ORAL_TABLET | Freq: Every day | ORAL | 0 refills | Status: DC

## 2016-10-13 MED ORDER — OMEPRAZOLE 40 MG PO CPDR: 40.0000 mg | DELAYED_RELEASE_CAPSULE | Freq: Every day | ORAL | 0 refills | Status: DC

## 2016-10-13 MED ORDER — METOPROLOL SUCCINATE ER 50 MG PO TB24: 50.0000 mg | ORAL_TABLET | Freq: Every day | ORAL | 0 refills | Status: DC

## 2016-10-13 MED ORDER — QUETIAPINE FUMARATE 50 MG PO TABS: 50.0000 mg | ORAL_TABLET | Freq: Every day | ORAL | 0 refills | Status: DC

## 2016-10-13 MED ORDER — RANITIDINE HCL 150 MG PO TABS: 150.0000 mg | ORAL_TABLET | Freq: Two times a day (BID) | ORAL | 0 refills | Status: DC

## 2016-10-13 MED ORDER — HYDROCHLOROTHIAZIDE 25 MG PO TABS: 25.0000 mg | ORAL_TABLET | Freq: Every day | ORAL | 0 refills | Status: DC

## 2016-10-13 MED ORDER — VENLAFAXINE HCL ER 75 MG PO CP24: 150.0000 mg | ORAL_CAPSULE | Freq: Every day | ORAL | 0 refills | Status: DC

## 2016-10-13 NOTE — Patient Instructions (Addendum)
Stop Wellbutrin, in 2 weeks drop the venlafaxine to 150 mg daily x 1 month then drop to 75 mg daily til seen. Try 1/2 tab of Seroquel  Insomnia Insomnia is a sleep disorder that makes it difficult to fall asleep or to stay asleep. Insomnia can cause tiredness (fatigue), low energy, difficulty concentrating, mood swings, and poor performance at work or school. There are three different ways to classify insomnia:  Difficulty falling asleep.  Difficulty staying asleep.  Waking up too early in the morning.  Any type of insomnia can be long-term (chronic) or short-term (acute). Both are common. Short-term insomnia usually lasts for three months or less. Chronic insomnia occurs at least three times a week for longer than three months. What are the causes? Insomnia may be caused by another condition, situation, or substance, such as:  Anxiety.  Certain medicines.  Gastroesophageal reflux disease (GERD) or other gastrointestinal conditions.  Asthma or other breathing conditions.  Restless legs syndrome, sleep apnea, or other sleep disorders.  Chronic pain.  Menopause. This may include hot flashes.  Stroke.  Abuse of alcohol, tobacco, or illegal drugs.  Depression.  Caffeine.  Neurological disorders, such as Alzheimer disease.  An overactive thyroid (hyperthyroidism).  The cause of insomnia may not be known. What increases the risk? Risk factors for insomnia include:  Gender. Women are more commonly affected than men.  Age. Insomnia is more common as you get older.  Stress. This may involve your professional or personal life.  Income. Insomnia is more common in people with lower income.  Lack of exercise.  Irregular work schedule or night shifts.  Traveling between different time zones.  What are the signs or symptoms? If you have insomnia, trouble falling asleep or trouble staying asleep is the main symptom. This may lead to other symptoms, such as:  Feeling  fatigued.  Feeling nervous about going to sleep.  Not feeling rested in the morning.  Having trouble concentrating.  Feeling irritable, anxious, or depressed.  How is this treated? Treatment for insomnia depends on the cause. If your insomnia is caused by an underlying condition, treatment will focus on addressing the condition. Treatment may also include:  Medicines to help you sleep.  Counseling or therapy.  Lifestyle adjustments.  Follow these instructions at home:  Take medicines only as directed by your health care provider.  Keep regular sleeping and waking hours. Avoid naps.  Keep a sleep diary to help you and your health care provider figure out what could be causing your insomnia. Include: ? When you sleep. ? When you wake up during the night. ? How well you sleep. ? How rested you feel the next day. ? Any side effects of medicines you are taking. ? What you eat and drink.  Make your bedroom a comfortable place where it is easy to fall asleep: ? Put up shades or special blackout curtains to block light from outside. ? Use a white noise machine to block noise. ? Keep the temperature cool.  Exercise regularly as directed by your health care provider. Avoid exercising right before bedtime.  Use relaxation techniques to manage stress. Ask your health care provider to suggest some techniques that may work well for you. These may include: ? Breathing exercises. ? Routines to release muscle tension. ? Visualizing peaceful scenes.  Cut back on alcohol, caffeinated beverages, and cigarettes, especially close to bedtime. These can disrupt your sleep.  Do not overeat or eat spicy foods right before bedtime. This can lead  to digestive discomfort that can make it hard for you to sleep.  Limit screen use before bedtime. This includes: ? Watching TV. ? Using your smartphone, tablet, and computer.  Stick to a routine. This can help you fall asleep faster. Try to do a  quiet activity, brush your teeth, and go to bed at the same time each night.  Get out of bed if you are still awake after 15 minutes of trying to sleep. Keep the lights down, but try reading or doing a quiet activity. When you feel sleepy, go back to bed.  Make sure that you drive carefully. Avoid driving if you feel very sleepy.  Keep all follow-up appointments as directed by your health care provider. This is important. Contact a health care provider if:  You are tired throughout the day or have trouble in your daily routine due to sleepiness.  You continue to have sleep problems or your sleep problems get worse. Get help right away if:  You have serious thoughts about hurting yourself or someone else. This information is not intended to replace advice given to you by your health care provider. Make sure you discuss any questions you have with your health care provider. Document Released: 01/31/2000 Document Revised: 07/05/2015 Document Reviewed: 11/03/2013 Elsevier Interactive Patient Education  [employer].

## 2016-10-19 NOTE — Assessment & Plan Note (Signed)
Has recently under gone inpatient rehab at [facility] and is 101 days clean. She reports she feels better and continues in her support groups. 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

## 2016-10-19 NOTE — Assessment & Plan Note (Signed)
Encouraged DASH diet, decrease po intake and increase exercise as tolerated. Needs 7-8 hours of sleep nightly. Avoid trans fats, eat small, frequent meals every 4-5 hours with lean proteins, complex carbs and healthy fats. Minimize simple carbs 

## 2016-10-19 NOTE — Progress Notes (Signed)
Patient ID: Kathryn [name], female   DOB: March 31, 1995, 21 y.o.   MRN: [id number]   Subjective:    Patient ID: Kathryn [name], female    DOB: 1995-07-24, 21 y.o.   MRN: [id number]  Chief Complaint  Patient presents with  . Follow-up    Patient is here today to F/U with anxiety.    HPI Patient is in today for follow up. She has recently undergone inpatient rehab to stop drinking at [facility] and she is now in support group and doing well. She is switching jobs to avoid the crowd she got tangled up with. She has not had a drink for 101 days. She is undergoing trauma counseling. She is using Seroquel for sleep but overall is sleeping better. Still notes some anhedonia but denies suicidal ideation. Denies CP/palp/SOB/HA/congestion/fevers/GI or GU c/o. Taking meds as prescribed  Past Medical History:  Diagnosis Date  . Abnormal liver function tests 02/02/2016  . ADHD 06/10/2016  . Alcohol use 02/23/2016  . Allergy   . Anxiety   . Depression   . Depression with anxiety 05/04/2014   PTSD s/p death of best friend   . Essential hypertension 02/02/2016  . GERD (gastroesophageal reflux disease)   . 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 Headache 04/09/2016  . Hyperlipidemia, mixed 02/02/2016  . Migraine   . Ovarian cyst   . PCOS (polycystic ovarian syndrome)   . Preventative health care 10/27/2015  . Thrombocytosis ([place]) 04/09/2016    Past Surgical History:  Procedure Laterality Date  . ADENOIDECTOMY    . Adnoids    . TYMPANOSTOMY TUBE PLACEMENT    . TYMPANOSTOMY TUBE PLACEMENT      Family History  Problem Relation Age of Onset  . Hypertension Mother   . Heart disease Mother        CAD, stent at 17, s/p cardiac arrest with Defib  . Hyperlipidemia Mother   . Diabetes Mother        s/p gastric bypass  . Obesity Mother        s/p gastric bypass  . Hyperlipidemia Father   . Hypertension Father   . Asthma Sister   . Hyperlipidemia Brother   . Stroke Maternal Grandmother   . Thyroid disease  Maternal Grandmother   . Heart disease Maternal Grandfather        MI first at 76, s/p stents and CABG  . Hyperlipidemia Maternal Grandfather   . Hypertension Maternal Grandfather   . Stroke Maternal Grandfather   . Cancer Maternal Grandfather        melanoma  . Aneurysm Maternal Grandfather        brain  . Kidney disease Maternal Grandfather        tumor  . Heart disease Paternal Grandmother   . Cancer Paternal Lucilla Edin and brain  . Heart disease Maternal Uncle   . Vision loss Maternal Uncle     Social History   Social History  . Marital status: Single    Spouse name: N/A  . Number of children: N/A  . Years of education: N/A   Occupational History  . Not on file.   Social History Main Topics  . Smoking status: Current Some Day Smoker    Packs/day: 1.00    Types: Cigarettes  . Smokeless tobacco: Current User    Types: Snuff  . Alcohol use 0.0 oz/week     Comment: every 6 months  . Drug use: No  .  Sexual activity: Yes    Birth control/ protection: Pill     Comment: lives with partner, works for [employer], no diewtary restrictions.    Other Topics Concern  . Not on file   Social History Narrative  . No narrative on file    Outpatient Medications Prior to Visit  Medication Sig Dispense Refill  . ibuprofen (ADVIL,MOTRIN) 800 MG tablet Take 1 tablet (800 mg total) by mouth every 8 (eight) hours as needed. 30 tablet 1  . albuterol (PROVENTIL HFA;VENTOLIN HFA) 108 (90 Base) MCG/ACT inhaler Inhale 2 puffs into the lungs every 6 (six) hours as needed for wheezing or shortness of breath. 1 Inhaler 0  . hydrochlorothiazide (HYDRODIURIL) 25 MG tablet TAKE ONE TABLET BY MOUTH DAILY 30 tablet 2  . Levonorgestrel-Ethinyl Estradiol (AMETHIA,CAMRESE) 0.15-0.03 &0.01 MG tablet Take 1 tablet by mouth daily. 1 Package 4  . mometasone-formoterol (DULERA) 100-5 MCG/ACT AERO Inhale 2 puffs into the lungs 2 (two) times daily. 1 Inhaler 1  . montelukast  (SINGULAIR) 10 MG tablet Take 1 tablet (10 mg total) by mouth at bedtime. 30 tablet 6  . omeprazole (PRILOSEC) 40 MG capsule Take 1 capsule (40 mg total) by mouth daily. 30 capsule 3  . QUEtiapine (SEROQUEL) 50 MG tablet Take 1 tablet (50 mg total) by mouth at bedtime.    . ranitidine (ZANTAC) 150 MG tablet Take 1 tablet (150 mg total) by mouth 2 (two) times daily. 180 tablet 1  . valACYclovir (VALTREX) 1000 MG tablet 2 tab by mouth in AM and 2 tabs by mouth 12 hrs later at start of cold sore 20 tablet 0  . Venlafaxine HCl 225 MG TB24 Take 1 tablet (225 mg total) by mouth daily. 30 each 1  . busPIRone (BUSPAR) 7.5 MG tablet Take 1 tablet (7.5 mg total) by mouth 3 (three) times daily. 90 tablet 1  . hydrOXYzine (ATARAX/VISTARIL) 25 MG tablet Take 1 tablet (25 mg total) by mouth every 8 (eight) hours as needed for anxiety. 90 tablet 1  . KRILL OIL PO Take by mouth.    Marland Kitchen POTASSIUM CHLORIDE PO Take by mouth.     No facility-administered medications prior to visit.     Allergies  Allergen Reactions  . Oxycodone Nausea Only    Oxycontin also  . Seroquel [Quetiapine Fumarate]     Mental status changes  . Trazodone And Nefazodone     Headache , "feels hung over"    Review of Systems  Constitutional: Negative for fever and malaise/fatigue.  HENT: Negative for congestion.   Eyes: Negative for blurred vision.  Respiratory: Negative for shortness of breath.   Cardiovascular: Negative for chest pain, palpitations and leg swelling.  Gastrointestinal: Negative for abdominal pain, blood in stool and nausea.  Genitourinary: Negative for dysuria and frequency.  Musculoskeletal: Negative for falls.  Skin: Negative for rash.  Neurological: Negative for dizziness, loss of consciousness and headaches.  Endo/Heme/Allergies: Negative for environmental allergies.  Psychiatric/Behavioral: Positive for depression. The patient is nervous/anxious.        Objective:    Physical Exam  Constitutional: She  is oriented to person, place, and time. She appears well-developed and well-nourished. No distress.  HENT:  Head: Normocephalic and atraumatic.  Nose: Nose normal.  Eyes: Right eye exhibits no discharge. Left eye exhibits no discharge.  Neck: Normal range of motion. Neck supple.  Cardiovascular: Normal rate and regular rhythm.   No murmur heard. Pulmonary/Chest: Effort normal and breath sounds normal.  Abdominal: Soft. Bowel  sounds are normal. There is no tenderness.  Musculoskeletal: She exhibits no edema.  Neurological: She is alert and oriented to person, place, and time.  Skin: Skin is warm and dry.  Psychiatric: She has a normal mood and affect.  Nursing note and vitals reviewed.   BP 122/74 (BP Location: Right Arm, Patient Position: Sitting, Cuff Size: Normal)   Pulse 87   Temp 98.5 F (36.9 C) (Oral)   Ht 5\' 8"  (1.727 m)   Wt 221 lb 3.2 oz (100.3 kg)   LMP 10/11/2016   SpO2 98%   BMI 33.63 kg/m  Wt Readings from Last 3 Encounters:  10/13/16 221 lb 3.2 oz (100.3 kg)  09/11/16 223 lb 9.6 oz (101.4 kg)  08/31/16 223 lb (101.2 kg)     Lab Results  Component Value Date   WBC 10.2 04/09/2016   HGB 14.1 04/09/2016   HCT 41.4 04/09/2016   PLT 378.0 04/09/2016   GLUCOSE 90 10/13/2016   CHOL 205 (H) 01/22/2016   TRIG 218.0 (H) 01/22/2016   HDL 44.30 01/22/2016   LDLDIRECT 138.0 01/22/2016   ALT 73 (H) 10/13/2016   AST 42 (H) 10/13/2016   NA 140 10/13/2016   K 4.0 10/13/2016   CL 102 10/13/2016   CREATININE 0.93 10/13/2016   BUN 10 10/13/2016   CO2 32 10/13/2016   TSH 1.89 01/22/2016    Lab Results  Component Value Date   TSH 1.89 01/22/2016   Lab Results  Component Value Date   WBC 10.2 04/09/2016   HGB 14.1 04/09/2016   HCT 41.4 04/09/2016   MCV 93.0 04/09/2016   PLT 378.0 04/09/2016   Lab Results  Component Value Date   NA 140 10/13/2016   K 4.0 10/13/2016   CO2 32 10/13/2016   GLUCOSE 90 10/13/2016   BUN 10 10/13/2016   CREATININE 0.93  10/13/2016   BILITOT 0.5 10/13/2016   ALKPHOS 64 10/13/2016   AST 42 (H) 10/13/2016   ALT 73 (H) 10/13/2016   PROT 7.2 10/13/2016   ALBUMIN 4.4 10/13/2016   CALCIUM 9.9 10/13/2016   ANIONGAP 11 03/03/2015   GFR 80.31 10/13/2016   Lab Results  Component Value Date   CHOL 205 (H) 01/22/2016   Lab Results  Component Value Date   HDL 44.30 01/22/2016   No results found for: [facility] Lab Results  Component Value Date   TRIG 218.0 (H) 01/22/2016   Lab Results  Component Value Date   CHOLHDL 5 01/22/2016   No results found for: HGBA1C     Assessment & Plan:   Problem List Items Addressed This Visit    Depression with anxiety    Seroquel qhs and Venlafaxine are prescribed      Obesity    Encouraged DASH diet, decrease po intake and increase exercise as tolerated. Needs 7-8 hours of sleep nightly. Avoid trans fats, eat small, frequent meals every 4-5 hours with lean proteins, complex carbs and healthy fats. 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 She reports she feels better and continues in her support groups. 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        Other Visit Diagnoses    Walking pneumonia       Relevant Medications   albuterol (PROVENTIL HFA;VENTOLIN HFA) 108 (90 Base) MCG/ACT inhaler   valACYclovir (VALTREX) 1000 MG tablet   Acute pharyngitis, unspecified etiology       Relevant  Medications   montelukast (SINGULAIR) 10 MG tablet   Anxiety       Relevant Medications   buPROPion (WELLBUTRIN XL) 150 MG 24 hr tablet   QUEtiapine (SEROQUEL) 50 MG tablet   venlafaxine XR (EFFEXOR XR) 75 MG 24 hr capsule   buPROPion (WELLBUTRIN XL) 150 MG 24 hr tablet   Need for influenza vaccination       Relevant Orders   Flu Vaccine QUAD 6+ mos PF IM (Fluarix Quad PF) (Completed)      I have discontinued Ms. Martino's KRILL OIL PO, POTASSIUM CHLORIDE PO, Venlafaxine HCl, hydrOXYzine, and busPIRone. I have also changed her hydrochlorothiazide and metoprolol succinate. Additionally, I am having her start on venlafaxine XR and buPROPion. Lastly, I am having her maintain her ibuprofen, buPROPion, albuterol, Levonorgestrel-Ethinyl Estradiol, mometasone-formoterol, montelukast, omeprazole, QUEtiapine, ranitidine, and valACYclovir.  Meds ordered this encounter  Medications  . DISCONTD: metoprolol succinate (TOPROL-XL) 50 MG 24 hr tablet    Sig: Take 1 tablet by mouth daily.  Marland Kitchen buPROPion (WELLBUTRIN XL) 150 MG 24 hr tablet    Sig: Take 1 tablet by mouth daily.  Marland Kitchen albuterol (PROVENTIL HFA;VENTOLIN HFA) 108 (90 Base) MCG/ACT inhaler    Sig: Inhale 2 puffs into the lungs every 6 (six) hours as needed for wheezing or shortness of breath.    Dispense:  3 Inhaler    Refill:  0  . hydrochlorothiazide (HYDRODIURIL) 25 MG tablet    Sig: Take 1 tablet (25 mg total) by mouth daily.    Dispense:  90 tablet    Refill:  0  . Levonorgestrel-Ethinyl Estradiol (AMETHIA,CAMRESE) 0.15-0.03 &0.01 MG tablet    Sig: Take 1 tablet by mouth daily.    Dispense:  3 Package    Refill:  0  . metoprolol succinate (TOPROL-XL) 50 MG 24 hr tablet    Sig: Take 1 tablet (50 mg total) by mouth daily.    Dispense:  90 tablet    Refill:  0  . mometasone-formoterol (DULERA) 100-5 MCG/ACT AERO    Sig: Inhale 2 puffs into the lungs 2 (two) times daily.    Dispense:  3 Inhaler    Refill:  1  . montelukast (SINGULAIR) 10  MG tablet    Sig: Take 1 tablet (10 mg total) by mouth at bedtime.    Dispense:  90 tablet    Refill:  0  . omeprazole (PRILOSEC) 40 MG capsule    Sig: Take 1 capsule (40 mg total) by mouth daily.    Dispense:  90 capsule    Refill:  0  . QUEtiapine (SEROQUEL) 50 MG tablet    Sig: Take 1 tablet (50 mg total) by mouth at bedtime.    Dispense:  90 tablet    Refill:  0  . ranitidine (ZANTAC) 150 MG tablet    Sig: Take 1 tablet (150 mg total) by mouth 2 (two) times daily.    Dispense:  180 tablet  Refill:  0  . valACYclovir (VALTREX) 1000 MG tablet    Sig: 2 tab by mouth in AM and 2 tabs by mouth 12 hrs later at start of cold sore    Dispense:  60 tablet    Refill:  0  . venlafaxine XR (EFFEXOR XR) 75 MG 24 hr capsule    Sig: Take 2 capsules (150 mg total) by mouth daily with breakfast.    Dispense:  180 capsule    Refill:  0  . buPROPion (WELLBUTRIN XL) 150 MG 24 hr tablet    Sig: Take 1 tablet (150 mg total) by mouth daily.    Dispense:  90 tablet    Refill:  0     Penni Homans, MD

## 2016-10-19 NOTE — Assessment & Plan Note (Signed)
Seroquel qhs and Venlafaxine are prescribed

## 2016-10-19 NOTE — Assessment & Plan Note (Signed)
Improved without alcohol

## 2016-10-19 NOTE — Assessment & Plan Note (Signed)
Still elevated but should continue improve as she avoids alcohol

## 2016-10-26 ENCOUNTER — Telehealth: Payer: Self-pay | Admitting: *Deleted

## 2016-10-26 NOTE — Telephone Encounter (Signed)
Pt states she was told she could come tomorrow at 11:00. She has made arrangements to come at that time with [facility]. It is a blocked time. sedning request to SS to review and unblock and schedule as needed.

## 2016-10-26 NOTE — Telephone Encounter (Signed)
Received Disability and Leave paperwork from [place] requesting new Functional Status Survey to accompany previous STD paperwork completed, very detailed 3-page document; spoke with provider and per VO , patient needs OV to complete paperwork. Called and spoke with patient and informed her of this new paperwork and OV needed per provider, but patient states she is currently [5] hours out of town visiting family and does not know when she will return. Informed her of the due date imposed on paperwork and gave her the toll-free number to call [facility] and explain her situation; pt understood & agreed. Patient will call office when she returns to town; she will let schedulers know that she needs to be put on PCP schedule per provider, also I gave her my contact information since I am familiar with the situation and they can forward her call to me if I am available/SLS 09/10

## 2016-10-27 ENCOUNTER — Emergency Department (HOSPITAL_BASED_OUTPATIENT_CLINIC_OR_DEPARTMENT_OTHER): Payer: BLUE CROSS/BLUE SHIELD

## 2016-10-27 ENCOUNTER — Encounter (HOSPITAL_BASED_OUTPATIENT_CLINIC_OR_DEPARTMENT_OTHER): Payer: Self-pay

## 2016-10-27 ENCOUNTER — Emergency Department (HOSPITAL_BASED_OUTPATIENT_CLINIC_OR_DEPARTMENT_OTHER)
Admission: EM | Admit: 2016-10-27 | Discharge: 2016-10-27 | Disposition: A | Discharge status: Home / Self Care | Payer: BLUE CROSS/BLUE SHIELD | Attending: Emergency Medicine | Admitting: Emergency Medicine

## 2016-10-27 DIAGNOSIS — Z79899 Other long term (current) drug therapy: Secondary | ICD-10-CM | POA: Diagnosis not present

## 2016-10-27 DIAGNOSIS — K529 Noninfective gastroenteritis and colitis, unspecified: Secondary | ICD-10-CM | POA: Insufficient documentation

## 2016-10-27 DIAGNOSIS — R079 Chest pain, unspecified: Secondary | ICD-10-CM | POA: Diagnosis present

## 2016-10-27 DIAGNOSIS — I10 Essential (primary) hypertension: Secondary | ICD-10-CM | POA: Insufficient documentation

## 2016-10-27 DIAGNOSIS — F17210 Nicotine dependence, cigarettes, uncomplicated: Secondary | ICD-10-CM | POA: Diagnosis not present

## 2016-10-27 LAB — COMPREHENSIVE METABOLIC PANEL
ALT: 66 U/L — ABNORMAL HIGH (ref 14–54)
AST: 37 U/L (ref 15–41)
Albumin: 4 g/dL (ref 3.5–5.0)
Alkaline Phosphatase: 72 U/L (ref 38–126)
Anion gap: 8 (ref 5–15)
BUN: 11 mg/dL (ref 6–20)
CO2: 23 mmol/L (ref 22–32)
Calcium: 9.4 mg/dL (ref 8.9–10.3)
Chloride: 106 mmol/L (ref 101–111)
Creatinine, Ser: 0.81 mg/dL (ref 0.44–1.00)
GFR calc Af Amer: >60 mL/min (ref >60)
GFR calc non Af Amer: >60 mL/min (ref >60)
Glucose, Bld: 112 mg/dL — ABNORMAL HIGH (ref 65–99)
Potassium: 3.9 mmol/L (ref 3.5–5.1)
Sodium: 137 mmol/L (ref 135–145)
Total Bilirubin: 0.4 mg/dL (ref 0.3–1.2)
Total Protein: 7.1 g/dL (ref 6.5–8.1)

## 2016-10-27 LAB — URINALYSIS, ROUTINE W REFLEX MICROSCOPIC
Bilirubin Urine: NEGATIVE
Glucose, UA: NEGATIVE mg/dL
Ketones, ur: NEGATIVE mg/dL
Leukocytes, UA: NEGATIVE
Nitrite: NEGATIVE
Protein, ur: NEGATIVE mg/dL
Specific Gravity, Urine: 1.015 (ref 1.005–1.030)
pH: 5.5 (ref 5.0–8.0)

## 2016-10-27 LAB — PREGNANCY, URINE: Preg Test, Ur: NEGATIVE

## 2016-10-27 LAB — CBC WITH DIFFERENTIAL/PLATELET
Basophils Absolute: 0 10*3/uL (ref 0.0–0.1)
Basophils Relative: 0 %
Eosinophils Absolute: 0.3 10*3/uL (ref 0.0–0.7)
Eosinophils Relative: 2 %
HCT: 41 % (ref 36.0–46.0)
Hemoglobin: 14 g/dL (ref 12.0–15.0)
Lymphocytes Relative: 14 %
Lymphs Abs: 2 10*3/uL (ref 0.7–4.0)
MCH: 31.2 pg (ref 26.0–34.0)
MCHC: 34.1 g/dL (ref 30.0–36.0)
MCV: 91.3 fL (ref 78.0–100.0)
Monocytes Absolute: 1.3 10*3/uL — ABNORMAL HIGH (ref 0.1–1.0)
Monocytes Relative: 9 %
Neutro Abs: 10.6 10*3/uL — ABNORMAL HIGH (ref 1.7–7.7)
Neutrophils Relative %: 75 %
Platelets: 381 10*3/uL (ref 150–400)
RBC: 4.49 MIL/uL (ref 3.87–5.11)
RDW: 12.9 % (ref 11.5–15.5)
WBC: 14.1 10*3/uL — ABNORMAL HIGH (ref 4.0–10.5)

## 2016-10-27 LAB — LIPASE, BLOOD: Lipase: 30 U/L (ref 11–51)

## 2016-10-27 LAB — URINALYSIS, MICROSCOPIC (REFLEX)

## 2016-10-27 MED ORDER — KETOROLAC TROMETHAMINE 15 MG/ML IJ SOLN
15.0000 mg | Freq: Once | INTRAMUSCULAR | Status: AC
  Administered 2016-10-27: 15 mg via INTRAVENOUS
  Filled 2016-10-27: qty 1

## 2016-10-27 MED ORDER — DICYCLOMINE HCL 20 MG PO TABS: 20.0000 mg | ORAL_TABLET | Freq: Three times a day (TID) | ORAL | 0 refills | Status: DC | PRN

## 2016-10-27 MED ORDER — ONDANSETRON HCL 4 MG PO TABS: 4.0000 mg | ORAL_TABLET | Freq: Four times a day (QID) | ORAL | 0 refills | Status: DC

## 2016-10-27 MED ORDER — GI COCKTAIL ~~LOC~~
30.0000 mL | Freq: Once | ORAL | Status: AC
  Administered 2016-10-27: 30 mL via ORAL
  Filled 2016-10-27: qty 30

## 2016-10-27 MED ORDER — HYDROMORPHONE HCL 1 MG/ML IJ SOLN
1.0000 mg | Freq: Once | INTRAMUSCULAR | Status: AC
  Administered 2016-10-27: 1 mg via INTRAVENOUS
  Filled 2016-10-27: qty 1

## 2016-10-27 MED FILL — ONDANSETRON HCL 4 MG TABLET: 4 | 3 days supply | Qty: 12 | Fill #0

## 2016-10-27 MED FILL — DICYCLOMINE 20 MG TABLET: 20 | 3 days supply | Qty: 10 | Fill #0

## 2016-10-27 NOTE — ED Provider Notes (Signed)
[facility] Provider Note   CSN: [id number] Arrival date & time: 10/27/16  0931     History   Chief Complaint Chief Complaint  Patient presents with  . Chest Pain    HPI Kathryn [name] is a 21 y.o. female.  The history is provided by the patient.  Chest Pain   This is a new problem. The current episode started 6 to 12 hours ago. The problem occurs constantly. The problem has been gradually improving. Associated with: started suddenly while driving. The pain is present in the substernal region. The pain is at a severity of 8/10. The pain is moderate. The quality of the pain is described as pressure-like, sharp and stabbing (constant discomfort with intermittent sharp pain). The pain radiates to the epigastrium. Duration of episode(s) is 6 hours. The symptoms are aggravated by deep breathing. Associated symptoms include abdominal pain, back pain and nausea. Pertinent negatives include no cough, no diaphoresis, no fever, no palpitations, no shortness of breath, no sputum production and no vomiting. She has tried antacids for the symptoms. The treatment provided no relief. There are no known risk factors.  Her past medical history is significant for anxiety/panic attacks.    Past Medical History:  Diagnosis Date  . Abnormal liver function tests 02/02/2016  . ADHD 06/10/2016  . Alcohol use 02/23/2016  . Allergy   . Anxiety   . Depression   . Depression with anxiety 2014/04/14   PTSD s/p death of best friend   . Essential hypertension 02/02/2016  . GERD (gastroesophageal reflux disease)   . 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 Headache 04/09/2016  . Hyperlipidemia, mixed 02/02/2016  . Migraine   . Ovarian cyst   . PCOS (polycystic ovarian syndrome)   . Preventative health care 10/27/2015  . Thrombocytosis ([place]) 04/09/2016    Patient Active Problem List   Diagnosis Date Noted  . SOM (serous otitis media) 06/15/2016  . ADHD 06/10/2016  . Headache 04/09/2016  . Thrombocytosis  ([place]) 04/09/2016  . Alcohol use 02/23/2016  . Low back pain 02/23/2016  . Leg cramps 02/02/2016  . Essential hypertension 02/02/2016  . Hyperlipidemia, mixed 02/02/2016  . Abnormal liver function tests 02/02/2016  . Preventative health care 10/27/2015  . Dysmenorrhea 10/27/2015  . Obesity 04/15/2015  . 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 Pain in joint, ankle and foot 12/19/2014  . Insomnia 07/09/2014  . Allergic rhinitis 04-14-2014  . GERD (gastroesophageal reflux disease) 04-14-2014  . Depression with anxiety 04/14/14  . PCOS (polycystic ovarian syndrome) 2014/04/14    Past Surgical History:  Procedure Laterality Date  . ADENOIDECTOMY    . Adnoids    . TYMPANOSTOMY TUBE PLACEMENT    . TYMPANOSTOMY TUBE PLACEMENT      OB History    No data available       Home Medications    Prior to Admission medications   Medication Sig Start Date End Date Taking? Authorizing Provider  albuterol (PROVENTIL HFA;VENTOLIN HFA) 108 (90 Base) MCG/ACT inhaler Inhale 2 puffs into the lungs every 6 (six) hours as needed for wheezing or shortness of breath. 10/13/16   Mosie Lukes, MD  buPROPion (WELLBUTRIN XL) 150 MG 24 hr tablet Take 1 tablet by mouth daily. 09/18/16   [provider]  buPROPion (WELLBUTRIN XL) 150 MG 24 hr tablet Take 1 tablet (150 mg total) by mouth daily. 10/13/16   Mosie Lukes, MD  hydrochlorothiazide (HYDRODIURIL) 25 MG tablet Take 1 tablet (25 mg total) by mouth  daily. 10/13/16   Mosie Lukes, MD  ibuprofen (ADVIL,MOTRIN) 800 MG tablet Take 1 tablet (800 mg total) by mouth every 8 (eight) hours as needed. 12/26/15   Mosie Lukes, MD  Levonorgestrel-Ethinyl Estradiol (AMETHIA,CAMRESE) 0.15-0.03 &0.01 MG tablet Take 1 tablet by mouth daily. 10/13/16   Mosie Lukes, MD  metoprolol succinate (TOPROL-XL) 50 MG 24 hr tablet Take 1 tablet (50 mg total) by mouth daily. 10/13/16   Mosie Lukes, MD  mometasone-formoterol (DULERA) 100-5 MCG/ACT AERO Inhale 2 puffs  into the lungs 2 (two) times daily. 10/13/16   Mosie Lukes, MD  montelukast (SINGULAIR) 10 MG tablet Take 1 tablet (10 mg total) by mouth at bedtime. 10/13/16   Mosie Lukes, MD  omeprazole (PRILOSEC) 40 MG capsule Take 1 capsule (40 mg total) by mouth daily. 10/13/16   Mosie Lukes, MD  QUEtiapine (SEROQUEL) 50 MG tablet Take 1 tablet (50 mg total) by mouth at bedtime. 10/13/16   Mosie Lukes, MD  ranitidine (ZANTAC) 150 MG tablet Take 1 tablet (150 mg total) by mouth 2 (two) times daily. 10/13/16   Mosie Lukes, MD  valACYclovir (VALTREX) 1000 MG tablet 2 tab by mouth in AM and 2 tabs by mouth 12 hrs later at start of cold sore 10/13/16   Mosie Lukes, MD  venlafaxine XR (EFFEXOR XR) 75 MG 24 hr capsule Take 2 capsules (150 mg total) by mouth daily with breakfast. 10/13/16   Mosie Lukes, MD    Family History Family History  Problem Relation Age of Onset  . Hypertension Mother   . Heart disease Mother        CAD, stent at 37, s/p cardiac arrest with Defib  . Hyperlipidemia Mother   . Diabetes Mother        s/p gastric bypass  . Obesity Mother        s/p gastric bypass  . Hyperlipidemia Father   . Hypertension Father   . Asthma Sister   . Hyperlipidemia Brother   . Stroke Maternal Grandmother   . Thyroid disease Maternal Grandmother   . Heart disease Maternal Grandfather        MI first at 66, s/p stents and CABG  . Hyperlipidemia Maternal Grandfather   . Hypertension Maternal Grandfather   . Stroke Maternal Grandfather   . Cancer Maternal Grandfather        melanoma  . Aneurysm Maternal Grandfather        brain  . Kidney disease Maternal Grandfather        tumor  . Heart disease Paternal Grandmother   . Cancer Paternal Lucilla Edin and brain  . Heart disease Maternal Uncle   . Vision loss Maternal Uncle     Social History Social History  Substance Use Topics  . Smoking status: Current Some Day Smoker    Packs/day: 1.00    Types: Cigarettes    . Smokeless tobacco: Current User    Types: Snuff  . Alcohol use No     Comment: every 6 months     Allergies   Oxycodone; Seroquel [quetiapine fumarate]; and Trazodone and nefazodone   Review of Systems Review of Systems  Constitutional: Negative for diaphoresis and fever.  Respiratory: Negative for cough, sputum production and shortness of breath.   Cardiovascular: Positive for chest pain. Negative for palpitations.  Gastrointestinal: Positive for abdominal pain and nausea. Negative for vomiting.  Musculoskeletal: Positive for back  pain.  All other systems reviewed and are negative.    Physical Exam Updated Vital Signs BP (!) 140/93 (BP Location: Right Arm)   Pulse 77   Temp 98.3 F (36.8 C) (Oral)   Resp 16   Ht 5\' 8"  (1.727 m)   Wt 100.2 kg (221 lb)   LMP 10/26/2016   SpO2 98%   BMI 33.60 kg/m   Physical Exam  Constitutional: She is oriented to person, place, and time. She appears well-developed and well-nourished. No distress.  HENT:  Head: Normocephalic and atraumatic.  Mouth/Throat: Oropharynx is clear and moist.  Eyes: Pupils are equal, round, and reactive to light. Conjunctivae and EOM are normal.  Neck: Normal range of motion. Neck supple.  Cardiovascular: Normal rate, regular rhythm and intact distal pulses.   No murmur heard. Pulmonary/Chest: Effort normal and breath sounds normal. No respiratory distress. She has no wheezes. She has no rales.  Abdominal: Soft. She exhibits no distension. There is tenderness in the right upper quadrant, epigastric area and left upper quadrant. There is no rebound and no guarding.    Musculoskeletal: Normal range of motion. She exhibits no edema or tenderness.  Neurological: She is alert and oriented to person, place, and time.  Skin: Skin is warm and dry. No rash noted. No erythema.  Psychiatric: She has a normal mood and affect. Her behavior is normal.  Nursing note and vitals reviewed.    ED Treatments /  Results  Labs (all labs ordered are listed, but only abnormal results are displayed) Labs Reviewed  CBC WITH DIFFERENTIAL/PLATELET - Abnormal; Notable for the following:       Result Value   WBC 14.1 (*)    Neutro Abs 10.6 (*)    Monocytes Absolute 1.3 (*)    All other components within normal limits  COMPREHENSIVE METABOLIC PANEL - Abnormal; Notable for the following:    Glucose, Bld 112 (*)    ALT 66 (*)    All other components within normal limits  URINALYSIS, ROUTINE W REFLEX MICROSCOPIC - Abnormal; Notable for the following:    Hgb urine dipstick LARGE (*)    All other components within normal limits  URINALYSIS, MICROSCOPIC (REFLEX) - Abnormal; Notable for the following:    Bacteria, UA FEW (*)    Squamous Epithelial / LPF 0-5 (*)    All other components within normal limits  LIPASE, BLOOD  PREGNANCY, URINE    EKG  EKG Interpretation  Date/Time:  Tuesday October 27 2016 09:39:39 EDT Ventricular Rate:  76 PR Interval:    QRS Duration: 96 QT Interval:  376 QTC Calculation: 423 R Axis:   73 Text Interpretation:  Sinus rhythm Normal ECG No previous tracing Confirmed by Blanchie Dessert [phone]) on 10/27/2016 9:45:31 AM       Radiology Dg Chest 2 View  Result Date: 10/27/2016 CLINICAL DATA:  Chest pain EXAM: CHEST  2 VIEW COMPARISON:  03/03/2015 FINDINGS: Linear densities in the lingula compatible with subsegmental atelectasis. Right lung is clear. Heart is normal size. No effusions or acute bony abnormality. IMPRESSION: Lingular subsegmental atelectasis. Electronically Signed   By: Rolm Baptise M.D.   On: 10/27/2016 10:15   Ct Renal Stone Study  Result Date: 10/27/2016 CLINICAL DATA:  Abdominal pain. EXAM: CT ABDOMEN AND PELVIS WITHOUT CONTRAST TECHNIQUE: Multidetector CT imaging of the abdomen and pelvis was performed following the standard protocol without IV contrast. COMPARISON:  None. FINDINGS: Lower chest: 5 mm nodule identified in the right middle lobe, image  4 of series 6. Hepatobiliary: Diffuse hepatic steatosis is identified. No focal liver abnormality. The gallbladder is normal. No biliary dilatation. Pancreas: Unremarkable. No pancreatic ductal dilatation or surrounding inflammatory changes. Spleen: Normal in size without focal abnormality. Adrenals/Urinary Tract: The adrenal glands are normal. Normal appearance of the kidneys. No nephrolithiasis or hydronephrosis. Urinary bladder appears normal. Stomach/Bowel: The stomach is unremarkable. These small bowel loops have a normal caliber. Within the limitations of unenhanced technique there appears to be wall thickening and mild fat stranding involving the proximal small bowel loops suggesting enteritis. No bowel obstruction identified. The distal small bowel loops appear within normal limits. The appendix is visualized and is normal. Unremarkable appearance of the colon. Vascular/Lymphatic: No significant vascular findings are present. No enlarged abdominal or pelvic lymph nodes. Reproductive: Uterus and bilateral adnexa are unremarkable. Other: No abdominal wall hernia or abnormality. No abdominopelvic ascites. Musculoskeletal: There is no acute or significant osseous findings. IMPRESSION: 1. Exam detail diminished secondary to lack of IV and oral contrast material. 2. Within these limitations there appears to be wall thickening and mild fat stranding associated with the proximal small bowel loops suggesting enteritis. 3. Hepatic steatosis. Electronically Signed   By: Kerby Moors M.D.   On: 10/27/2016 13:07    Procedures Procedures (including critical care time)  Medications Ordered in ED Medications  gi cocktail (Maalox,Lidocaine,Donnatal) (30 mLs Oral Given 10/27/16 1041)     Initial Impression / Assessment and Plan / ED Course  I have reviewed the triage vital signs and the nursing notes.  Pertinent labs & imaging results that were available during my care of the patient were reviewed by me and  considered in my medical decision making (see chart for details).    Patient is a 21 year old female with a history of PCOS, hypertension, hyperlipidemia, prior heavy alcohol presenting with abrupt onset of upper abdominal pain, chest discomfort and left-sided pain which radiated into her back. She initially thought it was reflux because she does have a history. She took Tums with only minimal relief. The pain has continued. Nausea without vomiting and multiple episodes of diarrhea. She denies any urinary symptoms and is currently on her menses. On exam patient has no reproducible upper abdominal pain but does have some mild pain in the left lower quadrant.  She denies any respiratory symptoms and no fever. Low suspicion for cholecystitis, pancreatitis or appendicitis. Possibility for renal colic versus gastritis. Low suspicion for PE, dissection. Patient has a history of transaminitis but states she has not had any alcohol in the last several months and denies any new medications. CBC with a leukocytosis of 14,000 but otherwise normal. CMP, lipase and UA pending. Patient's chest x-ray without acute finding. GI cocktail without significant improvement. Patient given Toradol.  2:09 PM CT with enteritis which is consistent with pt's diarrhea and moving abd pain.  Patient feeling better now. Will discharge home. Final Clinical Impressions(s) / ED Diagnoses   Final diagnoses:  Enteritis    New Prescriptions New Prescriptions   DICYCLOMINE (BENTYL) 20 MG TABLET    Take 1 tablet (20 mg total) by mouth 3 (three) times daily between meals as needed for spasms.   ONDANSETRON (ZOFRAN) 4 MG TABLET    Take 1 tablet (4 mg total) by mouth every 6 (six) hours.     Blanchie Dessert, MD 10/27/16 1410

## 2016-10-27 NOTE — ED Triage Notes (Signed)
Pt states chest pain started at 2 am while driving from the [facility]. Pt states tums taken when pain started, eased pain until 5am.

## 2016-10-27 NOTE — ED Notes (Signed)
Pt states she took 800mg  ibuprofen at [facility]

## 2016-10-27 NOTE — ED Notes (Signed)
Pt is tearful, writhing around in the bed, reports pain has returned. EDP made aware and is at bedside for eval.

## 2016-10-27 NOTE — ED Notes (Signed)
Pt was instructed not to drive, and significant other was called by patient to arrange pick up from the facility. Significant other currently on the way. Pt waiting in the lobby

## 2016-10-27 NOTE — ED Notes (Signed)
Patient transported to X-ray 

## 2016-10-28 NOTE — Telephone Encounter (Signed)
Conversation  [facility])  September 21, 2016  Me  8:53 AM  Note    Spoke with Ms. Zahner on Friday about the 'single continuous time dates' on paperwork; called today and LMOM with contact name and number RE: paperwork is ready for pick-up/SLS 08/06     8:53 AM   You contacted [name]  September 17, 2016  11:38 AM  You routed this conversation to Me  Me  11:35 AM  Note    Paperwork completed as much as possible, spoke with Dr. Charlett Blake regarding F/U, [4 wks] and have pt scheduled for 10/12/16 at 11:15am; also needed date to use for end period of continuous leave beginning on 09/08/16. Explained information on paperwork to Dr. Irene Limbo medical assistant to reiterate to provider; left paperwork with Angie/SLS 08/02    September 15, 2016  12:23 PM  Wynetta Emery, [place] C routed this conversation to Harl Bowie, [place] . Me Antonieta Iba C    12:14 PM  Note    Pt came in to drop of FMLA paperwork for completion. Please call pt when ready she will come in to pick up. Placed forms in front tray to forward to provider.    Phone: [phone]

## 2016-10-31 ENCOUNTER — Encounter (HOSPITAL_BASED_OUTPATIENT_CLINIC_OR_DEPARTMENT_OTHER): Payer: Self-pay | Admitting: *Deleted

## 2016-10-31 ENCOUNTER — Emergency Department (HOSPITAL_BASED_OUTPATIENT_CLINIC_OR_DEPARTMENT_OTHER)
Admission: EM | Admit: 2016-10-31 | Discharge: 2016-10-31 | Disposition: A | Discharge status: Home / Self Care | Payer: BLUE CROSS/BLUE SHIELD | Attending: Emergency Medicine | Admitting: Emergency Medicine

## 2016-10-31 DIAGNOSIS — I10 Essential (primary) hypertension: Secondary | ICD-10-CM | POA: Diagnosis not present

## 2016-10-31 DIAGNOSIS — R1010 Upper abdominal pain, unspecified: Secondary | ICD-10-CM | POA: Diagnosis present

## 2016-10-31 DIAGNOSIS — Z79899 Other long term (current) drug therapy: Secondary | ICD-10-CM | POA: Diagnosis not present

## 2016-10-31 DIAGNOSIS — F17210 Nicotine dependence, cigarettes, uncomplicated: Secondary | ICD-10-CM | POA: Insufficient documentation

## 2016-10-31 DIAGNOSIS — R1012 Left upper quadrant pain: Secondary | ICD-10-CM | POA: Diagnosis not present

## 2016-10-31 LAB — CBC WITH DIFFERENTIAL/PLATELET
Basophils Absolute: 0 10*3/uL (ref 0.0–0.1)
Basophils Relative: 0 %
Eosinophils Absolute: 0.4 10*3/uL (ref 0.0–0.7)
Eosinophils Relative: 4 %
HCT: 40.2 % (ref 36.0–46.0)
Hemoglobin: 13.7 g/dL (ref 12.0–15.0)
Lymphocytes Relative: 29 %
Lymphs Abs: 3.1 10*3/uL (ref 0.7–4.0)
MCH: 31.1 pg (ref 26.0–34.0)
MCHC: 34.1 g/dL (ref 30.0–36.0)
MCV: 91.2 fL (ref 78.0–100.0)
Monocytes Absolute: 0.9 10*3/uL (ref 0.1–1.0)
Monocytes Relative: 8 %
Neutro Abs: 6.2 10*3/uL (ref 1.7–7.7)
Neutrophils Relative %: 59 %
Platelets: 373 10*3/uL (ref 150–400)
RBC: 4.41 MIL/uL (ref 3.87–5.11)
RDW: 13.2 % (ref 11.5–15.5)
WBC: 10.7 10*3/uL — ABNORMAL HIGH (ref 4.0–10.5)

## 2016-10-31 LAB — COMPREHENSIVE METABOLIC PANEL
ALT: 57 U/L — ABNORMAL HIGH (ref 14–54)
AST: 36 U/L (ref 15–41)
Albumin: 3.8 g/dL (ref 3.5–5.0)
Alkaline Phosphatase: 64 U/L (ref 38–126)
Anion gap: 8 (ref 5–15)
BUN: 9 mg/dL (ref 6–20)
CO2: 26 mmol/L (ref 22–32)
Calcium: 8.9 mg/dL (ref 8.9–10.3)
Chloride: 105 mmol/L (ref 101–111)
Creatinine, Ser: 0.96 mg/dL (ref 0.44–1.00)
GFR calc Af Amer: >60 mL/min (ref >60)
GFR calc non Af Amer: >60 mL/min (ref >60)
Glucose, Bld: 104 mg/dL — ABNORMAL HIGH (ref 65–99)
Potassium: 3.4 mmol/L — ABNORMAL LOW (ref 3.5–5.1)
Sodium: 139 mmol/L (ref 135–145)
Total Bilirubin: 0.4 mg/dL (ref 0.3–1.2)
Total Protein: 6.6 g/dL (ref 6.5–8.1)

## 2016-10-31 LAB — LIPASE, BLOOD: Lipase: 24 U/L (ref 11–51)

## 2016-10-31 MED ORDER — GI COCKTAIL ~~LOC~~
30.0000 mL | Freq: Once | ORAL | Status: AC
  Administered 2016-10-31: 30 mL via ORAL
  Filled 2016-10-31: qty 30

## 2016-10-31 MED ORDER — METOCLOPRAMIDE HCL 5 MG/ML IJ SOLN
10.0000 mg | Freq: Once | INTRAMUSCULAR | Status: AC
  Administered 2016-10-31: 10 mg via INTRAVENOUS
  Filled 2016-10-31: qty 2

## 2016-10-31 MED ORDER — FAMOTIDINE IN NACL 20-0.9 MG/50ML-% IV SOLN
20.0000 mg | Freq: Once | INTRAVENOUS | Status: AC
  Administered 2016-10-31: 20 mg via INTRAVENOUS
  Filled 2016-10-31: qty 50

## 2016-10-31 NOTE — ED Triage Notes (Signed)
Pt with upper abd pain x1 hour sx are similar to recent episode taking RX medication with no relief denies N/V/D

## 2016-10-31 NOTE — ED Provider Notes (Addendum)
[facility] Provider Note   CSN: [id number] Arrival date & time: 10/31/16  5009     History   Chief Complaint Chief Complaint  Patient presents with  . Abdominal Pain    HPI Kathryn [name] is a 21 y.o. female.  The history is provided by the patient.  Abdominal Pain   This is a recurrent problem. The current episode started 1 to 2 hours ago. The problem occurs constantly. Progression since onset: fluctuating. The pain is associated with an unknown factor. The pain is located in the LUQ. Quality: stabbing; "acidic" The pain is moderate. Associated symptoms include nausea. Pertinent negatives include anorexia, fever, diarrhea, hematochezia, melena, vomiting and frequency. Nothing aggravates the symptoms. Nothing relieves the symptoms. Past workup includes CT scan (on 10/27/16 that was negative; seen for the same complaint). Her past medical history is significant for GERD. Past medical history comments: chronic EtOH use; quit several weeks ago. Also uses BC/Goody powders often.    Past Medical History:  Diagnosis Date  . Abnormal liver function tests 02/02/2016  . ADHD 06/10/2016  . Alcohol use 02/23/2016  . Allergy   . Anxiety   . Depression   . Depression with anxiety 04/14/14   PTSD s/p death of best friend   . Essential hypertension 02/02/2016  . GERD (gastroesophageal reflux disease)   . 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 Headache 04/09/2016  . Hyperlipidemia, mixed 02/02/2016  . Migraine   . Ovarian cyst   . PCOS (polycystic ovarian syndrome)   . Preventative health care 10/27/2015  . Thrombocytosis ([place]) 04/09/2016    Patient Active Problem List   Diagnosis Date Noted  . SOM (serous otitis media) 06/15/2016  . ADHD 06/10/2016  . Headache 04/09/2016  . Thrombocytosis ([place]) 04/09/2016  . Alcohol use 02/23/2016  . Low back pain 02/23/2016  . Leg cramps 02/02/2016  . Essential hypertension 02/02/2016  . Hyperlipidemia, mixed 02/02/2016  . Abnormal liver function  tests 02/02/2016  . Preventative health care 10/27/2015  . Dysmenorrhea 10/27/2015  . Obesity 04/15/2015  . 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 Pain in joint, ankle and foot 12/19/2014  . Insomnia 07/09/2014  . Allergic rhinitis 04/14/2014  . GERD (gastroesophageal reflux disease) 04-14-14  . Depression with anxiety 04/14/2014  . PCOS (polycystic ovarian syndrome) Apr 14, 2014    Past Surgical History:  Procedure Laterality Date  . ADENOIDECTOMY    . Adnoids    . TYMPANOSTOMY TUBE PLACEMENT    . TYMPANOSTOMY TUBE PLACEMENT      OB History    No data available       Home Medications    Prior to Admission medications   Medication Sig Start Date End Date Taking? Authorizing Provider  albuterol (PROVENTIL HFA;VENTOLIN HFA) 108 (90 Base) MCG/ACT inhaler Inhale 2 puffs into the lungs every 6 (six) hours as needed for wheezing or shortness of breath. 10/13/16   Mosie Lukes, MD  buPROPion (WELLBUTRIN XL) 150 MG 24 hr tablet Take 1 tablet by mouth daily. 09/18/16   [provider]  buPROPion (WELLBUTRIN XL) 150 MG 24 hr tablet Take 1 tablet (150 mg total) by mouth daily. 10/13/16   Mosie Lukes, MD  dicyclomine (BENTYL) 20 MG tablet Take 1 tablet (20 mg total) by mouth 3 (three) times daily between meals as needed for spasms. 10/27/16   Blanchie Dessert, MD  hydrochlorothiazide (HYDRODIURIL) 25 MG tablet Take 1 tablet (25 mg total) by mouth daily. 10/13/16   Mosie Lukes, MD  ibuprofen (ADVIL,MOTRIN) 800 MG tablet Take 1 tablet (800 mg total) by mouth every 8 (eight) hours as needed. 12/26/15   Mosie Lukes, MD  Levonorgestrel-Ethinyl Estradiol (AMETHIA,CAMRESE) 0.15-0.03 &0.01 MG tablet Take 1 tablet by mouth daily. 10/13/16   Mosie Lukes, MD  metoprolol succinate (TOPROL-XL) 50 MG 24 hr tablet Take 1 tablet (50 mg total) by mouth daily. 10/13/16   Mosie Lukes, MD  mometasone-formoterol (DULERA) 100-5 MCG/ACT AERO Inhale 2 puffs into the lungs 2 (two) times daily.  10/13/16   Mosie Lukes, MD  montelukast (SINGULAIR) 10 MG tablet Take 1 tablet (10 mg total) by mouth at bedtime. 10/13/16   Mosie Lukes, MD  omeprazole (PRILOSEC) 40 MG capsule Take 1 capsule (40 mg total) by mouth daily. 10/13/16   Mosie Lukes, MD  ondansetron (ZOFRAN) 4 MG tablet Take 1 tablet (4 mg total) by mouth every 6 (six) hours. 10/27/16   Blanchie Dessert, MD  QUEtiapine (SEROQUEL) 50 MG tablet Take 1 tablet (50 mg total) by mouth at bedtime. 10/13/16   Mosie Lukes, MD  ranitidine (ZANTAC) 150 MG tablet Take 1 tablet (150 mg total) by mouth 2 (two) times daily. 10/13/16   Mosie Lukes, MD  valACYclovir (VALTREX) 1000 MG tablet 2 tab by mouth in AM and 2 tabs by mouth 12 hrs later at start of cold sore 10/13/16   Mosie Lukes, MD  venlafaxine XR (EFFEXOR XR) 75 MG 24 hr capsule Take 2 capsules (150 mg total) by mouth daily with breakfast. 10/13/16   Mosie Lukes, MD    Family History Family History  Problem Relation Age of Onset  . Hypertension Mother   . Heart disease Mother        CAD, stent at 71, s/p cardiac arrest with Defib  . Hyperlipidemia Mother   . Diabetes Mother        s/p gastric bypass  . Obesity Mother        s/p gastric bypass  . Hyperlipidemia Father   . Hypertension Father   . Asthma Sister   . Hyperlipidemia Brother   . Stroke Maternal Grandmother   . Thyroid disease Maternal Grandmother   . Heart disease Maternal Grandfather        MI first at 59, s/p stents and CABG  . Hyperlipidemia Maternal Grandfather   . Hypertension Maternal Grandfather   . Stroke Maternal Grandfather   . Cancer Maternal Grandfather        melanoma  . Aneurysm Maternal Grandfather        brain  . Kidney disease Maternal Grandfather        tumor  . Heart disease Paternal Grandmother   . Cancer Paternal Lucilla Edin and brain  . Heart disease Maternal Uncle   . Vision loss Maternal Uncle     Social History Social History  Substance Use Topics   . Smoking status: Current Some Day Smoker    Packs/day: 1.00    Types: Cigarettes  . Smokeless tobacco: Current User    Types: Snuff  . Alcohol use No     Comment: every 6 months     Allergies   Oxycodone; Seroquel [quetiapine fumarate]; and Trazodone and nefazodone   Review of Systems Review of Systems  Constitutional: Negative for fever.  Gastrointestinal: Positive for abdominal pain and nausea. Negative for anorexia, diarrhea, hematochezia, melena and vomiting.  Genitourinary: Negative for frequency.  All other systems  are reviewed and are negative for acute change except as noted in the HPI    Physical Exam Updated Vital Signs BP (!) 129/91   Pulse 85   Temp 98.5 F (36.9 C) (Oral)   Resp (!) 24   Ht 5\' 8"  (1.727 m)   Wt 100.2 kg (221 lb)   LMP 10/26/2016   SpO2 98%   BMI 33.60 kg/m   Physical Exam  Constitutional: She is oriented to person, place, and time. She appears well-developed and well-nourished. No distress.  HENT:  Head: Normocephalic and atraumatic.  Nose: Nose normal.  Eyes: Pupils are equal, round, and reactive to light. Conjunctivae and EOM are normal. Right eye exhibits no discharge. Left eye exhibits no discharge. No scleral icterus.  Neck: Normal range of motion. Neck supple.  Cardiovascular: Normal rate and regular rhythm.  Exam reveals no gallop and no friction rub.   No murmur heard. Pulmonary/Chest: Effort normal and breath sounds normal. No stridor. No respiratory distress. She has no rales.  Abdominal: Soft. She exhibits no distension. There is tenderness (mild discomfort) in the epigastric area and left upper quadrant. There is no rigidity, no rebound, no guarding, no CVA tenderness and negative Murphy's sign. No hernia.  Musculoskeletal: She exhibits no edema or tenderness.  Neurological: She is alert and oriented to person, place, and time.  Skin: Skin is warm and dry. No rash noted. She is not diaphoretic. No erythema.  Psychiatric:  She has a normal mood and affect.  Vitals reviewed.    ED Treatments / Results  Labs (all labs ordered are listed, but only abnormal results are displayed) Labs Reviewed  CBC WITH DIFFERENTIAL/PLATELET - Abnormal; Notable for the following:       Result Value   WBC 10.7 (*)    All other components within normal limits  COMPREHENSIVE METABOLIC PANEL - Abnormal; Notable for the following:    Potassium 3.4 (*)    Glucose, Bld 104 (*)    ALT 57 (*)    All other components within normal limits  LIPASE, BLOOD    EKG  EKG Interpretation  Date/Time:  Saturday October 31 2016 08:04:53 EDT Ventricular Rate:  89 PR Interval:    QRS Duration: 99 QT Interval:  368 QTC Calculation: 448 R Axis:   75 Text Interpretation:  Sinus rhythm Borderline short PR interval Otherwise no significant change Confirmed by Addison Lank [phone]) on 10/31/2016 8:11:16 AM       Radiology No results found.  Procedures Procedures (including critical care time)  Medications Ordered in ED Medications  gi cocktail (Maalox,Lidocaine,Donnatal) (30 mLs Oral Given 10/31/16 0730)  famotidine (PEPCID) IVPB 20 mg premix (20 mg Intravenous New Bag/Given 10/31/16 0759)  metoCLOPramide (REGLAN) injection 10 mg (10 mg Intravenous Given 10/31/16 6045)     Initial Impression / Assessment and Plan / ED Course  I have reviewed the triage vital signs and the nursing notes.  Pertinent labs & imaging results that were available during my care of the patient were reviewed by me and considered in my medical decision making (see chart for details).     Left upper quadrant pain. H/o prior heavy alcohol consumption and frequent NSAID use. Abdomen with mild left upper quadrant and epigastric discomfort. No evidence of peritonitis.  Labs closely reassuring without evidence of pancreatitis, biliary obstruction. Low suspicious for serious intra-abdominal inflammatory/infectious process. Did not feel that advanced imaging is  warranted at this time.  EKG without acute ischemic changes or evidence of pericarditis.  Low suspicion for cardiac etiology.  Likely exacerbation of patient's GERD.   Provided with a GI cocktail, Pepcid and Reglan which provided near complete resolution of her symptomatology.  The patient is safe for discharge with strict return precautions.   Final Clinical Impressions(s) / ED Diagnoses   Final diagnoses:  Left upper quadrant pain   Disposition: Discharge  Condition: Good  I have discussed the results, Dx and Tx plan with the patient who expressed understanding and agree(s) with the plan. Discharge instructions discussed at great length. The patient was given strict return precautions who verbalized understanding of the instructions. No further questions at time of discharge.    New Prescriptions   No medications on file    Follow Up: Mosie Lukes, MD [place] [address] [place] [address] [phone]         Fatima Blank, MD 10/31/16 [phone]

## 2016-10-31 NOTE — Discharge Instructions (Signed)
Please continue to refrain from using alcohol. Please discontinue the use of NSAIDs such as BC/Goody powders, Motrin, ibuprofen, Advil, Aleve, aspirin as these may exacerbate your acid reflux and can lead to peptic ulcers.

## 2016-11-02 ENCOUNTER — Ambulatory Visit (INDEPENDENT_AMBULATORY_CARE_PROVIDER_SITE_OTHER): Payer: BLUE CROSS/BLUE SHIELD | Admitting: Family Medicine

## 2016-11-02 ENCOUNTER — Ambulatory Visit (HOSPITAL_BASED_OUTPATIENT_CLINIC_OR_DEPARTMENT_OTHER)
Admission: RE | Admit: 2016-11-02 | Discharge: 2016-11-02 | Disposition: A | Discharge status: Home / Self Care | Payer: BLUE CROSS/BLUE SHIELD | Source: Ambulatory Visit | Attending: Family Medicine | Admitting: Family Medicine

## 2016-11-02 ENCOUNTER — Encounter: Payer: Self-pay | Admitting: Family Medicine

## 2016-11-02 VITALS — BP 115/72 | HR 89 | Temp 98.7°F | Ht 68.0 in | Wt 215.0 lb

## 2016-11-02 DIAGNOSIS — Z8619 Personal history of other infectious and parasitic diseases: Secondary | ICD-10-CM | POA: Diagnosis not present

## 2016-11-02 DIAGNOSIS — R112 Nausea with vomiting, unspecified: Secondary | ICD-10-CM

## 2016-11-02 DIAGNOSIS — R1013 Epigastric pain: Secondary | ICD-10-CM

## 2016-11-02 DIAGNOSIS — Z789 Other specified health status: Secondary | ICD-10-CM

## 2016-11-02 DIAGNOSIS — K760 Fatty (change of) liver, not elsewhere classified: Secondary | ICD-10-CM | POA: Diagnosis not present

## 2016-11-02 DIAGNOSIS — K219 Gastro-esophageal reflux disease without esophagitis: Secondary | ICD-10-CM | POA: Diagnosis not present

## 2016-11-02 DIAGNOSIS — G4700 Insomnia, unspecified: Secondary | ICD-10-CM

## 2016-11-02 DIAGNOSIS — Z7289 Other problems related to lifestyle: Secondary | ICD-10-CM

## 2016-11-02 DIAGNOSIS — F418 Other specified anxiety disorders: Secondary | ICD-10-CM

## 2016-11-02 MED ORDER — FAMOTIDINE 40 MG PO TABS: 40.0000 mg | ORAL_TABLET | Freq: Every day | ORAL | 3 refills | Status: DC

## 2016-11-02 MED ORDER — TRAZODONE HCL 50 MG PO TABS: 25.0000 mg | ORAL_TABLET | Freq: Every evening | ORAL | 3 refills | Status: DC | PRN

## 2016-11-02 MED ORDER — SUCRALFATE 1 GM/10ML PO SUSP: 1.0000 g | Freq: Three times a day (TID) | ORAL | 0 refills | Status: DC

## 2016-11-02 NOTE — Assessment & Plan Note (Signed)
Seroquel caused excessive sedation and fatigue the next day. She tried an oral dose of trazodone with good results. Will switch back to trazodone 25-50 mg daily at bedtime to see if that is helpful. Encouraged good sleep hygiene such as dark, quiet room. No blue/green glowing lights such as computer screens in bedroom. No alcohol or stimulants in evening. Cut down on caffeine as able. Regular exercise is helpful but not just prior to bed time.

## 2016-11-02 NOTE — Assessment & Plan Note (Signed)
Epigastric and intermittent. Family history of cholelithiasis. Will proceed with abdominal ultrasound to further investigate.

## 2016-11-02 NOTE — Assessment & Plan Note (Signed)
Continues to abstain from alcohol and alprazolam use since March 2018 after 4 weeks of inpatient rehabilitation followed by 8 weeks of outpatient care.

## 2016-11-02 NOTE — Assessment & Plan Note (Signed)
Recent flare with gastroenteritis 2 lesions on lower lip are healing nonpainful after course of valacyclovir.

## 2016-11-02 NOTE — Assessment & Plan Note (Signed)
Overall she is doing better. Spent 25 minutes of a 30 minute appointment in counseling today. She feels the Wellbutrin is helping. She is able to return back to work and her anxiety attacks have diminished.

## 2016-11-02 NOTE — Progress Notes (Signed)
Patient ID: Kathryn [name], female   DOB: [birth year], 21 y.o.   MRN: [id number]   Subjective:    Patient ID: Kathryn [name], female    DOB: Jul 11, 1995, 21 y.o.   MRN: [id number]  Chief Complaint  Patient presents with  . Advice Only  . Follow-up    FMLA and GERD   . Medication Refill    Pt states she's been to the ER twice for gerd and wants to know if she can get switched to Pepcid. Pt states it was given the ER and "worked wonders".     HPI Patient is in today for Follow-up on anxiety attacks and depression as well as ER follow-up for recurrent chest pain with nausea vomiting and diarrhea. She was seen in the ER with severe abdominal pain nausea vomiting diarrhea and diagnosed with gastroenteritis. Responded to IV Pepcid and not insignificant pain today but is requesting to replace her ranitidine with Pepcid. She is also requesting a switch from Ambien back to trazodone as a recent dose of trazodone was better tolerated. Antiemetics are too groggy the next day. She continues to struggle with anxiety and depression but does feel they're stable on her current meds. She is no longer having debilitating anxiety attacks. Denies palp/SOB/HA/congestion/fevers or GU c/o. Taking meds as prescribed  Past Medical History:  Diagnosis Date  . Abnormal liver function tests 02/02/2016  . ADHD 06/10/2016  . Alcohol use 02/23/2016  . Allergy   . Anxiety   . Depression   . Depression with anxiety Apr 26, 2014   PTSD s/p death of best friend   . Essential hypertension 02/02/2016  . GERD (gastroesophageal reflux disease)   . 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 Headache 04/09/2016  . Hyperlipidemia, mixed 02/02/2016  . Migraine   . Ovarian cyst   . PCOS (polycystic ovarian syndrome)   . Preventative health care 10/27/2015  . Thrombocytosis ([place]) 04/09/2016    Past Surgical History:  Procedure Laterality Date  . ADENOIDECTOMY    . Adnoids    . TYMPANOSTOMY TUBE PLACEMENT    . TYMPANOSTOMY TUBE PLACEMENT       Family History  Problem Relation Age of Onset  . Hypertension Mother   . Heart disease Mother        CAD, stent at 49, s/p cardiac arrest with Defib  . Hyperlipidemia Mother   . Diabetes Mother        s/p gastric bypass  . Obesity Mother        s/p gastric bypass  . Hyperlipidemia Father   . Hypertension Father   . Asthma Sister   . Hyperlipidemia Brother   . Stroke Maternal Grandmother   . Thyroid disease Maternal Grandmother   . Heart disease Maternal Grandfather        MI first at 64, s/p stents and CABG  . Hyperlipidemia Maternal Grandfather   . Hypertension Maternal Grandfather   . Stroke Maternal Grandfather   . Cancer Maternal Grandfather        melanoma  . Aneurysm Maternal Grandfather        brain  . Kidney disease Maternal Grandfather        tumor  . Heart disease Paternal Grandmother   . Cancer Paternal Lucilla Edin and brain  . Heart disease Maternal Uncle   . Vision loss Maternal Uncle     Social History   Social History  . Marital status: Single    Spouse name: N/A  .  Number of children: N/A  . Years of education: N/A   Occupational History  . Not on file.   Social History Main Topics  . Smoking status: Current Some Day Smoker    Packs/day: 1.00    Types: Cigarettes  . Smokeless tobacco: Current User    Types: Snuff  . Alcohol use No     Comment: every 6 months  . Drug use: No  . Sexual activity: Yes    Birth control/ protection: Pill     Comment: lives with partner, works for [employer], no diewtary restrictions.    Other Topics Concern  . Not on file   Social History Narrative  . No narrative on file    Outpatient Medications Prior to Visit  Medication Sig Dispense Refill  . albuterol (PROVENTIL HFA;VENTOLIN HFA) 108 (90 Base) MCG/ACT inhaler Inhale 2 puffs into the lungs every 6 (six) hours as needed for wheezing or shortness of breath. 3 Inhaler 0  . buPROPion (WELLBUTRIN XL) 150 MG 24 hr tablet Take 1 tablet  by mouth daily.    Marland Kitchen buPROPion (WELLBUTRIN XL) 150 MG 24 hr tablet Take 1 tablet (150 mg total) by mouth daily. 90 tablet 0  . dicyclomine (BENTYL) 20 MG tablet Take 1 tablet (20 mg total) by mouth 3 (three) times daily between meals as needed for spasms. 10 tablet 0  . hydrochlorothiazide (HYDRODIURIL) 25 MG tablet Take 1 tablet (25 mg total) by mouth daily. 90 tablet 0  . ibuprofen (ADVIL,MOTRIN) 800 MG tablet Take 1 tablet (800 mg total) by mouth every 8 (eight) hours as needed. 30 tablet 1  . Levonorgestrel-Ethinyl Estradiol (AMETHIA,CAMRESE) 0.15-0.03 &0.01 MG tablet Take 1 tablet by mouth daily. 3 Package 0  . metoprolol succinate (TOPROL-XL) 50 MG 24 hr tablet Take 1 tablet (50 mg total) by mouth daily. 90 tablet 0  . mometasone-formoterol (DULERA) 100-5 MCG/ACT AERO Inhale 2 puffs into the lungs 2 (two) times daily. 3 Inhaler 1  . montelukast (SINGULAIR) 10 MG tablet Take 1 tablet (10 mg total) by mouth at bedtime. 90 tablet 0  . omeprazole (PRILOSEC) 40 MG capsule Take 1 capsule (40 mg total) by mouth daily. 90 capsule 0  . ondansetron (ZOFRAN) 4 MG tablet Take 1 tablet (4 mg total) by mouth every 6 (six) hours. 12 tablet 0  . valACYclovir (VALTREX) 1000 MG tablet 2 tab by mouth in AM and 2 tabs by mouth 12 hrs later at start of cold sore 60 tablet 0  . venlafaxine XR (EFFEXOR XR) 75 MG 24 hr capsule Take 2 capsules (150 mg total) by mouth daily with breakfast. 180 capsule 0  . QUEtiapine (SEROQUEL) 50 MG tablet Take 1 tablet (50 mg total) by mouth at bedtime. 90 tablet 0  . ranitidine (ZANTAC) 150 MG tablet Take 1 tablet (150 mg total) by mouth 2 (two) times daily. 180 tablet 0   No facility-administered medications prior to visit.     Allergies  Allergen Reactions  . Oxycodone Nausea Only    Oxycontin also    Review of Systems  Constitutional: Negative for fever and malaise/fatigue.  HENT: Negative for congestion.   Eyes: Negative for blurred vision.  Respiratory: Negative for  shortness of breath.   Cardiovascular: Positive for chest pain. Negative for palpitations and leg swelling.  Gastrointestinal: Positive for diarrhea, heartburn, nausea and vomiting. Negative for abdominal pain, blood in stool and melena.  Genitourinary: Negative for dysuria and frequency.  Musculoskeletal: Negative for falls.  Skin: Negative  for rash.  Neurological: Negative for dizziness, loss of consciousness and headaches.  Endo/Heme/Allergies: Negative for environmental allergies.  Psychiatric/Behavioral: Negative for depression, hallucinations, substance abuse and suicidal ideas. The patient is nervous/anxious and has insomnia.        Objective:    Physical Exam  Constitutional: She is oriented to person, place, and time. She appears well-developed and well-nourished. No distress.  HENT:  Head: Normocephalic and atraumatic.  Nose: Nose normal.  Oropharynx erythematous  Eyes: Right eye exhibits no discharge. Left eye exhibits no discharge.  Neck: Normal range of motion. Neck supple.  Cardiovascular: Normal rate and regular rhythm.   No murmur heard. Pulmonary/Chest: Effort normal and breath sounds normal.  Abdominal: Soft. Bowel sounds are normal. There is no tenderness.  Musculoskeletal: She exhibits no edema.  Neurological: She is alert and oriented to person, place, and time.  Skin: Skin is warm and dry.  Psychiatric: She has a normal mood and affect.  Nursing note and vitals reviewed.   BP 115/72   Pulse 89   Temp 98.7 F (37.1 C) (Oral)   Ht 5\' 8"  (1.727 m)   Wt 215 lb (97.5 kg)   LMP 10/26/2016   SpO2 99%   BMI 32.69 kg/m  Wt Readings from Last 3 Encounters:  11/02/16 215 lb (97.5 kg)  10/31/16 221 lb (100.2 kg)  10/27/16 221 lb (100.2 kg)     Lab Results  Component Value Date   WBC 10.7 (H) 10/31/2016   HGB 13.7 10/31/2016   HCT 40.2 10/31/2016   PLT 373 10/31/2016   GLUCOSE 104 (H) 10/31/2016   CHOL 205 (H) 01/22/2016   TRIG 218.0 (H) 01/22/2016     HDL 44.30 01/22/2016   LDLDIRECT 138.0 01/22/2016   ALT 57 (H) 10/31/2016   AST 36 10/31/2016   NA 139 10/31/2016   K 3.4 (L) 10/31/2016   CL 105 10/31/2016   CREATININE 0.96 10/31/2016   BUN 9 10/31/2016   CO2 26 10/31/2016   TSH 1.89 01/22/2016    Lab Results  Component Value Date   TSH 1.89 01/22/2016   Lab Results  Component Value Date   WBC 10.7 (H) 10/31/2016   HGB 13.7 10/31/2016   HCT 40.2 10/31/2016   MCV 91.2 10/31/2016   PLT 373 10/31/2016   Lab Results  Component Value Date   NA 139 10/31/2016   K 3.4 (L) 10/31/2016   CO2 26 10/31/2016   GLUCOSE 104 (H) 10/31/2016   BUN 9 10/31/2016   CREATININE 0.96 10/31/2016   BILITOT 0.4 10/31/2016   ALKPHOS 64 10/31/2016   AST 36 10/31/2016   ALT 57 (H) 10/31/2016   PROT 6.6 10/31/2016   ALBUMIN 3.8 10/31/2016   CALCIUM 8.9 10/31/2016   ANIONGAP 8 10/31/2016   GFR 80.31 10/13/2016   Lab Results  Component Value Date   CHOL 205 (H) 01/22/2016   Lab Results  Component Value Date   HDL 44.30 01/22/2016   No results found for: [facility] Lab Results  Component Value Date   TRIG 218.0 (H) 01/22/2016   Lab Results  Component Value Date   CHOLHDL 5 01/22/2016   No results found for: HGBA1C     Assessment & Plan:   Problem List Items Addressed This Visit    GERD (gastroesophageal reflux disease)    2 ER visits secondary to abdominal pain and n/v with diarrhea this past week. Likely gastroenteritis and improving but she feels the Pepcid in the hospital gave her worked  better than the ranitidine she previously had so we will switch to Pepcid 40 mg daily and continue the omeprazole. She is also given Carafate liquid to use 10 mL 4 times a day with meals and daily at bedtime as needed, hopefully insurance will cover. Avoid offending foods and start fiber supplement and probiotic for the week.      Relevant Medications   famotidine (PEPCID) 40 MG tablet   sucralfate (CARAFATE) 1 GM/10ML suspension    Depression with anxiety    Overall she is doing better. Spent 25 minutes of a 30 minute appointment in counseling today. She feels the Wellbutrin is helping. She is able to return back to work and her anxiety attacks have diminished.      Abdominal pain    Epigastric and intermittent. Family history of cholelithiasis. Will proceed with abdominal ultrasound to further investigate.      Insomnia    Seroquel caused excessive sedation and fatigue the next day. She tried an oral dose of trazodone with good results. Will switch back to trazodone 25-50 mg daily at bedtime to see if that is helpful. Encouraged good sleep hygiene such as dark, quiet room. No blue/green glowing lights such as computer screens in bedroom. No alcohol or stimulants in evening. Cut down on caffeine as able. Regular exercise is helpful but not just prior to bed time.       H/O cold sores    Recent flare with gastroenteritis 2 lesions on lower lip are healing nonpainful after course of valacyclovir.      Alcohol use    Continues to abstain from alcohol and alprazolam use since March 2018 after 4 weeks of inpatient rehabilitation followed by 8 weeks of outpatient care.       Other Visit Diagnoses    Acute epigastric pain    -  Primary   Relevant Orders   US Abdomen Complete   Nausea and vomiting, intractability of vomiting not specified, unspecified vomiting type       Relevant Orders   US Abdomen Complete      I have discontinued Ms. Dion's QUEtiapine and ranitidine. I am also having her start on famotidine, sucralfate, and traZODone. Additionally, I am having her maintain her ibuprofen, buPROPion, albuterol, hydrochlorothiazide, Levonorgestrel-Ethinyl Estradiol, metoprolol succinate, mometasone-formoterol, montelukast, omeprazole, valACYclovir, venlafaxine XR, buPROPion, ondansetron, and dicyclomine.  Meds ordered this encounter  Medications  . famotidine (PEPCID) 40 MG tablet    Sig: Take 1 tablet (40 mg  total) by mouth daily.    Dispense:  30 tablet    Refill:  3  . sucralfate (CARAFATE) 1 GM/10ML suspension    Sig: Take 10 mLs (1 g total) by mouth 4 (four) times daily -  with meals and at bedtime.    Dispense:  420 mL    Refill:  0  . traZODone (DESYREL) 50 MG tablet    Sig: Take 0.5-1 tablets (25-50 mg total) by mouth at bedtime as needed for sleep.    Dispense:  30 tablet    Refill:  3     Penni Homans, MD

## 2016-11-02 NOTE — Patient Instructions (Signed)

## 2016-11-02 NOTE — Assessment & Plan Note (Signed)
2 ER visits secondary to abdominal pain and n/v with diarrhea this past week. Likely gastroenteritis and improving but she feels the Pepcid in the hospital gave her worked better than the ranitidine she previously had so we will switch to Pepcid 40 mg daily and continue the omeprazole. She is also given Carafate liquid to use 10 mL 4 times a day with meals and daily at bedtime as needed, hopefully insurance will cover. Avoid offending foods and start fiber supplement and probiotic for the week.

## 2016-12-14 ENCOUNTER — Ambulatory Visit: Payer: Self-pay | Admitting: Family Medicine

## 2016-12-17 ENCOUNTER — Other Ambulatory Visit: Payer: Self-pay | Admitting: Family Medicine

## 2017-03-21 ENCOUNTER — Encounter (HOSPITAL_BASED_OUTPATIENT_CLINIC_OR_DEPARTMENT_OTHER): Payer: Self-pay | Admitting: Emergency Medicine

## 2017-03-21 ENCOUNTER — Emergency Department (HOSPITAL_BASED_OUTPATIENT_CLINIC_OR_DEPARTMENT_OTHER)
Admission: EM | Admit: 2017-03-21 | Discharge: 2017-03-21 | Disposition: A | Discharge status: Home / Self Care | Payer: BLUE CROSS/BLUE SHIELD | Attending: Emergency Medicine | Admitting: Emergency Medicine

## 2017-03-21 ENCOUNTER — Other Ambulatory Visit: Payer: Self-pay

## 2017-03-21 DIAGNOSIS — R109 Unspecified abdominal pain: Secondary | ICD-10-CM

## 2017-03-21 DIAGNOSIS — F17210 Nicotine dependence, cigarettes, uncomplicated: Secondary | ICD-10-CM | POA: Insufficient documentation

## 2017-03-21 DIAGNOSIS — F17228 Nicotine dependence, chewing tobacco, with other nicotine-induced disorders: Secondary | ICD-10-CM | POA: Insufficient documentation

## 2017-03-21 DIAGNOSIS — R112 Nausea with vomiting, unspecified: Secondary | ICD-10-CM | POA: Insufficient documentation

## 2017-03-21 DIAGNOSIS — R1084 Generalized abdominal pain: Secondary | ICD-10-CM | POA: Insufficient documentation

## 2017-03-21 DIAGNOSIS — I10 Essential (primary) hypertension: Secondary | ICD-10-CM | POA: Insufficient documentation

## 2017-03-21 DIAGNOSIS — B9689 Other specified bacterial agents as the cause of diseases classified elsewhere: Secondary | ICD-10-CM

## 2017-03-21 DIAGNOSIS — N760 Acute vaginitis: Secondary | ICD-10-CM | POA: Insufficient documentation

## 2017-03-21 DIAGNOSIS — B373 Candidiasis of vulva and vagina: Secondary | ICD-10-CM | POA: Insufficient documentation

## 2017-03-21 DIAGNOSIS — Z79899 Other long term (current) drug therapy: Secondary | ICD-10-CM | POA: Insufficient documentation

## 2017-03-21 DIAGNOSIS — B3731 Acute candidiasis of vulva and vagina: Secondary | ICD-10-CM

## 2017-03-21 LAB — CBC WITH DIFFERENTIAL/PLATELET
Basophils Absolute: 0 10*3/uL (ref 0.0–0.1)
Basophils Relative: 0 %
Eosinophils Absolute: 0.3 10*3/uL (ref 0.0–0.7)
Eosinophils Relative: 2 %
HCT: 42 % (ref 36.0–46.0)
Hemoglobin: 14.4 g/dL (ref 12.0–15.0)
Lymphocytes Relative: 23 %
Lymphs Abs: 2.7 10*3/uL (ref 0.7–4.0)
MCH: 31.4 pg (ref 26.0–34.0)
MCHC: 34.3 g/dL (ref 30.0–36.0)
MCV: 91.7 fL (ref 78.0–100.0)
Monocytes Absolute: 1.2 10*3/uL — ABNORMAL HIGH (ref 0.1–1.0)
Monocytes Relative: 10 %
Neutro Abs: 7.7 10*3/uL (ref 1.7–7.7)
Neutrophils Relative %: 65 %
Platelets: 355 10*3/uL (ref 150–400)
RBC: 4.58 MIL/uL (ref 3.87–5.11)
RDW: 13.3 % (ref 11.5–15.5)
WBC: 11.9 10*3/uL — ABNORMAL HIGH (ref 4.0–10.5)

## 2017-03-21 LAB — COMPREHENSIVE METABOLIC PANEL
ALT: 54 U/L (ref 14–54)
AST: 42 U/L — ABNORMAL HIGH (ref 15–41)
Albumin: 4.2 g/dL (ref 3.5–5.0)
Alkaline Phosphatase: 69 U/L (ref 38–126)
Anion gap: 10 (ref 5–15)
BUN: 7 mg/dL (ref 6–20)
CO2: 25 mmol/L (ref 22–32)
Calcium: 9.4 mg/dL (ref 8.9–10.3)
Chloride: 103 mmol/L (ref 101–111)
Creatinine, Ser: 0.88 mg/dL (ref 0.44–1.00)
GFR calc Af Amer: >60 mL/min (ref >60)
GFR calc non Af Amer: >60 mL/min (ref >60)
Glucose, Bld: 83 mg/dL (ref 65–99)
Potassium: 3.6 mmol/L (ref 3.5–5.1)
Sodium: 138 mmol/L (ref 135–145)
Total Bilirubin: 1 mg/dL (ref 0.3–1.2)
Total Protein: 7.1 g/dL (ref 6.5–8.1)

## 2017-03-21 LAB — URINALYSIS, ROUTINE W REFLEX MICROSCOPIC
Glucose, UA: NEGATIVE mg/dL
Ketones, ur: 15 mg/dL — AB
Leukocytes, UA: NEGATIVE
Nitrite: NEGATIVE
Protein, ur: 30 mg/dL — AB
Specific Gravity, Urine: 1.02 (ref 1.005–1.030)
pH: 6.5 (ref 5.0–8.0)

## 2017-03-21 LAB — WET PREP, GENITAL
Sperm: NONE SEEN
Trich, Wet Prep: NONE SEEN

## 2017-03-21 LAB — URINALYSIS, MICROSCOPIC (REFLEX)

## 2017-03-21 LAB — PREGNANCY, URINE: Preg Test, Ur: NEGATIVE

## 2017-03-21 LAB — LIPASE, BLOOD: Lipase: 32 U/L (ref 11–51)

## 2017-03-21 MED ORDER — GI COCKTAIL ~~LOC~~
30.0000 mL | Freq: Once | ORAL | Status: AC
  Administered 2017-03-21: 30 mL via ORAL
  Filled 2017-03-21: qty 30

## 2017-03-21 MED ORDER — FLUCONAZOLE 150 MG PO TABS: 150.0000 mg | ORAL_TABLET | Freq: Every day | ORAL | 0 refills | Status: DC

## 2017-03-21 MED ORDER — METRONIDAZOLE 500 MG PO TABS: 500.0000 mg | ORAL_TABLET | Freq: Two times a day (BID) | ORAL | 0 refills | Status: DC

## 2017-03-21 MED ORDER — DICYCLOMINE HCL 20 MG PO TABS: 20.0000 mg | ORAL_TABLET | Freq: Three times a day (TID) | ORAL | 0 refills | Status: DC | PRN

## 2017-03-21 MED ORDER — FAMOTIDINE IN NACL 20-0.9 MG/50ML-% IV SOLN
20.0000 mg | Freq: Once | INTRAVENOUS | Status: AC
  Administered 2017-03-21: 20 mg via INTRAVENOUS
  Filled 2017-03-21: qty 50

## 2017-03-21 MED ORDER — ONDANSETRON HCL 4 MG/2ML IJ SOLN
4.0000 mg | Freq: Once | INTRAMUSCULAR | Status: AC
  Administered 2017-03-21: 4 mg via INTRAVENOUS
  Filled 2017-03-21: qty 2

## 2017-03-21 MED ORDER — ONDANSETRON 4 MG PO TBDP: 4.0000 mg | ORAL_TABLET | Freq: Three times a day (TID) | ORAL | 0 refills | Status: DC | PRN

## 2017-03-21 MED ORDER — SODIUM CHLORIDE 0.9 % IV BOLUS (SEPSIS)
1000.0000 mL | Freq: Once | INTRAVENOUS | Status: AC
  Administered 2017-03-21: 1000 mL via INTRAVENOUS

## 2017-03-21 MED ORDER — SUCRALFATE 1 GM/10ML PO SUSP: 1.0000 g | Freq: Three times a day (TID) | ORAL | 0 refills | Status: DC

## 2017-03-21 NOTE — Discharge Instructions (Signed)
Seen in the emergency today for nausea, vomiting, and abdominal pain.  You were given fluids, a GI cocktail, zofran and Pepcid with some improvement.  Lab work did not show any significant electrolyte abnormality, problem with your kidneys, problem with your liver, or anemia.  There were no significant problems with your pancreas.  Labs that we did during your pelvic exam revealed yeast and bacterial vaginosis.   We have prescribed you multiple medications today to treat your symptoms: - Diflucan- take once for the yeast infection - Metronidazole- take twice per day for 7 days for BV infection- do not drink alcohol with this medication - Bentyl- take every 8 hours as needed for abdominal cramping - Carafate- take four times per day as needed with meals - Zofran- take every 8 hours as needed for nausea/vomiting   You need to follow-up with your primary care provider or the GI specialist in the next 5 days.  Return to the emergency department for new or worsening symptoms including but not limited to worsening pain especially in the lower abdomen on the right or the left side, inability to tolerate fluids, blood in your vomit or stool, or any other concerns you may have.

## 2017-03-21 NOTE — ED Notes (Signed)
Pt reports she took a sip of water and her abd pain became worse. Also c/o nausea. EDP notified.

## 2017-03-21 NOTE — ED Triage Notes (Signed)
Patient states that she has been "pucking" for the last 2 weeks and can not hold anything down -

## 2017-03-21 NOTE — ED Provider Notes (Signed)
[facility] Provider Note   CSN: [id number] Arrival date & time: 03/21/17  1006     History   Chief Complaint Chief Complaint  Patient presents with  . Emesis    HPI Kathryn [name] is a 22 y.o. female with a hx of tobacco abuse, HTN, GERD, and PCOS who presents to the ED with intermittent nausea and vomiting x 2 weeks. Patient states she has had daily episodes of vomiting, varying from 2-10 episodes per day. States that the emesis is non-bloody and varies in quantity. States that with this she has developed generalized abdominal and esophageal discomfort- states esophagus has a burning sensation, abdomen is more of a cramping irritation which is relieved after vomiting. States that coughing triggers emesis. States she has tried Tums with some improvement. Yesterday states she had a brief more significant discomfort in the right mid to lower abdomen after getting out of the shower- this mostly resolved however persisted and is now more uncomfortable, but similar to the rest of the abdomen. No other specific alleviating/aggravating factors besides Tums.  Has hx of somewhat similar in the fall of 2018, states she has had on and off GI issues. States she has been taking her prescribed pepcid, she has been taking intermittent omeprezole- states she is suppose to take this daily, but does not. States she had some diarrhea yesterday, otherwise none, this was non bloody. Has also had some congestion, chills, cough, and sore throat recently as well.  No recent increase in NSAIDs or alcohol intake.  Denies fever, dyspnea chest pain, wheezing, blood in stool/vomit, dysuria, or urgency/frequency. Patient is currently menstruating, has baseline unchanged vaginal discharge.   HPI  Past Medical History:  Diagnosis Date  . Abnormal liver function tests 02/02/2016  . ADHD 06/10/2016  . Alcohol use 02/23/2016  . Allergy   . Anxiety   . Depression   . Depression with anxiety  04/27/14   PTSD s/p death of best friend   . Essential hypertension 02/02/2016  . GERD (gastroesophageal reflux disease)   . 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 Headache 04/09/2016  . Hyperlipidemia, mixed 02/02/2016  . Migraine   . Ovarian cyst   . PCOS (polycystic ovarian syndrome)   . Preventative health care 10/27/2015  . Thrombocytosis ([place]) 04/09/2016    Patient Active Problem List   Diagnosis Date Noted  . SOM (serous otitis media) 06/15/2016  . ADHD 06/10/2016  . Headache 04/09/2016  . Thrombocytosis ([place]) 04/09/2016  . Alcohol use 02/23/2016  . Low back pain 02/23/2016  . Leg cramps 02/02/2016  . Essential hypertension 02/02/2016  . Hyperlipidemia, mixed 02/02/2016  . Abnormal liver function tests 02/02/2016  . Preventative health care 10/27/2015  . Dysmenorrhea 10/27/2015  . Obesity 04/15/2015  . 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 Pain in joint, ankle and foot 12/19/2014  . Insomnia 07/09/2014  . Allergic rhinitis Apr 27, 2014  . GERD (gastroesophageal reflux disease) April 27, 2014  . Depression with anxiety 04-27-14  . PCOS (polycystic ovarian syndrome) 04-27-2014  . Abdominal pain April 27, 2014    Past Surgical History:  Procedure Laterality Date  . ADENOIDECTOMY    . Adnoids    . TYMPANOSTOMY TUBE PLACEMENT    . TYMPANOSTOMY TUBE PLACEMENT      OB History    No data available       Home Medications    Prior to Admission medications   Medication Sig Start Date End Date Taking? Authorizing Provider  albuterol (PROVENTIL HFA;VENTOLIN HFA) 108 (90  Base) MCG/ACT inhaler Inhale 2 puffs into the lungs every 6 (six) hours as needed for wheezing or shortness of breath. 10/13/16   Mosie Lukes, MD  buPROPion (WELLBUTRIN XL) 150 MG 24 hr tablet Take 1 tablet by mouth daily. 09/18/16   [provider]  buPROPion (WELLBUTRIN XL) 150 MG 24 hr tablet Take 1 tablet (150 mg total) by mouth daily. 10/13/16   Mosie Lukes, MD  dicyclomine (BENTYL) 20 MG tablet Take 1 tablet  (20 mg total) by mouth 3 (three) times daily between meals as needed for spasms. 10/27/16   Blanchie Dessert, MD  famotidine (PEPCID) 40 MG tablet Take 1 tablet (40 mg total) by mouth daily. 11/02/16   Mosie Lukes, MD  hydrochlorothiazide (HYDRODIURIL) 25 MG tablet Take 1 tablet (25 mg total) by mouth daily. 10/13/16   Mosie Lukes, MD  ibuprofen (ADVIL,MOTRIN) 800 MG tablet Take 1 tablet (800 mg total) by mouth every 8 (eight) hours as needed. 12/26/15   Mosie Lukes, MD  Levonorgestrel-Ethinyl Estradiol (AMETHIA,CAMRESE) 0.15-0.03 &0.01 MG tablet Take 1 tablet by mouth daily. 10/13/16   Mosie Lukes, MD  metoprolol succinate (TOPROL-XL) 50 MG 24 hr tablet Take 1 tablet (50 mg total) by mouth daily. 10/13/16   Mosie Lukes, MD  mometasone-formoterol (DULERA) 100-5 MCG/ACT AERO Inhale 2 puffs into the lungs 2 (two) times daily. 10/13/16   Mosie Lukes, MD  montelukast (SINGULAIR) 10 MG tablet Take 1 tablet (10 mg total) by mouth at bedtime. 10/13/16   Mosie Lukes, MD  omeprazole (PRILOSEC) 40 MG capsule Take 1 capsule (40 mg total) by mouth daily. 10/13/16   Mosie Lukes, MD  ondansetron (ZOFRAN) 4 MG tablet Take 1 tablet (4 mg total) by mouth every 6 (six) hours. 10/27/16   Blanchie Dessert, MD  sucralfate (CARAFATE) 1 GM/10ML suspension Take 10 mLs (1 g total) by mouth 4 (four) times daily -  with meals and at bedtime. 11/02/16   Mosie Lukes, MD  traZODone (DESYREL) 50 MG tablet Take 0.5-1 tablets (25-50 mg total) by mouth at bedtime as needed for sleep. 11/02/16   Mosie Lukes, MD  valACYclovir (VALTREX) 1000 MG tablet 2 tab by mouth in AM and 2 tabs by mouth 12 hrs later at start of cold sore 10/13/16   Mosie Lukes, MD  venlafaxine XR (EFFEXOR XR) 75 MG 24 hr capsule Take 2 capsules (150 mg total) by mouth daily with breakfast. 10/13/16   Mosie Lukes, MD  venlafaxine XR (EFFEXOR-XR) 150 MG 24 hr capsule TAKE ONE CAPSULE BY MOUTH DAILY WITH BREAKFAST WITH 75 MG CAP  12/21/16   Mosie Lukes, MD    Family History Family History  Problem Relation Age of Onset  . Hypertension Mother   . Heart disease Mother        CAD, stent at 53, s/p cardiac arrest with Defib  . Hyperlipidemia Mother   . Diabetes Mother        s/p gastric bypass  . Obesity Mother        s/p gastric bypass  . Hyperlipidemia Father   . Hypertension Father   . Asthma Sister   . Hyperlipidemia Brother   . Stroke Maternal Grandmother   . Thyroid disease Maternal Grandmother   . Heart disease Maternal Grandfather        MI first at 19, s/p stents and CABG  . Hyperlipidemia Maternal Grandfather   . Hypertension Maternal Grandfather   .  Stroke Maternal Grandfather   . Cancer Maternal Grandfather        melanoma  . Aneurysm Maternal Grandfather        brain  . Kidney disease Maternal Grandfather        tumor  . Heart disease Paternal Grandmother   . Cancer Paternal Lucilla Edin and brain  . Heart disease Maternal Uncle   . Vision loss Maternal Uncle     Social History Social History   Tobacco Use  . Smoking status: Current Some Day Smoker    Packs/day: 1.00    Types: Cigarettes  . Smokeless tobacco: Current User    Types: Snuff  Substance Use Topics  . Alcohol use: No    Alcohol/week: 0.0 oz    Comment: every 6 months  . Drug use: No     Allergies   Oxycodone   Review of Systems Review of Systems  Constitutional: Positive for chills. Negative for fever.  HENT: Positive for congestion and sore throat. Negative for ear pain.   Respiratory: Positive for cough. Negative for shortness of breath.   Cardiovascular: Negative for chest pain.  Gastrointestinal: Positive for abdominal pain, diarrhea (yesterday only, otherwise none), nausea and vomiting. Negative for blood in stool and constipation.  Genitourinary: Positive for vaginal bleeding (currently menstruating). Negative for dysuria, urgency and vaginal discharge.  All other systems reviewed and  are negative.    Physical Exam Updated Vital Signs Ht 5\' 8"  (1.727 m)   Wt 92.1 kg (203 lb)   LMP 03/19/2017   BMI 30.87 kg/m   Physical Exam  Constitutional: She appears well-developed and well-nourished. No distress.  HENT:  Head: Normocephalic and atraumatic.  Right Ear: Tympanic membrane is not perforated, not erythematous, not retracted and not bulging.  Left Ear: Tympanic membrane is not perforated, not erythematous, not retracted and not bulging.  Nose: Mucosal edema present.  Mouth/Throat: Uvula is midline and oropharynx is clear and moist. No oropharyngeal exudate or posterior oropharyngeal erythema.  Eyes: Conjunctivae are normal. Pupils are equal, round, and reactive to light. Right eye exhibits no discharge. Left eye exhibits no discharge.  Neck: Normal range of motion. Neck supple.  Cardiovascular: Normal rate and regular rhythm.  No murmur heard. Pulmonary/Chest: Breath sounds normal. No respiratory distress. She has no wheezes. She has no rales.  Abdominal: Soft. She exhibits no distension. There is tenderness (mild epigastric and lower abdomen). There is no rigidity, no rebound, no guarding, no CVA tenderness, no tenderness at McBurney's point and negative Murphy's sign.  Negative psoas, obturator, and rovsings.   Genitourinary: Pelvic exam was performed with patient supine. There is no rash, tenderness or lesion on the right labia. There is no rash, tenderness or lesion on the left labia. Cervix exhibits no motion tenderness and no friability. Right adnexum displays no mass, no tenderness and no fullness. Left adnexum displays no mass, no tenderness and no fullness.  Genitourinary Comments: There is blood in the vaginal vault and at the cervix. Small amount of bloody discharge at the cervix.   EDT Carissa present as chaperone.   Lymphadenopathy:    She has no cervical adenopathy.  Neurological: She is alert.  Skin: Skin is warm and dry. No rash noted.  Psychiatric:  She has a normal mood and affect. Her behavior is normal.  Nursing note and vitals reviewed.   ED Treatments / Results  Labs Results for orders placed or performed during the hospital encounter of  03/21/17  Wet prep, genital  Result Value Ref Range   Yeast Wet Prep HPF POC PRESENT (A) NONE SEEN   Trich, Wet Prep NONE SEEN NONE SEEN   Clue Cells Wet Prep HPF POC PRESENT (A) NONE SEEN   WBC, Wet Prep HPF POC FEW (A) NONE SEEN   Sperm NONE SEEN   Urinalysis, Routine w reflex microscopic  Result Value Ref Range   Color, Urine AMBER (A) YELLOW   APPearance CLOUDY (A) CLEAR   Specific Gravity, Urine 1.020 1.005 - 1.030   pH 6.5 5.0 - 8.0   Glucose, UA NEGATIVE NEGATIVE mg/dL   Hgb urine dipstick LARGE (A) NEGATIVE   Bilirubin Urine SMALL (A) NEGATIVE   Ketones, ur 15 (A) NEGATIVE mg/dL   Protein, ur 30 (A) NEGATIVE mg/dL   Nitrite NEGATIVE NEGATIVE   Leukocytes, UA NEGATIVE NEGATIVE  Pregnancy, urine  Result Value Ref Range   Preg Test, Ur NEGATIVE NEGATIVE  Urinalysis, Microscopic (reflex)  Result Value Ref Range   RBC / HPF TOO NUMEROUS TO COUNT 0 - 5 RBC/hpf   WBC, UA 0-5 0 - 5 WBC/hpf   Bacteria, UA MANY (A) NONE SEEN   Squamous Epithelial / LPF 6-30 (A) NONE SEEN   Mucus PRESENT   Comprehensive metabolic panel  Result Value Ref Range   Sodium 138 135 - 145 mmol/L   Potassium 3.6 3.5 - 5.1 mmol/L   Chloride 103 101 - 111 mmol/L   CO2 25 22 - 32 mmol/L   Glucose, Bld 83 65 - 99 mg/dL   BUN 7 6 - 20 mg/dL   Creatinine, Ser 0.88 0.44 - 1.00 mg/dL   Calcium 9.4 8.9 - 10.3 mg/dL   Total Protein 7.1 6.5 - 8.1 g/dL   Albumin 4.2 3.5 - 5.0 g/dL   AST 42 (H) 15 - 41 U/L   ALT 54 14 - 54 U/L   Alkaline Phosphatase 69 38 - 126 U/L   Total Bilirubin 1.0 0.3 - 1.2 mg/dL   GFR calc non Af Amer >60 >60 mL/min   GFR calc Af Amer >60 >60 mL/min   Anion gap 10 5 - 15  Lipase, blood  Result Value Ref Range   Lipase 32 11 - 51 U/L  CBC with Diff  Result Value Ref Range   WBC  11.9 (H) 4.0 - 10.5 K/uL   RBC 4.58 3.87 - 5.11 MIL/uL   Hemoglobin 14.4 12.0 - 15.0 g/dL   HCT 42.0 36.0 - 46.0 %   MCV 91.7 78.0 - 100.0 fL   MCH 31.4 26.0 - 34.0 pg   MCHC 34.3 30.0 - 36.0 g/dL   RDW 13.3 11.5 - 15.5 %   Platelets 355 150 - 400 K/uL   Neutrophils Relative % 65 %   Neutro Abs 7.7 1.7 - 7.7 K/uL   Lymphocytes Relative 23 %   Lymphs Abs 2.7 0.7 - 4.0 K/uL   Monocytes Relative 10 %   Monocytes Absolute 1.2 (H) 0.1 - 1.0 K/uL   Eosinophils Relative 2 %   Eosinophils Absolute 0.3 0.0 - 0.7 K/uL   Basophils Relative 0 %   Basophils Absolute 0.0 0.0 - 0.1 K/uL   No results found. EKG  EKG Interpretation None      Radiology No results found.  Procedures Procedures (including critical care time)  Medications Ordered in ED Medications  sodium chloride 0.9 % bolus 1,000 mL (0 mLs Intravenous Stopped 03/21/17 1324)  famotidine (PEPCID) IVPB 20 mg premix (  0 mg Intravenous Stopped 03/21/17 1308)  ondansetron (ZOFRAN) injection 4 mg (4 mg Intravenous Given 03/21/17 1322)  gi cocktail (Maalox,Lidocaine,Donnatal) (30 mLs Oral Given 03/21/17 1322)   Initial Impression / Assessment and Plan / ED Course  I have reviewed the triage vital signs and the nursing notes.  Pertinent labs & imaging results that were available during my care of the patient were reviewed by me and considered in my medical decision making (see chart for details).  Patient presents with nausea, vomiting, and generalized abdominal discomfort.  She is nontoxic-appearing, in no apparent distress, vitals are within normal limits.  On initial exam patient has mild epigastric and lower abdominal tenderness to palpation, there is no rebound, rigidity, or guarding.  Pelvic exam performed and there is no adnexal masses or tenderness, no cervical motion tenderness.  Will evaluate with CBC, CMP, lipase, prep, and gonorrhea chlamydia cultures.  Will initiate treatment with fluids and Pepcid.   13:20: RN informed me  that patient has started to complain of some nausea will order Zofran and a GI cocktail.  Lab work is grossly unremarkable- patient with white blood cell count of 11.9 there is no left shift.  Of note there is no anemia, significant electrolyte abnormality, or significant abnormality with kidney or liver function.  Lipase is within normal limits.  UA findings consistent with menstruation.  Wet prep did reveal yeast as well as findings consistent with bacterial vaginosis.  Patient states that she has intermittent problems with BV and would like to be treated for this.   14:15: RE-EVAL: Patient is resting comfortably without any complaints at this time, she states that her nausea and her pain have completely resolved.  On repeat exam her abdomen is completely nontender.    Patient is nontoxic, nonseptic appearing, in no apparent distress.  Patient's pain and other symptoms adequately managed in emergency department.  Fluid bolus given.  Labs, imaging and vitals reviewed.  Patient does not meet the SIRS or Sepsis criteria.  On repeat exam patient does not have a surgical abdomen and there are no peritoneal signs.  No indication of appendicitis, bowel obstruction, bowel perforation, cholecystitis, diverticulitis, ovarian torsion, PID or ectopic pregnancy. Patient is tolerating PO. Given brief increase in pain that patient felt more so in the right mid to lower abdomen yesterday gave patient strict return precautions specifically for ovarian torsion and appendicitis-  As discussed above no indication of these conditions at present. Will treat BV with Metronidazole and yeast with Fluconazole. Patient discharged home with additional symptomatic treatment including Zofran, Carafate, and bentyl. Discussed taking omeprazole as prescribed. Patient given strict instructions for follow-up with her primary care physician and/or gastroenterology.  I have also discussed reasons to return immediately to the ER. Provided  opportunity for questions, patient confirmed understanding and is in agreement with plan.    Findings and plan of care discussed with supervising physician Dr. Billy Fischer who agrees with plan.    Final Clinical Impressions(s) / ED Diagnoses   Final diagnoses:  Non-intractable vomiting with nausea, unspecified vomiting type  Abdominal pain, unspecified abdominal location  Bacterial vaginosis  Candida vaginitis    ED Discharge Orders        Ordered    sucralfate (CARAFATE) 1 GM/10ML suspension  3 times daily with meals & bedtime     03/21/17 1429    dicyclomine (BENTYL) 20 MG tablet  Every 8 hours PRN     03/21/17 1429    ondansetron (ZOFRAN ODT) 4 MG disintegrating  tablet  Every 8 hours PRN     03/21/17 1429    metroNIDAZOLE (FLAGYL) 500 MG tablet  2 times daily     03/21/17 1429    fluconazole (DIFLUCAN) 150 MG tablet  Daily     03/21/17 [address] [place] R, PA-C 03/21/17 1725    Gareth Morgan, MD 03/21/17 2313

## 2017-03-22 ENCOUNTER — Telehealth: Payer: Self-pay

## 2017-03-22 ENCOUNTER — Other Ambulatory Visit: Payer: Self-pay | Admitting: Family Medicine

## 2017-03-22 DIAGNOSIS — K219 Gastro-esophageal reflux disease without esophagitis: Secondary | ICD-10-CM

## 2017-03-22 LAB — GC/CHLAMYDIA PROBE AMP (~~LOC~~) NOT AT ARMC
Chlamydia: NEGATIVE
Neisseria Gonorrhea: NEGATIVE

## 2017-03-22 NOTE — Telephone Encounter (Signed)
  Reason for CRM: Pt requesting a referral to GI. Was seen in ER for GERD and was told to follow-up with GI but GI needs referral to see her. [facility], Dr. Collene Mares   Please advise I will place

## 2017-03-22 NOTE — Telephone Encounter (Signed)
Referral placed.

## 2017-04-05 ENCOUNTER — Ambulatory Visit (INDEPENDENT_AMBULATORY_CARE_PROVIDER_SITE_OTHER): Payer: Managed Care, Other (non HMO) | Admitting: Medical

## 2017-04-05 ENCOUNTER — Encounter: Payer: Self-pay | Admitting: Medical

## 2017-04-05 ENCOUNTER — Telehealth: Payer: Self-pay | Admitting: Medical

## 2017-04-05 VITALS — BP 118/77 | HR 83 | Temp 98.2°F | Resp 16 | Ht 68.0 in | Wt 198.4 lb

## 2017-04-05 DIAGNOSIS — F909 Attention-deficit hyperactivity disorder, unspecified type: Secondary | ICD-10-CM | POA: Diagnosis not present

## 2017-04-05 NOTE — Patient Instructions (Addendum)
For your history of ADD, I am going to have you signed controlled medication contract today.  You have been on stimulant type medications in the past and I do think your PCP may agree with you restarting the medication but we need to verify this since this is a controlled medication.  If she does agree to start prescribing you stimulant type medication again then you can come in for a UDS and pick up the prescription.  I will let you know on his decision when I discussed this with her.  Follow-up date to be determined   Will see if PCP is in agreement on giving you low dose of Adderall.

## 2017-04-05 NOTE — Telephone Encounter (Signed)
I am willing to restart ADD meds but I doubt she has had an ADD evaluation as an adult it is necessary to get insurance to cover the meds. Give her the number of [facility] to get the evaluation done then we can start meds.

## 2017-04-05 NOTE — Progress Notes (Signed)
Subjective:    Patient ID: Kathryn [name], female    DOB: Jan 27, 1996, 22 y.o.   MRN: [id number]  HPI  Pt in for some ADD in the past.   Pt used to be on ritalin in the past. She thinks ritalin took her personality away. Pt used to be on adderral as well in the past.  She indicates Adderall use years ago before on Ritalin.  Pt also has history of Bipolar per chart. Pt states wanted to taper off of depression medications.  Pt is on effexor 75 mg every other day. Formerly was on effexor 220 mg. Pt states pcp advised on how to taper off of medications. Pt states her mood feels better now with much lower dose.   On discussion pt thinks that she is not bipolar. On prior testing she states psychiatrist did not give her that diagnosis. Looks like pcp  diagnosised on 04/19/2017.  Pt wonders if her underlying ADD and struggling with this caused her depression in past.  In April 4,2018. Dr. Charlett Blake did rx Ritalin. Only used for 3 months. She did not like the way she felt. Pt states ADD dx as child. Pt states now as adult and in management struggling with concentration.  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  LMP- 03-29-2017. On [place].  Pt was hospitalized past year 05-2016 for severe anxiety and depression.      Review of Systems  Constitutional: Negative for chills, fatigue and fever.  Respiratory: Negative for cough, chest tightness, shortness of breath and wheezing.   Cardiovascular: Negative for chest pain and palpitations.  Musculoskeletal: Negative for back pain, joint swelling, neck pain and neck stiffness.  Skin: Negative for pallor and rash.  Neurological: Negative for dizziness, seizures, weakness, numbness and headaches.  Hematological: Negative for adenopathy. Does not bruise/bleed easily.  Psychiatric/Behavioral: Positive for decreased concentration. Negative for behavioral problems, confusion, dysphoric mood, sleep disturbance and suicidal ideas. The patient is  not nervous/anxious.    Past Medical History:  Diagnosis Date  . Abnormal liver function tests 02/02/2016  . ADHD 06/10/2016  . Alcohol use 02/23/2016  . Allergy   . Anxiety   . Depression   . Depression with anxiety 2014/04/28   PTSD s/p death of best friend   . Essential hypertension 02/02/2016  . GERD (gastroesophageal reflux disease)   . 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 Headache 04/09/2016  . Hyperlipidemia, mixed 02/02/2016  . Migraine   . Ovarian cyst   . PCOS (polycystic ovarian syndrome)   . Preventative health care 10/27/2015  . Thrombocytosis ([place]) 04/09/2016     Social History   Socioeconomic History  . Marital status: Single    Spouse name: Not on file  . Number of children: Not on file  . Years of education: Not on file  . Highest education level: Not on file  Social Needs  . Financial resource strain: Not on file  . Food insecurity - worry: Not on file  . Food insecurity - inability: Not on file  . Transportation needs - medical: Not on file  . Transportation needs - non-medical: Not on file  Occupational History  . Not on file  Tobacco Use  . Smoking status: Current Some Day Smoker    Packs/day: 1.00    Types: Cigarettes  . Smokeless tobacco: Current User    Types: Snuff  Substance and Sexual Activity  . Alcohol use: No    Alcohol/week: 0.0 oz    Comment: every  6 months  . Drug use: No  . Sexual activity: Yes    Birth control/protection: Pill    Comment: lives with partner, works for [employer], no diewtary restrictions.   Other Topics Concern  . Not on file  Social History Narrative  . Not on file    Past Surgical History:  Procedure Laterality Date  . ADENOIDECTOMY    . Adnoids    . TYMPANOSTOMY TUBE PLACEMENT    . TYMPANOSTOMY TUBE PLACEMENT      Family History  Problem Relation Age of Onset  . Hypertension Mother   . Heart disease Mother        CAD, stent at 15, s/p cardiac arrest with Defib  . Hyperlipidemia Mother   . Diabetes  Mother        s/p gastric bypass  . Obesity Mother        s/p gastric bypass  . Hyperlipidemia Father   . Hypertension Father   . Asthma Sister   . Hyperlipidemia Brother   . Stroke Maternal Grandmother   . Thyroid disease Maternal Grandmother   . Heart disease Maternal Grandfather        MI first at 82, s/p stents and CABG  . Hyperlipidemia Maternal Grandfather   . Hypertension Maternal Grandfather   . Stroke Maternal Grandfather   . Cancer Maternal Grandfather        melanoma  . Aneurysm Maternal Grandfather        brain  . Kidney disease Maternal Grandfather        tumor  . Heart disease Paternal Grandmother   . Cancer Paternal Lucilla Edin and brain  . Heart disease Maternal Uncle   . Vision loss Maternal Uncle     Allergies  Allergen Reactions  . Oxycodone Nausea Only    Oxycontin also    Current Outpatient Medications on File Prior to Visit  Medication Sig Dispense Refill  . albuterol (PROVENTIL HFA;VENTOLIN HFA) 108 (90 Base) MCG/ACT inhaler Inhale 2 puffs into the lungs every 6 (six) hours as needed for wheezing or shortness of breath. 3 Inhaler 0  . dicyclomine (BENTYL) 20 MG tablet Take 1 tablet (20 mg total) by mouth every 8 (eight) hours as needed for spasms. 30 tablet 0  . famotidine (PEPCID) 40 MG tablet Take 1 tablet (40 mg total) by mouth daily. 30 tablet 3  . ibuprofen (ADVIL,MOTRIN) 800 MG tablet Take 1 tablet (800 mg total) by mouth every 8 (eight) hours as needed. 30 tablet 1  . Levonorgestrel-Ethinyl Estradiol (AMETHIA,CAMRESE) 0.15-0.03 &0.01 MG tablet Take 1 tablet by mouth daily. 3 Package 0  . mometasone-formoterol (DULERA) 100-5 MCG/ACT AERO Inhale 2 puffs into the lungs 2 (two) times daily. 3 Inhaler 1  . montelukast (SINGULAIR) 10 MG tablet Take 1 tablet (10 mg total) by mouth at bedtime. 90 tablet 0  . omeprazole (PRILOSEC) 40 MG capsule Take 1 capsule (40 mg total) by mouth daily. 90 capsule 0  . sucralfate (CARAFATE) 1 GM/10ML  suspension Take 10 mLs (1 g total) by mouth 4 (four) times daily -  with meals and at bedtime. 420 mL 0  . traZODone (DESYREL) 50 MG tablet Take 0.5-1 tablets (25-50 mg total) by mouth at bedtime as needed for sleep. 30 tablet 3  . valACYclovir (VALTREX) 1000 MG tablet 2 tab by mouth in AM and 2 tabs by mouth 12 hrs later at start of cold sore 60 tablet 0  .  venlafaxine XR (EFFEXOR XR) 75 MG 24 hr capsule Take 2 capsules (150 mg total) by mouth daily with breakfast. 180 capsule 0  . venlafaxine XR (EFFEXOR-XR) 150 MG 24 hr capsule TAKE ONE CAPSULE BY MOUTH DAILY WITH BREAKFAST WITH 75 MG CAP 30 capsule 0  . hydrochlorothiazide (HYDRODIURIL) 25 MG tablet Take 1 tablet (25 mg total) by mouth daily. (Patient not taking: Reported on 04/05/2017) 90 tablet 0  . metoprolol succinate (TOPROL-XL) 50 MG 24 hr tablet Take 1 tablet (50 mg total) by mouth daily. (Patient not taking: Reported on 04/05/2017) 90 tablet 0   No current facility-administered medications on file prior to visit.     BP 118/77   Pulse 83   Temp 98.2 F (36.8 C) (Oral)   Resp 16   Ht 5\' 8"  (1.727 m)   Wt 198 lb 6.4 oz (90 kg)   LMP 03/19/2017   SpO2 100%   BMI 30.17 kg/m      Objective:   Physical Exam  General Mental Status- Alert. General Appearance- Not in acute distress.   Skin General: Color- Normal Color. Moisture- Normal Moisture.  Neck Carotid Arteries- Normal color. Moisture- Normal Moisture. No carotid bruits. No JVD.  Chest and Lung Exam Auscultation: Breath Sounds:-Normal.  Cardiovascular Auscultation:Rythm- Regular. Murmurs & Other Heart Sounds:Auscultation of the heart reveals- No Murmurs.  Abdomen Inspection:-Inspeection Normal. Palpation/Percussion:Note:No mass. Palpation and Percussion of the abdomen reveal- Non Tender, Non Distended + BS, no rebound or guarding.    Neurologic Cranial Nerve exam:- CN III-XII intact(No nystagmus), symmetric smile. Drift Test:- No drift. Romberg Exam:-  Negative.  Heal to Toe Gait exam:-Normal. Finger to Nose:- Normal/Intact Strength:- 5/5 equal and symmetric strength both upper and lower extremities.      Assessment & Plan:  For your history of ADD, I am going to have you signed controlled medication contract today.  You have been on stimulant type medications in the past and I do think your PCP may agree with you restarting the medication but we need to verify this since this is a controlled medication.  If she does agree to start prescribing you stimulant type medication again then you can come in for a UDS and pick up the prescription.  I will let you know on his decision when I discussed this with her.  Follow-up date to be determined   Will see if PCP is in agreement with giving you low dose of Adderall  [name], PA-C

## 2017-04-05 NOTE — Telephone Encounter (Signed)
Dr. Charlett Blake,  I saw Kathryn [name] today and she wants to know if she can restart ADD medication.  I sent you the note but she has been on Ritalin in the past and did not like the way she felt with that.  She wants to know if she could try Adderall or other type.  I did get her to sign a contract in the event you agree that she can be on this.  There is some question about her psychiatric diagnosis history.  Currently her mood is stable.  She states she feels good with only Effexor 75 mg every third day.  Sent you a copy of the note.  Thanks,  [name], PA-C

## 2017-04-06 ENCOUNTER — Telehealth: Payer: Self-pay | Admitting: Medical

## 2017-04-06 ENCOUNTER — Encounter: Payer: Self-pay | Admitting: Family Medicine

## 2017-04-06 DIAGNOSIS — F909 Attention-deficit hyperactivity disorder, unspecified type: Secondary | ICD-10-CM

## 2017-04-06 NOTE — Telephone Encounter (Signed)
Call patient and give her the number to the [facility] [facility].  Dr. Charlett Blake want her to have an ADD evaluation before medications are prescribed.  Would you give patient the number so she can call them.  If you could help facilitate the referral that would be good as well.

## 2017-04-06 NOTE — Telephone Encounter (Signed)
I put a referral into behavioral health but would you give her the phone number so she can call and set up the ADD evaluation.

## 2017-04-08 NOTE — Telephone Encounter (Signed)
Dr. Charlett Blake,  I wanted to point out that patient told me that she only used Ritalin for 3 months and did not like the way she felt.  She had asked me on the day I saw her if we would consider Adderall instead.  Just wanted to mention that before the prescription was written.  Thanks,  Percell Miller

## 2017-04-08 NOTE — Telephone Encounter (Signed)
She does not have to redo the testing if it has been that recent I was thinking it was longer. She can have Ritalin 5 mg tabs, 1 tab po bid and then come in in about 3 weeks to discuss side effects and/or response so we can adjust dosing.

## 2017-04-09 ENCOUNTER — Encounter: Payer: Self-pay | Admitting: Family Medicine

## 2017-04-09 NOTE — Telephone Encounter (Signed)
Thanks, I am willing to try Adderall instead of Ritalin 10 mg tab, 1 tab po daily and she still needs an appt in 2-4 weeks

## 2017-04-12 MED ORDER — AMPHETAMINE-DEXTROAMPHETAMINE 10 MG PO TABS: 10.0000 mg | ORAL_TABLET | Freq: Every day | ORAL | 0 refills | Status: DC

## 2017-04-12 NOTE — Telephone Encounter (Signed)
Patient made aware of rx ready for pick up

## 2017-04-12 NOTE — Addendum Note (Signed)
Addended by: Magdalene Molly A on: 04/12/2017 10:10 AM   Modules accepted: Orders

## 2017-05-04 ENCOUNTER — Ambulatory Visit (INDEPENDENT_AMBULATORY_CARE_PROVIDER_SITE_OTHER): Payer: Managed Care, Other (non HMO) | Admitting: Family Medicine

## 2017-05-04 ENCOUNTER — Encounter: Payer: Self-pay | Admitting: Family Medicine

## 2017-05-04 ENCOUNTER — Other Ambulatory Visit (HOSPITAL_COMMUNITY)
Admission: RE | Admit: 2017-05-04 | Discharge: 2017-05-04 | Disposition: A | Discharge status: Home / Self Care | Payer: Managed Care, Other (non HMO) | Source: Ambulatory Visit | Attending: Family Medicine | Admitting: Family Medicine

## 2017-05-04 VITALS — BP 116/80 | HR 71 | Temp 98.0°F | Resp 18 | Wt 194.8 lb

## 2017-05-04 DIAGNOSIS — N761 Subacute and chronic vaginitis: Secondary | ICD-10-CM | POA: Diagnosis not present

## 2017-05-04 DIAGNOSIS — B9689 Other specified bacterial agents as the cause of diseases classified elsewhere: Secondary | ICD-10-CM | POA: Diagnosis not present

## 2017-05-04 DIAGNOSIS — Z0000 Encounter for general adult medical examination without abnormal findings: Secondary | ICD-10-CM

## 2017-05-04 DIAGNOSIS — I10 Essential (primary) hypertension: Secondary | ICD-10-CM | POA: Diagnosis not present

## 2017-05-04 DIAGNOSIS — J029 Acute pharyngitis, unspecified: Secondary | ICD-10-CM

## 2017-05-04 DIAGNOSIS — F909 Attention-deficit hyperactivity disorder, unspecified type: Secondary | ICD-10-CM | POA: Diagnosis not present

## 2017-05-04 DIAGNOSIS — F418 Other specified anxiety disorders: Secondary | ICD-10-CM | POA: Diagnosis not present

## 2017-05-04 DIAGNOSIS — G4700 Insomnia, unspecified: Secondary | ICD-10-CM

## 2017-05-04 DIAGNOSIS — Z9109 Other allergy status, other than to drugs and biological substances: Secondary | ICD-10-CM

## 2017-05-04 DIAGNOSIS — E782 Mixed hyperlipidemia: Secondary | ICD-10-CM | POA: Diagnosis not present

## 2017-05-04 DIAGNOSIS — R030 Elevated blood-pressure reading, without diagnosis of hypertension: Secondary | ICD-10-CM | POA: Insufficient documentation

## 2017-05-04 DIAGNOSIS — E663 Overweight: Secondary | ICD-10-CM

## 2017-05-04 MED ORDER — MONTELUKAST SODIUM 10 MG PO TABS: 10.0000 mg | ORAL_TABLET | Freq: Every day | ORAL | 1 refills | Status: DC

## 2017-05-04 MED ORDER — VENLAFAXINE HCL ER 75 MG PO CP24: 75.0000 mg | ORAL_CAPSULE | Freq: Every day | ORAL | 3 refills | Status: DC

## 2017-05-04 MED ORDER — AMPHETAMINE-DEXTROAMPHET ER 20 MG PO CP24: 20.0000 mg | ORAL_CAPSULE | Freq: Every day | ORAL | 0 refills | Status: DC

## 2017-05-04 NOTE — Assessment & Plan Note (Signed)
Encouraged good sleep hygiene such as dark, quiet room. No blue/green glowing lights such as computer screens in bedroom. No alcohol or stimulants in evening. Cut down on caffeine as able. Regular exercise is helpful but not just prior to bed time.  

## 2017-05-04 NOTE — Patient Instructions (Addendum)
Preventive Care 18-39 Years, Female Preventive care refers to lifestyle choices and visits with your health care provider that can promote health and wellness. What does preventive care include?  A yearly physical exam. This is also called an annual well check.  Dental exams once or twice a year.  Routine eye exams. Ask your health care provider how often you should have your eyes checked.  Personal lifestyle choices, including: ? Daily care of your teeth and gums. ? Regular physical activity. ? Eating a healthy diet. ? Avoiding tobacco and drug use. ? Limiting alcohol use. ? Practicing safe sex. ? Taking vitamin and mineral supplements as recommended by your health care provider. What happens during an annual well check? The services and screenings done by your health care provider during your annual well check will depend on your age, overall health, lifestyle risk factors, and family history of disease. Counseling Your health care provider may ask you questions about your:  Alcohol use.  Tobacco use.  Drug use.  Emotional well-being.  Home and relationship well-being.  Sexual activity.  Eating habits.  Work and work Statistician.  Method of birth control.  Menstrual cycle.  Pregnancy history.  Screening You may have the following tests or measurements:  Height, weight, and BMI.  Diabetes screening. This is done by checking your blood sugar (glucose) after you have not eaten for a while (fasting).  Blood pressure.  Lipid and cholesterol levels. These may be checked every 5 years starting at age 66.  Skin check.  Hepatitis C blood test.  Hepatitis B blood test.  Sexually transmitted disease (STD) testing.  BRCA-related cancer screening. This may be done if you have a family history of breast, ovarian, tubal, or peritoneal cancers.  Pelvic exam and Pap test. This may be done every 3 years starting at age 40. Starting at age 59, this may be done every 5  years if you have a Pap test in combination with an HPV test.  Discuss your test results, treatment options, and if necessary, the need for more tests with your health care provider. Vaccines Your health care provider may recommend certain vaccines, such as:  Influenza vaccine. This is recommended every year.  Tetanus, diphtheria, and acellular pertussis (Tdap, Td) vaccine. You may need a Td booster every 10 years.  Varicella vaccine. You may need this if you have not been vaccinated.  HPV vaccine. If you are 69 or younger, you may need three doses over 6 months.  Measles, mumps, and rubella (MMR) vaccine. You may need at least one dose of MMR. You may also need a second dose.  Pneumococcal 13-valent conjugate (PCV13) vaccine. You may need this if you have certain conditions and were not previously vaccinated.  Pneumococcal polysaccharide (PPSV23) vaccine. You may need one or two doses if you smoke cigarettes or if you have certain conditions.  Meningococcal vaccine. One dose is recommended if you are age 27-21 years and a first-year college student living in a residence hall, or if you have one of several medical conditions. You may also need additional booster doses.  Hepatitis A vaccine. You may need this if you have certain conditions or if you travel or work in places where you may be exposed to hepatitis A.  Hepatitis B vaccine. You may need this if you have certain conditions or if you travel or work in places where you may be exposed to hepatitis B.  Haemophilus influenzae type b (Hib) vaccine. You may need this if  you have certain risk factors.  Talk to your health care provider about which screenings and vaccines you need and how often you need them. This information is not intended to replace advice given to you by your health care provider. Make sure you discuss any questions you have with your health care provider. Document Released: 03/31/2001 Document Revised: 10/23/2015  Document Reviewed: 12/04/2014 Elsevier Interactive Patient Education  [employer].

## 2017-05-04 NOTE — Assessment & Plan Note (Signed)
Well controlled, no changes to meds. Encouraged heart healthy diet such as the DASH diet and exercise as tolerated.  °

## 2017-05-04 NOTE — Progress Notes (Signed)
Subjective:  CMA served as scribe for visit  Patient ID: Kathryn [name], female    DOB: 08-01-1995, 22 y.o.   MRN: [id number]  No chief complaint on file.   HPI  Patient is in today for annual preventative exam and follow up on chronic medical concerns including attention deficit disorder, anxiety, hypertension and more. She is still not drinking alcohol. She denies any recent febrile illness or hospitalizations. She is noting the Adderall helps her ADD but only for a short time then when it wears off her anxiety kicks up. She denies anhedonia or depression. Denies CP/palp/SOB/HA/congestion/fevers/GI or GU c/o. Taking meds as prescribed  Patient Care Team: Mosie Lukes, MD as PCP - General (Family Medicine)   Past Medical History:  Diagnosis Date  . Abnormal liver function tests 02/02/2016  . ADHD 06/10/2016  . Alcohol use 02/23/2016  . Allergy   . Anxiety   . Depression   . Depression with anxiety May 10, 2014   PTSD s/p death of best friend   . Essential hypertension 02/02/2016  . GERD (gastroesophageal reflux disease)   . 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 Headache 04/09/2016  . Hyperlipidemia, mixed 02/02/2016  . Migraine   . Ovarian cyst   . PCOS (polycystic ovarian syndrome)   . Preventative health care 10/27/2015  . Thrombocytosis ([place]) 04/09/2016    Past Surgical History:  Procedure Laterality Date  . ADENOIDECTOMY    . Adnoids    . TYMPANOSTOMY TUBE PLACEMENT    . TYMPANOSTOMY TUBE PLACEMENT      Family History  Problem Relation Age of Onset  . Hypertension Mother   . Heart disease Mother        CAD, stent at 64, s/p cardiac arrest with Defib  . Hyperlipidemia Mother   . Diabetes Mother        s/p gastric bypass  . Obesity Mother        s/p gastric bypass  . Hyperlipidemia Father   . Hypertension Father   . Asthma Sister   . Hyperlipidemia Brother   . Stroke Maternal Grandmother   . Thyroid disease Maternal Grandmother   . Heart disease Maternal Grandfather        MI first at 75, s/p stents and CABG  . Hyperlipidemia Maternal Grandfather   . Hypertension Maternal Grandfather   . Stroke Maternal Grandfather   . Cancer Maternal Grandfather        melanoma  . Aneurysm Maternal Grandfather        brain  . Kidney disease Maternal Grandfather        tumor  . Heart disease Paternal Grandmother   . Cancer Paternal Lucilla Edin and brain  . Heart disease Maternal Uncle   . Vision loss Maternal Uncle     Social History   Socioeconomic History  . Marital status: Single    Spouse name: Not on file  . Number of children: Not on file  . Years of education: Not on file  . Highest education level: Not on file  Social Needs  . Financial resource strain: Not on file  . Food insecurity - worry: Not on file  . Food insecurity - inability: Not on file  . Transportation needs - medical: Not on file  . Transportation needs - non-medical: Not on file  Occupational History  . Not on file  Tobacco Use  . Smoking status: Current Some Day Smoker    Packs/day: 1.00  Types: Cigarettes  . Smokeless tobacco: Current User    Types: Snuff  Substance and Sexual Activity  . Alcohol use: No    Alcohol/week: 0.0 oz    Comment: every 6 months  . Drug use: No  . Sexual activity: Yes    Birth control/protection: Pill    Comment: lives with partner, works for [employer], no diewtary restrictions.   Other Topics Concern  . Not on file  Social History Narrative  . Not on file    Outpatient Medications Prior to Visit  Medication Sig Dispense Refill  . albuterol (PROVENTIL HFA;VENTOLIN HFA) 108 (90 Base) MCG/ACT inhaler Inhale 2 puffs into the lungs every 6 (six) hours as needed for wheezing or shortness of breath. 3 Inhaler 0  . Dexlansoprazole 30 MG capsule Take 30 mg by mouth daily.    . Levonorgestrel-Ethinyl Estradiol (AMETHIA,CAMRESE) 0.15-0.03 &0.01 MG tablet Take 1 tablet by mouth daily. 3 Package 0  . mometasone-formoterol (DULERA)  100-5 MCG/ACT AERO Inhale 2 puffs into the lungs 2 (two) times daily. 3 Inhaler 1  . valACYclovir (VALTREX) 1000 MG tablet 2 tab by mouth in AM and 2 tabs by mouth 12 hrs later at start of cold sore 60 tablet 0  . amphetamine-dextroamphetamine (ADDERALL) 10 MG tablet Take 1 tablet (10 mg total) by mouth daily. 30 tablet 0  . montelukast (SINGULAIR) 10 MG tablet Take 1 tablet (10 mg total) by mouth at bedtime. 90 tablet 0  . dicyclomine (BENTYL) 20 MG tablet Take 1 tablet (20 mg total) by mouth every 8 (eight) hours as needed for spasms. 30 tablet 0  . famotidine (PEPCID) 40 MG tablet Take 1 tablet (40 mg total) by mouth daily. 30 tablet 3  . hydrochlorothiazide (HYDRODIURIL) 25 MG tablet Take 1 tablet (25 mg total) by mouth daily. (Patient not taking: Reported on 04/05/2017) 90 tablet 0  . ibuprofen (ADVIL,MOTRIN) 800 MG tablet Take 1 tablet (800 mg total) by mouth every 8 (eight) hours as needed. 30 tablet 1  . metoprolol succinate (TOPROL-XL) 50 MG 24 hr tablet Take 1 tablet (50 mg total) by mouth daily. (Patient not taking: Reported on 04/05/2017) 90 tablet 0  . omeprazole (PRILOSEC) 40 MG capsule Take 1 capsule (40 mg total) by mouth daily. 90 capsule 0  . sucralfate (CARAFATE) 1 GM/10ML suspension Take 10 mLs (1 g total) by mouth 4 (four) times daily -  with meals and at bedtime. 420 mL 0  . traZODone (DESYREL) 50 MG tablet Take 0.5-1 tablets (25-50 mg total) by mouth at bedtime as needed for sleep. 30 tablet 3  . venlafaxine XR (EFFEXOR XR) 75 MG 24 hr capsule Take 2 capsules (150 mg total) by mouth daily with breakfast. 180 capsule 0  . venlafaxine XR (EFFEXOR-XR) 150 MG 24 hr capsule TAKE ONE CAPSULE BY MOUTH DAILY WITH BREAKFAST WITH 75 MG CAP 30 capsule 0   No facility-administered medications prior to visit.     Allergies  Allergen Reactions  . Oxycodone Nausea Only    Oxycontin also    Review of Systems  Constitutional: Negative for chills, fever and malaise/fatigue.  HENT:  Positive for congestion. Negative for hearing loss.   Eyes: Negative for discharge.  Respiratory: Positive for sputum production. Negative for cough and shortness of breath.   Cardiovascular: Negative for chest pain, palpitations and leg swelling.  Gastrointestinal: Negative for abdominal pain, blood in stool, constipation, diarrhea, heartburn, nausea and vomiting.  Genitourinary: Negative for dysuria, frequency, hematuria and urgency.  Musculoskeletal: Negative for back pain, falls and myalgias.  Skin: Negative for rash.  Neurological: Negative for dizziness, sensory change, loss of consciousness, weakness and headaches.  Endo/Heme/Allergies: Negative for environmental allergies. Does not bruise/bleed easily.  Psychiatric/Behavioral: Negative for depression and suicidal ideas. The patient is nervous/anxious and has insomnia.        Objective:    Physical Exam  Constitutional: She is oriented to person, place, and time. She appears well-developed and well-nourished. No distress.  HENT:  Head: Normocephalic and atraumatic.  Eyes: Conjunctivae are normal.  Neck: Neck supple. No thyromegaly present.  Cardiovascular: Normal rate, regular rhythm and normal heart sounds.  No murmur heard. Pulmonary/Chest: Effort normal and breath sounds normal. No respiratory distress.  Abdominal: Soft. Bowel sounds are normal. She exhibits no distension and no mass. There is no tenderness.  Musculoskeletal: She exhibits no edema.  Lymphadenopathy:    She has no cervical adenopathy.  Neurological: She is alert and oriented to person, place, and time.  Skin: Skin is warm and dry.  Psychiatric: She has a normal mood and affect. Her behavior is normal.    BP 116/80 (BP Location: Left Arm, Patient Position: Sitting, Cuff Size: Normal)   Pulse 71   Temp 98 F (36.7 C) (Oral)   Resp 18   Wt 194 lb 12.8 oz (88.4 kg)   SpO2 98%   BMI 29.62 kg/m  Wt Readings from Last 3 Encounters:  05/04/17 194 lb 12.8  oz (88.4 kg)  04/05/17 198 lb 6.4 oz (90 kg)  03/21/17 203 lb (92.1 kg)   BP Readings from Last 3 Encounters:  05/04/17 116/80  04/05/17 118/77  03/21/17 130/90     Immunization History  Administered Date(s) Administered  . Influenza,inj,Quad PF,6+ Mos 10/13/2016  . Influenza-Unspecified 12/01/2014, 12/30/2015    Health Maintenance  Topic Date Due  . HIV Screening  01/11/2010  . TETANUS/TDAP  01/11/2014  . PAP SMEAR  01/12/2016  . CHLAMYDIA SCREENING  03/21/2018  . INFLUENZA VACCINE  Completed    Lab Results  Component Value Date   WBC 10.3 05/05/2017   HGB 13.9 05/05/2017   HCT 41.3 05/05/2017   PLT 374.0 05/05/2017   GLUCOSE 104 (H) 05/05/2017   CHOL 218 (H) 05/05/2017   TRIG 107.0 05/05/2017   HDL 42.00 05/05/2017   LDLDIRECT 138.0 01/22/2016   LDLCALC 155 (H) 05/05/2017   ALT 28 05/05/2017   AST 22 05/05/2017   NA 146 (H) 05/05/2017   K 4.0 05/05/2017   CL 107 05/05/2017   CREATININE 0.90 05/05/2017   BUN 7 05/05/2017   CO2 26 05/05/2017   TSH 2.49 05/05/2017    Lab Results  Component Value Date   TSH 2.49 05/05/2017   Lab Results  Component Value Date   WBC 10.3 05/05/2017   HGB 13.9 05/05/2017   HCT 41.3 05/05/2017   MCV 93.7 05/05/2017   PLT 374.0 05/05/2017   Lab Results  Component Value Date   NA 146 (H) 05/05/2017   K 4.0 05/05/2017   CO2 26 05/05/2017   GLUCOSE 104 (H) 05/05/2017   BUN 7 05/05/2017   CREATININE 0.90 05/05/2017   BILITOT 0.4 05/05/2017   ALKPHOS 68 05/05/2017   AST 22 05/05/2017   ALT 28 05/05/2017   PROT 7.2 05/05/2017   ALBUMIN 4.8 05/05/2017   CALCIUM 10.2 05/05/2017   ANIONGAP 10 03/21/2017   GFR 82.98 05/05/2017   Lab Results  Component Value Date   CHOL 218 (H) 05/05/2017  Lab Results  Component Value Date   HDL 42.00 05/05/2017   Lab Results  Component Value Date   LDLCALC 155 (H) 05/05/2017   Lab Results  Component Value Date   TRIG 107.0 05/05/2017   Lab Results  Component Value Date    CHOLHDL 5 05/05/2017   No results found for: HGBA1C       Assessment & Plan:   Problem List Items Addressed This Visit    Depression with anxiety    No significant depression but she notes significant anxiety so she will start back on Venlafaxine XR at 75 mg which has worked well for her in the past      Relevant Medications   venlafaxine XR (EFFEXOR XR) 75 MG 24 hr capsule   Insomnia    Encouraged good sleep hygiene such as dark, quiet room. No blue/green glowing lights such as computer screens in bedroom. No alcohol or stimulants in evening. Cut down on caffeine as able. Regular exercise is helpful but not just prior to bed time.       Overweight    Encouraged DASH diet, decrease po intake and increase exercise as tolerated. Needs 7-8 hours of sleep nightly. Avoid trans fats, eat small, frequent meals every 4-5 hours with lean proteins, complex carbs and healthy fats. Minimize simple carbs      Preventative health care    Patient encouraged to maintain heart healthy diet, regular exercise, adequate sleep. Consider daily probiotics. Take medications as prescribed      Essential hypertension    Well controlled, no changes to meds. Encouraged heart healthy diet such as the DASH diet and exercise as tolerated.       Relevant Orders   Comprehensive metabolic panel (Completed)   CBC (Completed)   TSH (Completed)   Hyperlipidemia, mixed    Encouraged heart healthy diet, increase exercise, avoid trans fats, consider a krill oil cap daily      Relevant Orders   Lipid panel (Completed)   ADHD    adderall short acting helped but only for a short time will try changing to XR 20 mg tabs and reassess      RESOLVED: Elevated BP without diagnosis of hypertension    Well controlled, no changes to meds. Encouraged heart healthy diet such as the DASH diet and exercise as tolerated.       Chronic vaginitis - Primary   Relevant Orders   Urine cytology ancillary only   Environmental  allergies    Is given refill on Montelukast to use prn       Other Visit Diagnoses    Acute pharyngitis, unspecified etiology       Relevant Medications   montelukast (SINGULAIR) 10 MG tablet      I have discontinued Yuliza Schar's ibuprofen, hydrochlorothiazide, metoprolol succinate, omeprazole, venlafaxine XR, famotidine, traZODone, venlafaxine XR, sucralfate, dicyclomine, and amphetamine-dextroamphetamine. I am also having her start on venlafaxine XR and amphetamine-dextroamphetamine. Additionally, I am having her maintain her albuterol, Levonorgestrel-Ethinyl Estradiol, mometasone-formoterol, valACYclovir, Dexlansoprazole, and montelukast.  Meds ordered this encounter  Medications  . montelukast (SINGULAIR) 10 MG tablet    Sig: Take 1 tablet (10 mg total) by mouth at bedtime.    Dispense:  90 tablet    Refill:  1  . venlafaxine XR (EFFEXOR XR) 75 MG 24 hr capsule    Sig: Take 1 capsule (75 mg total) by mouth daily with breakfast.    Dispense:  30 capsule    Refill:  3  .  amphetamine-dextroamphetamine (ADDERALL XR) 20 MG 24 hr capsule    Sig: Take 1 capsule (20 mg total) by mouth daily.    Dispense:  60 capsule    Refill:  0    CMA served as scribe during this visit. History, Physical and Plan performed by medical provider. Documentation and orders reviewed and attested to.  Penni Homans, MD

## 2017-05-04 NOTE — Assessment & Plan Note (Signed)
Encouraged heart healthy diet, increase exercise, avoid trans fats, consider a krill oil cap daily 

## 2017-05-04 NOTE — Assessment & Plan Note (Signed)
Encouraged DASH diet, decrease po intake and increase exercise as tolerated. Needs 7-8 hours of sleep nightly. Avoid trans fats, eat small, frequent meals every 4-5 hours with lean proteins, complex carbs and healthy fats. Minimize simple carbs 

## 2017-05-05 ENCOUNTER — Other Ambulatory Visit (INDEPENDENT_AMBULATORY_CARE_PROVIDER_SITE_OTHER): Payer: Managed Care, Other (non HMO)

## 2017-05-05 DIAGNOSIS — Z9109 Other allergy status, other than to drugs and biological substances: Secondary | ICD-10-CM | POA: Insufficient documentation

## 2017-05-05 DIAGNOSIS — N761 Subacute and chronic vaginitis: Secondary | ICD-10-CM | POA: Insufficient documentation

## 2017-05-05 DIAGNOSIS — I10 Essential (primary) hypertension: Secondary | ICD-10-CM | POA: Diagnosis not present

## 2017-05-05 LAB — COMPREHENSIVE METABOLIC PANEL
ALT: 28 U/L (ref 0–35)
AST: 22 U/L (ref 0–37)
Albumin: 4.8 g/dL (ref 3.5–5.2)
Alkaline Phosphatase: 68 U/L (ref 39–117)
BUN: 7 mg/dL (ref 6–23)
CO2: 26 mEq/L (ref 19–32)
Calcium: 10.2 mg/dL (ref 8.4–10.5)
Chloride: 107 mEq/L (ref 96–112)
Creatinine, Ser: 0.9 mg/dL (ref 0.40–1.20)
GFR: 82.98 mL/min (ref >60.00)
Glucose, Bld: 104 mg/dL — ABNORMAL HIGH (ref 70–99)
Potassium: 4 mEq/L (ref 3.5–5.1)
Sodium: 146 mEq/L — ABNORMAL HIGH (ref 135–145)
Total Bilirubin: 0.4 mg/dL (ref 0.2–1.2)
Total Protein: 7.2 g/dL (ref 6.0–8.3)

## 2017-05-05 LAB — CBC
HCT: 41.3 % (ref 36.0–46.0)
Hemoglobin: 13.9 g/dL (ref 12.0–15.0)
MCHC: 33.7 g/dL (ref 30.0–36.0)
MCV: 93.7 fl (ref 78.0–100.0)
Platelets: 374 10*3/uL (ref 150.0–400.0)
RBC: 4.4 Mil/uL (ref 3.87–5.11)
RDW: 13.1 % (ref 11.5–15.5)
WBC: 10.3 10*3/uL (ref 4.0–10.5)

## 2017-05-05 LAB — LIPID PANEL
Cholesterol: 218 mg/dL — ABNORMAL HIGH (ref 0–200)
HDL: 42 mg/dL (ref >39.00)
LDL Cholesterol: 155 mg/dL — ABNORMAL HIGH (ref 0–99)
NonHDL: 175.91
Total CHOL/HDL Ratio: 5
Triglycerides: 107 mg/dL (ref 0.0–149.0)
VLDL: 21.4 mg/dL (ref 0.0–40.0)

## 2017-05-05 LAB — TSH: TSH: 2.49 u[IU]/mL (ref 0.35–4.50)

## 2017-05-05 NOTE — Assessment & Plan Note (Signed)
Is given refill on Montelukast to use prn

## 2017-05-05 NOTE — Assessment & Plan Note (Signed)
adderall short acting helped but only for a short time will try changing to XR 20 mg tabs and reassess

## 2017-05-05 NOTE — Assessment & Plan Note (Signed)
No significant depression but she notes significant anxiety so she will start back on Venlafaxine XR at 75 mg which has worked well for her in the past

## 2017-05-05 NOTE — Assessment & Plan Note (Signed)
Patient encouraged to maintain heart healthy diet, regular exercise, adequate sleep. Consider daily probiotics. Take medications as prescribed 

## 2017-05-07 LAB — URINE CYTOLOGY ANCILLARY ONLY: Candida vaginitis: NEGATIVE

## 2017-05-10 ENCOUNTER — Other Ambulatory Visit: Payer: Self-pay

## 2017-05-10 MED ORDER — METRONIDAZOLE 500 MG PO TABS: 500.0000 mg | ORAL_TABLET | Freq: Three times a day (TID) | ORAL | 0 refills | Status: DC

## 2017-05-12 ENCOUNTER — Encounter: Payer: Self-pay | Admitting: Family Medicine

## 2017-05-20 ENCOUNTER — Other Ambulatory Visit: Payer: Self-pay | Admitting: Family Medicine

## 2017-05-20 ENCOUNTER — Telehealth: Payer: Self-pay | Admitting: Family Medicine

## 2017-05-20 MED ORDER — AMPHETAMINE-DEXTROAMPHET ER 20 MG PO CP24: 20.0000 mg | ORAL_CAPSULE | Freq: Two times a day (BID) | ORAL | 0 refills | Status: DC

## 2017-05-20 MED ORDER — FLUCONAZOLE 150 MG PO TABS: 150.0000 mg | ORAL_TABLET | ORAL | 0 refills | Status: DC

## 2017-05-20 NOTE — Telephone Encounter (Signed)
Last RX: 05/04/17, #30 but pharmacy on gave 30 since directions were for one a day. Pt now up to twice a day. Last OV: 05/04/17 Next OV: 06/03/17 UDS: none on file CSC: none on file CSR: No discrepancies identified

## 2017-05-20 NOTE — Telephone Encounter (Signed)
Patient needs a new prescription sent for Adderall taking BID. She says she is tolerating the increase to bid.  Will need today or tomorrow, she only has 2 pills left.  PCP: [place]: [id number] [place], [phone] (Phone) [phone] (Fax)

## 2017-05-20 NOTE — Telephone Encounter (Signed)
Called patient left vmail Sent rx in to [facility]

## 2017-05-20 NOTE — Telephone Encounter (Signed)
Copied from [place] [id number]. Topic: Quick Communication - See Telephone Encounter >> May 20, 2017 12:23 PM Ivar Drape wrote: CRM for notification. See Telephone encounter for: 05/20/17. Patient stated she was in the office to see Dr. Charlett Blake on 3/18 for Vaginal Bacteria.  She has finished the antibiotics that was given her but she is still experiencing itching, a lot of discharge and is very, very raw. Please advise.

## 2017-05-20 NOTE — Telephone Encounter (Signed)
Patient called in, given advice per Dr. Charlett Blake, diflucan sent to [place]. She asks [facility] [place], I advised [facility] to [facility] and [facility], she verbalized understanding.

## 2017-05-20 NOTE — Telephone Encounter (Signed)
Copied from [place] [id number]. Topic: General - Other >> May 19, 2017 11:55 AM Yvette Rack wrote: Reason for CRM: pt states that the  [id number] [place], [place] only gave pt 30 pills of the amphetamine-dextroamphetamine (ADDERALL XR) 20 MG 24 hr capsule they wouldn't fill the medicine because it has 60 capsules on RX so they gave her 30 caps she states that the pharmacy said that it need to be authorize to do 60 pills at once so she would need another RX written for 30 capsules only pt also states that she takes 2 pills daily

## 2017-05-21 NOTE — Telephone Encounter (Signed)
Patient seen PCP  

## 2017-05-25 NOTE — Telephone Encounter (Signed)
Noted  

## 2017-06-03 ENCOUNTER — Encounter: Payer: Self-pay | Admitting: Family Medicine

## 2017-06-03 ENCOUNTER — Ambulatory Visit (INDEPENDENT_AMBULATORY_CARE_PROVIDER_SITE_OTHER): Payer: Managed Care, Other (non HMO) | Admitting: Family Medicine

## 2017-06-03 VITALS — BP 120/70 | HR 102 | Temp 98.0°F | Resp 18 | Ht 68.0 in | Wt 188.2 lb

## 2017-06-03 DIAGNOSIS — F988 Other specified behavioral and emotional disorders with onset usually occurring in childhood and adolescence: Secondary | ICD-10-CM | POA: Diagnosis not present

## 2017-06-03 DIAGNOSIS — R1013 Epigastric pain: Secondary | ICD-10-CM | POA: Diagnosis not present

## 2017-06-03 DIAGNOSIS — Z9109 Other allergy status, other than to drugs and biological substances: Secondary | ICD-10-CM | POA: Diagnosis not present

## 2017-06-03 DIAGNOSIS — I10 Essential (primary) hypertension: Secondary | ICD-10-CM

## 2017-06-03 DIAGNOSIS — F418 Other specified anxiety disorders: Secondary | ICD-10-CM

## 2017-06-03 DIAGNOSIS — F909 Attention-deficit hyperactivity disorder, unspecified type: Secondary | ICD-10-CM

## 2017-06-03 MED ORDER — AMPHETAMINE-DEXTROAMPHETAMINE 10 MG PO TABS: 10.0000 mg | ORAL_TABLET | Freq: Every day | ORAL | 0 refills | Status: DC

## 2017-06-03 MED ORDER — AMPHETAMINE-DEXTROAMPHET ER 30 MG PO CP24: 30.0000 mg | ORAL_CAPSULE | ORAL | 0 refills | Status: DC

## 2017-06-03 MED ORDER — BUSPIRONE HCL 10 MG PO TABS: 10.0000 mg | ORAL_TABLET | Freq: Three times a day (TID) | ORAL | 1 refills | Status: DC

## 2017-06-03 NOTE — Patient Instructions (Signed)
Consider Zyrtec/Cetirizine 10 mg daily and continue the Singulair, Flonase can also be added Allergies An allergy is when your body reacts to a substance in a way that is not normal. An allergic reaction can happen after you:  Eat something.  Breathe in something.  Touch something.  You can be allergic to:  Things that are only around during certain seasons, like molds and pollens.  Foods.  Drugs.  Insects.  Animal dander.  What are the signs or symptoms?  Puffiness (swelling). This may happen on the lips, face, tongue, mouth, or throat.  Sneezing.  Coughing.  Breathing loudly (wheezing).  Stuffy nose.  Tingling in the mouth.  A rash.  Itching.  Itchy, red, puffy areas of skin (hives).  Watery eyes.  Throwing up (vomiting).  Watery poop (diarrhea).  Dizziness.  Feeling faint or fainting.  Trouble breathing or swallowing.  A tight feeling in the chest.  A fast heartbeat. How is this diagnosed? Allergies can be diagnosed with:  A medical and family history.  Skin tests.  Blood tests.  A food diary. A food diary is a record of all the foods, drinks, and symptoms you have each day.  The results of an elimination diet. This diet involves making sure not to eat certain foods and then seeing what happens when you start eating them again.  How is this treated? There is no cure for allergies, but allergic reactions can be treated with medicine. Severe reactions usually need to be treated at a hospital. How is this prevented? The best way to prevent an allergic reaction is to avoid the thing you are allergic to. Allergy shots and medicines can also help prevent reactions in some cases. This information is not intended to replace advice given to you by your health care provider. Make sure you discuss any questions you have with your health care provider. Document Released: 05/30/2012 Document Revised: 09/30/2015 Document Reviewed: 11/14/2013 Elsevier  Interactive Patient Education  [employer].

## 2017-06-03 NOTE — Progress Notes (Signed)
Subjective:  I acted as a Education administrator for Dr. Charlett Blake. [place], [place]  Patient ID: Kathryn [name], female    DOB: 19-May-1995, 22 y.o.   MRN: [id number]  No chief complaint on file.   HPI  Patient is in today for a 6 week follow up and she notes she is struggling with increased anxiety and irritability recently. She notes anhedonia but not struggling with suicidal or homicidal ideation. She is struggling with congestion from allergies. Denies CP/palp/SOB/HA/fevers/GI or GU c/o. Taking meds as prescribed  Patient Care Team: Mosie Lukes, MD as PCP - General (Family Medicine)   Past Medical History:  Diagnosis Date  . Abnormal liver function tests 02/02/2016  . ADHD 06/10/2016  . Alcohol use 02/23/2016  . Allergy   . Anxiety   . Depression   . Depression with anxiety 2014/05/11   PTSD s/p death of best friend   . Essential hypertension 02/02/2016  . GERD (gastroesophageal reflux disease)   . 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 Headache 04/09/2016  . Hyperlipidemia, mixed 02/02/2016  . Migraine   . Ovarian cyst   . PCOS (polycystic ovarian syndrome)   . Preventative health care 10/27/2015  . Thrombocytosis ([place]) 04/09/2016    Past Surgical History:  Procedure Laterality Date  . ADENOIDECTOMY    . Adnoids    . TYMPANOSTOMY TUBE PLACEMENT    . TYMPANOSTOMY TUBE PLACEMENT      Family History  Problem Relation Age of Onset  . Hypertension Mother   . Heart disease Mother        CAD, stent at 58, s/p cardiac arrest with Defib  . Hyperlipidemia Mother   . Diabetes Mother        s/p gastric bypass  . Obesity Mother        s/p gastric bypass  . Hyperlipidemia Father   . Hypertension Father   . Asthma Sister   . Hyperlipidemia Brother   . Stroke Maternal Grandmother   . Thyroid disease Maternal Grandmother   . Heart disease Maternal Grandfather        MI first at 39, s/p stents and CABG  . Hyperlipidemia Maternal Grandfather   . Hypertension Maternal Grandfather   . Stroke Maternal  Grandfather   . Cancer Maternal Grandfather        melanoma  . Aneurysm Maternal Grandfather        brain  . Kidney disease Maternal Grandfather        tumor  . Heart disease Paternal Grandmother   . Cancer Paternal Lucilla Edin and brain  . Heart disease Maternal Uncle   . Vision loss Maternal Uncle     Social History   Socioeconomic History  . Marital status: Single    Spouse name: Not on file  . Number of children: Not on file  . Years of education: Not on file  . Highest education level: Not on file  Occupational History  . Not on file  Social Needs  . Financial resource strain: Not on file  . Food insecurity:    Worry: Not on file    Inability: Not on file  . Transportation needs:    Medical: Not on file    Non-medical: Not on file  Tobacco Use  . Smoking status: Current Some Day Smoker    Packs/day: 1.00    Types: Cigarettes  . Smokeless tobacco: Current User    Types: Snuff  Substance and Sexual Activity  .  Alcohol use: No    Alcohol/week: 0.0 oz    Comment: every 6 months  . Drug use: No  . Sexual activity: Yes    Birth control/protection: Pill    Comment: lives with partner, works for [employer], no diewtary restrictions.   Lifestyle  . Physical activity:    Days per week: Not on file    Minutes per session: Not on file  . Stress: Not on file  Relationships  . Social connections:    Talks on phone: Not on file    Gets together: Not on file    Attends religious service: Not on file    Active member of club or organization: Not on file    Attends meetings of clubs or organizations: Not on file    Relationship status: Not on file  . Intimate partner violence:    Fear of current or ex partner: Not on file    Emotionally abused: Not on file    Physically abused: Not on file    Forced sexual activity: Not on file  Other Topics Concern  . Not on file  Social History Narrative  . Not on file    Outpatient Medications Prior to  Visit  Medication Sig Dispense Refill  . albuterol (PROVENTIL HFA;VENTOLIN HFA) 108 (90 Base) MCG/ACT inhaler Inhale 2 puffs into the lungs every 6 (six) hours as needed for wheezing or shortness of breath. 3 Inhaler 0  . amphetamine-dextroamphetamine (ADDERALL XR) 20 MG 24 hr capsule Take 1 capsule (20 mg total) by mouth 2 (two) times daily. 60 capsule 0  . Dexlansoprazole 30 MG capsule Take 30 mg by mouth daily.    . Levonorgestrel-Ethinyl Estradiol (AMETHIA,CAMRESE) 0.15-0.03 &0.01 MG tablet Take 1 tablet by mouth daily. 3 Package 0  . mometasone-formoterol (DULERA) 100-5 MCG/ACT AERO Inhale 2 puffs into the lungs 2 (two) times daily. 3 Inhaler 1  . montelukast (SINGULAIR) 10 MG tablet Take 1 tablet (10 mg total) by mouth at bedtime. 90 tablet 1  . valACYclovir (VALTREX) 1000 MG tablet 2 tab by mouth in AM and 2 tabs by mouth 12 hrs later at start of cold sore 60 tablet 0  . venlafaxine XR (EFFEXOR XR) 75 MG 24 hr capsule Take 1 capsule (75 mg total) by mouth daily with breakfast. 30 capsule 3  . fluconazole (DIFLUCAN) 150 MG tablet Take 1 tablet (150 mg total) by mouth once a week. 2 tablet 0  . metroNIDAZOLE (FLAGYL) 500 MG tablet Take 1 tablet (500 mg total) by mouth 3 (three) times daily. 21 tablet 0   No facility-administered medications prior to visit.     Allergies  Allergen Reactions  . Oxycodone Nausea Only    Oxycontin also    Review of Systems  Constitutional: Negative for fever and malaise/fatigue.  HENT: Positive for congestion. Negative for sinus pain.   Eyes: Negative for blurred vision.  Respiratory: Negative for shortness of breath.   Cardiovascular: Negative for chest pain, palpitations and leg swelling.  Gastrointestinal: Positive for abdominal pain. Negative for blood in stool, melena and nausea.  Genitourinary: Negative for dysuria and frequency.  Musculoskeletal: Negative for falls.  Skin: Negative for rash.  Neurological: Negative for dizziness, loss of  consciousness and headaches.  Endo/Heme/Allergies: Negative for environmental allergies.  Psychiatric/Behavioral: Positive for depression. Negative for hallucinations, memory loss, substance abuse and suicidal ideas. The patient is nervous/anxious. The patient does not have insomnia.        Objective:    Physical Exam  Constitutional: She is oriented to person, place, and time. She appears well-developed and well-nourished. No distress.  HENT:  Head: Normocephalic and atraumatic.  Nose: Nose normal.  Eyes: Right eye exhibits no discharge. Left eye exhibits no discharge.  Neck: Normal range of motion. Neck supple.  Cardiovascular: Normal rate and regular rhythm.  No murmur heard. Pulmonary/Chest: Effort normal and breath sounds normal.  Abdominal: Soft. Bowel sounds are normal. There is no tenderness.  Musculoskeletal: She exhibits no edema.  Neurological: She is alert and oriented to person, place, and time.  Skin: Skin is warm and dry.  Psychiatric: She has a normal mood and affect.  Nursing note and vitals reviewed.   BP 120/70 (BP Location: Left Arm, Patient Position: Sitting, Cuff Size: Normal)   Pulse (!) 102   Temp 98 F (36.7 C) (Oral)   Resp 18   Ht 5\' 8"  (1.727 m)   Wt 188 lb 3.2 oz (85.4 kg)   SpO2 98%   BMI 28.62 kg/m  Wt Readings from Last 3 Encounters:  06/03/17 188 lb 3.2 oz (85.4 kg)  05/04/17 194 lb 12.8 oz (88.4 kg)  04/05/17 198 lb 6.4 oz (90 kg)   BP Readings from Last 3 Encounters:  06/03/17 120/70  05/04/17 116/80  04/05/17 118/77     Immunization History  Administered Date(s) Administered  . Influenza,inj,Quad PF,6+ Mos 10/13/2016  . Influenza-Unspecified 12/01/2014, 12/30/2015    Health Maintenance  Topic Date Due  . HIV Screening  01/11/2010  . TETANUS/TDAP  01/11/2014  . PAP SMEAR  01/12/2016  . INFLUENZA VACCINE  09/16/2017  . CHLAMYDIA SCREENING  03/21/2018    Lab Results  Component Value Date   WBC 10.3 05/05/2017   HGB 13.9  05/05/2017   HCT 41.3 05/05/2017   PLT 374.0 05/05/2017   GLUCOSE 104 (H) 05/05/2017   CHOL 218 (H) 05/05/2017   TRIG 107.0 05/05/2017   HDL 42.00 05/05/2017   LDLDIRECT 138.0 01/22/2016   LDLCALC 155 (H) 05/05/2017   ALT 28 05/05/2017   AST 22 05/05/2017   NA 146 (H) 05/05/2017   K 4.0 05/05/2017   CL 107 05/05/2017   CREATININE 0.90 05/05/2017   BUN 7 05/05/2017   CO2 26 05/05/2017   TSH 2.49 05/05/2017    Lab Results  Component Value Date   TSH 2.49 05/05/2017   Lab Results  Component Value Date   WBC 10.3 05/05/2017   HGB 13.9 05/05/2017   HCT 41.3 05/05/2017   MCV 93.7 05/05/2017   PLT 374.0 05/05/2017   Lab Results  Component Value Date   NA 146 (H) 05/05/2017   K 4.0 05/05/2017   CO2 26 05/05/2017   GLUCOSE 104 (H) 05/05/2017   BUN 7 05/05/2017   CREATININE 0.90 05/05/2017   BILITOT 0.4 05/05/2017   ALKPHOS 68 05/05/2017   AST 22 05/05/2017   ALT 28 05/05/2017   PROT 7.2 05/05/2017   ALBUMIN 4.8 05/05/2017   CALCIUM 10.2 05/05/2017   ANIONGAP 10 03/21/2017   GFR 82.98 05/05/2017   Lab Results  Component Value Date   CHOL 218 (H) 05/05/2017   Lab Results  Component Value Date   HDL 42.00 05/05/2017   Lab Results  Component Value Date   LDLCALC 155 (H) 05/05/2017   Lab Results  Component Value Date   TRIG 107.0 05/05/2017   Lab Results  Component Value Date   CHOLHDL 5 05/05/2017   No results found for: HGBA1C  Assessment & Plan:   Problem List Items Addressed This Visit    Depression with anxiety - Primary    Referred to psychiatry for further consideration is noting significant uptick in her anxiety recently. Will try adding Buspar 10 mg tid while she gets established with psych. Venlafaxine XR 75 mg daily.      Relevant Medications   busPIRone (BUSPAR) 10 MG tablet   Other Relevant Orders   Ambulatory referral to Psychiatry   Abdominal pain    Is following with Dr Benson Norway, gastroenterology and improving      Essential  hypertension    Well controlled, no changes to meds. Encouraged heart healthy diet such as the DASH diet and exercise as tolerated.       ADHD    Tolerating Adderal XR. Continue the same      Environmental allergies    Continue Singulair and can add Zyrtec and Flonase.        Other Visit Diagnoses    Attention deficit disorder, unspecified hyperactivity presence       Relevant Medications   amphetamine-dextroamphetamine (ADDERALL XR) 30 MG 24 hr capsule   amphetamine-dextroamphetamine (ADDERALL) 10 MG tablet   Other Relevant Orders   Ambulatory referral to Psychiatry      I have discontinued Zenaida Balsley's metroNIDAZOLE and fluconazole. I am also having her start on busPIRone, amphetamine-dextroamphetamine, and amphetamine-dextroamphetamine. Additionally, I am having her maintain her albuterol, Levonorgestrel-Ethinyl Estradiol, mometasone-formoterol, valACYclovir, Dexlansoprazole, montelukast, venlafaxine XR, and amphetamine-dextroamphetamine.  Meds ordered this encounter  Medications  . busPIRone (BUSPAR) 10 MG tablet    Sig: Take 1 tablet (10 mg total) by mouth 3 (three) times daily.    Dispense:  90 tablet    Refill:  1  . amphetamine-dextroamphetamine (ADDERALL XR) 30 MG 24 hr capsule    Sig: Take 1 capsule (30 mg total) by mouth every morning.    Dispense:  30 capsule    Refill:  0  . amphetamine-dextroamphetamine (ADDERALL) 10 MG tablet    Sig: Take 1 tablet (10 mg total) by mouth daily with breakfast.    Dispense:  30 tablet    Refill:  0    CMA served as scribe during this visit. History, Physical and Plan performed by medical provider. Documentation and orders reviewed and attested to.  Penni Homans, MD

## 2017-06-07 NOTE — Assessment & Plan Note (Signed)
Continue Singulair and can add Zyrtec and Flonase.

## 2017-06-07 NOTE — Assessment & Plan Note (Signed)
Well controlled, no changes to meds. Encouraged heart healthy diet such as the DASH diet and exercise as tolerated.  °

## 2017-06-07 NOTE — Assessment & Plan Note (Signed)
Referred to psychiatry for further consideration is noting significant uptick in her anxiety recently. Will try adding Buspar 10 mg tid while she gets established with psych. Venlafaxine XR 75 mg daily.

## 2017-06-07 NOTE — Assessment & Plan Note (Signed)
Is following with Dr Benson Norway, gastroenterology and improving

## 2017-06-07 NOTE — Assessment & Plan Note (Signed)
Tolerating Adderal XR. Continue the same

## 2017-06-11 ENCOUNTER — Encounter: Payer: Self-pay | Admitting: Family Medicine

## 2017-06-11 NOTE — Progress Notes (Signed)
Endoscopy, date of exam 04/06/17. Normal Esophagus, normal stomach-biopsied. Normal examined duodenum.

## 2017-06-21 ENCOUNTER — Ambulatory Visit (INDEPENDENT_AMBULATORY_CARE_PROVIDER_SITE_OTHER): Payer: Managed Care, Other (non HMO) | Admitting: Family Medicine

## 2017-06-21 ENCOUNTER — Encounter: Payer: Self-pay | Admitting: Family Medicine

## 2017-06-21 VITALS — BP 136/80 | HR 77 | Temp 98.9°F | Resp 16 | Ht 68.0 in | Wt 182.6 lb

## 2017-06-21 DIAGNOSIS — G5603 Carpal tunnel syndrome, bilateral upper limbs: Secondary | ICD-10-CM

## 2017-06-21 MED ORDER — PREDNISONE 10 MG PO TABS: ORAL_TABLET | ORAL | 0 refills | Status: DC

## 2017-06-21 NOTE — Patient Instructions (Signed)
Carpal Tunnel Syndrome Carpal tunnel syndrome is a condition that causes pain in your hand and arm. The carpal tunnel is a narrow area located on the palm side of your wrist. Repeated wrist motion or certain diseases may cause swelling within the tunnel. This swelling pinches the main nerve in the wrist (median nerve). What are the causes? This condition may be caused by:  Repeated wrist motions.  Wrist injuries.  Arthritis.  A cyst or tumor in the carpal tunnel.  Fluid buildup during pregnancy.  Sometimes the cause of this condition is not known. What increases the risk? This condition is more likely to develop in:  People who have jobs that cause them to repeatedly move their wrists in the same motion, such as butchers and cashiers.  Women.  People with certain conditions, such as: ? Diabetes. ? Obesity. ? An underactive thyroid (hypothyroidism). ? Kidney failure.  What are the signs or symptoms? Symptoms of this condition include:  A tingling feeling in your fingers, especially in your thumb, index, and middle fingers.  Tingling or numbness in your hand.  An aching feeling in your entire arm, especially when your wrist and elbow are bent for long periods of time.  Wrist pain that goes up your arm to your shoulder.  Pain that goes down into your palm or fingers.  A weak feeling in your hands. You may have trouble grabbing and holding items.  Your symptoms may feel worse during the night. How is this diagnosed? This condition is diagnosed with a medical history and physical exam. You may also have tests, including:  An electromyogram (EMG). This test measures electrical signals sent by your nerves into the muscles.  X-rays.  How is this treated? Treatment for this condition includes:  Lifestyle changes. It is important to stop doing or modify the activity that caused your condition.  Physical or occupational therapy.  Medicines for pain and inflammation.  This may include medicine that is injected into your wrist.  A wrist splint.  Surgery.  Follow these instructions at home: If you have a splint:  Wear it as told by your health care provider. Remove it only as told by your health care provider.  Loosen the splint if your fingers become numb and tingle, or if they turn cold and blue.  Keep the splint clean and dry. General instructions  Take over-the-counter and prescription medicines only as told by your health care provider.  Rest your wrist from any activity that may be causing your pain. If your condition is work related, talk to your employer about changes that can be made, such as getting a wrist pad to use while typing.  If directed, apply ice to the painful area: ? Put ice in a plastic bag. ? Place a towel between your skin and the bag. ? Leave the ice on for 20 minutes, 2-3 times per day.  Keep all follow-up visits as told by your health care provider. This is important.  Do any exercises as told by your health care provider, physical therapist, or occupational therapist. Contact a health care provider if:  You have new symptoms.  Your pain is not controlled with medicines.  Your symptoms get worse. This information is not intended to replace advice given to you by your health care provider. Make sure you discuss any questions you have with your health care provider. Document Released: 01/31/2000 Document Revised: 06/13/2015 Document Reviewed: 06/20/2014 [employer] Interactive Patient Education  2018 [employer]  

## 2017-06-21 NOTE — Progress Notes (Signed)
Patient ID: Kathryn [name], female   DOB: 07-12-1995, 22 y.o.   MRN: [id number]     Subjective:  I acted as a Education administrator for Dr. Carollee Herter.  Kathryn [name], [place]   Patient ID: Kathryn [name], female    DOB: Jun 17, 1995, 22 y.o.   MRN: [id number]  Chief Complaint  Patient presents with  . Wrist Pain    right     Wrist Pain   The pain is present in the right wrist. This is a chronic problem. Episode onset: years. The problem occurs constantly. Associated symptoms include an inability to bear weight, joint swelling, a limited range of motion, numbness, stiffness and tingling. Pertinent negatives include no fever. Treatments tried: wrist splint. ibuprofen.    Patient is in today for right wrist pain.  Patient Care Team: Mosie Lukes, MD as PCP - General (Family Medicine)   Past Medical History:  Diagnosis Date  . Abnormal liver function tests 02/02/2016  . ADHD 06/10/2016  . Alcohol use 02/23/2016  . Allergy   . Anxiety   . Depression   . Depression with anxiety 2014-05-10   PTSD s/p death of best friend   . Essential hypertension 02/02/2016  . GERD (gastroesophageal reflux disease)   . 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 Headache 04/09/2016  . Hyperlipidemia, mixed 02/02/2016  . Migraine   . Ovarian cyst   . PCOS (polycystic ovarian syndrome)   . Preventative health care 10/27/2015  . Thrombocytosis ([place]) 04/09/2016    Past Surgical History:  Procedure Laterality Date  . ADENOIDECTOMY    . Adnoids    . TYMPANOSTOMY TUBE PLACEMENT    . TYMPANOSTOMY TUBE PLACEMENT      Family History  Problem Relation Age of Onset  . Hypertension Mother   . Heart disease Mother        CAD, stent at 47, s/p cardiac arrest with Defib  . Hyperlipidemia Mother   . Diabetes Mother        s/p gastric bypass  . Obesity Mother        s/p gastric bypass  . Hyperlipidemia Father   . Hypertension Father   . Asthma Sister   . Hyperlipidemia Brother   . Stroke Maternal Grandmother   . Thyroid disease Maternal  Grandmother   . Heart disease Maternal Grandfather        MI first at 19, s/p stents and CABG  . Hyperlipidemia Maternal Grandfather   . Hypertension Maternal Grandfather   . Stroke Maternal Grandfather   . Cancer Maternal Grandfather        melanoma  . Aneurysm Maternal Grandfather        brain  . Kidney disease Maternal Grandfather        tumor  . Heart disease Paternal Grandmother   . Cancer Paternal Lucilla Edin and brain  . Heart disease Maternal Uncle   . Vision loss Maternal Uncle     Social History   Socioeconomic History  . Marital status: Single    Spouse name: Not on file  . Number of children: Not on file  . Years of education: Not on file  . Highest education level: Not on file  Occupational History  . Not on file  Social Needs  . Financial resource strain: Not on file  . Food insecurity:    Worry: Not on file    Inability: Not on file  . Transportation needs:    Medical: Not on file  Non-medical: Not on file  Tobacco Use  . Smoking status: Current Some Day Smoker    Packs/day: 1.00    Types: Cigarettes  . Smokeless tobacco: Current User    Types: Snuff  Substance and Sexual Activity  . Alcohol use: No    Alcohol/week: 0.0 oz    Comment: every 6 months  . Drug use: No  . Sexual activity: Yes    Birth control/protection: Pill    Comment: lives with partner, works for [employer], no diewtary restrictions.   Lifestyle  . Physical activity:    Days per week: Not on file    Minutes per session: Not on file  . Stress: Not on file  Relationships  . Social connections:    Talks on phone: Not on file    Gets together: Not on file    Attends religious service: Not on file    Active member of club or organization: Not on file    Attends meetings of clubs or organizations: Not on file    Relationship status: Not on file  . Intimate partner violence:    Fear of current or ex partner: Not on file    Emotionally abused: Not on file     Physically abused: Not on file    Forced sexual activity: Not on file  Other Topics Concern  . Not on file  Social History Narrative  . Not on file    Outpatient Medications Prior to Visit  Medication Sig Dispense Refill  . albuterol (PROVENTIL HFA;VENTOLIN HFA) 108 (90 Base) MCG/ACT inhaler Inhale 2 puffs into the lungs every 6 (six) hours as needed for wheezing or shortness of breath. 3 Inhaler 0  . amphetamine-dextroamphetamine (ADDERALL XR) 30 MG 24 hr capsule Take 1 capsule (30 mg total) by mouth every morning. 30 capsule 0  . amphetamine-dextroamphetamine (ADDERALL) 10 MG tablet Take 1 tablet (10 mg total) by mouth daily with breakfast. 30 tablet 0  . busPIRone (BUSPAR) 10 MG tablet Take 1 tablet (10 mg total) by mouth 3 (three) times daily. 90 tablet 1  . Dexlansoprazole 30 MG capsule Take 30 mg by mouth daily.    . Levonorgestrel-Ethinyl Estradiol (AMETHIA,CAMRESE) 0.15-0.03 &0.01 MG tablet Take 1 tablet by mouth daily. 3 Package 0  . mometasone-formoterol (DULERA) 100-5 MCG/ACT AERO Inhale 2 puffs into the lungs 2 (two) times daily. 3 Inhaler 1  . montelukast (SINGULAIR) 10 MG tablet Take 1 tablet (10 mg total) by mouth at bedtime. 90 tablet 1  . valACYclovir (VALTREX) 1000 MG tablet 2 tab by mouth in AM and 2 tabs by mouth 12 hrs later at start of cold sore 60 tablet 0  . venlafaxine XR (EFFEXOR XR) 75 MG 24 hr capsule Take 1 capsule (75 mg total) by mouth daily with breakfast. 30 capsule 3  . amphetamine-dextroamphetamine (ADDERALL XR) 20 MG 24 hr capsule Take 1 capsule (20 mg total) by mouth 2 (two) times daily. 60 capsule 0   No facility-administered medications prior to visit.     Allergies  Allergen Reactions  . Oxycodone Nausea Only    Oxycontin also    Review of Systems  Constitutional: Negative for chills, fever and malaise/fatigue.  HENT: Negative for congestion and hearing loss.   Eyes: Negative for discharge.  Respiratory: Negative for cough, sputum production  and shortness of breath.   Cardiovascular: Negative for chest pain, palpitations and leg swelling.  Gastrointestinal: Negative for abdominal pain, blood in stool, constipation, diarrhea, heartburn, nausea and vomiting.  Genitourinary: Negative for dysuria, frequency, hematuria and urgency.  Musculoskeletal: Positive for stiffness. Negative for back pain, falls and myalgias.  Skin: Negative for rash.  Neurological: Positive for tingling and numbness. Negative for dizziness, sensory change, loss of consciousness, weakness and headaches.  Endo/Heme/Allergies: Negative for environmental allergies. Does not bruise/bleed easily.  Psychiatric/Behavioral: Negative for depression and suicidal ideas. The patient is not nervous/anxious and does not have insomnia.        Objective:    Physical Exam  Musculoskeletal: She exhibits tenderness.       Right wrist: She exhibits tenderness.       Left wrist: She exhibits tenderness.       Arms: Nursing note and vitals reviewed.   BP 136/80 (BP Location: Right Arm, Cuff Size: Normal)   Pulse 77   Temp 98.9 F (37.2 C) (Oral)   Resp 16   Ht 5\' 8"  (1.727 m)   Wt 182 lb 9.6 oz (82.8 kg)   LMP 05/31/2017   SpO2 98%   BMI 27.76 kg/m  Wt Readings from Last 3 Encounters:  06/21/17 182 lb 9.6 oz (82.8 kg)  06/03/17 188 lb 3.2 oz (85.4 kg)  05/04/17 194 lb 12.8 oz (88.4 kg)   BP Readings from Last 3 Encounters:  06/21/17 136/80  06/03/17 120/70  05/04/17 116/80     Immunization History  Administered Date(s) Administered  . Influenza,inj,Quad PF,6+ Mos 10/13/2016  . Influenza-Unspecified 12/01/2014, 12/30/2015    Health Maintenance  Topic Date Due  . HIV Screening  01/11/2010  . TETANUS/TDAP  01/11/2014  . PAP SMEAR  01/12/2016  . INFLUENZA VACCINE  09/16/2017  . CHLAMYDIA SCREENING  03/21/2018    Lab Results  Component Value Date   WBC 10.3 05/05/2017   HGB 13.9 05/05/2017   HCT 41.3 05/05/2017   PLT 374.0 05/05/2017   GLUCOSE 104  (H) 05/05/2017   CHOL 218 (H) 05/05/2017   TRIG 107.0 05/05/2017   HDL 42.00 05/05/2017   LDLDIRECT 138.0 01/22/2016   LDLCALC 155 (H) 05/05/2017   ALT 28 05/05/2017   AST 22 05/05/2017   NA 146 (H) 05/05/2017   K 4.0 05/05/2017   CL 107 05/05/2017   CREATININE 0.90 05/05/2017   BUN 7 05/05/2017   CO2 26 05/05/2017   TSH 2.49 05/05/2017    Lab Results  Component Value Date   TSH 2.49 05/05/2017   Lab Results  Component Value Date   WBC 10.3 05/05/2017   HGB 13.9 05/05/2017   HCT 41.3 05/05/2017   MCV 93.7 05/05/2017   PLT 374.0 05/05/2017   Lab Results  Component Value Date   NA 146 (H) 05/05/2017   K 4.0 05/05/2017   CO2 26 05/05/2017   GLUCOSE 104 (H) 05/05/2017   BUN 7 05/05/2017   CREATININE 0.90 05/05/2017   BILITOT 0.4 05/05/2017   ALKPHOS 68 05/05/2017   AST 22 05/05/2017   ALT 28 05/05/2017   PROT 7.2 05/05/2017   ALBUMIN 4.8 05/05/2017   CALCIUM 10.2 05/05/2017   ANIONGAP 10 03/21/2017   GFR 82.98 05/05/2017   Lab Results  Component Value Date   CHOL 218 (H) 05/05/2017   Lab Results  Component Value Date   HDL 42.00 05/05/2017   Lab Results  Component Value Date   LDLCALC 155 (H) 05/05/2017   Lab Results  Component Value Date   TRIG 107.0 05/05/2017   Lab Results  Component Value Date   CHOLHDL 5 05/05/2017   No results found for:  HGBA1C       Assessment & Plan:   Problem List Items Addressed This Visit    None    Visit Diagnoses    Bilateral carpal tunnel syndrome    -  Primary   Relevant Medications   predniSONE (DELTASONE) 10 MG tablet   Other Relevant Orders   Ambulatory referral to Orthopedic Surgery    wear splint  Refer to ortho Can use otc lidocaine patch prn   I am having [name] start on predniSONE. I am also having her maintain her albuterol, Levonorgestrel-Ethinyl Estradiol, mometasone-formoterol, valACYclovir, Dexlansoprazole, montelukast, venlafaxine XR, busPIRone, amphetamine-dextroamphetamine, and  amphetamine-dextroamphetamine.  Meds ordered this encounter  Medications  . predniSONE (DELTASONE) 10 MG tablet    Sig: TAKE 3 TABLETS PO QD FOR 3 DAYS THEN TAKE 2 TABLETS PO QD FOR 3 DAYS THEN TAKE 1 TABLET PO QD FOR 3 DAYS THEN TAKE 1/2 TAB PO QD FOR 3 DAYS    Dispense:  20 tablet    Refill:  0    CMA served as Education administrator during this visit. History, Physical and Plan performed by medical provider. Documentation and orders reviewed and attested to.  Ann Held, DO

## 2017-07-06 ENCOUNTER — Ambulatory Visit (INDEPENDENT_AMBULATORY_CARE_PROVIDER_SITE_OTHER): Payer: Managed Care, Other (non HMO) | Admitting: Family Medicine

## 2017-07-06 VITALS — BP 122/80 | HR 81 | Temp 98.4°F | Resp 18 | Wt 185.4 lb

## 2017-07-06 DIAGNOSIS — F988 Other specified behavioral and emotional disorders with onset usually occurring in childhood and adolescence: Secondary | ICD-10-CM

## 2017-07-06 DIAGNOSIS — N946 Dysmenorrhea, unspecified: Secondary | ICD-10-CM

## 2017-07-06 DIAGNOSIS — M2550 Pain in unspecified joint: Secondary | ICD-10-CM

## 2017-07-06 DIAGNOSIS — E663 Overweight: Secondary | ICD-10-CM | POA: Diagnosis not present

## 2017-07-06 DIAGNOSIS — E782 Mixed hyperlipidemia: Secondary | ICD-10-CM

## 2017-07-06 DIAGNOSIS — I10 Essential (primary) hypertension: Secondary | ICD-10-CM

## 2017-07-06 DIAGNOSIS — R29898 Other symptoms and signs involving the musculoskeletal system: Secondary | ICD-10-CM

## 2017-07-06 DIAGNOSIS — F909 Attention-deficit hyperactivity disorder, unspecified type: Secondary | ICD-10-CM

## 2017-07-06 DIAGNOSIS — M791 Myalgia, unspecified site: Secondary | ICD-10-CM

## 2017-07-06 MED ORDER — LEVONORGEST-ETH ESTRAD 91-DAY 0.15-0.03 &0.01 MG PO TABS: 1.0000 | ORAL_TABLET | Freq: Every day | ORAL | 0 refills | Status: DC

## 2017-07-06 MED ORDER — AMPHETAMINE-DEXTROAMPHET ER 30 MG PO CP24: 30.0000 mg | ORAL_CAPSULE | ORAL | 0 refills | Status: DC

## 2017-07-06 MED ORDER — VALACYCLOVIR HCL 1 G PO TABS: ORAL_TABLET | ORAL | 0 refills | Status: DC

## 2017-07-06 MED ORDER — AMPHETAMINE-DEXTROAMPHETAMINE 10 MG PO TABS: 10.0000 mg | ORAL_TABLET | Freq: Every day | ORAL | 0 refills | Status: DC

## 2017-07-06 NOTE — Patient Instructions (Signed)

## 2017-07-06 NOTE — Progress Notes (Signed)
Subjective:  I acted as a Education administrator for Dr. Charlett Blake. [place], [place]  Patient ID: Kathryn [name], female    DOB: 04-Nov-1995, 22 y.o.   MRN: [id number]  No chief complaint on file.   HPI  Patient is in today for a follow up. She is following up on her ADHD, anxiety, allergies, hypertension and more. She is noting some new trouble with her right hand. She has been seeing orthopaedics and they are referring her to neurology due to pain in right wrist and numbness into hand. She notes twitching in hand/fingers qhs. No recent febrile illness or hospitalizations. Denies CP/palp/SOB/HA/congestion/fevers/GI or GU c/o. Taking meds as prescribed  Patient Care Team: Mosie Lukes, MD as PCP - General (Family Medicine)   Past Medical History:  Diagnosis Date  . Abnormal liver function tests 02/02/2016  . ADHD 06/10/2016  . Alcohol use 02/23/2016  . Allergy   . Anxiety   . Depression   . Depression with anxiety May 02, 2014   PTSD s/p death of best friend   . Essential hypertension 02/02/2016  . GERD (gastroesophageal reflux disease)   . 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 Headache 04/09/2016  . Hyperlipidemia, mixed 02/02/2016  . Migraine   . Ovarian cyst   . PCOS (polycystic ovarian syndrome)   . Preventative health care 10/27/2015  . Thrombocytosis ([place]) 04/09/2016    Past Surgical History:  Procedure Laterality Date  . ADENOIDECTOMY    . Adnoids    . TYMPANOSTOMY TUBE PLACEMENT    . TYMPANOSTOMY TUBE PLACEMENT      Family History  Problem Relation Age of Onset  . Hypertension Mother   . Heart disease Mother        CAD, stent at 55, s/p cardiac arrest with Defib  . Hyperlipidemia Mother   . Diabetes Mother        s/p gastric bypass  . Obesity Mother        s/p gastric bypass  . Hyperlipidemia Father   . Hypertension Father   . Asthma Sister   . Hyperlipidemia Brother   . Stroke Maternal Grandmother   . Thyroid disease Maternal Grandmother   . Heart disease Maternal Grandfather        MI  first at 21, s/p stents and CABG  . Hyperlipidemia Maternal Grandfather   . Hypertension Maternal Grandfather   . Stroke Maternal Grandfather   . Cancer Maternal Grandfather        melanoma  . Aneurysm Maternal Grandfather        brain  . Kidney disease Maternal Grandfather        tumor  . Heart disease Paternal Grandmother   . Cancer Paternal Lucilla Edin and brain  . Heart disease Maternal Uncle   . Vision loss Maternal Uncle     Social History   Socioeconomic History  . Marital status: Single    Spouse name: Not on file  . Number of children: Not on file  . Years of education: Not on file  . Highest education level: Not on file  Occupational History  . Not on file  Social Needs  . Financial resource strain: Not on file  . Food insecurity:    Worry: Not on file    Inability: Not on file  . Transportation needs:    Medical: Not on file    Non-medical: Not on file  Tobacco Use  . Smoking status: Current Some Day Smoker  Packs/day: 1.00    Types: Cigarettes  . Smokeless tobacco: Current User    Types: Snuff  Substance and Sexual Activity  . Alcohol use: No    Alcohol/week: 0.0 oz    Comment: every 6 months  . Drug use: No  . Sexual activity: Yes    Birth control/protection: Pill    Comment: lives with partner, works for [employer], no diewtary restrictions.   Lifestyle  . Physical activity:    Days per week: Not on file    Minutes per session: Not on file  . Stress: Not on file  Relationships  . Social connections:    Talks on phone: Not on file    Gets together: Not on file    Attends religious service: Not on file    Active member of club or organization: Not on file    Attends meetings of clubs or organizations: Not on file    Relationship status: Not on file  . Intimate partner violence:    Fear of current or ex partner: Not on file    Emotionally abused: Not on file    Physically abused: Not on file    Forced sexual activity: Not  on file  Other Topics Concern  . Not on file  Social History Narrative  . Not on file    Outpatient Medications Prior to Visit  Medication Sig Dispense Refill  . albuterol (PROVENTIL HFA;VENTOLIN HFA) 108 (90 Base) MCG/ACT inhaler Inhale 2 puffs into the lungs every 6 (six) hours as needed for wheezing or shortness of breath. 3 Inhaler 0  . busPIRone (BUSPAR) 10 MG tablet Take 1 tablet (10 mg total) by mouth 3 (three) times daily. 90 tablet 1  . Dexlansoprazole 30 MG capsule Take 30 mg by mouth daily.    . mometasone-formoterol (DULERA) 100-5 MCG/ACT AERO Inhale 2 puffs into the lungs 2 (two) times daily. 3 Inhaler 1  . montelukast (SINGULAIR) 10 MG tablet Take 1 tablet (10 mg total) by mouth at bedtime. 90 tablet 1  . venlafaxine XR (EFFEXOR XR) 75 MG 24 hr capsule Take 1 capsule (75 mg total) by mouth daily with breakfast. 30 capsule 3  . amphetamine-dextroamphetamine (ADDERALL XR) 30 MG 24 hr capsule Take 1 capsule (30 mg total) by mouth every morning. 30 capsule 0  . amphetamine-dextroamphetamine (ADDERALL) 10 MG tablet Take 1 tablet (10 mg total) by mouth daily with breakfast. 30 tablet 0  . Levonorgestrel-Ethinyl Estradiol (AMETHIA,CAMRESE) 0.15-0.03 &0.01 MG tablet Take 1 tablet by mouth daily. 3 Package 0  . predniSONE (DELTASONE) 10 MG tablet TAKE 3 TABLETS PO QD FOR 3 DAYS THEN TAKE 2 TABLETS PO QD FOR 3 DAYS THEN TAKE 1 TABLET PO QD FOR 3 DAYS THEN TAKE 1/2 TAB PO QD FOR 3 DAYS 20 tablet 0  . valACYclovir (VALTREX) 1000 MG tablet 2 tab by mouth in AM and 2 tabs by mouth 12 hrs later at start of cold sore 60 tablet 0   No facility-administered medications prior to visit.     Allergies  Allergen Reactions  . Oxycodone Nausea Only    Oxycontin also    Review of Systems  Constitutional: Negative for fever and malaise/fatigue.  HENT: Negative for congestion.   Eyes: Negative for blurred vision.  Respiratory: Negative for shortness of breath.   Cardiovascular: Negative for  chest pain, palpitations and leg swelling.  Gastrointestinal: Negative for abdominal pain, blood in stool and nausea.  Genitourinary: Negative for dysuria and frequency.  Musculoskeletal: Positive  for joint pain and myalgias. Negative for falls.  Skin: Negative for rash.  Neurological: Positive for focal weakness. Negative for dizziness, loss of consciousness, weakness and headaches.  Endo/Heme/Allergies: Negative for environmental allergies.  Psychiatric/Behavioral: Negative for depression. The patient is nervous/anxious.        Objective:    Physical Exam  Constitutional: She is oriented to person, place, and time. No distress.  HENT:  Head: Normocephalic and atraumatic.  Eyes: Conjunctivae are normal.  Neck: Neck supple. No thyromegaly present.  Cardiovascular: Normal rate, regular rhythm and normal heart sounds.  No murmur heard. Pulmonary/Chest: Effort normal and breath sounds normal. She has no wheezes.  Abdominal: She exhibits no distension and no mass.  Musculoskeletal: She exhibits no edema.  Lymphadenopathy:    She has no cervical adenopathy.  Neurological: She is alert and oriented to person, place, and time.  Skin: Skin is warm and dry. No rash noted. She is not diaphoretic.  Psychiatric: Judgment normal.    BP 122/80 (BP Location: Left Arm, Patient Position: Sitting, Cuff Size: Normal)   Pulse 81   Temp 98.4 F (36.9 C) (Oral)   Resp 18   Wt 185 lb 6.4 oz (84.1 kg)   SpO2 98%   BMI 28.19 kg/m  Wt Readings from Last 3 Encounters:  07/06/17 185 lb 6.4 oz (84.1 kg)  06/21/17 182 lb 9.6 oz (82.8 kg)  06/03/17 188 lb 3.2 oz (85.4 kg)   BP Readings from Last 3 Encounters:  07/06/17 122/80  06/21/17 136/80  06/03/17 120/70     Immunization History  Administered Date(s) Administered  . Influenza,inj,Quad PF,6+ Mos 10/13/2016  . Influenza-Unspecified 12/01/2014, 12/30/2015    Health Maintenance  Topic Date Due  . HIV Screening  01/11/2010  .  TETANUS/TDAP  01/11/2014  . PAP SMEAR  01/12/2016  . INFLUENZA VACCINE  09/16/2017  . CHLAMYDIA SCREENING  03/21/2018    Lab Results  Component Value Date   WBC 10.3 05/05/2017   HGB 13.9 05/05/2017   HCT 41.3 05/05/2017   PLT 374.0 05/05/2017   GLUCOSE 104 (H) 05/05/2017   CHOL 218 (H) 05/05/2017   TRIG 107.0 05/05/2017   HDL 42.00 05/05/2017   LDLDIRECT 138.0 01/22/2016   LDLCALC 155 (H) 05/05/2017   ALT 28 05/05/2017   AST 22 05/05/2017   NA 146 (H) 05/05/2017   K 4.0 05/05/2017   CL 107 05/05/2017   CREATININE 0.90 05/05/2017   BUN 7 05/05/2017   CO2 26 05/05/2017   TSH 2.49 05/05/2017    Lab Results  Component Value Date   TSH 2.49 05/05/2017   Lab Results  Component Value Date   WBC 10.3 05/05/2017   HGB 13.9 05/05/2017   HCT 41.3 05/05/2017   MCV 93.7 05/05/2017   PLT 374.0 05/05/2017   Lab Results  Component Value Date   NA 146 (H) 05/05/2017   K 4.0 05/05/2017   CO2 26 05/05/2017   GLUCOSE 104 (H) 05/05/2017   BUN 7 05/05/2017   CREATININE 0.90 05/05/2017   BILITOT 0.4 05/05/2017   ALKPHOS 68 05/05/2017   AST 22 05/05/2017   ALT 28 05/05/2017   PROT 7.2 05/05/2017   ALBUMIN 4.8 05/05/2017   CALCIUM 10.2 05/05/2017   ANIONGAP 10 03/21/2017   GFR 82.98 05/05/2017   Lab Results  Component Value Date   CHOL 218 (H) 05/05/2017   Lab Results  Component Value Date   HDL 42.00 05/05/2017   Lab Results  Component Value Date  [place] 155 (H) 05/05/2017   Lab Results  Component Value Date   TRIG 107.0 05/05/2017   Lab Results  Component Value Date   CHOLHDL 5 05/05/2017   No results found for: HGBA1C       Assessment & Plan:   Problem List Items Addressed This Visit    Overweight    Maintain heart healthy diet such as DASH diet, decrease po intake and increase exercise as tolerated. Needs 7-8 hours of sleep nightly.      Dysmenorrhea   Relevant Medications   Levonorgestrel-Ethinyl Estradiol (AMETHIA,CAMRESE) 0.15-0.03 &0.01 MG  tablet   Essential hypertension    Well controlled, no changes to meds. Encouraged heart healthy diet such as the DASH diet and exercise as tolerated.       Hyperlipidemia, mixed    Encouraged heart healthy diet, increase exercise, avoid trans fats, consider a krill oil cap daily      ADHD    Is tolerating Adderall XR allowed refill      Weakness of right upper extremity    No injury or obvious cause. Labs ordered if no cause found has been seeing Dr Delilah Shan of ortho and they are arranging neurology consult.       Relevant Orders   CBC   Sedimentation rate   C-reactive protein   Antinuclear Antib (ANA)   Rheumatoid Factor   B. burgdorfi antibodies   Rocky mtn spotted fvr abs pnl(IgG+IgM)   Ehrlichia antibody panel   Magnesium    Other Visit Diagnoses    Myalgia    -  Primary   Relevant Orders   T4, free   CK (Creatine Kinase)   Magnesium   Attention deficit disorder, unspecified hyperactivity presence       Arthralgia, unspecified joint       Relevant Orders   Sedimentation rate   C-reactive protein   Antinuclear Antib (ANA)   Rheumatoid Factor   B. burgdorfi antibodies   Rocky mtn spotted fvr abs pnl(IgG+IgM)   Ehrlichia antibody panel      I have discontinued Kathryn [name]'s amphetamine-dextroamphetamine, amphetamine-dextroamphetamine, and predniSONE. I am also having her maintain her albuterol, mometasone-formoterol, Dexlansoprazole, montelukast, venlafaxine XR, busPIRone, Levonorgestrel-Ethinyl Estradiol, and valACYclovir.  Meds ordered this encounter  Medications  . DISCONTD: amphetamine-dextroamphetamine (ADDERALL) 10 MG tablet    Sig: Take 1 tablet (10 mg total) by mouth daily with breakfast.    Dispense:  30 tablet    Refill:  0  . DISCONTD: amphetamine-dextroamphetamine (ADDERALL XR) 30 MG 24 hr capsule    Sig: Take 1 capsule (30 mg total) by mouth every morning.    Dispense:  30 capsule    Refill:  0  . Levonorgestrel-Ethinyl Estradiol  (AMETHIA,CAMRESE) 0.15-0.03 &0.01 MG tablet    Sig: Take 1 tablet by mouth daily.    Dispense:  3 Package    Refill:  0  . valACYclovir (VALTREX) 1000 MG tablet    Sig: 2 tab by mouth in AM and 2 tabs by mouth 12 hrs later at start of cold sore    Dispense:  60 tablet    Refill:  0    CMA served as scribe during this visit. History, Physical and Plan performed by medical provider. Documentation and orders reviewed and attested to.  Penni Homans, MD

## 2017-07-08 ENCOUNTER — Other Ambulatory Visit: Payer: Self-pay | Admitting: Family Medicine

## 2017-07-08 DIAGNOSIS — F988 Other specified behavioral and emotional disorders with onset usually occurring in childhood and adolescence: Secondary | ICD-10-CM

## 2017-07-08 NOTE — Telephone Encounter (Signed)
Copied from [place] [id number]. Topic: Quick Communication - See Telephone Encounter >> Jul 08, 2017  2:32 PM Vernona Rieger wrote: CRM for notification. See Telephone encounter for: 07/08/17.  Patient said she missed a call from the office and thinks it may have been about her labs she is needing to have done. Call back @ [phone] She also said she needs her amphetamine-dextroamphetamine (ADDERALL XR) 30 MG 24 hr capsule & amphetamine-dextroamphetamine (ADDERALL) 10 MG tablet called in because she was in the office on 5/21 and the pharmacy does not have It.   [id number] - [place], [place] - [address] AT [place]

## 2017-07-09 MED ORDER — AMPHETAMINE-DEXTROAMPHET ER 30 MG PO CP24: 30.0000 mg | ORAL_CAPSULE | ORAL | 0 refills | Status: DC

## 2017-07-09 MED ORDER — AMPHETAMINE-DEXTROAMPHETAMINE 10 MG PO TABS: 10.0000 mg | ORAL_TABLET | Freq: Every day | ORAL | 0 refills | Status: DC

## 2017-07-12 DIAGNOSIS — R29898 Other symptoms and signs involving the musculoskeletal system: Secondary | ICD-10-CM | POA: Insufficient documentation

## 2017-07-12 NOTE — Assessment & Plan Note (Signed)
Is tolerating Adderall XR allowed refill

## 2017-07-12 NOTE — Assessment & Plan Note (Signed)
No injury or obvious cause. Labs ordered if no cause found has been seeing Dr Delilah Shan of ortho and they are arranging neurology consult.

## 2017-07-12 NOTE — Assessment & Plan Note (Signed)
Maintain heart healthy diet such as DASH diet, decrease po intake and increase exercise as tolerated. Needs 7-8 hours of sleep nightly.

## 2017-07-12 NOTE — Assessment & Plan Note (Signed)
Well controlled, no changes to meds. Encouraged heart healthy diet such as the DASH diet and exercise as tolerated.  °

## 2017-07-12 NOTE — Assessment & Plan Note (Signed)
Encouraged heart healthy diet, increase exercise, avoid trans fats, consider a krill oil cap daily 

## 2017-07-13 NOTE — Telephone Encounter (Signed)
Medication sent in. 

## 2017-07-14 ENCOUNTER — Telehealth: Payer: Self-pay | Admitting: *Deleted

## 2017-07-14 ENCOUNTER — Other Ambulatory Visit (INDEPENDENT_AMBULATORY_CARE_PROVIDER_SITE_OTHER): Payer: Managed Care, Other (non HMO)

## 2017-07-14 DIAGNOSIS — M791 Myalgia, unspecified site: Secondary | ICD-10-CM

## 2017-07-14 DIAGNOSIS — R29898 Other symptoms and signs involving the musculoskeletal system: Secondary | ICD-10-CM

## 2017-07-14 DIAGNOSIS — M2550 Pain in unspecified joint: Secondary | ICD-10-CM

## 2017-07-14 NOTE — Addendum Note (Signed)
Addended by: Rockwell Germany on: 07/14/2017 01:39 PM   Modules accepted: Orders

## 2017-07-14 NOTE — Telephone Encounter (Signed)
thanks

## 2017-07-14 NOTE — Telephone Encounter (Signed)
Patient came into office and stated that her lab orders were sent to the wrong resulting agency last time and she had to pay; she was requesting help as to where she could have them drawn. She had a list on her telephone that her insurance gave her that included the [facility] that we use to have upstairs. Went and talked to Angie in lab about this. I canceled all of the 07/06/17 lab orders [total 11] and reordered as future for [facility] as the resulting agency, one had to be manually changed to [place]. I informed patient that the next time she is in for OV and/or needs to have labs drawn to always remind her provider that her orders must go to [facility] to be covered by her insurance; she understood & agreed. Pt placed on lab schedule and labs redrawn today/SLS 05/29

## 2017-07-17 LAB — CBC
HCT: 42.3 % (ref 35.0–45.0)
Hemoglobin: 14.5 g/dL (ref 11.7–15.5)
MCH: 30.8 pg (ref 27.0–33.0)
MCHC: 34.3 g/dL (ref 32.0–36.0)
MCV: 89.8 fL (ref 80.0–100.0)
MPV: 9.7 fL (ref 7.5–12.5)
Platelets: 413 10*3/uL — ABNORMAL HIGH (ref 140–400)
RBC: 4.71 10*6/uL (ref 3.80–5.10)
RDW: 11.9 % (ref 11.0–15.0)
WBC: 9 10*3/uL (ref 3.8–10.8)

## 2017-07-17 LAB — C-REACTIVE PROTEIN: CRP: 3.5 mg/L (ref <8.0)

## 2017-07-17 LAB — CK: Total CK: 105 U/L (ref 29–143)

## 2017-07-17 LAB — ROCKY MTN SPOTTED FVR ABS PNL(IGG+IGM)
RMSF IgG: NOT DETECTED
RMSF IgM: NOT DETECTED

## 2017-07-17 LAB — RHEUMATOID FACTOR: Rhuematoid fact SerPl-aCnc: <14 IU/mL (ref <14)

## 2017-07-17 LAB — T4, FREE: Free T4: 0.9 ng/dL (ref 0.8–1.8)

## 2017-07-17 LAB — EHRLICHIA ANTIBODY PANEL
E. CHAFFEENSIS AB IGG: <1:64 {titer}
E. CHAFFEENSIS AB IGM: <1:20 {titer}

## 2017-07-17 LAB — B. BURGDORFI ANTIBODIES: B burgdorferi Ab IgG+IgM: <0.9 index

## 2017-07-17 LAB — SEDIMENTATION RATE: Sed Rate: 9 mm/h (ref 0–20)

## 2017-07-17 LAB — ANA: Anti Nuclear Antibody(ANA): NEGATIVE

## 2017-07-17 LAB — MAGNESIUM: Magnesium: 2.1 mg/dL (ref 1.5–2.5)

## 2017-08-04 ENCOUNTER — Encounter

## 2017-08-04 ENCOUNTER — Telehealth: Payer: Self-pay | Admitting: Diagnostic Neuroimaging

## 2017-08-04 ENCOUNTER — Ambulatory Visit (INDEPENDENT_AMBULATORY_CARE_PROVIDER_SITE_OTHER): Payer: Managed Care, Other (non HMO) | Admitting: Diagnostic Neuroimaging

## 2017-08-04 ENCOUNTER — Encounter: Payer: Self-pay | Admitting: Diagnostic Neuroimaging

## 2017-08-04 VITALS — BP 106/74 | HR 88 | Ht 68.0 in | Wt 185.4 lb

## 2017-08-04 DIAGNOSIS — M79602 Pain in left arm: Secondary | ICD-10-CM

## 2017-08-04 DIAGNOSIS — R200 Anesthesia of skin: Secondary | ICD-10-CM

## 2017-08-04 DIAGNOSIS — M62838 Other muscle spasm: Secondary | ICD-10-CM | POA: Diagnosis not present

## 2017-08-04 DIAGNOSIS — G253 Myoclonus: Secondary | ICD-10-CM | POA: Diagnosis not present

## 2017-08-04 DIAGNOSIS — M79601 Pain in right arm: Secondary | ICD-10-CM | POA: Diagnosis not present

## 2017-08-04 NOTE — Telephone Encounter (Signed)
[employer] order sent to [facility]. They obtain the auth and will reach out to the pt to schedule.  °

## 2017-08-04 NOTE — Patient Instructions (Signed)
Thank you for coming to see [name] at [facility]. I hope we have been able to provide you high quality care today.  You may receive a patient satisfaction survey over the next few weeks. We would appreciate your feedback and comments so that we may continue to improve ourselves and the health of our patients.  - check additional testing   ~~~~~~~~~~~~~~~~~~~~~~~~~~~~~~~~~~~~~~~~~~~~~~~~~~~~~~~~~~~~~~~~~  DR. PENUMALLI'S GUIDE TO HAPPY AND HEALTHY LIVING These are some of my general health and wellness recommendations. Some of them may apply to you better than others. Please use common sense as you try these suggestions and feel free to ask me any questions.   ACTIVITY/FITNESS Mental, social, emotional and physical stimulation are very important for brain and body health. Try learning a new activity (arts, music, language, sports, games).  Keep moving your body to the best of your abilities. You can do this at home, inside or outside, the park, community center, gym or anywhere you like. Consider a physical therapist or personal trainer to get started. Fitness trackers, smart-watches or  smart-phones can help as well.   NUTRITION Eat more plants: colorful vegetables, nuts, seeds and berries.  Eat less sugar, salt, preservatives and processed foods.  Avoid toxins such as cigarettes and alcohol.  Drink water when you are thirsty. Warm water with a slice of lemon is an excellent morning drink to start the day.  Consider these websites for more information The Nutrition Source (https://www.henry-hernandez.biz/) Precision Nutrition (WindowBlog.ch)   RELAXATION Consider practicing mindfulness meditation or other relaxation techniques such as deep breathing, prayer, yoga, tai chi, massage. See website mindful.org or the apps Headspace or Calm to help get started.   SLEEP Try to get at least 7-8+ hours sleep per day. Regular  exercise and reduced caffeine will help you sleep better. Practice good sleep hygeine techniques. See website sleep.org for more information.   PLANNING Prepare estate planning, living will, healthcare POA documents. Sometimes this is best planned with the help of an attorney. Theconversationproject.org and agingwithdignity.org are excellent resources.

## 2017-08-04 NOTE — Progress Notes (Signed)
[facility]  PATIENT: Kathryn [name] DOB: [birth year]  REFERRING CLINICIAN: A Kendall HISTORY FROM: patient  REASON FOR VISIT: new consult    HISTORICAL  CHIEF COMPLAINT:  Chief Complaint  Patient presents with  . Pain    rm 7, New Pt, "pain in my right arm x 2 months; history of falls while playing sports  . Muscle spasms    HISTORY OF PRESENT ILLNESS:   22 year old female with constellation of symptoms including numbness, pain, muscle spasms, fatigue, weakness.  Patient has had long-standing issues with fatigue, muscle spasms and jerks, sleep issues, depression anxiety, since childhood.  For past 3 to 4 years patient has had pain in her right arm and wrist.  Over the past 2 months this has worsened.  Now she feels shooting pains, weakness, losing grip in her right greater than left hand and arm.  This seems to be worse when she is physically active.  Patient also has intermittent muscle twitching, typically when she is laying down or about to fall asleep.  Sometimes she has whole body jerks.  She has had whole body jerks other times, and sometimes flings objects out of her hand.  Patient denies any history of seizures.  He she has had episodes of presyncope, tunnel vision, and feeling like she might pass out without full loss of consciousness.  Recently her right hand symptoms have worsened and therefore went to orthopedic clinic for evaluation.  Patient had EMG nerve conduction study which was normal.  She had MRI of the cervical spine ordered but this was denied by insurance.  Patient also has chronic sleep deprivation.  She averages 4 to 5 hours of sleep per night, sometimes 1 to 2 hours, sometimes 7 to 8 hours.  She also takes Adderall for ADHD, and in spite of this has significant daytime fatigue.  Patient is on mood stabilizing medications which seemed to help her depression and anxiety.   REVIEW OF SYSTEMS: Full 14 system review of systems performed  and negative with exception of: Fatigue weight loss depression anxiety not enough sleep sleepiness joint pain aching muscles diarrhea.  ALLERGIES: Allergies  Allergen Reactions  . Oxycodone Nausea Only    Oxycontin also    HOME MEDICATIONS: Outpatient Medications Prior to Visit  Medication Sig Dispense Refill  . albuterol (PROVENTIL HFA;VENTOLIN HFA) 108 (90 Base) MCG/ACT inhaler Inhale 2 puffs into the lungs every 6 (six) hours as needed for wheezing or shortness of breath. 3 Inhaler 0  . amphetamine-dextroamphetamine (ADDERALL XR) 30 MG 24 hr capsule Take 1 capsule (30 mg total) by mouth every morning. 30 capsule 0  . amphetamine-dextroamphetamine (ADDERALL) 10 MG tablet Take 1 tablet (10 mg total) by mouth daily with breakfast. 30 tablet 0  . busPIRone (BUSPAR) 10 MG tablet Take 1 tablet (10 mg total) by mouth 3 (three) times daily. 90 tablet 1  . Dexlansoprazole 30 MG capsule Take 30 mg by mouth daily.    . Levonorgestrel-Ethinyl Estradiol (AMETHIA,CAMRESE) 0.15-0.03 &0.01 MG tablet Take 1 tablet by mouth daily. 3 Package 0  . mometasone-formoterol (DULERA) 100-5 MCG/ACT AERO Inhale 2 puffs into the lungs 2 (two) times daily. 3 Inhaler 1  . montelukast (SINGULAIR) 10 MG tablet Take 1 tablet (10 mg total) by mouth at bedtime. 90 tablet 1  . venlafaxine XR (EFFEXOR XR) 75 MG 24 hr capsule Take 1 capsule (75 mg total) by mouth daily with breakfast. 30 capsule 3  . valACYclovir (VALTREX) 1000 MG tablet 2 tab by  mouth in AM and 2 tabs by mouth 12 hrs later at start of cold sore (Patient not taking: Reported on 08/04/2017) 60 tablet 0   No facility-administered medications prior to visit.     PAST MEDICAL HISTORY: Past Medical History:  Diagnosis Date  . Abnormal liver function tests 02/02/2016  . ADHD 06/10/2016  . Alcohol use 02/23/2016  . Allergy   . Anxiety   . Arm fracture, right    as child, fx in three places, no surgery  . Depression   . Depression with anxiety 04/17/14    PTSD s/p death of best friend   . Essential hypertension 02/02/2016  . GERD (gastroesophageal reflux disease)   . 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 Headache 04/09/2016  . Hyperlipidemia, mixed 02/02/2016  . Migraine   . Ovarian cyst   . PCOS (polycystic ovarian syndrome)   . Preventative health care 10/27/2015  . Thrombocytosis ([place]) 04/09/2016    PAST SURGICAL HISTORY: Past Surgical History:  Procedure Laterality Date  . ADENOIDECTOMY  2002  . TYMPANOSTOMY TUBE PLACEMENT Bilateral 1999    FAMILY HISTORY: Family History  Problem Relation Age of Onset  . Hypertension Mother   . Heart disease Mother        CAD, stent at 3, s/p cardiac arrest with Defib  . Hyperlipidemia Mother   . Diabetes Mother        s/p gastric bypass  . Obesity Mother        s/p gastric bypass  . Hyperlipidemia Father   . Hypertension Father   . Asthma Sister   . Hyperlipidemia Brother   . Stroke Maternal Grandmother   . Thyroid disease Maternal Grandmother   . Heart disease Maternal Grandfather        MI first at 37, s/p stents and CABG  . Hyperlipidemia Maternal Grandfather   . Hypertension Maternal Grandfather   . Stroke Maternal Grandfather   . Cancer Maternal Grandfather        melanoma  . Aneurysm Maternal Grandfather        brain  . Kidney disease Maternal Grandfather        tumor  . Heart disease Paternal Grandmother   . Cancer Paternal Lucilla Edin and brain  . Heart disease Maternal Uncle   . Vision loss Maternal Uncle     SOCIAL HISTORY:  Social History   Socioeconomic History  . Marital status: Single    Spouse name: Not on file  . Number of children: 0  . Years of education: 52  . Highest education level: Not on file  Occupational History    Comment: [employer]  Social Needs  . Financial resource strain: Not on file  . Food insecurity:    Worry: Not on file    Inability: Not on file  . Transportation needs:    Medical: Not on file    Non-medical: Not on  file  Tobacco Use  . Smoking status: Current Some Day Smoker    Packs/day: 2.00    Types: Cigarettes  . Smokeless tobacco: Current User    Types: Snuff  Substance and Sexual Activity  . Alcohol use: No    Alcohol/week: 0.0 oz    Comment: maybe every 6 months  . Drug use: Yes    Types: Marijuana    Comment: 08/04/17 "weed daily"  . Sexual activity: Yes    Birth control/protection: Pill    Comment: lives with partner, works for  [employer], no diewtary restrictions.   Lifestyle  . Physical activity:    Days per week: Not on file    Minutes per session: Not on file  . Stress: Not on file  Relationships  . Social connections:    Talks on phone: Not on file    Gets together: Not on file    Attends religious service: Not on file    Active member of club or organization: Not on file    Attends meetings of clubs or organizations: Not on file    Relationship status: Not on file  . Intimate partner violence:    Fear of current or ex partner: Not on file    Emotionally abused: Not on file    Physically abused: Not on file    Forced sexual activity: Not on file  Other Topics Concern  . Not on file  Social History Narrative   Lives with girlfriend's family   Caffeine- soda and tea, 6 cans daily     PHYSICAL EXAM  GENERAL EXAM/CONSTITUTIONAL: Vitals:  Vitals:   08/04/17 0817  BP: 106/74  Pulse: 88  Weight: 185 lb 6.4 oz (84.1 kg)  Height: 5\' 8"  (1.727 m)     Body mass index is 28.19 kg/m.  Visual Acuity Screening   Right eye Left eye Both eyes  Without correction:     With correction: 20/30 20/30   Comments: contacts    Patient is in no distress; well developed, nourished and groomed; neck is supple  CARDIOVASCULAR:  Examination of carotid arteries is normal; no carotid bruits  Regular rate and rhythm, no murmurs  Examination of peripheral vascular system by observation and palpation is normal  EYES:  Ophthalmoscopic exam of optic discs and  posterior segments is normal; no papilledema or hemorrhages  MUSCULOSKELETAL:  Gait, strength, tone, movements noted in Neurologic exam below  NEUROLOGIC: MENTAL STATUS:  No flowsheet data found.  awake, alert, oriented to person, place and time  recent and remote memory intact  normal attention and concentration  language fluent, comprehension intact, naming intact,   fund of knowledge appropriate  CRANIAL NERVE:   2nd - no papilledema on fundoscopic exam  2nd, 3rd, 4th, 6th - pupils 5MM equal and reactive to light, visual fields full to confrontation, extraocular muscles intact, no nystagmus  5th - facial sensation symmetric  7th - facial strength symmetric  8th - hearing intact  9th - palate elevates symmetrically, uvula midline  11th - shoulder shrug symmetric  12th - tongue protrusion midline  MOTOR:   normal bulk and tone, full strength in the BUE, BLE  EXCEPT POSTURAL TREMOR IN BUE  SENSORY:   normal and symmetric to light touch, temperature, vibration  COORDINATION:   finger-nose-finger, fine finger movements normal; EXCEPT SLIGHT INTENTION TREMOR IN BUE  REFLEXES:   deep tendon reflexes present and symmetric; BRISK AT KNEES  GAIT/STATION:   narrow based gait; able to walk on toes, heels and tandem; romberg is negative    DIAGNOSTIC DATA (LABS, IMAGING, TESTING) - I reviewed patient records, labs, notes, testing and imaging myself where available.  Lab Results  Component Value Date   WBC 9.0 07/14/2017   HGB 14.5 07/14/2017   HCT 42.3 07/14/2017   MCV 89.8 07/14/2017   PLT 413 (H) 07/14/2017      Component Value Date/Time   NA 146 (H) 05/05/2017 1103   K 4.0 05/05/2017 1103   CL 107 05/05/2017 1103   CO2 26 05/05/2017 1103  GLUCOSE 104 (H) 05/05/2017 1103   BUN 7 05/05/2017 1103   CREATININE 0.90 05/05/2017 1103   CALCIUM 10.2 05/05/2017 1103   PROT 7.2 05/05/2017 1103   ALBUMIN 4.8 05/05/2017 1103   AST 22 05/05/2017 1103     ALT 28 05/05/2017 1103   ALKPHOS 68 05/05/2017 1103   BILITOT 0.4 05/05/2017 1103   GFRNONAA >60 03/21/2017 1235   GFRAA >60 03/21/2017 1235   Lab Results  Component Value Date   CHOL 218 (H) 05/05/2017   HDL 42.00 05/05/2017   LDLCALC 155 (H) 05/05/2017   LDLDIRECT 138.0 01/22/2016   TRIG 107.0 05/05/2017   CHOLHDL 5 05/05/2017   No results found for: HGBA1C No results found for: VITAMINB12 Lab Results  Component Value Date   TSH 2.49 05/05/2017   Lab Results  Component Value Date   ANA NEGATIVE 07/14/2017   Lab Results  Component Value Date   CKTOTAL 105 07/14/2017     07/01/17 EMG/NCS - normal     ASSESSMENT AND PLAN  22 y.o. year old female here with constellation of symptoms including numbness, shooting pain, muscle spasms, intermittent weakness, fatigue, insomnia, for more than 5 years, but worsening in the last 2 to 3 months.  Symptoms could be related to sleep deprivation, depression anxiety, overexertion versus an underlying neurologic conditions such as demyelinating disease, neuromuscular junction disorder or polyradiculopathy.  We will proceed with further work-up.   Ddx: CNS autoimmune, inflamm, neuromuscular junction d/o, sleep deprivation, metabolic  1. Numbness   2. Muscle spasm   3. Pain in both upper extremities   4. Myoclonus      PLAN:  - check additional testing - encouraged patient to optimize sleep, nutrition and exercise - returnto be scheduled pending test results  Orders Placed This Encounter  Procedures  . MR BRAIN W WO CONTRAST  . MR CERVICAL SPINE W WO CONTRAST  . Vitamin B12  . Hemoglobin A1c  . Acetylcholine Receptor, Binding  . VITAMIN D 25 Hydroxy (Vit-D Deficiency, Fractures)  . EEG adult   Return if symptoms worsen or fail to improve, for pending test results.    Penni Bombard, MD 03/08/9756, 8:32 AM Certified in Neurology, Neurophysiology and Neuroimaging  [facility] [address], [place], [place] [address] [phone]

## 2017-08-05 ENCOUNTER — Other Ambulatory Visit: Payer: Self-pay | Admitting: Family Medicine

## 2017-08-05 DIAGNOSIS — F988 Other specified behavioral and emotional disorders with onset usually occurring in childhood and adolescence: Secondary | ICD-10-CM

## 2017-08-05 LAB — HEMOGLOBIN A1C
Est. average glucose Bld gHb Est-mCnc: 111 mg/dL
Hgb A1c MFr Bld: 5.5 % (ref 4.8–5.6)

## 2017-08-05 LAB — VITAMIN B12: Vitamin B-12: 319 pg/mL (ref 232–1245)

## 2017-08-05 LAB — VITAMIN D 25 HYDROXY (VIT D DEFICIENCY, FRACTURES): Vit D, 25-Hydroxy: 26.4 ng/mL — ABNORMAL LOW (ref 30.0–100.0)

## 2017-08-05 LAB — ACETYLCHOLINE RECEPTOR, BINDING: AChR Binding Ab, Serum: <0.03 nmol/L (ref 0.00–0.24)

## 2017-08-05 NOTE — Telephone Encounter (Signed)
Copied from [place] [id number]. Topic: Quick Communication - Rx Refill/Question >> Aug 05, 2017  3:13 PM Boyd Kerbs wrote: Medication:  amphetamine-dextroamphetamine (ADDERALL XR) 30 MG 24 hr capsule amphetamine-dextroamphetamine (ADDERALL) 10 MG tablet  Pt. Was on antibiotic and now has yeast infection, asking if can call in prescription for this She is needing this asap   Has the patient contacted their pharmacy? Yes.   (Agent: If no, request that the patient contact the pharmacy for the refill.) (Agent: If yes, when and what did the pharmacy advise?)   Preferred Pharmacy (with phone number or street name): [id number] - [place], [address] [place] [address] Phone: [phone] Fax: [phone]    Agent: Please be advised that RX refills may take up to 3 business days. We ask that you follow-up with your pharmacy.

## 2017-08-06 ENCOUNTER — Telehealth: Payer: Self-pay | Admitting: Neurology

## 2017-08-06 NOTE — Telephone Encounter (Signed)
Called the patient to review lab work. No answer. Left detailed message informing the patient of results. Instructed the patient to call back with questions.

## 2017-08-06 NOTE — Telephone Encounter (Signed)
Rx refill request: Adderall XR 30 mg   Last filled: 07/09/17 #30                            Adderall 10 mg         Last filled: 07/09/17 #30  Patient is requesting a yeast medication- recent ATB use. Attempted to call patient- no answer.  LOV: 07/06/17  PCP: [place] [place], [place]

## 2017-08-06 NOTE — Telephone Encounter (Signed)
-----   Message from Penni Bombard, MD sent at 08/06/2017 12:25 PM EDT ----- Unremarkable labs, except borderline low vit D. Continue current plan. Please call patient. -VRP

## 2017-08-09 NOTE — Telephone Encounter (Signed)
OK to fill 3 month supply and needs follow up by Oct or Nov

## 2017-08-10 MED ORDER — AMPHETAMINE-DEXTROAMPHET ER 30 MG PO CP24: 30.0000 mg | ORAL_CAPSULE | Freq: Every day | ORAL | 0 refills | Status: DC

## 2017-08-10 MED ORDER — AMPHETAMINE-DEXTROAMPHET ER 30 MG PO CP24: 30.0000 mg | ORAL_CAPSULE | ORAL | 0 refills | Status: DC

## 2017-08-10 MED ORDER — AMPHETAMINE-DEXTROAMPHETAMINE 10 MG PO TABS: 10.0000 mg | ORAL_TABLET | Freq: Every day | ORAL | 0 refills | Status: DC

## 2017-08-10 NOTE — Telephone Encounter (Signed)
I have refilled for a month but she will need an appt no later than November of 2019 to stay on med

## 2017-08-11 ENCOUNTER — Other Ambulatory Visit: Payer: Self-pay

## 2017-08-11 DIAGNOSIS — F418 Other specified anxiety disorders: Secondary | ICD-10-CM

## 2017-08-11 MED ORDER — BUSPIRONE HCL 10 MG PO TABS: 10.0000 mg | ORAL_TABLET | Freq: Three times a day (TID) | ORAL | 1 refills | Status: DC

## 2017-08-13 NOTE — Telephone Encounter (Signed)
Sent pt message via mychart

## 2017-08-17 ENCOUNTER — Ambulatory Visit (INDEPENDENT_AMBULATORY_CARE_PROVIDER_SITE_OTHER): Payer: Managed Care, Other (non HMO) | Admitting: Diagnostic Neuroimaging

## 2017-08-17 ENCOUNTER — Telehealth: Payer: Self-pay | Admitting: Family Medicine

## 2017-08-17 DIAGNOSIS — R252 Cramp and spasm: Secondary | ICD-10-CM

## 2017-08-17 DIAGNOSIS — R200 Anesthesia of skin: Secondary | ICD-10-CM

## 2017-08-17 DIAGNOSIS — M62838 Other muscle spasm: Secondary | ICD-10-CM

## 2017-08-17 DIAGNOSIS — M79602 Pain in left arm: Secondary | ICD-10-CM

## 2017-08-17 DIAGNOSIS — G253 Myoclonus: Secondary | ICD-10-CM

## 2017-08-17 DIAGNOSIS — M79601 Pain in right arm: Secondary | ICD-10-CM

## 2017-08-17 NOTE — Telephone Encounter (Signed)
Returned patients call. Advised we could get her scheduled for office visit regarding her issue with medication. Left message for patient to return call to schedule appointment. Advised Dr. Charlett Blake may not be available but we could get her in to see another provider.

## 2017-08-17 NOTE — Telephone Encounter (Signed)
Copied from [place] [id number]. Topic: Inquiry >> Aug 17, 2017 10:57 AM Kathryn [name], NT wrote: Reason for CRM: Patient called and and said here medication for depression is not working ,there is no time in her pcp s schedule please advise

## 2017-08-20 ENCOUNTER — Encounter: Payer: Self-pay | Admitting: Family Medicine

## 2017-08-20 ENCOUNTER — Ambulatory Visit (INDEPENDENT_AMBULATORY_CARE_PROVIDER_SITE_OTHER): Payer: Managed Care, Other (non HMO) | Admitting: Family Medicine

## 2017-08-20 VITALS — BP 126/76 | HR 78 | Temp 98.2°F | Resp 18 | Wt 182.8 lb

## 2017-08-20 DIAGNOSIS — F418 Other specified anxiety disorders: Secondary | ICD-10-CM

## 2017-08-20 DIAGNOSIS — F419 Anxiety disorder, unspecified: Secondary | ICD-10-CM | POA: Diagnosis not present

## 2017-08-20 DIAGNOSIS — R443 Hallucinations, unspecified: Secondary | ICD-10-CM | POA: Diagnosis not present

## 2017-08-20 DIAGNOSIS — F339 Major depressive disorder, recurrent, unspecified: Secondary | ICD-10-CM | POA: Diagnosis not present

## 2017-08-20 DIAGNOSIS — I10 Essential (primary) hypertension: Secondary | ICD-10-CM

## 2017-08-20 NOTE — Patient Instructions (Signed)
Obsessive-Compulsive Disorder Obsessive-compulsive disorder (OCD) is a brain-based anxiety disorder. People with OCD have obsessions, compulsions, or both. Obsessions are unwanted and distressing thoughts, ideas, or urges that keep entering your mind and result in anxiety. You may find yourself trying to ignore them. You may try to stop or undo them with a compulsion. Compulsions are repetitive physical or mental acts that you feel you have to do. They may reduce or prevent any emotional distress, but in most instances, they are ineffective. Compulsions can be very time-consuming, often taking more than one hour each day. They can interfere with personal relationships and normal activities at home, school, or work. OCD can begin in childhood, but it usually starts in young adulthood and continues throughout life. Many people with OCD also have depression or another mental health disorder. What are the causes? The cause of this condition is not known. What increases the risk? This condition is more like to develop in: People who have experienced trauma. People who have a family history of OCD. Women during and after pregnancy. People who have infections and post-infectious autoimmune syndrome. People who have other mental health conditions. People who abuse substances.  What are the signs or symptoms? Symptoms of OCD include compulsions and obsessions. People with obsessions usually have a fear that something terrible will happen or that they will do something terrible. Examples of common obsessions include: Fear of contamination with germs, waste, or poisonous substances. Fear of making the wrong decision. Violent or sexual thoughts or urges towards others. Need for symmetry or exactness.  Examples of common compulsions include: Excessive handwashing or bathing due to fear of contamination. Checking things over and over to make sure you finished a task, such as making sure you locked a door  or unplugged a toaster. Repeating an act or phrase over and over, sometimes a specific number of times, until it feels right. Arranging and rearranging objects to keep them in a certain order. Having a very hard time making a decision and sticking to it.  How is this diagnosed? OCD is diagnosed through an assessment by your health care provider. Your health care provider will ask questions about any obsessions or compulsions you have and how they affect your life. Your health care provider may also ask about your medical history, prescription medicines, and drug use. Certain medical conditions and substances can cause symptoms that are similar to OCD. Your health care provider may also refer you to a mental health specialist. How is this treated? Treatment may include: Cognitive therapy. This is a form of talk therapy. The goal is to identify and change the irrational thoughts associated with obsessions. Behavioral therapy. A type of behavioral therapy called exposure and response prevention is often used. In this therapy, you will be exposed to the distressing situation that triggers your compulsion and be prevented from responding to it. With repetition of this process over time, you will no longer feel the distress or need to perform the compulsion. Self-soothing. Meditation, deep breathing, or yoga can help you manage the physiological symptoms of anxiety and can help with how you think. Medicine. Certain types of antidepressant medicine may help reduce or control OCD symptoms. Medicine is most effective when used with cognitive or behavioral therapy.  Treatment usually involves a combination of therapy and medicines. For severe OCD that does not respond to talk therapy and medicine, brain surgery or electrical stimulation of specific areas of the brain (deep brain stimulation) may be considered. Follow these instructions at  home: Take over-the-counter and prescription medicines only as told by  your health care provider. Do not start taking any new medicines with approval from your health care provider. Consider joining a support group for people with OCD. Keep all follow-up visits as told by your health care provider. This is important. Contact a health care provider if: You are not able to take your medicines as prescribed. Your symptoms get worse. Get help right away if: You have suicidal thoughts or thoughts about hurting yourself or others. If you ever feel like you may hurt yourself or others, or have thoughts about taking your own life, get help right away. You can go to your nearest emergency department or call: Your local emergency services (911 in the [place] A suicide crisis helpline, such as the [facility] at 9177133043. This is open 24-hours a day.  Summary Obsessive-compulsive disorder (OCD) is a brain-based anxiety disorder. People with OCD have obsessions, compulsions, or both. OCD can interfere with personal relationships and normal activities at home, school, or work. Treatment usually involves a combination of therapy and medicines. This information is not intended to replace advice given to you by your health care provider. Make sure you discuss any questions you have with your health care provider. Document Released: 01/27/2001 Document Revised: 11/18/2015 Document Reviewed: 11/18/2015 Elsevier Interactive Patient Education  2018 [place] [employer] Disorder Schizoaffective disorder (ScAD) is a mental illness. It causes symptoms that are a mixture of a psychotic disorder (schizophrenia) and a mood (affective) disorder. A psychotic disorder involves losing touch with reality. ScAD usually occurs in cycles. Periods of severe symptoms may be followed by periods of less severe symptoms or improvement. The illness affects men and women equally, but it usually appears at an earlier age (teenage or early adult years) in men. People  who have family members with schizophrenia, bipolar disorder, or ScAD are at higher risk of developing ScAD. ScAD may interfere with personal relationships or normal daily activities. People with ScAD are at a higher risk for:  Job loss.  Social aloneness (isolation).  Health problems.  Anxiety.  Substance use disorders.  Suicide.  What are the causes? The exact cause of this condition is not known. What increases the risk? The following factors may make you more likely to develop this condition:  Problems during your mother's pregnancy and after your birth, such as: ? Your mother having the flu (influenza) during the second semester of the pregnancy. ? Exposure to drugs, alcohol, illnesses, or other poisons (toxins) before birth. ? Low birth weight.  A brain infection or viral infection.  Problems with brain structure or function.  Having family members with bipolar disorder, ScAD, or schizophrenia.  Substance abuse.  Having been diagnosed with a mental health condition in the past.  Being a victim of neglect or long-term (chronic) abuse.  What are the signs or symptoms? At any one time, people with ScAD may have psychotic symptoms only or have both psychotic and affective symptoms. Psychotic symptoms may include:  Hearing, seeing, or feeling things that are not there (hallucinations).  Having fixed, false beliefs (delusions). The delusions usually include being attacked, harassed, or plotted against (paranoid delusions).  Speaking in a way that makes no sense to others (disorganized speech).  Confusing or odd behavior.  Loss of motivation for normal daily activities, such as self-care.  Withdrawal from social contacts (social isolation).  Lack of emotions.  Affective symptoms may include:  Symptoms similar to major depression, such as: ?  Depressed mood. ? Loss of interest in activities that are usually pleasurable (anhedonia). ? Sleeping more or less than  normal. ? Feeling worthless or excessively guilty. ? Lack of energy or motivation. ? Trouble concentrating. ? Eating more or less than usual. ? Thinking a lot about death or suicide.  Symptoms similar to bipolar mania. Bipolar mania refers to periods of severe elation, irritability, and high energy that are experienced by people who have bipolar disorder. These symptoms may include: ? Abnormally elevated or irritable mood. ? Abnormally increased energy or activity. ? More confidence than normal or feeling that you are able to do anything (grandiosity). ? Feeling rested after getting less sleep than normal. ? Being easily distracted. ? Talking more than usual or feeling pressure to keep talking. ? Feeling that your thoughts are racing. ? Engaging in high-risk activities.  How is this diagnosed? ScAD is diagnosed through an assessment by your health care provider. Your health care provider may refer you to a mental health specialist for evaluation. The mental health specialist:  Will observe and ask questions about your thoughts, behavior, mood, and ability to function in daily life.  May ask questions about your medical history and use of drugs, including prescription medicines. Certain medical conditions and substances can cause symptoms that resemble ScAD.  May do blood tests and imaging tests.  There are two types of ScAD:  Depressive ScAD. This type is diagnosed when you have only depressive symptoms.  Bipolar ScAD. This type is diagnosed if your affective symptoms are only manic or are a mixture of manic and depressive.  How is this treated? ScAD is usually a lifelong (chronic) illness that requires long-term treatment. Treatment may include:  Medicine. Different types of medicine are used to treat ScAD. The exact combination depends on the type and severity of your symptoms. ? Antipsychotic medicine may be used to control psychotic symptoms such as delusions, paranoia, and  hallucinations. ? Mood stabilizers may be used to balance the highs and lows of bipolar manic mood swings. ? Antidepressant medicines may be used to treat depressive symptoms.  Counseling or talk therapy. Individual, group, or family counseling may be helpful in providing education, support, and guidance. Many people with ScAD also benefit from social skills and job skills (vocational) training.  A combination of medicine and counseling is usually best for managing the disorder over time. A procedure in which electricity is applied to the brain through the scalp (electroconvulsive therapy) may be used to treat people with severe manic symptoms who do not respond to medicine and counseling. Follow these instructions at home:  Take over-the-counter and prescription medicines only as told by your health care provider. Check with your health care provider before starting new medicines.  Surround yourself with people who care about you and can help you manage your condition.  Keep stress under control. Stress may make symptoms worse.  Avoid alcohol and drugs. They can affect how medicine works and make symptoms worse.  Keep all follow-up visits as told by your health care provider and counselor. This is important. Contact a health care provider if:  You are not able to take your medicines as prescribed.  Your symptoms get worse. Get help right away if:  You feel out of control.  You or others notice warning signs of suicide, such as: ? Increased use of drugs or alcohol ? Expressing feelings of not having a purpose in life or feeling trapped, guilty, anxious, agitated, or hopeless. ? Withdrawing from  friends and family. ? Showing uncontrolled anger, recklessness, and dramatic mood changes. ? Talking about suicide or searching for methods. If you ever feel like you may hurt yourself or others, or have thoughts about taking your own life, get help right away. You can go to your nearest  emergency department or call:  Your local emergency services (911 in the [place]  A suicide crisis helpline, such as the [facility] at 910 594 7563. This is open 24 hours a day.  Summary  Schizoaffective disorder causes symptoms that are a mixture of a psychotic disorder and a mood disorder.  A combination of medicine and counseling is usually best for managing the disorder over time.  People who have schizoaffective disorder are at risk for suicide. Get help right away if you or someone else notices warning signs of suicide. This information is not intended to replace advice given to you by your health care provider. Make sure you discuss any questions you have with your health care provider. Document Released: 06/15/2006 Document Revised: 11/15/2015 Document Reviewed: 11/15/2015 Elsevier Interactive Patient Education  [employer].

## 2017-08-20 NOTE — Progress Notes (Signed)
Subjective:  I acted as a Education administrator for Dr. Charlett Blake. Kathryn [name], Kathryn [name]  Patient ID: Kathryn [name], female    DOB: December 02, 1995, 22 y.o.   MRN: [id number]  Chief Complaint  Patient presents with  . Anxiety    HPI  Patient is in today for an acute visit to readjust medications for her mental health concerns. Over the years she has been diagnosed with anxiety, depression, ADD, PTSD and bipolar has been suggested. She feels stable on current meds and while she endorses anxiety and anhedonia she denies suicidal or homicidal ideation. What is new today is that she is also endorsing long history of several personalities/voices in her head with some auditory and visual hallucinations and some OCD checking behaviors. Denies CP/palp/SOB/HA/congestion/fevers/GI or GU c/o. Taking meds as prescribed  Patient Care Team: Mosie Lukes, MD as PCP - General (Family Medicine)   Past Medical History:  Diagnosis Date  . Abnormal liver function tests 02/02/2016  . ADHD 06/10/2016  . Alcohol use 02/23/2016  . Allergy   . Anxiety   . Arm fracture, right    as child, fx in three places, no surgery  . Depression   . Depression with anxiety April 23, 2014   PTSD s/p death of best friend   . Essential hypertension 02/02/2016  . GERD (gastroesophageal reflux disease)   . 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 Headache 04/09/2016  . Hyperlipidemia, mixed 02/02/2016  . Migraine   . Ovarian cyst   . PCOS (polycystic ovarian syndrome)   . Preventative health care 10/27/2015  . Thrombocytosis ([place]) 04/09/2016    Past Surgical History:  Procedure Laterality Date  . ADENOIDECTOMY  2002  . TYMPANOSTOMY TUBE PLACEMENT Bilateral 1999    Family History  Problem Relation Age of Onset  . Hypertension Mother   . Heart disease Mother        CAD, stent at 82, s/p cardiac arrest with Defib  . Hyperlipidemia Mother   . Diabetes Mother        s/p gastric bypass  . Obesity Mother        s/p gastric bypass  . Hyperlipidemia Father   .  Hypertension Father   . Asthma Sister   . Hyperlipidemia Brother   . Stroke Maternal Grandmother   . Thyroid disease Maternal Grandmother   . Heart disease Maternal Grandfather        MI first at 92, s/p stents and CABG  . Hyperlipidemia Maternal Grandfather   . Hypertension Maternal Grandfather   . Stroke Maternal Grandfather   . Cancer Maternal Grandfather        melanoma  . Aneurysm Maternal Grandfather        brain  . Kidney disease Maternal Grandfather        tumor  . Heart disease Paternal Grandmother   . Cancer Paternal Lucilla Edin and brain  . Heart disease Maternal Uncle   . Vision loss Maternal Uncle     Social History   Socioeconomic History  . Marital status: Single    Spouse name: Not on file  . Number of children: 0  . Years of education: 36  . Highest education level: Not on file  Occupational History    Comment: [employer]  Social Needs  . Financial resource strain: Not on file  . Food insecurity:    Worry: Not on file    Inability: Not on file  . Transportation needs:    Medical: Not  on file    Non-medical: Not on file  Tobacco Use  . Smoking status: Current Some Day Smoker    Packs/day: 2.00    Types: Cigarettes  . Smokeless tobacco: Current User    Types: Snuff  Substance and Sexual Activity  . Alcohol use: No    Alcohol/week: 0.0 oz    Comment: maybe every 6 months  . Drug use: Yes    Types: Marijuana    Comment: 08/04/17 "weed daily"  . Sexual activity: Yes    Birth control/protection: Pill    Comment: lives with partner, works for [employer], no diewtary restrictions.   Lifestyle  . Physical activity:    Days per week: Not on file    Minutes per session: Not on file  . Stress: Not on file  Relationships  . Social connections:    Talks on phone: Not on file    Gets together: Not on file    Attends religious service: Not on file    Active member of club or organization: Not on file    Attends meetings of clubs  or organizations: Not on file    Relationship status: Not on file  . Intimate partner violence:    Fear of current or ex partner: Not on file    Emotionally abused: Not on file    Physically abused: Not on file    Forced sexual activity: Not on file  Other Topics Concern  . Not on file  Social History Narrative   Lives with girlfriend's family   Caffeine- soda and tea, 6 cans daily    Outpatient Medications Prior to Visit  Medication Sig Dispense Refill  . albuterol (PROVENTIL HFA;VENTOLIN HFA) 108 (90 Base) MCG/ACT inhaler Inhale 2 puffs into the lungs every 6 (six) hours as needed for wheezing or shortness of breath. 3 Inhaler 0  . amphetamine-dextroamphetamine (ADDERALL XR) 30 MG 24 hr capsule Take 1 capsule (30 mg total) by mouth every morning. August 2019 30 capsule 0  . amphetamine-dextroamphetamine (ADDERALL XR) 30 MG 24 hr capsule Take 1 capsule (30 mg total) by mouth daily. June 2019 30 capsule 0  . amphetamine-dextroamphetamine (ADDERALL XR) 30 MG 24 hr capsule Take 1 capsule (30 mg total) by mouth every morning. July 2019 30 capsule 0  . amphetamine-dextroamphetamine (ADDERALL) 10 MG tablet Take 1 tablet (10 mg total) by mouth daily with breakfast. June 2019 30 tablet 0  . amphetamine-dextroamphetamine (ADDERALL) 10 MG tablet Take 1 tablet (10 mg total) by mouth daily with breakfast. August 2019 30 tablet 0  . amphetamine-dextroamphetamine (ADDERALL) 10 MG tablet Take 1 tablet (10 mg total) by mouth daily with breakfast. July 2019 30 tablet 0  . busPIRone (BUSPAR) 10 MG tablet Take 1 tablet (10 mg total) by mouth 3 (three) times daily. 90 tablet 1  . Dexlansoprazole 30 MG capsule Take 30 mg by mouth daily.    . Levonorgestrel-Ethinyl Estradiol (AMETHIA,CAMRESE) 0.15-0.03 &0.01 MG tablet Take 1 tablet by mouth daily. 3 Package 0  . mometasone-formoterol (DULERA) 100-5 MCG/ACT AERO Inhale 2 puffs into the lungs 2 (two) times daily. 3 Inhaler 1  . montelukast (SINGULAIR) 10 MG  tablet Take 1 tablet (10 mg total) by mouth at bedtime. 90 tablet 1  . valACYclovir (VALTREX) 1000 MG tablet 2 tab by mouth in AM and 2 tabs by mouth 12 hrs later at start of cold sore (Patient not taking: Reported on 08/04/2017) 60 tablet 0  . venlafaxine XR (EFFEXOR XR) 75 MG 24  hr capsule Take 1 capsule (75 mg total) by mouth daily with breakfast. 30 capsule 3   No facility-administered medications prior to visit.     Allergies  Allergen Reactions  . Oxycodone Nausea Only    Oxycontin also    Review of Systems  Constitutional: Negative for fever and malaise/fatigue.  HENT: Negative for congestion.   Eyes: Negative for blurred vision.  Respiratory: Negative for cough and shortness of breath.   Cardiovascular: Negative for chest pain, palpitations and leg swelling.  Gastrointestinal: Negative for vomiting.  Musculoskeletal: Negative for back pain.  Skin: Negative for rash.  Neurological: Negative for loss of consciousness and headaches.  Psychiatric/Behavioral: Positive for depression, hallucinations and memory loss. Negative for suicidal ideas. The patient is nervous/anxious.        Objective:    Physical Exam  Constitutional: She is oriented to person, place, and time. She appears well-developed and well-nourished. No distress.  HENT:  Head: Normocephalic and atraumatic.  Eyes: Conjunctivae are normal.  Neck: Normal range of motion. No thyromegaly present.  Cardiovascular: Normal rate and regular rhythm.  Pulmonary/Chest: Effort normal and breath sounds normal. She has no wheezes.  Abdominal: Soft. Bowel sounds are normal. There is no tenderness.  Musculoskeletal: Normal range of motion. She exhibits no edema or deformity.  Neurological: She is alert and oriented to person, place, and time.  Skin: Skin is warm and dry. She is not diaphoretic.  Psychiatric: She has a normal mood and affect.    BP 126/76 (BP Location: Left Arm, Patient Position: Sitting, Cuff Size: Normal)    Pulse 78   Temp 98.2 F (36.8 C) (Oral)   Resp 18   Wt 182 lb 12.8 oz (82.9 kg)   SpO2 98%   BMI 27.79 kg/m  Wt Readings from Last 3 Encounters:  08/20/17 182 lb 12.8 oz (82.9 kg)  08/04/17 185 lb 6.4 oz (84.1 kg)  07/06/17 185 lb 6.4 oz (84.1 kg)   BP Readings from Last 3 Encounters:  08/20/17 126/76  08/04/17 106/74  07/06/17 122/80     Immunization History  Administered Date(s) Administered  . Influenza,inj,Quad PF,6+ Mos 10/13/2016  . Influenza-Unspecified 12/01/2014, 12/30/2015    Health Maintenance  Topic Date Due  . HIV Screening  01/11/2010  . TETANUS/TDAP  01/11/2014  . PAP SMEAR  01/12/2016  . INFLUENZA VACCINE  09/16/2017  . CHLAMYDIA SCREENING  03/21/2018    Lab Results  Component Value Date   WBC 9.0 07/14/2017   HGB 14.5 07/14/2017   HCT 42.3 07/14/2017   PLT 413 (H) 07/14/2017   GLUCOSE 104 (H) 05/05/2017   CHOL 218 (H) 05/05/2017   TRIG 107.0 05/05/2017   HDL 42.00 05/05/2017   LDLDIRECT 138.0 01/22/2016   LDLCALC 155 (H) 05/05/2017   ALT 28 05/05/2017   AST 22 05/05/2017   NA 146 (H) 05/05/2017   K 4.0 05/05/2017   CL 107 05/05/2017   CREATININE 0.90 05/05/2017   BUN 7 05/05/2017   CO2 26 05/05/2017   TSH 2.49 05/05/2017   HGBA1C 5.5 08/04/2017    Lab Results  Component Value Date   TSH 2.49 05/05/2017   Lab Results  Component Value Date   WBC 9.0 07/14/2017   HGB 14.5 07/14/2017   HCT 42.3 07/14/2017   MCV 89.8 07/14/2017   PLT 413 (H) 07/14/2017   Lab Results  Component Value Date   NA 146 (H) 05/05/2017   K 4.0 05/05/2017   CO2 26 05/05/2017   GLUCOSE  104 (H) 05/05/2017   BUN 7 05/05/2017   CREATININE 0.90 05/05/2017   BILITOT 0.4 05/05/2017   ALKPHOS 68 05/05/2017   AST 22 05/05/2017   ALT 28 05/05/2017   PROT 7.2 05/05/2017   ALBUMIN 4.8 05/05/2017   CALCIUM 10.2 05/05/2017   ANIONGAP 10 03/21/2017   GFR 82.98 05/05/2017   Lab Results  Component Value Date   CHOL 218 (H) 05/05/2017   Lab Results    Component Value Date   HDL 42.00 05/05/2017   Lab Results  Component Value Date   LDLCALC 155 (H) 05/05/2017   Lab Results  Component Value Date   TRIG 107.0 05/05/2017   Lab Results  Component Value Date   CHOLHDL 5 05/05/2017   Lab Results  Component Value Date   HGBA1C 5.5 08/04/2017         Assessment & Plan:   Problem List Items Addressed This Visit    Depression with anxiety    Has struggled with these conditions but also endorsing long history of several personalities/voices in her head with some auditory and visual hallucinations and some OCD checking behaviors. No change in meds today as she does not feel as if she is in any danger, she is referred to psychiatry for further care. Counseled for 20 minutes of a 25 minute appt      Essential hypertension    Well controlled, no changes to meds. Encouraged heart healthy diet such as the DASH diet and exercise as tolerated.        Other Visit Diagnoses    Hallucinations    -  Primary   Relevant Orders   Ambulatory referral to Psychiatry   Depression, recurrent ([place])       Relevant Orders   Ambulatory referral to Psychiatry   Anxiety       Relevant Orders   Ambulatory referral to Psychiatry      I am having [name] maintain her albuterol, mometasone-formoterol, Dexlansoprazole, montelukast, venlafaxine XR, Levonorgestrel-Ethinyl Estradiol, valACYclovir, amphetamine-dextroamphetamine, amphetamine-dextroamphetamine, amphetamine-dextroamphetamine, amphetamine-dextroamphetamine, amphetamine-dextroamphetamine, amphetamine-dextroamphetamine, and busPIRone.  No orders of the defined types were placed in this encounter.   CMA served as Education administrator during this visit. History, Physical and Plan performed by medical provider. Documentation and orders reviewed and attested to.  Penni Homans, MD

## 2017-08-20 NOTE — Assessment & Plan Note (Signed)
Has struggled with these conditions but also endorsing long history of several personalities/voices in her head with some auditory and visual hallucinations and some OCD checking behaviors. No change in meds today as she does not feel as if she is in any danger, she is referred to psychiatry for further care. Counseled for 20 minutes of a 25 minute appt

## 2017-08-20 NOTE — Assessment & Plan Note (Signed)
Well controlled, no changes to meds. Encouraged heart healthy diet such as the DASH diet and exercise as tolerated.  °

## 2017-08-25 ENCOUNTER — Other Ambulatory Visit: Payer: Self-pay | Admitting: Family Medicine

## 2017-08-25 DIAGNOSIS — F418 Other specified anxiety disorders: Secondary | ICD-10-CM

## 2017-08-27 ENCOUNTER — Ambulatory Visit
Admission: RE | Admit: 2017-08-27 | Discharge: 2017-08-27 | Disposition: A | Discharge status: Home / Self Care | Payer: Managed Care, Other (non HMO) | Source: Ambulatory Visit | Attending: Diagnostic Neuroimaging | Admitting: Diagnostic Neuroimaging

## 2017-08-27 DIAGNOSIS — R200 Anesthesia of skin: Secondary | ICD-10-CM

## 2017-08-27 DIAGNOSIS — M79601 Pain in right arm: Secondary | ICD-10-CM

## 2017-08-27 DIAGNOSIS — M79602 Pain in left arm: Secondary | ICD-10-CM

## 2017-08-27 DIAGNOSIS — G253 Myoclonus: Secondary | ICD-10-CM | POA: Diagnosis not present

## 2017-08-27 DIAGNOSIS — M62838 Other muscle spasm: Secondary | ICD-10-CM

## 2017-08-27 MED ORDER — GADOBENATE DIMEGLUMINE 529 MG/ML IV SOLN
17.0000 mL | Freq: Once | INTRAVENOUS | Status: AC | PRN
  Administered 2017-08-27: 17 mL via INTRAVENOUS

## 2017-08-27 NOTE — Procedures (Signed)
   [facility]  EEG (ELECTROENCEPHALOGRAM) REPORT   STUDY DATE: 08/17/17 PATIENT NAME: Kathryn [name] DOB: [birth year] MRN: [id number]  ORDERING CLINICIAN: Andrey Spearman, MD   TECHNOLOGIST: Arelia Longest TECHNIQUE: Electroencephalogram was recorded utilizing standard 10-20 system of lead placement and reformatted into average and bipolar montages.  RECORDING TIME: 20 minutes  ACTIVATION: hyperventilation and photic stimulation  CLINICAL INFORMATION: 22 year old female with intermittent muscle spasms (myoclonus).  FINDINGS: Posterior dominant background rhythms, which attenuate with eye opening, ranging 10-11 hertz and 40-50 microvolts. No focal, lateralizing, epileptiform activity or seizures are seen. Patient recorded in the awake and drowsy state. EKG channel shows regular rhythm of 75-80 beats per minute.   IMPRESSION:   Normal EEG in the awake and drowsy states.    INTERPRETING PHYSICIAN:  Penni Bombard, MD Certified in Neurology, Neurophysiology and Neuroimaging  [facility] [address], [place], [place] [address] [phone]

## 2017-08-31 ENCOUNTER — Telehealth: Payer: Self-pay | Admitting: *Deleted

## 2017-08-31 NOTE — Telephone Encounter (Signed)
Attempted to reach patient with unremarkable MRI brain, MRI cervical spine and normal EEG results. Phone was picked up but no one spoke when this RN spoke. Will try to reach later. She does have an active My Chart account.

## 2017-09-01 ENCOUNTER — Telehealth: Payer: Self-pay | Admitting: *Deleted

## 2017-09-01 NOTE — Telephone Encounter (Signed)
I called pt and LMVM mobile (ok per DPR) that her testing of EEG normal, and MRI brain/ cervical were unremarkable/ normal.  She will call back if questions.

## 2017-09-01 NOTE — Telephone Encounter (Signed)
-----   Message from Penni Bombard, MD sent at 08/30/2017  2:54 PM EDT ----- Unremarkable imaging results. Please call patient. Continue current plan. -VRP

## 2017-09-08 ENCOUNTER — Encounter: Payer: Self-pay | Admitting: Family Medicine

## 2017-09-09 ENCOUNTER — Other Ambulatory Visit: Payer: Self-pay | Admitting: Family Medicine

## 2017-09-09 DIAGNOSIS — N760 Acute vaginitis: Secondary | ICD-10-CM

## 2017-09-29 ENCOUNTER — Other Ambulatory Visit: Payer: Self-pay | Admitting: Family Medicine

## 2017-09-29 DIAGNOSIS — N946 Dysmenorrhea, unspecified: Secondary | ICD-10-CM

## 2017-10-01 ENCOUNTER — Encounter: Payer: Self-pay | Admitting: Family Medicine

## 2017-10-01 ENCOUNTER — Other Ambulatory Visit (HOSPITAL_COMMUNITY)
Admission: RE | Admit: 2017-10-01 | Discharge: 2017-10-01 | Disposition: A | Discharge status: Home / Self Care | Payer: Managed Care, Other (non HMO) | Source: Ambulatory Visit | Attending: Family Medicine | Admitting: Family Medicine

## 2017-10-01 ENCOUNTER — Ambulatory Visit (INDEPENDENT_AMBULATORY_CARE_PROVIDER_SITE_OTHER): Payer: Managed Care, Other (non HMO) | Admitting: Family Medicine

## 2017-10-01 VITALS — BP 108/70 | HR 68 | Temp 97.5°F | Resp 18 | Ht 68.0 in | Wt 181.6 lb

## 2017-10-01 DIAGNOSIS — F418 Other specified anxiety disorders: Secondary | ICD-10-CM | POA: Diagnosis not present

## 2017-10-01 DIAGNOSIS — F909 Attention-deficit hyperactivity disorder, unspecified type: Secondary | ICD-10-CM | POA: Diagnosis not present

## 2017-10-01 DIAGNOSIS — N761 Subacute and chronic vaginitis: Secondary | ICD-10-CM | POA: Diagnosis not present

## 2017-10-01 DIAGNOSIS — F988 Other specified behavioral and emotional disorders with onset usually occurring in childhood and adolescence: Secondary | ICD-10-CM | POA: Diagnosis not present

## 2017-10-01 MED ORDER — AMPHETAMINE-DEXTROAMPHETAMINE 10 MG PO TABS: 10.0000 mg | ORAL_TABLET | Freq: Every day | ORAL | 0 refills | Status: DC

## 2017-10-01 MED ORDER — NEOMYCIN-POLYMYXIN-HC 3.5-10000-1 OT SOLN: 3.0000 [drp] | Freq: Three times a day (TID) | OTIC | 1 refills | Status: DC

## 2017-10-01 MED ORDER — FLUCONAZOLE 150 MG PO TABS: 150.0000 mg | ORAL_TABLET | ORAL | 1 refills | Status: DC

## 2017-10-01 MED ORDER — AMPHETAMINE-DEXTROAMPHET ER 30 MG PO CP24: 30.0000 mg | ORAL_CAPSULE | ORAL | 0 refills | Status: DC

## 2017-10-01 MED ORDER — AMPHETAMINE-DEXTROAMPHET ER 30 MG PO CP24: 30.0000 mg | ORAL_CAPSULE | Freq: Every day | ORAL | 0 refills | Status: DC

## 2017-10-01 NOTE — Patient Instructions (Addendum)
Otitis Externa Otitis externa is an infection of the outer ear canal. The outer ear canal is the area between the outside of the ear and the eardrum. Otitis externa is sometimes called "swimmer's ear." Follow these instructions at home:  If you were given antibiotic ear drops, use them as told by your doctor. Do not stop using them even if your condition gets better.  Take over-the-counter and prescription medicines only as told by your doctor.  Keep all follow-up visits as told by your doctor. This is important. How is this prevented?  Keep your ear dry. Use the corner of a towel to dry your ear after you swim or bathe.  Try not to scratch or put things in your ear. Doing these things makes it easier for germs to grow in your ear.  Avoid swimming in lakes, dirty water, or pools that may not have the right amount of a chemical called chlorine.  Consider making ear drops and putting 3 or 4 drops in each ear after you swim. Ask your doctor about how you can make ear drops. Contact a doctor if:  You have a fever.  After 3 days your ear is still red, swollen, or painful.  After 3 days you still have pus coming from your ear.  Your redness, swelling, or pain gets worse.  You have a really bad headache.  You have redness, swelling, pain, or tenderness behind your ear. This information is not intended to replace advice given to you by your health care provider. Make sure you discuss any questions you have with your health care provider. Document Released: 07/22/2007 Document Revised: 02/28/2015 Document Reviewed: 11/12/2014 Elsevier Interactive Patient Education  2018 [employer]. Vaginitis Vaginitis is a condition in which the vaginal tissue swells and becomes red (inflamed). This condition is most often caused by a change in the normal balance of bacteria and yeast that live in the vagina. This change causes an overgrowth of certain bacteria or yeast, which causes the inflammation.  There are different types of vaginitis, but the most common types are:  Bacterial vaginosis.  Yeast infection (candidiasis).  Trichomoniasis vaginitis. This is a sexually transmitted disease (STD).  Viral vaginitis.  Atrophic vaginitis.  Allergic vaginitis.  What are the causes? The cause of this condition depends on the type of vaginitis. It can be caused by:  Bacteria (bacterial vaginosis).  Yeast, which is a fungus (yeast infection).  A parasite (trichomoniasis vaginitis).  A virus (viral vaginitis).  Low hormone levels (atrophic vaginitis). Low hormone levels can occur during pregnancy, breastfeeding, or after menopause.  Irritants, such as bubble baths, scented tampons, and feminine sprays (allergic vaginitis).  Other factors can change the normal balance of the yeast and bacteria that live in the vagina. These include:  Antibiotic medicines.  Poor hygiene.  Diaphragms, vaginal sponges, spermicides, birth control pills, and intrauterine devices (IUD).  Sex.  Infection.  Uncontrolled diabetes.  A weakened defense (immune) system.  What increases the risk? This condition is more likely to develop in women who:  Smoke.  Use vaginal douches, scented tampons, or scented sanitary pads.  Wear tight-fitting pants.  Wear thong underwear.  Use oral birth control pills or an IUD.  Have sex without a condom.  Have multiple sex partners.  Have an STD.  Frequently use the spermicide nonoxynol-9.  Eat lots of foods high in sugar.  Have uncontrolled diabetes.  Have low estrogen levels.  Have a weakened immune system from an immune disorder or medical  treatment.  Are pregnant or breastfeeding.  What are the signs or symptoms? Symptoms vary depending on the cause of the vaginitis. Common symptoms include:  Abnormal vaginal discharge. ? The discharge is white, gray, or yellow with bacterial vaginosis. ? The discharge is thick, white, and cheesy with  a yeast infection. ? The discharge is frothy and yellow or greenish with trichomoniasis.  A bad vaginal smell. The smell is fishy with bacterial vaginosis.  Vaginal itching, pain, or swelling.  Sex that is painful.  Pain or burning when urinating.  Sometimes there are no symptoms. How is this diagnosed? This condition is diagnosed based on your symptoms and medical history. A physical exam, including a pelvic exam, will also be done. You may also have other tests, including:  Tests to determine the pH level (acidity or alkalinity) of your vagina.  A whiff test, to assess the odor that results when a sample of your vaginal discharge is mixed with a potassium hydroxide solution.  Tests of vaginal fluid. A sample will be examined under a microscope.  How is this treated? Treatment varies depending on the type of vaginitis you have. Your treatment may include:  Antibiotic creams or pills to treat bacterial vaginosis and trichomoniasis.  Antifungal medicines, such as vaginal creams or suppositories, to treat a yeast infection.  Medicine to ease discomfort if you have viral vaginitis. Your sexual partner should also be treated.  Estrogen delivered in a cream, pill, suppository, or vaginal ring to treat atrophic vaginitis. If vaginal dryness occurs, lubricants and moisturizing creams may help. You may need to avoid scented soaps, sprays, or douches.  Stopping use of a product that is causing allergic vaginitis. Then using a vaginal cream to treat the symptoms.  Follow these instructions at home: Lifestyle  Keep your genital area clean and dry. Avoid soap, and only rinse the area with water.  Do not douche or use tampons until your health care provider says it is okay to do so. Use sanitary pads, if needed.  Do not have sex until your health care provider approves. When you can return to sex, practice safe sex and use condoms.  Wipe from front to back. This avoids the spread of  bacteria from the rectum to the vagina. General instructions  Take over-the-counter and prescription medicines only as told by your health care provider.  If you were prescribed an antibiotic medicine, take or use it as told by your health care provider. Do not stop taking or using the antibiotic even if you start to feel better.  Keep all follow-up visits as told by your health care provider. This is important. How is this prevented?  Use mild, non-scented products. Do not use things that can irritate the vagina, such as fabric softeners. Avoid the following products if they are scented: ? Feminine sprays. ? Detergents. ? Tampons. ? Feminine hygiene products. ? Soaps or bubble baths.  Let air reach your genital area. ? Wear cotton underwear to reduce moisture buildup. ? Avoid wearing underwear while you sleep. ? Avoid wearing tight pants and underwear or nylons without a cotton panel. ? Avoid wearing thong underwear.  Take off any wet clothing, such as bathing suits, as soon as possible.  Practice safe sex and use condoms. Contact a health care provider if:  You have abdominal pain.  You have a fever.  You have symptoms that last for more than 2-3 days. Get help right away if:  You have a fever and your symptoms  suddenly get worse. Summary  Vaginitis is a condition in which the vaginal tissue becomes inflamed.This condition is most often caused by a change in the normal balance of bacteria and yeast that live in the vagina.  Treatment varies depending on the type of vaginitis you have.  Do not douche, use tampons , or have sex until your health care provider approves. When you can return to sex, practice safe sex and use condoms. This information is not intended to replace advice given to you by your health care provider. Make sure you discuss any questions you have with your health care provider. Document Released: 11/30/2006 Document Revised: 03/10/2016 Document  Reviewed: 03/10/2016 Elsevier Interactive Patient Education  [employer].

## 2017-10-01 NOTE — Assessment & Plan Note (Signed)
Doing well on curent meds, uds and contract utd. Refills alllowed today

## 2017-10-01 NOTE — Assessment & Plan Note (Signed)
Is doing better on current meds. She is given list of psychiatrists to call for an appointment. She has an appointment scheduled with counselor already.

## 2017-10-01 NOTE — Progress Notes (Signed)
Subjective:    Patient ID: Kathryn [name], female    DOB: 11-30-1995, 22 y.o.   MRN: [id number]  No chief complaint on file.   HPI Patient is in today for follow up and she is doing bette on her current meds. She called psychiatry but did not get appointment she has an appointment with counselor soon. No recent febrile illness or hospitalizations. She notes some anhedonia but no suicidal ideation. Denies CP/palp/SOB/HA/congestion/fevers/GI or GU c/o. Taking meds as prescribed  Past Medical History:  Diagnosis Date  . Abnormal liver function tests 02/02/2016  . ADHD 06/10/2016  . Alcohol use 02/23/2016  . Allergy   . Anxiety   . Arm fracture, right    as child, fx in three places, no surgery  . Depression   . Depression with anxiety 05-11-14   PTSD s/p death of best friend   . Essential hypertension 02/02/2016  . GERD (gastroesophageal reflux disease)   . 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 Headache 04/09/2016  . Hyperlipidemia, mixed 02/02/2016  . Migraine   . Ovarian cyst   . PCOS (polycystic ovarian syndrome)   . Preventative health care 10/27/2015  . Thrombocytosis ([place]) 04/09/2016    Past Surgical History:  Procedure Laterality Date  . ADENOIDECTOMY  2002  . TYMPANOSTOMY TUBE PLACEMENT Bilateral 1999    Family History  Problem Relation Age of Onset  . Hypertension Mother   . Heart disease Mother        CAD, stent at 68, s/p cardiac arrest with Defib  . Hyperlipidemia Mother   . Diabetes Mother        s/p gastric bypass  . Obesity Mother        s/p gastric bypass  . Hyperlipidemia Father   . Hypertension Father   . Asthma Sister   . Hyperlipidemia Brother   . Stroke Maternal Grandmother   . Thyroid disease Maternal Grandmother   . Heart disease Maternal Grandfather        MI first at 32, s/p stents and CABG  . Hyperlipidemia Maternal Grandfather   . Hypertension Maternal Grandfather   . Stroke Maternal Grandfather   . Cancer Maternal Grandfather        melanoma    . Aneurysm Maternal Grandfather        brain  . Kidney disease Maternal Grandfather        tumor  . Heart disease Paternal Grandmother   . Cancer Paternal Lucilla Edin and brain  . Heart disease Maternal Uncle   . Vision loss Maternal Uncle     Social History   Socioeconomic History  . Marital status: Single    Spouse name: Not on file  . Number of children: 0  . Years of education: 20  . Highest education level: Not on file  Occupational History    Comment: [employer]  Social Needs  . Financial resource strain: Not on file  . Food insecurity:    Worry: Not on file    Inability: Not on file  . Transportation needs:    Medical: Not on file    Non-medical: Not on file  Tobacco Use  . Smoking status: Current Some Day Smoker    Packs/day: 2.00    Types: Cigarettes  . Smokeless tobacco: Current User    Types: Snuff  Substance and Sexual Activity  . Alcohol use: No    Alcohol/week: 0.0 standard drinks    Comment: maybe every 6  months  . Drug use: Yes    Types: Marijuana    Comment: 08/04/17 "weed daily"  . Sexual activity: Yes    Birth control/protection: Pill    Comment: lives with partner, works for [employer], no diewtary restrictions.   Lifestyle  . Physical activity:    Days per week: Not on file    Minutes per session: Not on file  . Stress: Not on file  Relationships  . Social connections:    Talks on phone: Not on file    Gets together: Not on file    Attends religious service: Not on file    Active member of club or organization: Not on file    Attends meetings of clubs or organizations: Not on file    Relationship status: Not on file  . Intimate partner violence:    Fear of current or ex partner: Not on file    Emotionally abused: Not on file    Physically abused: Not on file    Forced sexual activity: Not on file  Other Topics Concern  . Not on file  Social History Narrative   Lives with girlfriend's family   Caffeine- soda  and tea, 6 cans daily    Outpatient Medications Prior to Visit  Medication Sig Dispense Refill  . albuterol (PROVENTIL HFA;VENTOLIN HFA) 108 (90 Base) MCG/ACT inhaler Inhale 2 puffs into the lungs every 6 (six) hours as needed for wheezing or shortness of breath. 3 Inhaler 0  . busPIRone (BUSPAR) 10 MG tablet TAKE 1 TABLET BY MOUTH THREE TIMES A DAY 270 tablet 1  . Dexlansoprazole 30 MG capsule Take 30 mg by mouth daily.    . Levonorgestrel-Ethinyl Estradiol (AMETHIA,CAMRESE) 0.15-0.03 &0.01 MG tablet TAKE 1 TABLET BY MOUTH EVERY DAY 91 tablet 1  . mometasone-formoterol (DULERA) 100-5 MCG/ACT AERO Inhale 2 puffs into the lungs 2 (two) times daily. 3 Inhaler 1  . montelukast (SINGULAIR) 10 MG tablet Take 1 tablet (10 mg total) by mouth at bedtime. 90 tablet 1  . valACYclovir (VALTREX) 1000 MG tablet 2 tab by mouth in AM and 2 tabs by mouth 12 hrs later at start of cold sore 60 tablet 0  . venlafaxine XR (EFFEXOR XR) 75 MG 24 hr capsule Take 1 capsule (75 mg total) by mouth daily with breakfast. 30 capsule 3  . amphetamine-dextroamphetamine (ADDERALL XR) 30 MG 24 hr capsule Take 1 capsule (30 mg total) by mouth every morning. August 2019 30 capsule 0  . amphetamine-dextroamphetamine (ADDERALL XR) 30 MG 24 hr capsule Take 1 capsule (30 mg total) by mouth daily. June 2019 30 capsule 0  . amphetamine-dextroamphetamine (ADDERALL XR) 30 MG 24 hr capsule Take 1 capsule (30 mg total) by mouth every morning. July 2019 30 capsule 0  . amphetamine-dextroamphetamine (ADDERALL) 10 MG tablet Take 1 tablet (10 mg total) by mouth daily with breakfast. June 2019 30 tablet 0  . amphetamine-dextroamphetamine (ADDERALL) 10 MG tablet Take 1 tablet (10 mg total) by mouth daily with breakfast. August 2019 30 tablet 0  . amphetamine-dextroamphetamine (ADDERALL) 10 MG tablet Take 1 tablet (10 mg total) by mouth daily with breakfast. July 2019 30 tablet 0   No facility-administered medications prior to visit.      Allergies  Allergen Reactions  . Oxycodone Nausea Only    Oxycontin also    Review of Systems  Constitutional: Negative for fever and malaise/fatigue.  HENT: Negative for congestion.   Eyes: Negative for blurred vision.  Respiratory: Negative for shortness  of breath.   Cardiovascular: Negative for chest pain, palpitations and leg swelling.  Gastrointestinal: Negative for abdominal pain, blood in stool and nausea.  Genitourinary: Negative for dysuria and frequency.  Musculoskeletal: Negative for falls.  Skin: Negative for rash.  Neurological: Negative for dizziness, loss of consciousness and headaches.  Endo/Heme/Allergies: Negative for environmental allergies.  Psychiatric/Behavioral: Positive for depression. The patient is nervous/anxious.        Objective:    Physical Exam  Constitutional: She is oriented to person, place, and time. She appears well-developed and well-nourished. No distress.  HENT:  Head: Normocephalic and atraumatic.  Nose: Nose normal.  Eyes: Right eye exhibits no discharge. Left eye exhibits no discharge.  Neck: Normal range of motion. Neck supple.  Cardiovascular: Normal rate and regular rhythm.  No murmur heard. Pulmonary/Chest: Effort normal and breath sounds normal.  Abdominal: Soft. Bowel sounds are normal. There is no tenderness.  Musculoskeletal: She exhibits no edema.  Neurological: She is alert and oriented to person, place, and time.  Skin: Skin is warm and dry.  Psychiatric: She has a normal mood and affect.  Nursing note and vitals reviewed.   BP 108/70 (BP Location: Left Arm, Patient Position: Sitting, Cuff Size: Normal)   Pulse 68   Temp (!) 97.5 F (36.4 C) (Oral)   Resp 18   Ht 5\' 8"  (1.727 m)   Wt 181 lb 9.6 oz (82.4 kg)   SpO2 98%   BMI 27.61 kg/m  Wt Readings from Last 3 Encounters:  10/01/17 181 lb 9.6 oz (82.4 kg)  08/20/17 182 lb 12.8 oz (82.9 kg)  08/04/17 185 lb 6.4 oz (84.1 kg)     Lab Results  Component  Value Date   WBC 9.0 07/14/2017   HGB 14.5 07/14/2017   HCT 42.3 07/14/2017   PLT 413 (H) 07/14/2017   GLUCOSE 104 (H) 05/05/2017   CHOL 218 (H) 05/05/2017   TRIG 107.0 05/05/2017   HDL 42.00 05/05/2017   LDLDIRECT 138.0 01/22/2016   LDLCALC 155 (H) 05/05/2017   ALT 28 05/05/2017   AST 22 05/05/2017   NA 146 (H) 05/05/2017   K 4.0 05/05/2017   CL 107 05/05/2017   CREATININE 0.90 05/05/2017   BUN 7 05/05/2017   CO2 26 05/05/2017   TSH 2.49 05/05/2017   HGBA1C 5.5 08/04/2017    Lab Results  Component Value Date   TSH 2.49 05/05/2017   Lab Results  Component Value Date   WBC 9.0 07/14/2017   HGB 14.5 07/14/2017   HCT 42.3 07/14/2017   MCV 89.8 07/14/2017   PLT 413 (H) 07/14/2017   Lab Results  Component Value Date   NA 146 (H) 05/05/2017   K 4.0 05/05/2017   CO2 26 05/05/2017   GLUCOSE 104 (H) 05/05/2017   BUN 7 05/05/2017   CREATININE 0.90 05/05/2017   BILITOT 0.4 05/05/2017   ALKPHOS 68 05/05/2017   AST 22 05/05/2017   ALT 28 05/05/2017   PROT 7.2 05/05/2017   ALBUMIN 4.8 05/05/2017   CALCIUM 10.2 05/05/2017   ANIONGAP 10 03/21/2017   GFR 82.98 05/05/2017   Lab Results  Component Value Date   CHOL 218 (H) 05/05/2017   Lab Results  Component Value Date   HDL 42.00 05/05/2017   Lab Results  Component Value Date   LDLCALC 155 (H) 05/05/2017   Lab Results  Component Value Date   TRIG 107.0 05/05/2017   Lab Results  Component Value Date   CHOLHDL 5 05/05/2017  Lab Results  Component Value Date   HGBA1C 5.5 08/04/2017       Assessment & Plan:   Problem List Items Addressed This Visit    Depression with anxiety    Is doing better on current meds. She is given list of psychiatrists to call for an appointment. She has an appointment scheduled with counselor already.       ADHD    Doing well on curent meds, uds and contract utd. Refills alllowed today       Other Visit Diagnoses    Subacute vaginitis    -  Primary   Relevant Orders    Urine cytology ancillary only   Attention deficit disorder, unspecified hyperactivity presence       Relevant Medications   amphetamine-dextroamphetamine (ADDERALL) 10 MG tablet   amphetamine-dextroamphetamine (ADDERALL XR) 30 MG 24 hr capsule   amphetamine-dextroamphetamine (ADDERALL XR) 30 MG 24 hr capsule   amphetamine-dextroamphetamine (ADDERALL XR) 30 MG 24 hr capsule   amphetamine-dextroamphetamine (ADDERALL) 10 MG tablet   amphetamine-dextroamphetamine (ADDERALL) 10 MG tablet      I have changed Deziray Wisenbaker's amphetamine-dextroamphetamine, amphetamine-dextroamphetamine, amphetamine-dextroamphetamine, amphetamine-dextroamphetamine, amphetamine-dextroamphetamine, and amphetamine-dextroamphetamine. I am also having her start on fluconazole and neomycin-polymyxin-hydrocortisone. Additionally, I am having her maintain her albuterol, mometasone-formoterol, Dexlansoprazole, montelukast, venlafaxine XR, valACYclovir, busPIRone, and Levonorgestrel-Ethinyl Estradiol.  Meds ordered this encounter  Medications  . amphetamine-dextroamphetamine (ADDERALL) 10 MG tablet    Sig: Take 1 tablet (10 mg total) by mouth daily with breakfast. November 2019    Dispense:  30 tablet    Refill:  0  . amphetamine-dextroamphetamine (ADDERALL XR) 30 MG 24 hr capsule    Sig: Take 1 capsule (30 mg total) by mouth every morning. November 2019    Dispense:  30 capsule    Refill:  0  . amphetamine-dextroamphetamine (ADDERALL XR) 30 MG 24 hr capsule    Sig: Take 1 capsule (30 mg total) by mouth daily. October 2019    Dispense:  30 capsule    Refill:  0  . amphetamine-dextroamphetamine (ADDERALL XR) 30 MG 24 hr capsule    Sig: Take 1 capsule (30 mg total) by mouth every morning. September 2019    Dispense:  30 capsule    Refill:  0  . amphetamine-dextroamphetamine (ADDERALL) 10 MG tablet    Sig: Take 1 tablet (10 mg total) by mouth daily with breakfast. October 2019    Dispense:  30 tablet    Refill:  0  .  amphetamine-dextroamphetamine (ADDERALL) 10 MG tablet    Sig: Take 1 tablet (10 mg total) by mouth daily with breakfast. September 2019    Dispense:  30 tablet    Refill:  0  . fluconazole (DIFLUCAN) 150 MG tablet    Sig: Take 1 tablet (150 mg total) by mouth once a week.    Dispense:  2 tablet    Refill:  1  . neomycin-polymyxin-hydrocortisone (CORTISPORIN) OTIC solution    Sig: Place 3 drops into the right ear 3 (three) times daily.    Dispense:  10 mL    Refill:  1    CMA served as scribe during this visit. History, Physical and Plan performed by medical provider. Documentation and orders reviewed and attested to.  Penni Homans, MD

## 2017-10-04 LAB — URINE CYTOLOGY ANCILLARY ONLY
Chlamydia: NEGATIVE
Neisseria Gonorrhea: NEGATIVE
Trichomonas: NEGATIVE

## 2017-10-05 ENCOUNTER — Encounter: Payer: Self-pay | Admitting: Family Medicine

## 2017-10-05 LAB — URINE CYTOLOGY ANCILLARY ONLY
Bacterial vaginitis: NEGATIVE
Candida vaginitis: NEGATIVE

## 2017-10-13 ENCOUNTER — Encounter: Payer: Self-pay | Admitting: Family Medicine

## 2017-10-14 ENCOUNTER — Telehealth: Payer: Self-pay | Admitting: Family Medicine

## 2017-10-14 ENCOUNTER — Other Ambulatory Visit: Payer: Self-pay | Admitting: Family Medicine

## 2017-10-14 DIAGNOSIS — N761 Subacute and chronic vaginitis: Secondary | ICD-10-CM

## 2017-10-14 NOTE — Telephone Encounter (Signed)
Copied from [place] [id number]. Topic: General - Other >> Oct 14, 2017 11:09 AM Cecelia Byars, NT wrote: Reason for MEQ:[facility] called and would like to know if the practice does testing for herpes specifically ,due to her having symptoms and she would like a to have a call to discuss this further at [phone]

## 2017-10-15 ENCOUNTER — Other Ambulatory Visit (INDEPENDENT_AMBULATORY_CARE_PROVIDER_SITE_OTHER): Payer: Managed Care, Other (non HMO)

## 2017-10-15 ENCOUNTER — Other Ambulatory Visit (HOSPITAL_COMMUNITY)
Admission: RE | Admit: 2017-10-15 | Discharge: 2017-10-15 | Disposition: A | Discharge status: Home / Self Care | Payer: Managed Care, Other (non HMO) | Source: Ambulatory Visit | Attending: Family Medicine | Admitting: Family Medicine

## 2017-10-15 DIAGNOSIS — N761 Subacute and chronic vaginitis: Secondary | ICD-10-CM

## 2017-10-15 NOTE — Addendum Note (Signed)
Addended by: Caffie Pinto on: 10/15/2017 10:53 AM   Modules accepted: Orders

## 2017-10-18 ENCOUNTER — Other Ambulatory Visit: Payer: Self-pay | Admitting: Family Medicine

## 2017-10-18 MED ORDER — VALACYCLOVIR HCL 1 G PO TABS: 1000.0000 mg | ORAL_TABLET | Freq: Two times a day (BID) | ORAL | 0 refills | Status: DC

## 2017-10-19 ENCOUNTER — Ambulatory Visit (INDEPENDENT_AMBULATORY_CARE_PROVIDER_SITE_OTHER): Payer: 59 | Admitting: Psychiatry

## 2017-10-19 ENCOUNTER — Telehealth: Payer: Self-pay | Admitting: *Deleted

## 2017-10-19 ENCOUNTER — Ambulatory Visit (HOSPITAL_COMMUNITY): Payer: Self-pay | Admitting: Psychiatry

## 2017-10-19 DIAGNOSIS — F901 Attention-deficit hyperactivity disorder, predominantly hyperactive type: Secondary | ICD-10-CM | POA: Diagnosis not present

## 2017-10-19 DIAGNOSIS — F3162 Bipolar disorder, current episode mixed, moderate: Secondary | ICD-10-CM | POA: Diagnosis not present

## 2017-10-19 LAB — URINE CYTOLOGY ANCILLARY ONLY
Bacterial vaginitis: NEGATIVE
Candida vaginitis: NEGATIVE
Chlamydia: NEGATIVE
Neisseria Gonorrhea: NEGATIVE
Trichomonas: NEGATIVE

## 2017-10-19 LAB — RPR: RPR Ser Ql: NONREACTIVE

## 2017-10-19 LAB — HIV ANTIBODY (ROUTINE TESTING W REFLEX): HIV 1&2 Ab, 4th Generation: NONREACTIVE

## 2017-10-19 NOTE — Telephone Encounter (Signed)
Received Lab Report results from [facility]; forwarded to provider/SLS 09/03

## 2017-10-19 NOTE — Telephone Encounter (Signed)
Patient was seen. 
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
## 2017-10-20 ENCOUNTER — Encounter: Payer: Self-pay | Admitting: Family Medicine

## 2017-10-20 LAB — HSV TYPE I/II IGG, IGMW/ REFLEX
HSV 1 Glycoprotein G Ab, IgG: 34.7 index — ABNORMAL HIGH (ref 0.00–0.90)
HSV 1 IgM: <1:10 {titer}
HSV 2 IgG, Type Spec: 0.94 index — ABNORMAL HIGH (ref 0.00–0.90)
HSV 2 IgM: <1:10 {titer}

## 2017-10-20 LAB — HSV-2 IGG SUPPLEMENTAL TEST: HSV-2 IgG Supplemental Test: POSITIVE — AB

## 2017-10-22 ENCOUNTER — Ambulatory Visit (INDEPENDENT_AMBULATORY_CARE_PROVIDER_SITE_OTHER): Payer: 59 | Admitting: Psychology

## 2017-10-22 DIAGNOSIS — F3132 Bipolar disorder, current episode depressed, moderate: Secondary | ICD-10-CM

## 2017-10-26 ENCOUNTER — Ambulatory Visit (INDEPENDENT_AMBULATORY_CARE_PROVIDER_SITE_OTHER): Payer: 59 | Admitting: Psychology

## 2017-10-26 DIAGNOSIS — F3132 Bipolar disorder, current episode depressed, moderate: Secondary | ICD-10-CM

## 2017-10-31 ENCOUNTER — Other Ambulatory Visit: Payer: Self-pay | Admitting: Family Medicine

## 2017-11-02 ENCOUNTER — Ambulatory Visit: Payer: Self-pay | Admitting: Medical

## 2017-11-02 DIAGNOSIS — Z0289 Encounter for other administrative examinations: Secondary | ICD-10-CM

## 2017-11-04 ENCOUNTER — Encounter (HOSPITAL_COMMUNITY): Payer: Self-pay | Admitting: Psychiatry

## 2017-11-04 ENCOUNTER — Ambulatory Visit (INDEPENDENT_AMBULATORY_CARE_PROVIDER_SITE_OTHER): Payer: Self-pay | Admitting: Psychiatry

## 2017-11-04 VITALS — BP 144/92 | HR 92 | Ht 68.0 in | Wt 176.4 lb

## 2017-11-04 DIAGNOSIS — F3162 Bipolar disorder, current episode mixed, moderate: Secondary | ICD-10-CM

## 2017-11-04 DIAGNOSIS — Z818 Family history of other mental and behavioral disorders: Secondary | ICD-10-CM

## 2017-11-04 DIAGNOSIS — F411 Generalized anxiety disorder: Secondary | ICD-10-CM

## 2017-11-04 DIAGNOSIS — Z62810 Personal history of physical and sexual abuse in childhood: Secondary | ICD-10-CM

## 2017-11-04 DIAGNOSIS — F901 Attention-deficit hyperactivity disorder, predominantly hyperactive type: Secondary | ICD-10-CM

## 2017-11-04 DIAGNOSIS — Z62811 Personal history of psychological abuse in childhood: Secondary | ICD-10-CM

## 2017-11-04 DIAGNOSIS — F1220 Cannabis dependence, uncomplicated: Secondary | ICD-10-CM

## 2017-11-04 DIAGNOSIS — F17210 Nicotine dependence, cigarettes, uncomplicated: Secondary | ICD-10-CM

## 2017-11-04 MED ORDER — LAMOTRIGINE 25 MG PO TABS: 25.0000 mg | ORAL_TABLET | Freq: Every day | ORAL | 0 refills | Status: DC
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
## 2017-11-09 ENCOUNTER — Other Ambulatory Visit: Payer: Self-pay | Admitting: Family Medicine

## 2017-11-10 ENCOUNTER — Ambulatory Visit (INDEPENDENT_AMBULATORY_CARE_PROVIDER_SITE_OTHER): Payer: Managed Care, Other (non HMO) | Admitting: Psychology

## 2017-11-10 DIAGNOSIS — F3132 Bipolar disorder, current episode depressed, moderate: Secondary | ICD-10-CM | POA: Diagnosis not present

## 2017-11-17 ENCOUNTER — Ambulatory Visit: Payer: Self-pay | Admitting: Psychology

## 2017-12-02 ENCOUNTER — Encounter (HOSPITAL_COMMUNITY): Payer: Self-pay | Admitting: Psychiatry

## 2017-12-02 ENCOUNTER — Ambulatory Visit (INDEPENDENT_AMBULATORY_CARE_PROVIDER_SITE_OTHER): Payer: 59 | Admitting: Psychiatry

## 2017-12-02 ENCOUNTER — Other Ambulatory Visit: Payer: Self-pay

## 2017-12-02 VITALS — BP 132/88 | HR 97 | Ht 68.0 in | Wt 175.0 lb

## 2017-12-02 DIAGNOSIS — F17210 Nicotine dependence, cigarettes, uncomplicated: Secondary | ICD-10-CM

## 2017-12-02 DIAGNOSIS — F901 Attention-deficit hyperactivity disorder, predominantly hyperactive type: Secondary | ICD-10-CM | POA: Diagnosis not present

## 2017-12-02 DIAGNOSIS — F411 Generalized anxiety disorder: Secondary | ICD-10-CM

## 2017-12-02 DIAGNOSIS — F3162 Bipolar disorder, current episode mixed, moderate: Secondary | ICD-10-CM | POA: Diagnosis not present

## 2017-12-02 MED ORDER — LAMOTRIGINE 25 MG PO TABS: 25.0000 mg | ORAL_TABLET | Freq: Every day | ORAL | 1 refills | Status: DC

## 2017-12-02 NOTE — Progress Notes (Signed)
[facility] Outpatient Follow up visit   Patient Identification: Kathryn [name] MRN:  [id number] Date of Evaluation:  12/02/2017 Referral Source: Primary care and counsellor Chief Complaint:   Chief Complaint    Follow-up; Other     Visit Diagnosis:    ICD-10-CM   1. Attention deficit hyperactivity disorder (ADHD), predominantly hyperactive type F90.1   2. Bipolar 1 disorder, mixed, moderate ([place]) F31.62   3. GAD (generalized anxiety disorder) F41.1   4. Cigarette nicotine dependence without complication H88.502     History of Present Illness: 22 years old currently single Caucasian female, lives with roommate,  referred by counselor in [place] for management of mood swings depression anxiety.  Patient has been diagnosed with bipolar and has been taking medication from the primary care physician she is on Effexor and BuSpar.  She also was diagnosed with ADHD apparently she was on 40mg  last visit  She was endorsing depression mood symptoms and getting hyped up at times in the past.  He started Lamictal now to do the 50 mg she is more calm her she has stopped using marijuana.  She has cut down the Adderall from 40 mg to 10 mg but she takes in the morning so we talked about taking an afternoon her work hours or during the afternoon.  She feels more relaxed also and feels medication adjustment has been helpful  She is using nicotine 2 packs a day and also caffeinated beverages around 6-7 a day. Says have cut down some  Has been detoxed off xanax prior last visit. Not using since or any alcohol  Modifying factor: mom, friends Aggravating factorl; past use of drugs. Abuse history  Less flashbacks. Sleep fairn on lamictal    Past Psychiatric History: depression, anxiety , alcohol use  Previous Psychotropic Medications: Yes   Substance Abuse History in the last 12 months:  Yes.    Consequences of Substance Abuse: Medical Consequences:  depression, mood swings.   Past Medical History:   Past Medical History:  Diagnosis Date  . Abnormal liver function tests 02/02/2016  . ADHD 06/10/2016  . Alcohol use 02/23/2016  . Allergy   . Anxiety   . Arm fracture, right    as child, fx in three places, no surgery  . Depression   . Depression with anxiety 04-29-14   PTSD s/p death of best friend   . Essential hypertension 02/02/2016  . GERD (gastroesophageal reflux disease)   . 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 Headache 04/09/2016  . Hyperlipidemia, mixed 02/02/2016  . Migraine   . Ovarian cyst   . PCOS (polycystic ovarian syndrome)   . Preventative health care 10/27/2015  . Thrombocytosis ([place]) 04/09/2016    Past Surgical History:  Procedure Laterality Date  . ADENOIDECTOMY  2002  . TYMPANOSTOMY TUBE PLACEMENT Bilateral 1999    Family Psychiatric History: mom side has depression , anxiety  Family History:  Family History  Problem Relation Age of Onset  . Hypertension Mother   . Heart disease Mother        CAD, stent at 58, s/p cardiac arrest with Defib  . Hyperlipidemia Mother   . Diabetes Mother        s/p gastric bypass  . Obesity Mother        s/p gastric bypass  . Hyperlipidemia Father   . Hypertension Father   . Asthma Sister   . Hyperlipidemia Brother   . Stroke Maternal Grandmother   . Thyroid disease Maternal Grandmother   . Heart  disease Maternal Grandfather        MI first at 71, s/p stents and CABG  . Hyperlipidemia Maternal Grandfather   . Hypertension Maternal Grandfather   . Stroke Maternal Grandfather   . Cancer Maternal Grandfather        melanoma  . Aneurysm Maternal Grandfather        brain  . Kidney disease Maternal Grandfather        tumor  . Heart disease Paternal Grandmother   . Cancer Paternal Lucilla Edin and brain  . Heart disease Maternal Uncle   . Vision loss Maternal Uncle     Social History:   Social History   Socioeconomic History  . Marital status: Single    Spouse name: Not on file  . Number of children: 0   . Years of education: 62  . Highest education level: Not on file  Occupational History    Comment: [employer]  Social Needs  . Financial resource strain: Not on file  . Food insecurity:    Worry: Not on file    Inability: Not on file  . Transportation needs:    Medical: Not on file    Non-medical: Not on file  Tobacco Use  . Smoking status: Heavy Tobacco Smoker    Packs/day: 1.50    Types: Cigarettes  . Smokeless tobacco: Current User    Types: Snuff  Substance and Sexual Activity  . Alcohol use: No    Alcohol/week: 0.0 standard drinks    Comment: maybe every 6 months  . Drug use: Yes    Types: Marijuana    Comment: 08/04/17 "weed daily"  . Sexual activity: Yes    Birth control/protection: Pill    Comment: lives with partner, works for [employer], no diewtary restrictions.   Lifestyle  . Physical activity:    Days per week: Not on file    Minutes per session: Not on file  . Stress: Not on file  Relationships  . Social connections:    Talks on phone: Not on file    Gets together: Not on file    Attends religious service: Not on file    Active member of club or organization: Not on file    Attends meetings of clubs or organizations: Not on file    Relationship status: Not on file  Other Topics Concern  . Not on file  Social History Narrative   Lives with girlfriend's family   Caffeine- soda and tea, 6 cans daily      Allergies:   Allergies  Allergen Reactions  . Oxycodone Nausea Only    Oxycontin also  . Seroquel [Quetiapine Fumarate]     Mental status changes    Metabolic Disorder Labs: Lab Results  Component Value Date   HGBA1C 5.5 08/04/2017   No results found for: PROLACTIN Lab Results  Component Value Date   CHOL 218 (H) 05/05/2017   TRIG 107.0 05/05/2017   HDL 42.00 05/05/2017   CHOLHDL 5 05/05/2017   VLDL 21.4 05/05/2017   LDLCALC 155 (H) 05/05/2017     Current Medications: Current Outpatient Medications  Medication Sig  Dispense Refill  . albuterol (PROVENTIL HFA;VENTOLIN HFA) 108 (90 Base) MCG/ACT inhaler Inhale 2 puffs into the lungs every 6 (six) hours as needed for wheezing or shortness of breath. 3 Inhaler 0  . amphetamine-dextroamphetamine (ADDERALL) 10 MG tablet Take 1 tablet (10 mg total) by mouth daily with breakfast. November 2019 30  tablet 0  . amphetamine-dextroamphetamine (ADDERALL) 10 MG tablet Take 1 tablet (10 mg total) by mouth daily with breakfast. October 2019 30 tablet 0  . amphetamine-dextroamphetamine (ADDERALL) 10 MG tablet Take 1 tablet (10 mg total) by mouth daily with breakfast. September 2019 30 tablet 0  . busPIRone (BUSPAR) 10 MG tablet TAKE 1 TABLET BY MOUTH THREE TIMES A DAY 270 tablet 1  . Dexlansoprazole 30 MG capsule Take 30 mg by mouth daily.    . fluconazole (DIFLUCAN) 150 MG tablet Take 1 tablet (150 mg total) by mouth once a week. 2 tablet 1  . lamoTRIgine (LAMICTAL) 25 MG tablet Take 1 tablet (25 mg total) by mouth daily. Take 2 a day. 60 tablet 1  . Levonorgestrel-Ethinyl Estradiol (AMETHIA,CAMRESE) 0.15-0.03 &0.01 MG tablet TAKE 1 TABLET BY MOUTH EVERY DAY 91 tablet 1  . mometasone-formoterol (DULERA) 100-5 MCG/ACT AERO Inhale 2 puffs into the lungs 2 (two) times daily. 3 Inhaler 1  . montelukast (SINGULAIR) 10 MG tablet Take 1 tablet (10 mg total) by mouth at bedtime. 90 tablet 1  . neomycin-polymyxin-hydrocortisone (CORTISPORIN) OTIC solution Place 3 drops into the right ear 3 (three) times daily. 10 mL 1  . valACYclovir (VALTREX) 1000 MG tablet Take 1 tablet (1,000 mg total) by mouth 2 (two) times daily. 60 tablet 3  . venlafaxine XR (EFFEXOR-XR) 75 MG 24 hr capsule TAKE 1 CAPSULE (75 MG TOTAL) BY MOUTH DAILY WITH BREAKFAST. 90 capsule 1   No current facility-administered medications for this visit.      Psychiatric Specialty Exam: Review of Systems  HENT: Negative for hearing loss.   Skin: Negative for rash.  Neurological: Negative for tremors.   Psychiatric/Behavioral: Positive for substance abuse. Negative for suicidal ideas.    Blood pressure 132/88, pulse 97, height 5\' 8"  (1.727 m), weight 175 lb (79.4 kg).Body mass index is 26.61 kg/m.  General Appearance: Casual  Eye Contact:  Fair  Speech:  Normal Rate  Volume:  Normal  Mood:  better  Affect: better  Thought Process:  Goal Directed  Orientation:  Full (Time, Place, and Person)  Thought Content:  Rumination  Suicidal Thoughts:  No  Homicidal Thoughts:  No  Memory:  Immediate;   Fair Recent;   Fair  Judgement:  Poor  Insight:  Shallow  Psychomotor Activity:  Normal  Concentration:  Concentration: Fair and Attention Span: Fair  Recall:  [employer] of Knowledge:Good  Language: Good  Akathisia:  No  Handed:  Right  AIMS (if indicated):    Assets:  Desire for Improvement  ADL's:  Intact  Cognition: WNL  Sleep:  variable    Treatment Plan Summary: Medication management and Plan as follows  1. MOOd disorder, possible bipolar disorder, mixed or depressed type Doing better. Continue lamictal 50mg . No rash  2. GAD better. cotinue effexor.a void caffeine   3. THC use: have cut down. Encouraged abstinence 4. Alcohol use : remains sober. Discussed relapse prevention and to avoid alcohol and any benzodiazepines. 5.ADHD: somewhat distracted now on 10mg . Asked to change time to noon or before work. Gets from primary care  Reviewed meds and concerns. Fu 98m.    Merian Capron, MD 10/17/201910:27 AM

## 2017-12-07 ENCOUNTER — Ambulatory Visit (INDEPENDENT_AMBULATORY_CARE_PROVIDER_SITE_OTHER): Payer: 59 | Admitting: Psychology

## 2017-12-07 DIAGNOSIS — F3132 Bipolar disorder, current episode depressed, moderate: Secondary | ICD-10-CM | POA: Diagnosis not present

## 2017-12-31 ENCOUNTER — Ambulatory Visit: Payer: Self-pay | Admitting: Family Medicine

## 2018-01-06 ENCOUNTER — Other Ambulatory Visit (HOSPITAL_COMMUNITY): Payer: Self-pay | Admitting: Psychiatry

## 2018-01-06 ENCOUNTER — Other Ambulatory Visit: Payer: Self-pay | Admitting: Family Medicine

## 2018-01-06 ENCOUNTER — Ambulatory Visit (INDEPENDENT_AMBULATORY_CARE_PROVIDER_SITE_OTHER): Payer: Managed Care, Other (non HMO) | Admitting: Family Medicine

## 2018-01-06 ENCOUNTER — Other Ambulatory Visit (HOSPITAL_COMMUNITY): Payer: Self-pay

## 2018-01-06 VITALS — BP 125/74 | HR 108 | Temp 98.2°F | Resp 18 | Wt 175.6 lb

## 2018-01-06 DIAGNOSIS — L989 Disorder of the skin and subcutaneous tissue, unspecified: Secondary | ICD-10-CM | POA: Diagnosis not present

## 2018-01-06 DIAGNOSIS — F418 Other specified anxiety disorders: Secondary | ICD-10-CM

## 2018-01-06 DIAGNOSIS — F988 Other specified behavioral and emotional disorders with onset usually occurring in childhood and adolescence: Secondary | ICD-10-CM

## 2018-01-06 DIAGNOSIS — I10 Essential (primary) hypertension: Secondary | ICD-10-CM

## 2018-01-06 DIAGNOSIS — F901 Attention-deficit hyperactivity disorder, predominantly hyperactive type: Secondary | ICD-10-CM

## 2018-01-06 DIAGNOSIS — Z8619 Personal history of other infectious and parasitic diseases: Secondary | ICD-10-CM

## 2018-01-06 DIAGNOSIS — G4700 Insomnia, unspecified: Secondary | ICD-10-CM

## 2018-01-06 MED ORDER — LAMOTRIGINE 25 MG PO TABS: 25.0000 mg | ORAL_TABLET | Freq: Every day | ORAL | 0 refills | Status: DC

## 2018-01-06 MED ORDER — VALACYCLOVIR HCL 1 G PO TABS: 1000.0000 mg | ORAL_TABLET | Freq: Two times a day (BID) | ORAL | 5 refills | Status: DC

## 2018-01-06 MED ORDER — AMPHETAMINE-DEXTROAMPHETAMINE 10 MG PO TABS: 10.0000 mg | ORAL_TABLET | Freq: Two times a day (BID) | ORAL | 0 refills | Status: DC

## 2018-01-06 NOTE — Patient Instructions (Addendum)
Take Valtrex 1 tab daily then with a flare can add a second  Dose daily for a week  Insomnia Insomnia is a sleep disorder that makes it difficult to fall asleep or to stay asleep. Insomnia can cause tiredness (fatigue), low energy, difficulty concentrating, mood swings, and poor performance at work or school. There are three different ways to classify insomnia:  Difficulty falling asleep.  Difficulty staying asleep.  Waking up too early in the morning.  Any type of insomnia can be long-term (chronic) or short-term (acute). Both are common. Short-term insomnia usually lasts for three months or less. Chronic insomnia occurs at least three times a week for longer than three months. What are the causes? Insomnia may be caused by another condition, situation, or substance, such as:  Anxiety.  Certain medicines.  Gastroesophageal reflux disease (GERD) or other gastrointestinal conditions.  Asthma or other breathing conditions.  Restless legs syndrome, sleep apnea, or other sleep disorders.  Chronic pain.  Menopause. This may include hot flashes.  Stroke.  Abuse of alcohol, tobacco, or illegal drugs.  Depression.  Caffeine.  Neurological disorders, such as Alzheimer disease.  An overactive thyroid (hyperthyroidism).  The cause of insomnia may not be known. What increases the risk? Risk factors for insomnia include:  Gender. Women are more commonly affected than men.  Age. Insomnia is more common as you get older.  Stress. This may involve your professional or personal life.  Income. Insomnia is more common in people with lower income.  Lack of exercise.  Irregular work schedule or night shifts.  Traveling between different time zones.  What are the signs or symptoms? If you have insomnia, trouble falling asleep or trouble staying asleep is the main symptom. This may lead to other symptoms, such as:  Feeling fatigued.  Feeling nervous about going to  sleep.  Not feeling rested in the morning.  Having trouble concentrating.  Feeling irritable, anxious, or depressed.  How is this treated? Treatment for insomnia depends on the cause. If your insomnia is caused by an underlying condition, treatment will focus on addressing the condition. Treatment may also include:  Medicines to help you sleep.  Counseling or therapy.  Lifestyle adjustments.  Follow these instructions at home:  Take medicines only as directed by your health care provider.  Keep regular sleeping and waking hours. Avoid naps.  Keep a sleep diary to help you and your health care provider figure out what could be causing your insomnia. Include: ? When you sleep. ? When you wake up during the night. ? How well you sleep. ? How rested you feel the next day. ? Any side effects of medicines you are taking. ? What you eat and drink.  Make your bedroom a comfortable place where it is easy to fall asleep: ? Put up shades or special blackout curtains to block light from outside. ? Use a white noise machine to block noise. ? Keep the temperature cool.  Exercise regularly as directed by your health care provider. Avoid exercising right before bedtime.  Use relaxation techniques to manage stress. Ask your health care provider to suggest some techniques that may work well for you. These may include: ? Breathing exercises. ? Routines to release muscle tension. ? Visualizing peaceful scenes.  Cut back on alcohol, caffeinated beverages, and cigarettes, especially close to bedtime. These can disrupt your sleep.  Do not overeat or eat spicy foods right before bedtime. This can lead to digestive discomfort that can make it hard for  you to sleep.  Limit screen use before bedtime. This includes: ? Watching TV. ? Using your smartphone, tablet, and computer.  Stick to a routine. This can help you fall asleep faster. Try to do a quiet activity, brush your teeth, and go to bed  at the same time each night.  Get out of bed if you are still awake after 15 minutes of trying to sleep. Keep the lights down, but try reading or doing a quiet activity. When you feel sleepy, go back to bed.  Make sure that you drive carefully. Avoid driving if you feel very sleepy.  Keep all follow-up appointments as directed by your health care provider. This is important. Contact a health care provider if:  You are tired throughout the day or have trouble in your daily routine due to sleepiness.  You continue to have sleep problems or your sleep problems get worse. Get help right away if:  You have serious thoughts about hurting yourself or someone else. This information is not intended to replace advice given to you by your health care provider. Make sure you discuss any questions you have with your health care provider. Document Released: 01/31/2000 Document Revised: 07/05/2015 Document Reviewed: 11/03/2013 Elsevier Interactive Patient Education  [employer].

## 2018-01-09 NOTE — Assessment & Plan Note (Signed)
Encouraged good sleep hygiene such as dark, quiet room. No blue/green glowing lights such as computer screens in bedroom. No alcohol or stimulants in evening. Cut down on caffeine as able. Regular exercise is helpful but not just prior to bed time.  

## 2018-01-09 NOTE — Assessment & Plan Note (Signed)
Doing well on Adderall will continue but change the dosing to bid of the short acting.

## 2018-01-09 NOTE — Assessment & Plan Note (Signed)
Started on valtrex

## 2018-01-09 NOTE — Assessment & Plan Note (Signed)
Doing better on current meds and awaiting an appt with psychiatry.

## 2018-01-09 NOTE — Assessment & Plan Note (Signed)
Well controlled, no changes to meds. Encouraged heart healthy diet such as the DASH diet and exercise as tolerated.  °

## 2018-01-09 NOTE — Progress Notes (Signed)
Subjective:    Patient ID: Kathryn [name], female    DOB: Nov 10, 1995, 22 y.o.   MRN: [id number]  No chief complaint on file.   HPI Patient is in today for follow up and she is doing much better. She is awaiting an appointment with psychiatry. She is still struggling with insomnia and anxiety but is much calmer. She likes the Adderall short acting better. Denies CP/palp/SOB/HA/congestion/fevers/GI or GU c/o. Taking meds as prescribed  Past Medical History:  Diagnosis Date  . Abnormal liver function tests 02/02/2016  . ADHD 06/10/2016  . Alcohol use 02/23/2016  . Allergy   . Anxiety   . Arm fracture, right    as child, fx in three places, no surgery  . Depression   . Depression with anxiety 04-29-14   PTSD s/p death of best friend   . Essential hypertension 02/02/2016  . GERD (gastroesophageal reflux disease)   . 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 Headache 04/09/2016  . Hyperlipidemia, mixed 02/02/2016  . Migraine   . Ovarian cyst   . PCOS (polycystic ovarian syndrome)   . Preventative health care 10/27/2015  . Thrombocytosis ([place]) 04/09/2016    Past Surgical History:  Procedure Laterality Date  . ADENOIDECTOMY  2002  . TYMPANOSTOMY TUBE PLACEMENT Bilateral 1999    Family History  Problem Relation Age of Onset  . Hypertension Mother   . Heart disease Mother        CAD, stent at 28, s/p cardiac arrest with Defib  . Hyperlipidemia Mother   . Diabetes Mother        s/p gastric bypass  . Obesity Mother        s/p gastric bypass  . Hyperlipidemia Father   . Hypertension Father   . Asthma Sister   . Hyperlipidemia Brother   . Stroke Maternal Grandmother   . Thyroid disease Maternal Grandmother   . Heart disease Maternal Grandfather        MI first at 59, s/p stents and CABG  . Hyperlipidemia Maternal Grandfather   . Hypertension Maternal Grandfather   . Stroke Maternal Grandfather   . Cancer Maternal Grandfather        melanoma  . Aneurysm Maternal Grandfather    brain  . Kidney disease Maternal Grandfather        tumor  . Heart disease Paternal Grandmother   . Cancer Paternal Lucilla Edin and brain  . Heart disease Maternal Uncle   . Vision loss Maternal Uncle     Social History   Socioeconomic History  . Marital status: Single    Spouse name: Not on file  . Number of children: 0  . Years of education: 68  . Highest education level: Not on file  Occupational History    Comment: [employer]  Social Needs  . Financial resource strain: Not on file  . Food insecurity:    Worry: Not on file    Inability: Not on file  . Transportation needs:    Medical: Not on file    Non-medical: Not on file  Tobacco Use  . Smoking status: Heavy Tobacco Smoker    Packs/day: 1.50    Types: Cigarettes  . Smokeless tobacco: Current User    Types: Snuff  Substance and Sexual Activity  . Alcohol use: No    Alcohol/week: 0.0 standard drinks    Comment: maybe every 6 months  . Drug use: Yes    Types: Marijuana  Comment: 08/04/17 "weed daily"  . Sexual activity: Yes    Birth control/protection: Pill    Comment: lives with partner, works for [employer], no diewtary restrictions.   Lifestyle  . Physical activity:    Days per week: Not on file    Minutes per session: Not on file  . Stress: Not on file  Relationships  . Social connections:    Talks on phone: Not on file    Gets together: Not on file    Attends religious service: Not on file    Active member of club or organization: Not on file    Attends meetings of clubs or organizations: Not on file    Relationship status: Not on file  . Intimate partner violence:    Fear of current or ex partner: Not on file    Emotionally abused: Not on file    Physically abused: Not on file    Forced sexual activity: Not on file  Other Topics Concern  . Not on file  Social History Narrative   Lives with girlfriend's family   Caffeine- soda and tea, 6 cans daily    Outpatient  Medications Prior to Visit  Medication Sig Dispense Refill  . albuterol (PROVENTIL HFA;VENTOLIN HFA) 108 (90 Base) MCG/ACT inhaler Inhale 2 puffs into the lungs every 6 (six) hours as needed for wheezing or shortness of breath. 3 Inhaler 0  . Dexlansoprazole 30 MG capsule Take 30 mg by mouth daily.    . fluconazole (DIFLUCAN) 150 MG tablet Take 1 tablet (150 mg total) by mouth once a week. 2 tablet 1  . Levonorgestrel-Ethinyl Estradiol (AMETHIA,CAMRESE) 0.15-0.03 &0.01 MG tablet TAKE 1 TABLET BY MOUTH EVERY DAY 91 tablet 1  . mometasone-formoterol (DULERA) 100-5 MCG/ACT AERO Inhale 2 puffs into the lungs 2 (two) times daily. 3 Inhaler 1  . montelukast (SINGULAIR) 10 MG tablet Take 1 tablet (10 mg total) by mouth at bedtime. 90 tablet 1  . neomycin-polymyxin-hydrocortisone (CORTISPORIN) OTIC solution Place 3 drops into the right ear 3 (three) times daily. 10 mL 1  . amphetamine-dextroamphetamine (ADDERALL) 10 MG tablet Take 1 tablet (10 mg total) by mouth daily with breakfast. November 2019 30 tablet 0  . amphetamine-dextroamphetamine (ADDERALL) 10 MG tablet Take 1 tablet (10 mg total) by mouth daily with breakfast. October 2019 30 tablet 0  . amphetamine-dextroamphetamine (ADDERALL) 10 MG tablet Take 1 tablet (10 mg total) by mouth daily with breakfast. September 2019 30 tablet 0  . busPIRone (BUSPAR) 10 MG tablet TAKE 1 TABLET BY MOUTH THREE TIMES A DAY 270 tablet 1  . lamoTRIgine (LAMICTAL) 25 MG tablet Take 1 tablet (25 mg total) by mouth daily. Take 2 a day. 60 tablet 1  . valACYclovir (VALTREX) 1000 MG tablet Take 1 tablet (1,000 mg total) by mouth 2 (two) times daily. 60 tablet 3  . venlafaxine XR (EFFEXOR-XR) 75 MG 24 hr capsule TAKE 1 CAPSULE (75 MG TOTAL) BY MOUTH DAILY WITH BREAKFAST. 90 capsule 1   No facility-administered medications prior to visit.     Allergies  Allergen Reactions  . Oxycodone Nausea Only    Oxycontin also  . Seroquel [Quetiapine Fumarate]     Mental status  changes    Review of Systems  Constitutional: Negative for fever and malaise/fatigue.  HENT: Negative for congestion.   Eyes: Negative for blurred vision.  Respiratory: Negative for shortness of breath.   Cardiovascular: Negative for chest pain, palpitations and leg swelling.  Gastrointestinal: Negative for abdominal pain, blood  in stool and nausea.  Genitourinary: Negative for dysuria and frequency.  Musculoskeletal: Negative for falls.  Skin: Negative for rash.  Neurological: Negative for dizziness, loss of consciousness and headaches.  Endo/Heme/Allergies: Negative for environmental allergies.  Psychiatric/Behavioral: Negative for depression. The patient is nervous/anxious and has insomnia.        Objective:    Physical Exam  Constitutional: She is oriented to person, place, and time. No distress.  HENT:  Head: Normocephalic and atraumatic.  Right Ear: External ear normal.  Left Ear: External ear normal.  Nose: Nose normal.  Mouth/Throat: Oropharynx is clear and moist. No oropharyngeal exudate.  Eyes: Pupils are equal, round, and reactive to light. Conjunctivae are normal. Right eye exhibits no discharge. Left eye exhibits no discharge. No scleral icterus.  Neck: Normal range of motion. Neck supple. No thyromegaly present.  Cardiovascular: Normal rate, regular rhythm, normal heart sounds and intact distal pulses.  No murmur heard. Pulmonary/Chest: Effort normal and breath sounds normal. No respiratory distress. She has no wheezes. She has no rales.  Abdominal: Soft. Bowel sounds are normal. She exhibits no distension and no mass. There is no tenderness.  Musculoskeletal: Normal range of motion. She exhibits no edema or tenderness.  Lymphadenopathy:    She has no cervical adenopathy.  Neurological: She is alert and oriented to person, place, and time. She has normal reflexes. She displays normal reflexes. No cranial nerve deficit. Coordination normal.  Skin: Skin is warm and  dry. No rash noted. She is not diaphoretic.    BP 125/74 (BP Location: Left Arm, Patient Position: Sitting, Cuff Size: Normal)   Pulse (!) 108   Temp 98.2 F (36.8 C) (Oral)   Resp 18   Wt 175 lb 9.6 oz (79.7 kg)   SpO2 100%   BMI 26.70 kg/m  Wt Readings from Last 3 Encounters:  01/06/18 175 lb 9.6 oz (79.7 kg)  10/01/17 181 lb 9.6 oz (82.4 kg)  08/20/17 182 lb 12.8 oz (82.9 kg)     Lab Results  Component Value Date   WBC 9.0 07/14/2017   HGB 14.5 07/14/2017   HCT 42.3 07/14/2017   PLT 413 (H) 07/14/2017   GLUCOSE 104 (H) 05/05/2017   CHOL 218 (H) 05/05/2017   TRIG 107.0 05/05/2017   HDL 42.00 05/05/2017   LDLDIRECT 138.0 01/22/2016   LDLCALC 155 (H) 05/05/2017   ALT 28 05/05/2017   AST 22 05/05/2017   NA 146 (H) 05/05/2017   K 4.0 05/05/2017   CL 107 05/05/2017   CREATININE 0.90 05/05/2017   BUN 7 05/05/2017   CO2 26 05/05/2017   TSH 2.49 05/05/2017   HGBA1C 5.5 08/04/2017    Lab Results  Component Value Date   TSH 2.49 05/05/2017   Lab Results  Component Value Date   WBC 9.0 07/14/2017   HGB 14.5 07/14/2017   HCT 42.3 07/14/2017   MCV 89.8 07/14/2017   PLT 413 (H) 07/14/2017   Lab Results  Component Value Date   NA 146 (H) 05/05/2017   K 4.0 05/05/2017   CO2 26 05/05/2017   GLUCOSE 104 (H) 05/05/2017   BUN 7 05/05/2017   CREATININE 0.90 05/05/2017   BILITOT 0.4 05/05/2017   ALKPHOS 68 05/05/2017   AST 22 05/05/2017   ALT 28 05/05/2017   PROT 7.2 05/05/2017   ALBUMIN 4.8 05/05/2017   CALCIUM 10.2 05/05/2017   ANIONGAP 10 03/21/2017   GFR 82.98 05/05/2017   Lab Results  Component Value Date   CHOL  218 (H) 05/05/2017   Lab Results  Component Value Date   HDL 42.00 05/05/2017   Lab Results  Component Value Date   LDLCALC 155 (H) 05/05/2017   Lab Results  Component Value Date   TRIG 107.0 05/05/2017   Lab Results  Component Value Date   CHOLHDL 5 05/05/2017   Lab Results  Component Value Date   HGBA1C 5.5 08/04/2017         Assessment & Plan:   Problem List Items Addressed This Visit    Depression with anxiety    Doing better on current meds and awaiting an appt with psychiatry.      Insomnia    Encouraged good sleep hygiene such as dark, quiet room. No blue/green glowing lights such as computer screens in bedroom. No alcohol or stimulants in evening. Cut down on caffeine as able. Regular exercise is helpful but not just prior to bed time.       H/O cold sores    Started on valtrex      Essential hypertension    Well controlled, no changes to meds. Encouraged heart healthy diet such as the DASH diet and exercise as tolerated.       ADHD    Doing well on Adderall will continue but change the dosing to bid of the short acting.        Other Visit Diagnoses    Skin lesion of hand    -  Primary   Relevant Orders   Ambulatory referral to Dermatology   Attention deficit disorder, unspecified hyperactivity presence       Relevant Medications   amphetamine-dextroamphetamine (ADDERALL) 10 MG tablet   amphetamine-dextroamphetamine (ADDERALL) 10 MG tablet   amphetamine-dextroamphetamine (ADDERALL) 10 MG tablet      I have changed Kathryn [name]'s amphetamine-dextroamphetamine, amphetamine-dextroamphetamine, and amphetamine-dextroamphetamine. I am also having her maintain her albuterol, mometasone-formoterol, Dexlansoprazole, montelukast, Levonorgestrel-Ethinyl Estradiol, fluconazole, neomycin-polymyxin-hydrocortisone, and valACYclovir.  Meds ordered this encounter  Medications  . amphetamine-dextroamphetamine (ADDERALL) 10 MG tablet    Sig: Take 1 tablet (10 mg total) by mouth 2 (two) times daily with a meal. February 2020    Dispense:  60 tablet    Refill:  0  . amphetamine-dextroamphetamine (ADDERALL) 10 MG tablet    Sig: Take 1 tablet (10 mg total) by mouth 2 (two) times daily with a meal. January 2020    Dispense:  60 tablet    Refill:  0  . amphetamine-dextroamphetamine (ADDERALL) 10 MG tablet     Sig: Take 1 tablet (10 mg total) by mouth 2 (two) times daily with a meal. December 2019    Dispense:  60 tablet    Refill:  0  . valACYclovir (VALTREX) 1000 MG tablet    Sig: Take 1 tablet (1,000 mg total) by mouth 2 (two) times daily.    Dispense:  60 tablet    Refill:  5     Penni Homans, MD

## 2018-01-10 ENCOUNTER — Ambulatory Visit: Payer: 59 | Admitting: Psychology

## 2018-01-13 ENCOUNTER — Encounter: Payer: Self-pay | Admitting: Family Medicine

## 2018-01-13 DIAGNOSIS — J029 Acute pharyngitis, unspecified: Secondary | ICD-10-CM

## 2018-01-17 MED ORDER — MONTELUKAST SODIUM 10 MG PO TABS: 10.0000 mg | ORAL_TABLET | Freq: Every day | ORAL | 1 refills | Status: DC

## 2018-02-02 ENCOUNTER — Encounter (HOSPITAL_COMMUNITY): Payer: Self-pay | Admitting: Psychiatry

## 2018-02-02 ENCOUNTER — Ambulatory Visit (INDEPENDENT_AMBULATORY_CARE_PROVIDER_SITE_OTHER): Payer: 59 | Admitting: Psychiatry

## 2018-02-02 ENCOUNTER — Other Ambulatory Visit: Payer: Self-pay

## 2018-02-02 VITALS — BP 122/80 | HR 77 | Ht 68.0 in | Wt 169.0 lb

## 2018-02-02 DIAGNOSIS — F411 Generalized anxiety disorder: Secondary | ICD-10-CM

## 2018-02-02 DIAGNOSIS — F3162 Bipolar disorder, current episode mixed, moderate: Secondary | ICD-10-CM | POA: Diagnosis not present

## 2018-02-02 DIAGNOSIS — F901 Attention-deficit hyperactivity disorder, predominantly hyperactive type: Secondary | ICD-10-CM | POA: Diagnosis not present

## 2018-02-02 NOTE — Progress Notes (Signed)
[facility] Outpatient Follow up visit   Patient Identification: Kathryn [name] MRN:  [id number] Date of Evaluation:  02/02/2018 Referral Source: Primary care and counsellor Chief Complaint:   Chief Complaint    Follow-up; Other     Visit Diagnosis:    ICD-10-CM   1. Bipolar 1 disorder, mixed, moderate (HCC) F31.62   2. Attention deficit hyperactivity disorder (ADHD), predominantly hyperactive type F90.1   3. GAD (generalized anxiety disorder) F41.1     History of Present Illness: 22 years old currently single Caucasian female, lives with roommate,  referred by counselor in [place] for management of mood swings depression anxiety.  Doing fair keeping her Adderall dose low around 10 mg or twice a day She has cut down her coffee intake still uses marijuana but sporadically. Effexor and BuSpar helping anxiety she is taking twice a day she has been detoxed off Xanax in the past  Managing mood symptoms Lamictal overall things are better compared to last year she is more positive   She feels more relaxed also and feels medication adjustment has been helpful  Modifying factor: mom, friends Planning to go visit mom this christmas [place]  Aggravating factorl; past use of drugs. Abuse history  Less flashbacks.   Past Psychiatric History: depression, anxiety , alcohol use  Previous Psychotropic Medications: Yes   Substance Abuse History in the last 12 months:  Yes.    Consequences of Substance Abuse: Medical Consequences:  depression, mood swings.   Past Medical History:  Past Medical History:  Diagnosis Date  . Abnormal liver function tests 02/02/2016  . ADHD 06/10/2016  . Alcohol use 02/23/2016  . Allergy   . Anxiety   . Arm fracture, right    as child, fx in three places, no surgery  . Depression   . Depression with anxiety 04-13-14   PTSD s/p death of best friend   . Essential hypertension 02/02/2016  . GERD (gastroesophageal reflux disease)   . H/O cold sores  04/15/2015  . Headache 04/09/2016  . Hyperlipidemia, mixed 02/02/2016  . Migraine   . Ovarian cyst   . PCOS (polycystic ovarian syndrome)   . Preventative health care 10/27/2015  . Thrombocytosis ([place]) 04/09/2016    Past Surgical History:  Procedure Laterality Date  . ADENOIDECTOMY  2002  . TYMPANOSTOMY TUBE PLACEMENT Bilateral 1999    Family Psychiatric History: mom side has depression , anxiety  Family History:  Family History  Problem Relation Age of Onset  . Hypertension Mother   . Heart disease Mother        CAD, stent at 2, s/p cardiac arrest with Defib  . Hyperlipidemia Mother   . Diabetes Mother        s/p gastric bypass  . Obesity Mother        s/p gastric bypass  . Hyperlipidemia Father   . Hypertension Father   . Asthma Sister   . Hyperlipidemia Brother   . Stroke Maternal Grandmother   . Thyroid disease Maternal Grandmother   . Heart disease Maternal Grandfather        MI first at 68, s/p stents and CABG  . Hyperlipidemia Maternal Grandfather   . Hypertension Maternal Grandfather   . Stroke Maternal Grandfather   . Cancer Maternal Grandfather        melanoma  . Aneurysm Maternal Grandfather        brain  . Kidney disease Maternal Grandfather        tumor  . Heart disease Paternal Grandmother   .  Cancer Paternal Lucilla Edin and brain  . Heart disease Maternal Uncle   . Vision loss Maternal Uncle     Social History:   Social History   Socioeconomic History  . Marital status: Single    Spouse name: Not on file  . Number of children: 0  . Years of education: 59  . Highest education level: Not on file  Occupational History    Comment: [employer]  Social Needs  . Financial resource strain: Not on file  . Food insecurity:    Worry: Not on file    Inability: Not on file  . Transportation needs:    Medical: Not on file    Non-medical: Not on file  Tobacco Use  . Smoking status: Heavy Tobacco Smoker    Packs/day: 1.50    Types:  Cigarettes  . Smokeless tobacco: Current User    Types: Snuff  Substance and Sexual Activity  . Alcohol use: No    Alcohol/week: 0.0 standard drinks    Comment: maybe every 6 months  . Drug use: Yes    Types: Marijuana    Comment: 08/04/17 "weed daily"  . Sexual activity: Yes    Birth control/protection: Pill    Comment: lives with partner, works for [employer], no diewtary restrictions.   Lifestyle  . Physical activity:    Days per week: Not on file    Minutes per session: Not on file  . Stress: Not on file  Relationships  . Social connections:    Talks on phone: Not on file    Gets together: Not on file    Attends religious service: Not on file    Active member of club or organization: Not on file    Attends meetings of clubs or organizations: Not on file    Relationship status: Not on file  Other Topics Concern  . Not on file  Social History Narrative   Lives with girlfriend's family   Caffeine- soda and tea, 6 cans daily      Allergies:   Allergies  Allergen Reactions  . Oxycodone Nausea Only    Oxycontin also  . Seroquel [Quetiapine Fumarate]     Mental status changes    Metabolic Disorder Labs: Lab Results  Component Value Date   HGBA1C 5.5 08/04/2017   No results found for: PROLACTIN Lab Results  Component Value Date   CHOL 218 (H) 05/05/2017   TRIG 107.0 05/05/2017   HDL 42.00 05/05/2017   CHOLHDL 5 05/05/2017   VLDL 21.4 05/05/2017   LDLCALC 155 (H) 05/05/2017     Current Medications: Current Outpatient Medications  Medication Sig Dispense Refill  . albuterol (PROVENTIL HFA;VENTOLIN HFA) 108 (90 Base) MCG/ACT inhaler Inhale 2 puffs into the lungs every 6 (six) hours as needed for wheezing or shortness of breath. 3 Inhaler 0  . amphetamine-dextroamphetamine (ADDERALL) 10 MG tablet Take 1 tablet (10 mg total) by mouth 2 (two) times daily with a meal. February 2020 60 tablet 0  . amphetamine-dextroamphetamine (ADDERALL) 10 MG tablet Take 1  tablet (10 mg total) by mouth 2 (two) times daily with a meal. January 2020 60 tablet 0  . amphetamine-dextroamphetamine (ADDERALL) 10 MG tablet Take 1 tablet (10 mg total) by mouth 2 (two) times daily with a meal. December 2019 60 tablet 0  . busPIRone (BUSPAR) 10 MG tablet TAKE ONE TABLET BY MOUTH THREE TIMES A DAY 90 tablet 0  . Dexlansoprazole 30 MG capsule  Take 30 mg by mouth daily.    . fluconazole (DIFLUCAN) 150 MG tablet Take 1 tablet (150 mg total) by mouth once a week. 2 tablet 1  . lamoTRIgine (LAMICTAL) 25 MG tablet Take 1 tablet (25 mg total) by mouth daily. Take 2 a day. 180 tablet 0  . Levonorgestrel-Ethinyl Estradiol (AMETHIA,CAMRESE) 0.15-0.03 &0.01 MG tablet TAKE 1 TABLET BY MOUTH EVERY DAY 91 tablet 1  . mometasone-formoterol (DULERA) 100-5 MCG/ACT AERO Inhale 2 puffs into the lungs 2 (two) times daily. 3 Inhaler 1  . montelukast (SINGULAIR) 10 MG tablet Take 1 tablet (10 mg total) by mouth at bedtime. 90 tablet 1  . neomycin-polymyxin-hydrocortisone (CORTISPORIN) OTIC solution Place 3 drops into the right ear 3 (three) times daily. 10 mL 1  . valACYclovir (VALTREX) 1000 MG tablet Take 1 tablet (1,000 mg total) by mouth 2 (two) times daily. 60 tablet 5  . venlafaxine XR (EFFEXOR-XR) 75 MG 24 hr capsule TAKE ONE CAPSULE BY MOUTH EVERY MORNING WITH BREAKFAST 90 capsule 0   No current facility-administered medications for this visit.      Psychiatric Specialty Exam: Review of Systems  HENT: Negative for hearing loss.   Cardiovascular: Negative for chest pain.  Skin: Negative for rash.  Neurological: Negative for tremors.  Psychiatric/Behavioral: Positive for substance abuse. Negative for suicidal ideas.    Blood pressure 122/80, pulse 77, height 5\' 8"  (1.727 m), weight 169 lb (76.7 kg).Body mass index is 25.7 kg/m.  General Appearance: Casual  Eye Contact:  Fair  Speech:  Normal Rate  Volume:  Normal  Mood: fair  Affect: better  Thought Process:  Goal Directed   Orientation:  Full (Time, Place, and Person)  Thought Content:  Rumination  Suicidal Thoughts:  No  Homicidal Thoughts:  No  Memory:  Immediate;   Fair Recent;   Fair  Judgement:  Poor  Insight:  Shallow  Psychomotor Activity:  Normal  Concentration:  Concentration: Fair and Attention Span: Fair  Recall:  [employer] of Knowledge:Good  Language: Good  Akathisia:  No  Handed:  Right  AIMS (if indicated):    Assets:  Desire for Improvement  ADL's:  Intact  Cognition: WNL  Sleep:  variable    Treatment Plan Summary: Medication management and Plan as follows  1. MOOd disorder, possible bipolar disorder, mixed or depressed type Fair. Continue lamictal  2. GAD better. Continue effexor, buspar. Avoid marijuana 3. THC use: have cut down. Encouraged abstinence 4. Alcohol use : remains sober. Discussed relapse prevention and to avoid alcohol and any benzodiazepines. 5.ADHD: manageable on 10mg  once or bid. Gets from primary care. Recommend to keep dose low Walk and water instead of coffee exces  Call for lamictal refill Fu 37m. No rash   Merian Capron, MD 12/18/20199:57 AM

## 2018-02-03 ENCOUNTER — Ambulatory Visit (INDEPENDENT_AMBULATORY_CARE_PROVIDER_SITE_OTHER): Payer: Managed Care, Other (non HMO) | Admitting: Internal Medicine

## 2018-02-03 ENCOUNTER — Encounter: Payer: Self-pay | Admitting: Internal Medicine

## 2018-02-03 VITALS — BP 126/70 | HR 78 | Temp 98.2°F | Resp 16 | Ht 68.0 in | Wt 168.5 lb

## 2018-02-03 DIAGNOSIS — K429 Umbilical hernia without obstruction or gangrene: Secondary | ICD-10-CM

## 2018-02-03 DIAGNOSIS — R1909 Other intra-abdominal and pelvic swelling, mass and lump: Secondary | ICD-10-CM

## 2018-02-03 MED ORDER — CEPHALEXIN 500 MG PO CAPS: 500.0000 mg | ORAL_CAPSULE | Freq: Four times a day (QID) | ORAL | 0 refills | Status: DC

## 2018-02-03 NOTE — Progress Notes (Signed)
Pre visit review using our clinic review tool, if applicable. No additional management support is needed unless otherwise documented below in the visit note. 

## 2018-02-03 NOTE — Progress Notes (Signed)
Subjective:    Patient ID: Kathryn [name], female    DOB: April 21, 1995, 22 y.o.   MRN: [id number]  DOS:  02/03/2018 Type of visit - description : Acute Symptoms of started 3 days ago, this is not the first time this happens.   Previously, she developed a lump in the same place, and it spontaneously draining pus and blood. The second time, it was drained and only blood was obtained. The third time, she saw a surgeon and apparently the area was cauterized.  Current symptoms started 3 days ago with lump at the umbilical area, it is very tender.  She has not seen any drainage. Pain increased with Valsalva maneuver.   Review of Systems Denies fever chills She has occasional nausea and chronic diarrhea which are at baseline.  No vomiting.  Past Medical History:  Diagnosis Date  . Abnormal liver function tests 02/02/2016  . ADHD 06/10/2016  . Alcohol use 02/23/2016  . Allergy   . Anxiety   . Arm fracture, right    as child, fx in three places, no surgery  . Depression   . Depression with anxiety April 22, 2014   PTSD s/p death of best friend   . Essential hypertension 02/02/2016  . GERD (gastroesophageal reflux disease)   . 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 Headache 04/09/2016  . Hyperlipidemia, mixed 02/02/2016  . Migraine   . Ovarian cyst   . PCOS (polycystic ovarian syndrome)   . Preventative health care 10/27/2015  . Thrombocytosis ([place]) 04/09/2016    Past Surgical History:  Procedure Laterality Date  . ADENOIDECTOMY  2002  . TYMPANOSTOMY TUBE PLACEMENT Bilateral 1999    Social History   Socioeconomic History  . Marital status: Single    Spouse name: Not on file  . Number of children: 0  . Years of education: 66  . Highest education level: Not on file  Occupational History    Comment: [employer]  Social Needs  . Financial resource strain: Not on file  . Food insecurity:    Worry: Not on file    Inability: Not on file  . Transportation needs:    Medical: Not on file   Non-medical: Not on file  Tobacco Use  . Smoking status: Heavy Tobacco Smoker    Packs/day: 1.50    Types: Cigarettes  . Smokeless tobacco: Current User    Types: Snuff  Substance and Sexual Activity  . Alcohol use: No    Alcohol/week: 0.0 standard drinks    Comment: maybe every 6 months  . Drug use: Yes    Types: Marijuana    Comment: 08/04/17 "weed daily"  . Sexual activity: Yes    Birth control/protection: Pill    Comment: lives with partner, works for [employer], no diewtary restrictions.   Lifestyle  . Physical activity:    Days per week: Not on file    Minutes per session: Not on file  . Stress: Not on file  Relationships  . Social connections:    Talks on phone: Not on file    Gets together: Not on file    Attends religious service: Not on file    Active member of club or organization: Not on file    Attends meetings of clubs or organizations: Not on file    Relationship status: Not on file  . Intimate partner violence:    Fear of current or ex partner: Not on file    Emotionally abused: Not on file    Physically  abused: Not on file    Forced sexual activity: Not on file  Other Topics Concern  . Not on file  Social History Narrative   Lives with girlfriend's family   Caffeine- soda and tea, 6 cans daily      Allergies as of 02/03/2018      Reactions   Oxycodone Nausea Only   Oxycontin also   Seroquel [quetiapine Fumarate]    Mental status changes      Medication List       Accurate as of February 03, 2018  6:00 PM. Always use your most recent med list.        albuterol 108 (90 Base) MCG/ACT inhaler Commonly known as:  PROVENTIL HFA;VENTOLIN HFA Inhale 2 puffs into the lungs every 6 (six) hours as needed for wheezing or shortness of breath.   amphetamine-dextroamphetamine 10 MG tablet Commonly known as:  ADDERALL Take 1 tablet (10 mg total) by mouth 2 (two) times daily with a meal. February 2020   amphetamine-dextroamphetamine 10 MG  tablet Commonly known as:  ADDERALL Take 1 tablet (10 mg total) by mouth 2 (two) times daily with a meal. January 2020   amphetamine-dextroamphetamine 10 MG tablet Commonly known as:  ADDERALL Take 1 tablet (10 mg total) by mouth 2 (two) times daily with a meal. December 2019   busPIRone 10 MG tablet Commonly known as:  BUSPAR TAKE ONE TABLET BY MOUTH THREE TIMES A DAY   cephALEXin 500 MG capsule Commonly known as:  KEFLEX Take 1 capsule (500 mg total) by mouth 4 (four) times daily.   Dexlansoprazole 30 MG capsule Take 30 mg by mouth daily.   fluconazole 150 MG tablet Commonly known as:  DIFLUCAN Take 1 tablet (150 mg total) by mouth once a week.   lamoTRIgine 25 MG tablet Commonly known as:  LAMICTAL Take 1 tablet (25 mg total) by mouth daily. Take 2 a day.   Levonorgestrel-Ethinyl Estradiol 0.15-0.03 &0.01 MG tablet Commonly known as:  AMETHIA,CAMRESE TAKE 1 TABLET BY MOUTH EVERY DAY   mometasone-formoterol 100-5 MCG/ACT Aero Commonly known as:  DULERA Inhale 2 puffs into the lungs 2 (two) times daily.   montelukast 10 MG tablet Commonly known as:  SINGULAIR Take 1 tablet (10 mg total) by mouth at bedtime.   neomycin-polymyxin-hydrocortisone OTIC solution Commonly known as:  CORTISPORIN Place 3 drops into the right ear 3 (three) times daily.   valACYclovir 1000 MG tablet Commonly known as:  VALTREX Take 1 tablet (1,000 mg total) by mouth 2 (two) times daily.   venlafaxine XR 75 MG 24 hr capsule Commonly known as:  EFFEXOR-XR TAKE ONE CAPSULE BY MOUTH EVERY MORNING WITH BREAKFAST           Objective:   Physical Exam BP 126/70 (BP Location: Left Arm, Patient Position: Sitting, Cuff Size: Small)   Pulse 78   Temp 98.2 F (36.8 C) (Oral)   Resp 16   Ht 5\' 8"  (1.727 m)   Wt 168 lb 8 oz (76.4 kg)   LMP 02/03/2018 (Exact Date)   SpO2 98%   BMI 25.62 kg/m  General:   Well developed, NAD, BMI noted.  HEENT:  Normocephalic . Face symmetric, atraumatic   Abdomen:  Not distended, soft, good bowel sounds. Umbilicus: She has a ~ 1 cm soft, tender mass.  See picture.  Underneath, I actually cannot feel a hernia ring. Skin: Not pale. Not jaundice Neurologic:  alert & oriented X3.  Speech normal, gait appropriate for age and  unassisted Psych--  Cognition and judgment appear intact.  Cooperative with normal attention span and concentration.  Behavior appropriate. No anxious or depressed appearing.       Assessment & Plan:   22 year old female PMH includes asthma, ADHD,  anxiety, GERD, presents with Umbilical mass: The mass is soft but tender, I am not exactly sure if this is small hernia versus a cyst or abscess. I contacted the surgical office and I appreciate their help, she will be seen tomorrow at 11 AM.  Until then she will go to the ER if she is worse.  Will start Keflex empirically.

## 2018-02-03 NOTE — Patient Instructions (Signed)
You have an appointment to see Dr. Ninfa Linden, tomorrow at 11 AM.  Please arrive there at 10:30 AM. Address: [address], [place], [place] [address]  Phone: [phone]   Until then: Start taking antibiotic called Keflex ER if fever, chills, sudden growth or increased pain at the  area.

## 2018-02-04 ENCOUNTER — Other Ambulatory Visit: Payer: Self-pay | Admitting: Surgery

## 2018-02-06 ENCOUNTER — Emergency Department: Admit: 2018-02-07 | Payer: PRIVATE HEALTH INSURANCE

## 2018-02-06 DIAGNOSIS — L02216 Cutaneous abscess of umbilicus: Secondary | ICD-10-CM

## 2018-02-06 NOTE — Progress Notes (Signed)
Patient resting comfortably. Patient and family provided blankets. No needs voiced at this time. Call bell within reach.

## 2018-02-06 NOTE — ED Triage Notes (Signed)
Visiting form [place]. Diagnosed with umlical hernia on Tuesday and referred to a surgeon who she saw on Thursday. Came in due to hernia pain unrelieved from Tramadol. +nausea

## 2018-02-06 NOTE — ED Notes (Signed)
[place]. Diagnosed with umlical hernia on Tuesday and referred to a surgeon who she saw on Thursday. Came in due to hernia pain unrelieved from Tramadol. +nausea

## 2018-02-06 NOTE — Progress Notes (Signed)
Patient resting comfortably. Patient and family provided blankets. No needs voiced at this time. Call bell within reach.

## 2018-02-07 ENCOUNTER — Inpatient Hospital Stay
Admit: 2018-02-07 | Discharge: 2018-02-07 | Disposition: A | Discharge status: Home / Self Care | Payer: PRIVATE HEALTH INSURANCE | Attending: Emergency Medicine

## 2018-02-07 LAB — METABOLIC PANEL, BASIC
Anion gap: 5 mmol/L (ref 5–15)
BUN: 8 mg/dl (ref 7–25)
CO2: 28 mEq/L (ref 21–32)
Calcium: 8.9 mg/dl (ref 8.5–10.1)
Chloride: 105 mEq/L (ref 98–107)
Creatinine: 0.8 mg/dl (ref 0.6–1.3)
GFR est AA: >60
GFR est non-AA: >60
Glucose: 90 mg/dl (ref 74–106)
Potassium: 3.4 mEq/L — ABNORMAL LOW (ref 3.5–5.1)
Sodium: 138 mEq/L (ref 136–145)

## 2018-02-07 LAB — POC CHEM8
BUN: 8 mg/dl (ref 7–25)
BUN: 8 mg/dl (ref 7–25)
CALCIUM,IONIZED: 4.5 mg/dL (ref 4.40–5.40)
CALCIUM,IONIZED: 4.5 mg/dL (ref 4.40–5.40)
CO2 Total: 27 mmol/L (ref 21–32)
CO2, TOTAL: 27 mmol/L (ref 21–32)
Chloride: 101 mEq/L (ref 98–107)
Chloride: 101 mEq/L (ref 98–107)
Creatinine: 0.8 mg/dl (ref 0.6–1.3)
Creatinine: 0.8 mg/dl (ref 0.6–1.3)
Glucose: 91 mg/dL (ref 74–106)
Glucose: 91 mg/dL (ref 74–106)
HCT: 45 % (ref 38–45)
HGB: 15.3 gm/dl (ref 13.0–17.2)
Hematocrit: 45 % (ref 38–45)
Hemoglobin: 15.3 gm/dl (ref 13.0–17.2)
Potassium: 3.5 mEq/L (ref 3.5–4.9)
Potassium: 3.5 mEq/L (ref 3.5–4.9)
Sodium: 139 mEq/L (ref 136–145)
Sodium: 139 mEq/L (ref 136–145)

## 2018-02-07 LAB — CBC WITH AUTOMATED DIFF
BASOPHILS: 0.4 % (ref 0–3)
EOSINOPHILS: 2 % (ref 0–5)
HCT: 41.9 % (ref 37.0–50.0)
HGB: 14.1 gm/dl (ref 13.0–17.2)
IMMATURE GRANULOCYTES: 0.4 % (ref 0.0–3.0)
LYMPHOCYTES: 17.1 % — ABNORMAL LOW (ref 28–48)
MCH: 32.3 pg (ref 25.4–34.6)
MCHC: 33.7 gm/dl (ref 30.0–36.0)
MCV: 95.9 fL (ref 80.0–98.0)
MONOCYTES: 9 % (ref 1–13)
MPV: 9.4 fL (ref 6.0–10.0)
NEUTROPHILS: 71.1 % — ABNORMAL HIGH (ref 34–64)
NRBC: 0 (ref 0–0)
PLATELET: 365 10*3/uL (ref 140–450)
RBC: 4.37 M/uL (ref 3.60–5.20)
RDW-SD: 43.1 (ref 36.4–46.3)
WBC: 12.8 10*3/uL — ABNORMAL HIGH (ref 4.0–11.0)

## 2018-02-07 LAB — POC URINE MACROSCOPIC
Bilirubin, Urine: NEGATIVE
Bilirubin: NEGATIVE
Glucose, Ur: NEGATIVE mg/dl
Glucose: NEGATIVE mg/dl
Ketone: NEGATIVE mg/dl
Ketones, Urine: NEGATIVE mg/dl
Leukocyte Esterase, Urine: NEGATIVE
Leukocyte Esterase: NEGATIVE
Nitrite, Urine: NEGATIVE
Nitrites: NEGATIVE
Protein, UA: NEGATIVE mg/dl
Protein: NEGATIVE mg/dl
Specific Gravity, UA: 1.02 (ref 1.005–1.030)
Specific gravity: 1.02 (ref 1.005–1.030)
Urobilinogen, UA, POCT: 1 EU/dl (ref 0.0–1.0)
Urobilinogen: 1 EU/dl (ref 0.0–1.0)
pH (UA): 6 (ref 5–9)
pH, UA: 6 (ref 5–9)

## 2018-02-07 LAB — LACTIC ACID
LACTIC ACID: 0.5 mmol/L (ref 0.4–2.0)
Lactic Acid: 0.5 mmol/L (ref 0.4–2.0)

## 2018-02-07 LAB — POC HCG,URINE
HCG urine, QL: NEGATIVE
Pregnancy Test(Urn): NEGATIVE

## 2018-02-07 LAB — CBC WITH AUTO DIFFERENTIAL
Basophils %: 0.4 % (ref 0–3)
Eosinophils %: 2 % (ref 0–5)
Hematocrit: 41.9 % (ref 37.0–50.0)
Hemoglobin: 14.1 gm/dl (ref 13.0–17.2)
Immature Granulocytes: 0.4 % (ref 0.0–3.0)
Lymphocytes %: 17.1 % — ABNORMAL LOW (ref 28–48)
MCH: 32.3 pg (ref 25.4–34.6)
MCHC: 33.7 gm/dl (ref 30.0–36.0)
MCV: 95.9 fL (ref 80.0–98.0)
MPV: 9.4 fL (ref 6.0–10.0)
Monocytes %: 9 % (ref 1–13)
Neutrophils %: 71.1 % — ABNORMAL HIGH (ref 34–64)
Nucleated RBCs: 0 (ref 0–0)
Platelets: 365 10*3/uL (ref 140–450)
RBC: 4.37 M/uL (ref 3.60–5.20)
RDW-SD: 43.1 (ref 36.4–46.3)
WBC: 12.8 10*3/uL — ABNORMAL HIGH (ref 4.0–11.0)

## 2018-02-07 LAB — BASIC METABOLIC PANEL
Anion Gap: 5 mmol/L (ref 5–15)
BUN: 8 mg/dl (ref 7–25)
CO2: 28 mEq/L (ref 21–32)
Calcium: 8.9 mg/dl (ref 8.5–10.1)
Chloride: 105 mEq/L (ref 98–107)
Creatinine: 0.8 mg/dl (ref 0.6–1.3)
EGFR IF NonAfrican American: >60
GFR African American: >60
Glucose: 90 mg/dl (ref 74–106)
Potassium: 3.4 mEq/L — ABNORMAL LOW (ref 3.5–5.1)
Sodium: 138 mEq/L (ref 136–145)

## 2018-02-07 MED ORDER — ONDANSETRON (PF) 4 MG/2 ML INJECTION
4 mg/2 mL | Freq: Once | INTRAMUSCULAR | Status: AC
Start: 2018-02-07 — End: 2018-02-07
  Administered 2018-02-07: 07:00:00 via INTRAVENOUS

## 2018-02-07 MED ORDER — IBUPROFEN 600 MG TAB
600 mg | ORAL_TABLET | Freq: Four times a day (QID) | ORAL | 0 refills | Status: AC | PRN
Start: 2018-02-07 — End: ?

## 2018-02-07 MED ORDER — MORPHINE 4 MG/ML SYRINGE
4 mg/mL | INTRAMUSCULAR | Status: AC
Start: 2018-02-07 — End: 2018-02-07
  Administered 2018-02-07: 07:00:00 via INTRAVENOUS

## 2018-02-07 MED ORDER — ONDANSETRON (PF) 4 MG/2 ML INJECTION
4 mg/2 mL | INTRAMUSCULAR | Status: DC
Start: 2018-02-07 — End: 2018-02-07

## 2018-02-07 MED ORDER — MORPHINE 4 MG/ML SYRINGE
4 mg/mL | INTRAMUSCULAR | Status: AC
Start: 2018-02-07 — End: 2018-02-06
  Administered 2018-02-07: 04:00:00 via INTRAVENOUS

## 2018-02-07 MED ORDER — MORPHINE 4 MG/ML SYRINGE
4 mg/mL | INTRAMUSCULAR | Status: DC
Start: 2018-02-07 — End: 2018-02-07

## 2018-02-07 MED ORDER — ONDANSETRON 4 MG TAB, RAPID DISSOLVE
4 mg | ORAL_TABLET | Freq: Three times a day (TID) | ORAL | 0 refills | Status: AC | PRN
Start: 2018-02-07 — End: ?

## 2018-02-07 MED ORDER — HYDROCODONE-ACETAMINOPHEN 5 MG-325 MG TAB
5-325 mg | ORAL_TABLET | Freq: Four times a day (QID) | ORAL | 0 refills | Status: DC | PRN
Start: 2018-02-07 — End: 2018-02-07

## 2018-02-07 MED ORDER — SODIUM CHLORIDE 0.9 % IJ SYRG
Freq: Once | INTRAMUSCULAR | Status: AC
Start: 2018-02-07 — End: 2018-02-07
  Administered 2018-02-07: 07:00:00 via INTRAVENOUS

## 2018-02-07 MED ORDER — ONDANSETRON (PF) 4 MG/2 ML INJECTION
4 mg/2 mL | Freq: Once | INTRAMUSCULAR | Status: AC
Start: 2018-02-07 — End: 2018-02-06
  Administered 2018-02-07: 04:00:00 via INTRAVENOUS

## 2018-02-07 MED ORDER — SODIUM CHLORIDE 0.9% BOLUS IV
0.9 % | INTRAVENOUS | Status: AC
Start: 2018-02-07 — End: 2018-02-07
  Administered 2018-02-07: 04:00:00 via INTRAVENOUS

## 2018-02-07 MED ORDER — SODIUM CHLORIDE 0.9 % IJ SYRG
Freq: Once | INTRAMUSCULAR | Status: AC
Start: 2018-02-07 — End: 2018-02-07
  Administered 2018-02-07: 05:00:00 via INTRAVENOUS

## 2018-02-07 MED ORDER — IOPAMIDOL 76 % IV SOLN
370 mg iodine /mL (76 %) | Freq: Once | INTRAVENOUS | Status: AC
Start: 2018-02-07 — End: 2018-02-07
  Administered 2018-02-07: 05:00:00 via INTRAVENOUS

## 2018-02-07 MED ORDER — CLINDAMYCIN 300 MG CAP
300 mg | ORAL_CAPSULE | Freq: Three times a day (TID) | ORAL | 0 refills | Status: AC
Start: 2018-02-07 — End: 2018-02-17

## 2018-02-07 MED ORDER — OXYCODONE-ACETAMINOPHEN 5 MG-325 MG TAB
5-325 mg | ORAL_TABLET | Freq: Four times a day (QID) | ORAL | 0 refills | Status: AC | PRN
Start: 2018-02-07 — End: 2018-02-10

## 2018-02-07 MED FILL — ONDANSETRON (PF) 4 MG/2 ML INJECTION: 4 mg/2 mL | INTRAMUSCULAR | Qty: 2

## 2018-02-07 MED FILL — ISOVUE-370  76 % INTRAVENOUS SOLUTION: 370 mg iodine /mL (76 %) | INTRAVENOUS | Qty: 80

## 2018-02-07 MED FILL — SODIUM CHLORIDE 0.9 % IV: INTRAVENOUS | Qty: 1000

## 2018-02-07 MED FILL — MORPHINE 4 MG/ML SYRINGE: 4 mg/mL | INTRAMUSCULAR | Qty: 2

## 2018-02-07 MED FILL — MORPHINE 4 MG/ML SYRINGE: 4 mg/mL | INTRAMUSCULAR | Qty: 1

## 2018-02-07 NOTE — Other (Signed)
LAB- Patient called back wanting to know her wound culture results. She was informed that it was positive for bacteria, and to continue taking her clindamycin. She stated that she was feeling so much better. She was advised to follow up with her PCP.

## 2018-02-07 NOTE — ED Notes (Signed)
2:12 AM  02/07/18     Discharge instructions given to patient (name) with verbalization of understanding. Patient accompanied by family.  Patient discharged with the following prescriptions see AVS. Patient discharged to home (destination).      Evan R Mack

## 2018-02-07 NOTE — ED Provider Notes (Signed)
[facility]  Emergency Department Treatment Report        Patient: Loretta Lucas Age: 22 y.o. Sex: female    Date of Birth: 1996-02-11 Admit Date: 02/06/2018 PCP: None   MRN: [id number]  CSN: [id number]  Attending: Smitty Cords   Room: 111/EO11 Time Dictated: 2:26 AM APP: Lockie Pares       Chief Complaint   Chief Complaint   Patient presents with   ??? Umbilical Hernia       History of Present Illness     This is a 22 y.o. female with history of recurrent protrusion from her umbilicus 4 times in the last year.  Once it spontaneously drained purulent fluid, but more recently she was seen on the 19th by a PCP for this and they referred her to a surgeon who saw her the next day.  They were not comfortable performing any procedures until CT returned but patient's pain is increased and has outpaced her Ultram prescription so she presents to the emergency department for further evaluation as, although her surgeon is in [place], she is here visiting family for the holidays.  Patient denies fevers or chills, pain does induce nausea at times and she endorses that with Valsalva maneuvers the pain increases.    Review of Systems     Constitutional:  No fevers or chills.  Eyes: No visual symptoms.  ENT: No sore throat, runny nose or ear pain.  Respiratory: No cough, dyspnea or wheezing.  Cardoivascular: No chest pain, pressure, palpitations, tightness or heaviness.  Gastrointestinal: Umbilical pain radiating diffusely throughout the abdomen with an obvious umbilicus source.  Constant, exacerbated by twisting turning bending pressing on the umbilicus inspiration cough etc.  No relieving factors including Ultram.  No associated vomiting or bowel movement changes.  Genitourinary: No dysuria, frequency, or urgency.  Musculoskeletal: No joint pain or swelling.  Integumentary: No rashes.  Neurological: No headaches, sensory or motor symptoms.      Past Medical/Surgical History     Past Medical History:    Diagnosis Date   ??? Umbilical hernia      History reviewed. No pertinent surgical history.    Social History     Social History     Socioeconomic History   ??? Marital status: SINGLE     Spouse name: Not on file   ??? Number of children: Not on file   ??? Years of education: Not on file   ??? Highest education level: Not on file   Occupational History   ??? Not on file   Social Needs   ??? Financial resource strain: Not on file   ??? Food insecurity:     Worry: Not on file     Inability: Not on file   ??? Transportation needs:     Medical: Not on file     Non-medical: Not on file   Tobacco Use   ??? Smoking status: Never Smoker   ??? Smokeless tobacco: Never Used   Substance and Sexual Activity   ??? Alcohol use: Not Currently   ??? Drug use: Not on file   ??? Sexual activity: Not on file   Lifestyle   ??? Physical activity:     Days per week: Not on file     Minutes per session: Not on file   ??? Stress: Not on file   Relationships   ??? Social connections:     Talks on phone: Not on file     Gets together:  Not on file     Attends religious service: Not on file     Active member of club or organization: Not on file     Attends meetings of clubs or organizations: Not on file     Relationship status: Not on file   ??? Intimate partner violence:     Fear of current or ex partner: Not on file     Emotionally abused: Not on file     Physically abused: Not on file     Forced sexual activity: Not on file   Other Topics Concern   ??? Not on file   Social History Narrative   ??? Not on file       Family History   History reviewed. No pertinent family history.    Current Medications     Prior to Admission Medications   Prescriptions Last Dose Informant Patient Reported? Taking?   traMADol (ULTRAM) 50 mg tablet   Yes No   Sig: Take 50 mg by mouth every six (6) hours as needed for Pain.      Facility-Administered Medications: None       Allergies     Allergies   Allergen Reactions   ??? Oxycodone Nausea and Vomiting       Physical Exam     ED Triage Vitals    ED Encounter Vitals Group      BP 02/06/18 2216 145/84      Pulse (Heart Rate) 02/06/18 2216 (!) 105      Resp Rate 02/06/18 2216 16      Temp 02/06/18 2216 98.2 ??F (36.8 ??C)      Temp src --       O2 Sat (%) 02/06/18 2216 100 %      Weight 02/06/18 2209 168 lb      Height 02/06/18 2209 5\' 8"      Constitutional: Patient appears well developed and well nourished. Marland Kitchen Appearance and behavior are age and situation appropriate.  HEENT: Conjunctiva clear.  PERRLA. Mucous membranes moist, non-erythematous. Surface of the pharynx, palate, and tongue are pink, moist and without lesions.  Neck: supple, non tender, symmetrical, no masses or JVD.   Respiratory: lungs clear to auscultation, nonlabored respirations. No tachypnea or accessory muscle use.  Cardiovascular: heart regular rate and rhythm without murmur rubs or gallops.   Calves soft and non-tender. Distal pulses 2+ and equal bilaterally.  No peripheral edema or significant variscosities.    Gastrointestinal: Well-circumscribed protrusion to the umbilicus, exquisitely tender, mildly erythematous, fluctuant, with overlying somewhat friable tissue.  Mild surrounding induration.  No other abdominal tenderness rebound or guarding.  Patient ambulatory.  Musculoskeletal: Nail beds pink with prompt capillary refill  Integumentary: warm and dry without rashes or lesions  Neurologic: alert and oriented, Sensation intact, motor strength equal and symmetric.  No facial asymmetry or dysarthria.           Impression and Management Plan     Patient with what I suspect is an umbilical abscess, obtain CT to evaluate for small bowel herniation fat herniation etc.  We will medicate symptomatically.  No systemic symptoms of infection other than nausea which she and I attribute to the discomfort itself.    I&D Abcess Complex  Date/Time: 02/07/2018 2:29 AM  Performed by: Renella Cunas, PA-C  Authorized by: Renella Cunas, PA-C     Consent:     Consent obtained:  Verbal     Consent given by:  Patient and parent  Risks discussed:  Incomplete drainage, infection and damage to other organs (damage to intestines, etc.)  Location:     Type:  Abscess (umbilical)    Location:  Trunk    Trunk location:  Abdomen  Pre-procedure details:     Skin preparation:  Betadine  Anesthesia (see MAR for exact dosages):     Anesthesia method:  Local infiltration    Local anesthetic:  Lidocaine 2% w/o epi  Procedure type:     Complexity:  Complex  Procedure details:     Needle aspiration: yes      Needle size:  18 G    Incision types:  Stab incision    Incision depth:  Subcutaneous    Scalpel blade:  11    Wound management:  Probed and deloculated, extensive cleaning and irrigated with saline    Drainage:  Purulent    Drainage amount:  Copious    Wound treatment:  Wound left open  Comments:      Attempted to place quarter-inch iodoform gauze, after second dose of morphine and injecting local anesthesia.  Discussed additional attempts as I was not able to pack it is deeply enough to prevent it from falling out of the wound without patient's pain becoming intolerable.  Offered additional analgesia or local injections but patient and mother preferred watching and warm compresses.           Diagnostic Studies   LAB/IMAGING:   Results for orders placed or performed during the hospital encounter of 02/06/18   CBC WITH AUTOMATED DIFF   Result Value Ref Range    WBC 12.8 (H) 4.0 - 11.0 1000/mm3    RBC 4.37 3.60 - 5.20 M/uL    HGB 14.1 13.0 - 17.2 gm/dl    HCT 16.141.9 09.637.0 - 04.550.0 %    MCV 95.9 80.0 - 98.0 fL    MCH 32.3 25.4 - 34.6 pg    MCHC 33.7 30.0 - 36.0 gm/dl    PLATELET 409365 811140 - 914450 1000/mm3    MPV 9.4 6.0 - 10.0 fL    RDW-SD 43.1 36.4 - 46.3      NRBC 0 0 - 0      IMMATURE GRANULOCYTES 0.4 0.0 - 3.0 %    NEUTROPHILS 71.1 (H) 34 - 64 %    LYMPHOCYTES 17.1 (L) 28 - 48 %    MONOCYTES 9.0 1 - 13 %    EOSINOPHILS 2.0 0 - 5 %    BASOPHILS 0.4 0 - 3 %   METABOLIC PANEL, BASIC   Result Value Ref Range     Sodium 138 136 - 145 mEq/L    Potassium 3.4 (L) 3.5 - 5.1 mEq/L    Chloride 105 98 - 107 mEq/L    CO2 28 21 - 32 mEq/L    Glucose 90 74 - 106 mg/dl    BUN 8 7 - 25 mg/dl    Creatinine 0.8 0.6 - 1.3 mg/dl    GFR est AA >78.2>60.0      GFR est non-AA >60      Calcium 8.9 8.5 - 10.1 mg/dl    Anion gap 5 5 - 15 mmol/L   LACTIC ACID   Result Value Ref Range    Lactic Acid 0.5 0.4 - 2.0 mmol/L   POC CHEM8   Result Value Ref Range    Sodium 139 136 - 145 mEq/L    Potassium 3.5 3.5 - 4.9 mEq/L    Chloride 101 98 - 107  mEq/L    CO2, TOTAL 27 21 - 32 mmol/L    Glucose 91 74 - 106 mg/dL    BUN 8 7 - 25 mg/dl    Creatinine 0.8 0.6 - 1.3 mg/dl    HCT 45 38 - 45 %    HGB 15.3 13.0 - 17.2 gm/dl    CALCIUM,IONIZED 5.404.50 4.40 - 5.40 mg/dL   POC URINE MACROSCOPIC   Result Value Ref Range    Glucose Negative NEGATIVE,Negative mg/dl    Bilirubin Negative NEGATIVE,Negative      Ketone Negative NEGATIVE,Negative mg/dl    Specific gravity 9.8111.020 1.005 - 1.030      Blood Moderate (A) NEGATIVE,Negative      pH (UA) 6.0 5 - 9      Protein Negative NEGATIVE,Negative mg/dl    Urobilinogen 1.0 0.0 - 1.0 EU/dl    Nitrites Negative NEGATIVE,Negative      Leukocyte Esterase Negative NEGATIVE,Negative      Color Yellow      Appearance Clear     POC HCG,URINE   Result Value Ref Range    HCG urine, QL negative NEGATIVE,Negative,negative       CT Abdomen/pelvis:  There is a periumbilical subcutaneous fat fluid collection measuring approximately 1.8 x 3.2 cm. There is surrounding subcutaneous fat edema. NO obvious bowel hernia is seen. A study with oral contrast could be performed to assess for bowel herniation. It also could reflect a hematoma or abscess.  No evidence of bowel obstruction.  There is otherwise a normal appearance of the organs of the abdomen and of bowel.  The appendix appears normal.    Impression:  Periumbilical subcutaneous fat fluid collection, favored to reflect a hematoma or abscess. There is no obvious bowel herniation though the  possibility of a small bowel hernia is not entirely excluded.  This could be further assessed with a study with oral contrast as indicated.   No evidence of bowel obstruction.    Electronically signed on Feb 07, 2018 1:11:52 AM EST by:   Renita Papahomas Bryce MD, Ph.D.   Diplomate, Biomedical engineerAmerican Board of Radiology      EKG      Medical Decision Making/ ED Course     Pt remained stable in the emergency department, did not develop other symptoms, informed of results, and discharged in stable condition.    We will place patient on Percocet with Zofran as she has developed nausea with this in the past.  Placed on Motrin, clindamycin instead of Keflex, wound culture sent results pending She was given copy of CT imaging on CT..       Meds given:  Medications   sodium chloride 0.9 % bolus infusion 1,000 mL (0 mL IntraVENous IV Completed 02/07/18 0022)   ondansetron (ZOFRAN) injection 4 mg (4 mg IntraVENous Given 02/06/18 2301)   morphine injection 6 mg (6 mg IntraVENous Given 02/06/18 2301)   sodium chloride (NS) flush 5-10 mL (10 mL IntraVENous Given 02/07/18 0147)   iopamidol (ISOVUE-370) 76 % injection 80 mL (80 mL IntraVENous Given 02/07/18 0004)   sodium chloride (NS) flush 5-10 mL (10 mL IntraVENous Given 02/07/18 0004)   morphine injection 4 mg (4 mg IntraVENous Given 02/07/18 0147)   ondansetron (ZOFRAN) injection 4 mg (4 mg IntraVENous Given 02/07/18 0147)       Final Diagnosis         ICD-10-CM ICD-9-CM   1. Abscess, umbilical L02.216 682.2       Disposition     Disposition  and plan  Patient was discharged home in stable condition with discharge instructions on the same.     Return to the ER if condition worsens or new symptoms develop.   Follow up with primary care as discussed.     The patient was personally evaluated by myself and Dr. Arvella Merles who agrees with the above assessment and plan.    Dragon medical dictation software was used for portions of this report. Unintended errors may occur.     Anibal Henderson, MPA, PA-C   February 07, 2018    My signature above authenticates this document and my orders, the final ??  diagnosis (es), discharge prescription (s), and instructions in the Epic ??  record.  If you have any questions please contact [phone].  ??  Nursing notes have been reviewed by the physician/ advanced practice ??  Clinician.

## 2018-02-07 NOTE — ED Provider Notes (Signed)
ED Provider Notes by Renella Cunas, PA-C at 02/07/18 5621                Author: Renella Cunas, PA-C  Service: EMERGENCY  Author Type: Physician Assistant       Filed: 02/07/18 0234  Date of Service: 02/07/18 0212  Status: Attested           Editor: Marolyn Haller (Physician Assistant)  Cosigner: Smitty Cords, MD at 02/09/18 [phone]            Procedure Orders        1. I&D Abcess Complex [[id number]] ordered by Renella Cunas, PA-C                         Attestation signed by Smitty Cords, MD at 02/09/18 [phone]          I, Smitty Cords, MD , have personally seen and examined this patient; I have fully participated in the care of this patient with the advanced practice provider.   I have reviewed and agree with all pertinent clinical information including history, physical exam, studies and the plan.  I have also reviewed and agree with the medications, allergies and past medical history sections for this patient.   Patient had localized redness swelling at the umbilical area.  Abscess  was drained and treated for the cellulitis.   Smitty Cords, MD   February 09, 2018                                    [facility]   Emergency Department Treatment Report                Patient: Loretta Lucas  Age: 22 y.o.  Sex: female          Date of Birth: 29-Sep-1995  Admit Date: 02/06/2018  PCP: None         MRN: [id number]   CSN: [id number]   Attending: Smitty Cords         Room: 111/EO11  Time Dictated: 2:26 AM  APP: Lockie Pares           Chief Complaint      Chief Complaint       Patient presents with        ?  Umbilical Hernia             History of Present Illness        This is a 22 y.o. 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  Once it spontaneously drained purulent fluid, but more recently she was seen on the 19th by a PCP for this and they referred her to a surgeon who saw her the next day.  They were not  comfortable performing any  procedures until CT returned but patient's pain is increased and has outpaced her Ultram prescription so she presents to the emergency department for further evaluation as, although her surgeon is in [place], she is here  visiting family for the holidays.  Patient denies fevers or chills, pain does induce nausea at times and she endorses that with Valsalva maneuvers the pain increases.        Review of Systems        Constitutional:  No fevers or chills.   Eyes:  No visual symptoms.   ENT: No sore throat, runny nose or ear pain.   Respiratory: No cough, dyspnea or wheezing.   Cardoivascular: No chest pain, pressure, palpitations, tightness or heaviness.   Gastrointestinal: Umbilical pain radiating diffusely throughout the abdomen with an obvious umbilicus source.  Constant, exacerbated by twisting turning bending pressing on the umbilicus inspiration cough etc.  No relieving factors including Ultram.   No associated vomiting or bowel movement changes.   Genitourinary: No dysuria, frequency, or urgency.   Musculoskeletal: No joint pain or swelling.   Integumentary: No rashes.   Neurological: No headaches, sensory or motor symptoms.           Past Medical/Surgical History          Past Medical History:        Diagnosis  Date         ?  Umbilical hernia          History reviewed. No pertinent surgical history.        Social History          Social History          Socioeconomic History         ?  Marital status:  SINGLE              Spouse name:  Not on file         ?  Number of children:  Not on file     ?  Years of education:  Not on file     ?  Highest education level:  Not on file       Occupational History        ?  Not on file       Social Needs         ?  Financial resource strain:  Not on file        ?  Food insecurity:              Worry:  Not on file         Inability:  Not on file        ?  Transportation needs:              Medical:  Not on file         Non-medical:  Not on file       Tobacco Use          ?  Smoking status:  Never Smoker     ?  Smokeless tobacco:  Never Used       Substance and Sexual Activity         ?  Alcohol use:  Not Currently     ?  Drug use:  Not on file     ?  Sexual activity:  Not on file       Lifestyle        ?  Physical activity:              Days per week:  Not on file         Minutes per session:  Not on file         ?  Stress:  Not on file       Relationships        ?  Social connections:              Talks on phone:  Not on file  Gets together:  Not on file         Attends religious service:  Not on file         Active member of club or organization:  Not on file         Attends meetings of clubs or organizations:  Not on file         Relationship status:  Not on file        ?  Intimate partner violence:              Fear of current or ex partner:  Not on file         Emotionally abused:  Not on file         Physically abused:  Not on file         Forced sexual activity:  Not on file        Other Topics  Concern        ?  Not on file       Social History Narrative        ?  Not on file             Family History     History reviewed. No pertinent family history.        Current Medications          Prior to Admission Medications     Prescriptions  Last Dose  Informant  Patient Reported?  Taking?      traMADol (ULTRAM) 50 mg tablet      Yes  No      Sig: Take 50 mg by mouth every six (6) hours as needed for Pain.               Facility-Administered Medications: None             Allergies          Allergies        Allergen  Reactions         ?  Oxycodone  Nausea and Vomiting             Physical Exam          ED Triage Vitals     ED Encounter Vitals Group            BP  02/06/18 2216  145/84        Pulse (Heart Rate)  02/06/18 2216  (!) 105        Resp Rate  02/06/18 2216  16        Temp  02/06/18 2216  98.2 ??F (36.8 ??C)        Temp src  --          O2 Sat (%)  02/06/18 2216  100 %        Weight  02/06/18 2209  168 lb            Height  02/06/18 2209  5\' 8"         Constitutional:  Patient appears well developed and well nourished. Marland Kitchen. Appearance and behavior are age and situation appropriate.   HEENT: Conjunctiva clear.  PERRLA. Mucous membranes moist, non-erythematous. Surface of the pharynx, palate, and tongue are pink, moist and  without lesions.   Neck: supple, non tender, symmetrical, no masses or JVD.    Respiratory: lungs clear to auscultation, nonlabored respirations. No tachypnea or accessory muscle use.   Cardiovascular: heart regular rate and rhythm without  murmur rubs or gallops.    Calves soft and non-tender. Distal pulses 2+ and equal bilaterally.  No peripheral edema or significant variscosities.     Gastrointestinal: Well-circumscribed protrusion to the umbilicus, exquisitely tender, mildly erythematous, fluctuant, with overlying somewhat  friable tissue.  Mild surrounding induration.  No other abdominal tenderness rebound or guarding.  Patient ambulatory.   Musculoskeletal: Nail beds pink with prompt capillary refill   Integumentary: warm and dry without rashes or lesions   Neurologic: alert and oriented, Sensation intact, motor strength equal and symmetric.  No facial asymmetry or dysarthria.                  Impression and Management Plan        Patient with what I suspect is an umbilical abscess, obtain CT to evaluate for small bowel herniation fat herniation etc.  We will medicate symptomatically.  No systemic symptoms of infection other than nausea which she and I attribute to the discomfort  itself.       I&D Abcess Complex  Date/Time:  02/07/2018 2:29 AM   Performed by:  Renella CunasHendrick, Kynnedi Zweig P, PA-C   Authorized by:  Renella CunasHendrick, Tristian Bouska P, PA-C       Consent:      Consent obtained:  Verbal     Consent given by:  Patient and parent     Risks discussed:  Incomplete drainage, infection and damage to other organs  (damage to intestines, etc.)   Location:      Type:  Abscess (umbilical)     Location:  Trunk     Trunk location:  Abdomen   Pre-procedure details:      Skin preparation:   Betadine   Anesthesia (see MAR for exact dosages):      Anesthesia method:  Local infiltration     Local anesthetic:  Lidocaine 2% w/o epi   Procedure type:      Complexity:  Complex   Procedure details:     Needle aspiration: yes       Needle size:  18 G     Incision types:  Stab incision     Incision depth:  Subcutaneous     Scalpel blade:  11     Wound management:  Probed and deloculated, extensive cleaning and irrigated with saline     Drainage:  Purulent     Drainage amount:  Copious     Wound treatment:  Wound left open   Comments:       Attempted to place quarter-inch iodoform gauze, after second dose of morphine and injecting local anesthesia.  Discussed additional attempts as I was not able to pack it is deeply enough to  prevent it from falling out of the wound without patient's pain becoming intolerable.  Offered additional analgesia or local injections but patient and mother preferred watching and warm compresses.                  Diagnostic Studies     LAB/IMAGING:      Results for orders placed or performed during the hospital encounter of 02/06/18     CBC WITH AUTOMATED DIFF         Result  Value  Ref Range            WBC  12.8 (H)  4.0 - 11.0 1000/mm3       RBC  4.37  3.60 - 5.20 M/uL       HGB  14.1  13.0 -  17.2 gm/dl       HCT  16.1  09.6 - 50.0 %       MCV  95.9  80.0 - 98.0 fL       MCH  32.3  25.4 - 34.6 pg       MCHC  33.7  30.0 - 36.0 gm/dl       PLATELET  045  409 - 450 1000/mm3       MPV  9.4  6.0 - 10.0 fL       RDW-SD  43.1  36.4 - 46.3         NRBC  0  0 - 0         IMMATURE GRANULOCYTES  0.4  0.0 - 3.0 %       NEUTROPHILS  71.1 (H)  34 - 64 %       LYMPHOCYTES  17.1 (L)  28 - 48 %       MONOCYTES  9.0  1 - 13 %       EOSINOPHILS  2.0  0 - 5 %       BASOPHILS  0.4  0 - 3 %       METABOLIC PANEL, BASIC         Result  Value  Ref Range            Sodium  138  136 - 145 mEq/L       Potassium  3.4 (L)  3.5 - 5.1 mEq/L       Chloride  105  98 - 107 mEq/L       CO2  28  21 - 32 mEq/L        Glucose  90  74 - 106 mg/dl       BUN  8  7 - 25 mg/dl       Creatinine  0.8  0.6 - 1.3 mg/dl       GFR est AA  >81.1          GFR est non-AA  >60          Calcium  8.9  8.5 - 10.1 mg/dl       Anion gap  5  5 - 15 mmol/L       LACTIC ACID         Result  Value  Ref Range            Lactic Acid  0.5  0.4 - 2.0 mmol/L       POC CHEM8         Result  Value  Ref Range            Sodium  139  136 - 145 mEq/L       Potassium  3.5  3.5 - 4.9 mEq/L       Chloride  101  98 - 107 mEq/L       CO2, TOTAL  27  21 - 32 mmol/L       Glucose  91  74 - 106 mg/dL       BUN  8  7 - 25 mg/dl       Creatinine  0.8  0.6 - 1.3 mg/dl       HCT  45  38 - 45 %       HGB  15.3  13.0 - 17.2 gm/dl       CALCIUM,IONIZED  4.50  4.40 - 5.40 mg/dL  POC URINE MACROSCOPIC         Result  Value  Ref Range            Glucose  Negative  NEGATIVE,Negative mg/dl       Bilirubin  Negative  NEGATIVE,Negative              Ketone  Negative  NEGATIVE,Negative mg/dl            Specific gravity  1.020  1.005 - 1.030         Blood  Moderate (A)  NEGATIVE,Negative         pH (UA)  6.0  5 - 9         Protein  Negative  NEGATIVE,Negative mg/dl       Urobilinogen  1.0  0.0 - 1.0 EU/dl       Nitrites  Negative  NEGATIVE,Negative         Leukocyte Esterase  Negative  NEGATIVE,Negative         Color  Yellow          Appearance  Clear          POC HCG,URINE         Result  Value  Ref Range            HCG urine, QL  negative  NEGATIVE,Negative,negative          CT Abdomen/pelvis:  There is a periumbilical subcutaneous fat fluid collection measuring approximately 1.8 x 3.2 cm. There is surrounding subcutaneous fat edema. NO obvious bowel  hernia is seen. A study with oral contrast could be performed to assess for bowel herniation. It also could reflect a hematoma or abscess.  No evidence of bowel obstruction.  There is otherwise a normal appearance of the organs of the abdomen  and of bowel.  The appendix appears normal.    Impression:  Periumbilical subcutaneous fat  fluid collection, favored to reflect a hematoma or abscess. There is no obvious bowel herniation though the possibility of a small bowel hernia is  not entirely excluded.  This could be further assessed with a study with oral contrast as indicated.   No evidence of bowel obstruction.      Electronically signed on Feb 07, 2018 1:11:52 AM EST by:    Renita Papa MD, Ph.D.    Diplomate, Biomedical engineer of Radiology         EKG           Medical Decision Making/ ED Course        Pt remained stable in the emergency department, did not develop other symptoms, informed of results, and discharged in stable condition.      We will place patient on Percocet with Zofran as she has developed nausea with this in the past.  Placed on Motrin, clindamycin instead of Keflex, wound culture sent results pending She was given copy of CT imaging on CT..          Meds given:     Medications       sodium chloride 0.9 % bolus infusion 1,000 mL (0 mL IntraVENous IV Completed 02/07/18 0022)     ondansetron (ZOFRAN) injection 4 mg (4 mg IntraVENous Given 02/06/18 2301)     morphine injection 6 mg (6 mg IntraVENous Given 02/06/18 2301)       sodium chloride (NS) flush 5-10 mL (10 mL IntraVENous Given 02/07/18 0147)       iopamidol (ISOVUE-370) 76 % injection 80  mL (80 mL IntraVENous Given 02/07/18 0004)     sodium chloride (NS) flush 5-10 mL (10 mL IntraVENous Given 02/07/18 0004)     morphine injection 4 mg (4 mg IntraVENous Given 02/07/18 0147)       ondansetron (ZOFRAN) injection 4 mg (4 mg IntraVENous Given 02/07/18 0147)             Final Diagnosis                    ICD-10-CM  ICD-9-CM          1.  Abscess, umbilical  L02.216  682.2             Disposition        Disposition and plan   Patient was discharged home in stable condition with discharge instructions on the same.       Return to the ER if condition worsens or new symptoms develop.    Follow up with primary care as discussed.       The patient was personally evaluated by myself  and Dr. Arvella Merles who agrees with the above assessment and plan.      Dragon medical dictation software was used for portions of this report. Unintended errors may occur.       Anibal Henderson, MPA, PA-C   February 07, 2018      My signature above authenticates this document and my orders, the final ??   diagnosis (es), discharge prescription (s), and instructions in the Epic ??   record.   If you have any questions please contact [phone].   ??   Nursing notes have been reviewed by the physician/ advanced practice ??   Clinician.

## 2018-02-07 NOTE — ED Notes (Signed)
2:12 AM  02/07/18     Discharge instructions given to patient (name) with verbalization of understanding. Patient accompanied by family.  Patient discharged with the following prescriptions see AVS. Patient discharged to home (destination).      Wyvonne LenzEvan R Mack

## 2018-02-12 LAB — CULTURE, WOUND W GRAM STAIN

## 2018-02-14 ENCOUNTER — Other Ambulatory Visit: Payer: Self-pay | Admitting: Surgery

## 2018-02-14 DIAGNOSIS — R1909 Other intra-abdominal and pelvic swelling, mass and lump: Secondary | ICD-10-CM

## 2018-03-01 ENCOUNTER — Telehealth: Payer: Self-pay | Admitting: *Deleted

## 2018-03-01 NOTE — Telephone Encounter (Signed)
Received results from [facility]; forwarded to provider/SLS 01/14

## 2018-03-04 ENCOUNTER — Other Ambulatory Visit: Payer: Self-pay | Admitting: Surgery

## 2018-03-15 ENCOUNTER — Telehealth: Payer: Self-pay | Admitting: Family Medicine

## 2018-03-15 ENCOUNTER — Other Ambulatory Visit: Payer: Self-pay | Admitting: Family Medicine

## 2018-03-15 NOTE — Telephone Encounter (Signed)
Copied from [place] [id number]. Topic: Quick Communication - Rx Refill/Question >> Mar 15, 2018 10:07 AM Antonieta Iba C wrote: Medication: Gabapentin 300 MG Directions: Take 3 times daily. Pt says that a general surgeon that Dr. B sent her to prescribed medication. Pt says that she was having some general body pain. Pt says that she doesn't see the medication in her chart but she does have the pill bottle. Pt says that she is unable to find the provider that prescribed to request a refill so she is hoping that PCP is able to assist her.   Has the patient contacted their pharmacy? No  (Agent: If no, request that the patient contact the pharmacy for the refill.) (Agent: If yes, when and what did the pharmacy advise?)  Preferred Pharmacy (with phone number or street name): [facility], [address] [phone] (Phone) [phone] (Fax)    Agent: Please be advised that RX refills may take up to 3 business days. We ask that you follow-up with your pharmacy.

## 2018-03-16 MED ORDER — VENLAFAXINE HCL ER 75 MG PO CP24: 75.0000 mg | ORAL_CAPSULE | Freq: Every day | ORAL | 1 refills | Status: DC

## 2018-03-16 NOTE — Telephone Encounter (Signed)
Please advise 

## 2018-03-16 NOTE — Telephone Encounter (Signed)
If she brings in the bottle so I can document when she started it and who prescribed it I can probably help. I also need her to confirm that she is getting some relief and has no concerning side effects

## 2018-03-17 ENCOUNTER — Encounter: Payer: Self-pay | Admitting: Family Medicine

## 2018-03-17 ENCOUNTER — Ambulatory Visit (INDEPENDENT_AMBULATORY_CARE_PROVIDER_SITE_OTHER): Payer: Managed Care, Other (non HMO) | Admitting: Family Medicine

## 2018-03-17 ENCOUNTER — Ambulatory Visit: Payer: Self-pay | Admitting: *Deleted

## 2018-03-17 VITALS — BP 110/82 | HR 80 | Temp 98.7°F | Ht 68.0 in | Wt 162.0 lb

## 2018-03-17 DIAGNOSIS — R6889 Other general symptoms and signs: Secondary | ICD-10-CM | POA: Diagnosis not present

## 2018-03-17 MED ORDER — BENZONATATE 100 MG PO CAPS: 100.0000 mg | ORAL_CAPSULE | Freq: Three times a day (TID) | ORAL | 0 refills | Status: DC | PRN

## 2018-03-17 MED ORDER — ONDANSETRON HCL 4 MG PO TABS: 4.0000 mg | ORAL_TABLET | Freq: Three times a day (TID) | ORAL | 0 refills | Status: DC | PRN

## 2018-03-17 NOTE — Progress Notes (Signed)
Pre visit review using our clinic review tool, if applicable. No additional management support is needed unless otherwise documented below in the visit note. 

## 2018-03-17 NOTE — Telephone Encounter (Signed)
Pt calling with complaints of vomiting since yesterday and diarrhea. Pt states she has vomited once today and is only able to keep water and Pedialyte down at this time. Pt also states she had 3 episodes of diarrhea today. Pt also has complaints of having a deep cough for the past four days, right sided chest pressure, fever, runny nose and some wheezing at times.No appt availability with PCP on today. Pt scheduled for appt with Dr. Nani Ravens today at 2:15 and advised to return call to office with worsening symptoms. Pt verbalized understanding . Reason for Disposition . [1] MILD or MODERATE vomiting AND [2] present > 48 hours (2 days) (Exception: mild vomiting with associated diarrhea)  Answer Assessment - Initial Assessment Questions 1. VOMITING SEVERITY: "How many times have you vomited in the past 24 hours?"     - MILD:  1 - 2 times/day    - MODERATE: 3 - 5 times/day, decreased oral intake without significant weight loss or symptoms of dehydration    - SEVERE: 6 or more times/day, vomits everything or nearly everything, with significant weight loss, symptoms of dehydration      Once today 2. ONSET: "When did the vomiting begin?"      On yesterday 3. FLUIDS: "What fluids or food have you vomited up today?" "Have you been able to keep any fluids down?"    Able to keep water and pedialyte down 4. ABDOMINAL PAIN: "Are your having any abdominal pain?" If yes : "How bad is it and what does it feel like?" (e.g., crampy, dull, intermittent, constant)      Not really feels crampy 5. DIARRHEA: "Is there any diarrhea?" If so, ask: "How many times today?"      Watery, 3 times today 6. CONTACTS: "Is there anyone else in the family with the same symptoms?"      No 7. CAUSE: "What do you think is causing your vomiting?"     Unknown  8. OTHER SYMPTOMS: "Do you have any other symptoms?" (e.g., fever, headache, vertigo, vomiting blood or coffee grounds, recent head injury)    Cough, fever, vomiting, runny  nose  9. PREGNANCY: "Is there any chance you are pregnant?" "When was your last menstrual period?"       No preganancy, Dec 20 LMP, pt has period every 3 months  Protocols used: [facility]

## 2018-03-17 NOTE — Patient Instructions (Signed)
Continue to push fluids, practice good hand hygiene, and cover your mouth if you cough.  If you start having increasing fevers, shaking or shortness of breath, seek immediate care.  OK to take Tylenol 1000 mg (2 extra strength tabs) or 975 mg (3 regular strength tabs) every 6 hours as needed.  Ibuprofen 400-600 mg (2-3 over the counter strength tabs) every 6 hours as needed for pain.  Let us know if you need anything.

## 2018-03-17 NOTE — Progress Notes (Signed)
Chief Complaint  Patient presents with  . Nausea  . Cough    Kathryn [name] here for URI complaints.  Duration: 4 days  Associated symptoms: fever (101 F), sinus congestion, rhinorrhea, itchy watery eyes, ear pain, ear drainage, wheezing, shortness of breath, cough, abd pain, myalgias, diarrhea, and N/V Denies: sinus pain, ear fullness, sore throat Treatment to date: Ibuprofen, Pedialyte, Mucinex Sick contacts: No  ROS:  Const: +fevers HEENT: As noted in HPI Lungs: +cough  Past Medical History:  Diagnosis Date  . Abnormal liver function tests 02/02/2016  . ADHD 06/10/2016  . Alcohol use 02/23/2016  . Allergy   . Anxiety   . Arm fracture, right    as child, fx in three places, no surgery  . Depression   . Depression with anxiety 05-07-2014   PTSD s/p death of best friend   . Essential hypertension 02/02/2016  . GERD (gastroesophageal reflux disease)   . 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 Headache 04/09/2016  . Hyperlipidemia, mixed 02/02/2016  . Migraine   . Ovarian cyst   . PCOS (polycystic ovarian syndrome)   . Preventative health care 10/27/2015  . Thrombocytosis ([place]) 04/09/2016    BP 110/82 (BP Location: Left Arm, Patient Position: Sitting, Cuff Size: Normal)   Pulse 80   Temp 98.7 F (37.1 C) (Oral)   Ht 5\' 8"  (1.727 m)   Wt 162 lb (73.5 kg)   SpO2 97%   BMI 24.63 kg/m  General: Awake, alert, appears stated age HEENT: AT, [place], ears patent b/l and TM's neg, nares patent w/o discharge, pharynx pink and without exudates, MMM Neck: No masses or asymmetry Heart: RRR Lungs: CTAB, no accessory muscle use Abd: BS+, soft, mild ttp diffusely, no masses or organomegaly Psych: Age appropriate judgment and insight, normal mood and affect  Flu-like symptoms - Plan: ondansetron (ZOFRAN) 4 MG tablet, benzonatate (TESSALON) 100 MG capsule  Sounds like flu given combination of s/s's. Will tx supportively. Out of window for anti-viral.  Continue to push fluids, practice good hand  hygiene, cover mouth when coughing. F/u prn. If starting to experience increasing fevers, shaking, or shortness of breath, seek immediate care. Pt voiced understanding and agreement to the plan.  [name], DO 03/17/18 2:38 PM

## 2018-03-21 NOTE — Telephone Encounter (Signed)
Called left message for patient to call the office back 

## 2018-03-24 ENCOUNTER — Other Ambulatory Visit: Payer: Self-pay | Admitting: Family Medicine

## 2018-03-24 DIAGNOSIS — F418 Other specified anxiety disorders: Secondary | ICD-10-CM

## 2018-03-25 MED ORDER — BUSPIRONE HCL 10 MG PO TABS: 10.0000 mg | ORAL_TABLET | Freq: Three times a day (TID) | ORAL | 1 refills | Status: DC

## 2018-03-28 ENCOUNTER — Encounter: Payer: Self-pay | Admitting: Family Medicine

## 2018-03-30 ENCOUNTER — Other Ambulatory Visit: Payer: Self-pay | Admitting: Family Medicine

## 2018-03-30 MED ORDER — GABAPENTIN 300 MG PO CAPS: 300.0000 mg | ORAL_CAPSULE | Freq: Three times a day (TID) | ORAL | 3 refills | Status: DC

## 2018-04-13 ENCOUNTER — Other Ambulatory Visit (HOSPITAL_COMMUNITY): Payer: Self-pay

## 2018-04-13 MED ORDER — LAMOTRIGINE 25 MG PO TABS: 25.0000 mg | ORAL_TABLET | Freq: Every day | ORAL | 0 refills | Status: DC

## 2018-04-18 ENCOUNTER — Other Ambulatory Visit: Payer: Self-pay | Admitting: Family Medicine

## 2018-04-18 DIAGNOSIS — F988 Other specified behavioral and emotional disorders with onset usually occurring in childhood and adolescence: Secondary | ICD-10-CM

## 2018-04-18 MED ORDER — AMPHETAMINE-DEXTROAMPHETAMINE 10 MG PO TABS: 10.0000 mg | ORAL_TABLET | Freq: Two times a day (BID) | ORAL | 0 refills | Status: DC

## 2018-04-18 NOTE — Telephone Encounter (Signed)
Requesting: Adderall Contract: N/A UDS: N/A Last OV: 03/17/2018 Next OV: 05/06/2018 Last Refill: 01/06/2018 Database:   Please advise

## 2018-04-27 ENCOUNTER — Encounter (HOSPITAL_COMMUNITY): Payer: Self-pay | Admitting: Psychiatry

## 2018-04-27 ENCOUNTER — Other Ambulatory Visit: Payer: Self-pay

## 2018-04-27 ENCOUNTER — Ambulatory Visit (INDEPENDENT_AMBULATORY_CARE_PROVIDER_SITE_OTHER): Payer: 59 | Admitting: Psychiatry

## 2018-04-27 VITALS — BP 118/78 | HR 97 | Ht 68.0 in | Wt 159.0 lb

## 2018-04-27 DIAGNOSIS — Z79899 Other long term (current) drug therapy: Secondary | ICD-10-CM

## 2018-04-27 DIAGNOSIS — F3162 Bipolar disorder, current episode mixed, moderate: Secondary | ICD-10-CM

## 2018-04-27 DIAGNOSIS — F1290 Cannabis use, unspecified, uncomplicated: Secondary | ICD-10-CM

## 2018-04-27 DIAGNOSIS — F411 Generalized anxiety disorder: Secondary | ICD-10-CM

## 2018-04-27 DIAGNOSIS — F901 Attention-deficit hyperactivity disorder, predominantly hyperactive type: Secondary | ICD-10-CM | POA: Diagnosis not present

## 2018-04-27 NOTE — Progress Notes (Signed)
[facility] Outpatient Follow up visit   Patient Identification: Suraya Vidrine MRN:  [id number] Date of Evaluation:  04/27/2018 Referral Source: Primary care and counsellor Chief Complaint:   Chief Complaint    Follow-up; Other     Visit Diagnosis:    ICD-10-CM   1. Bipolar 1 disorder, mixed, moderate (HCC) F31.62   2. Attention deficit hyperactivity disorder (ADHD), predominantly hyperactive type F90.1   3. GAD (generalized anxiety disorder) F41.1     History of Present Illness: 23 years old currently single Caucasian female, lives with roommate,  referred by counselor in [place] for management of mood swings depression anxiety.  Doing fair no use of Xanax for more than 1 year.  Sporadic use of marijuana still.  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 Abuse history  Less flashbacks.   Past Psychiatric History: depression, anxiety , alcohol use  Previous Psychotropic Medications: Yes   Substance Abuse History in the last 12 months:  Yes.    Consequences of Substance Abuse: Medical Consequences:  depression, mood swings.   Past Medical History:  Past Medical History:  Diagnosis Date  . Abnormal liver function tests 02/02/2016  . ADHD 06/10/2016  . Alcohol use 02/23/2016  . Allergy   . Anxiety   . Arm fracture, right    as child, fx in three places, no surgery  . Depression   . Depression with anxiety 2014-05-09   PTSD s/p death of best friend   . Essential hypertension 02/02/2016  . GERD (gastroesophageal reflux disease)   . 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  Headache 04/09/2016  . Hyperlipidemia, mixed 02/02/2016  . Migraine   . Ovarian cyst   . PCOS (polycystic ovarian syndrome)   . Preventative health care 10/27/2015  . Thrombocytosis ([place]) 04/09/2016    Past Surgical History:  Procedure Laterality Date  . ADENOIDECTOMY  2002  . TYMPANOSTOMY TUBE PLACEMENT Bilateral 1999    Family Psychiatric History: mom side has depression , anxiety  Family History:  Family History  Problem Relation Age of Onset  . Hypertension Mother   . Heart disease Mother        CAD, stent at 22, s/p cardiac arrest with Defib  . Hyperlipidemia Mother   . Diabetes Mother        s/p gastric bypass  . Obesity Mother        s/p gastric bypass  . Hyperlipidemia Father   . Hypertension Father   . Asthma Sister   . Hyperlipidemia Brother   . Stroke Maternal Grandmother   . Thyroid disease Maternal Grandmother   . Heart disease Maternal Grandfather        MI first at 30, s/p stents and CABG  . Hyperlipidemia Maternal Grandfather   . Hypertension Maternal Grandfather   . Stroke Maternal Grandfather   . Cancer Maternal Grandfather        melanoma  . Aneurysm Maternal Grandfather        brain  . Kidney disease Maternal Grandfather        tumor  . Heart disease  Paternal Grandmother   . Cancer Paternal Lucilla Edin and brain  . Heart disease Maternal Uncle   . Vision loss Maternal Uncle     Social History:   Social History   Socioeconomic History  . Marital status: Single    Spouse name: Not on file  . Number of children: 0  . Years of education: 61  . Highest education level: Not on file  Occupational History    Comment: [employer]  Social Needs  . Financial resource strain: Not on file  . Food insecurity:    Worry: Not on file    Inability: Not on file  . Transportation needs:    Medical: Not on file    Non-medical: Not on file  Tobacco Use  . Smoking status: Heavy Tobacco Smoker    Packs/day: 1.50    Types: Cigarettes  .  Smokeless tobacco: Current User    Types: Snuff  Substance and Sexual Activity  . Alcohol use: No    Alcohol/week: 0.0 standard drinks    Comment: maybe every 6 months  . Drug use: Yes    Types: Marijuana    Comment: 08/04/17 "weed daily"  . Sexual activity: Yes    Birth control/protection: Pill    Comment: lives with partner, works for [employer], no diewtary restrictions.   Lifestyle  . Physical activity:    Days per week: Not on file    Minutes per session: Not on file  . Stress: Not on file  Relationships  . Social connections:    Talks on phone: Not on file    Gets together: Not on file    Attends religious service: Not on file    Active member of club or organization: Not on file    Attends meetings of clubs or organizations: Not on file    Relationship status: Not on file  Other Topics Concern  . Not on file  Social History Narrative   Lives with girlfriend's family   Caffeine- soda and tea, 6 cans daily      Allergies:   Allergies  Allergen Reactions  . Oxycodone Nausea Only    Oxycontin also  . Seroquel [Quetiapine Fumarate]     Mental status changes    Metabolic Disorder Labs: Lab Results  Component Value Date   HGBA1C 5.5 08/04/2017   No results found for: PROLACTIN Lab Results  Component Value Date   CHOL 218 (H) 05/05/2017   TRIG 107.0 05/05/2017   HDL 42.00 05/05/2017   CHOLHDL 5 05/05/2017   VLDL 21.4 05/05/2017   LDLCALC 155 (H) 05/05/2017     Current Medications: Current Outpatient Medications  Medication Sig Dispense Refill  . albuterol (PROVENTIL HFA;VENTOLIN HFA) 108 (90 Base) MCG/ACT inhaler Inhale 2 puffs into the lungs every 6 (six) hours as needed for wheezing or shortness of breath. 3 Inhaler 0  . amphetamine-dextroamphetamine (ADDERALL) 10 MG tablet Take 1 tablet (10 mg total) by mouth 2 (two) times daily with a meal. January 2020 60 tablet 0  . amphetamine-dextroamphetamine (ADDERALL) 10 MG tablet Take 1 tablet (10 mg  total) by mouth 2 (two) times daily with a meal. December 2019 60 tablet 0  . amphetamine-dextroamphetamine (ADDERALL) 10 MG tablet Take 1 tablet (10 mg total) by mouth 2 (two) times daily with a meal. March 2020 60 tablet 0  . busPIRone (BUSPAR) 10 MG tablet Take 1 tablet (10 mg total) by mouth 3 (three) times daily. [place]  tablet 1  . Dexlansoprazole 30 MG capsule Take 30 mg by mouth daily.    Marland Kitchen gabapentin (NEURONTIN) 300 MG capsule Take 1 capsule (300 mg total) by mouth 3 (three) times daily. 90 capsule 3  . lamoTRIgine (LAMICTAL) 25 MG tablet Take 1 tablet (25 mg total) by mouth daily. Take 2 a day. 180 tablet 0  . Levonorgestrel-Ethinyl Estradiol (AMETHIA,CAMRESE) 0.15-0.03 &0.01 MG tablet TAKE 1 TABLET BY MOUTH EVERY DAY 91 tablet 1  . mometasone-formoterol (DULERA) 100-5 MCG/ACT AERO Inhale 2 puffs into the lungs 2 (two) times daily. 3 Inhaler 1  . montelukast (SINGULAIR) 10 MG tablet Take 1 tablet (10 mg total) by mouth at bedtime. 90 tablet 1  . valACYclovir (VALTREX) 1000 MG tablet Take 1 tablet (1,000 mg total) by mouth 2 (two) times daily. 60 tablet 5  . venlafaxine XR (EFFEXOR-XR) 75 MG 24 hr capsule Take 1 capsule (75 mg total) by mouth daily with breakfast. 90 capsule 1  . benzonatate (TESSALON) 100 MG capsule Take 1 capsule (100 mg total) by mouth 3 (three) times daily as needed. (Patient not taking: Reported on 04/27/2018) 30 capsule 0  . fluconazole (DIFLUCAN) 150 MG tablet Take 1 tablet (150 mg total) by mouth once a week. (Patient not taking: Reported on 04/27/2018) 2 tablet 1  . ondansetron (ZOFRAN) 4 MG tablet Take 1 tablet (4 mg total) by mouth every 8 (eight) hours as needed for nausea or vomiting. (Patient not taking: Reported on 04/27/2018) 20 tablet 0   No current facility-administered medications for this visit.      Psychiatric Specialty Exam: Review of Systems  HENT: Negative for hearing loss.   Cardiovascular: Negative for palpitations.  Skin: Negative for rash.   Neurological: Negative for tremors.  Psychiatric/Behavioral: Positive for substance abuse. Negative for suicidal ideas.    Blood pressure 118/78, pulse 97, height 5\' 8"  (1.727 m), weight 159 lb (72.1 kg).Body mass index is 24.18 kg/m.  General Appearance: Casual  Eye Contact:  Fair  Speech:  Normal Rate  Volume:  Normal  Mood: fair  Affect: better  Thought Process:  Goal Directed  Orientation:  Full (Time, Place, and Person)  Thought Content:  Rumination  Suicidal Thoughts:  No  Homicidal Thoughts:  No  Memory:  Immediate;   Fair Recent;   Fair  Judgement:  Poor  Insight:  Shallow  Psychomotor Activity:  Normal  Concentration:  Concentration: Fair and Attention Span: Fair  Recall:  [employer] of Knowledge:Good  Language: Good  Akathisia:  No  Handed:  Right  AIMS (if indicated):    Assets:  Desire for Improvement  ADL's:  Intact  Cognition: WNL  Sleep:  variable    Treatment Plan Summary: Medication management and Plan as follows  1. MOOd disorder, possible bipolar disorder, mixed or depressed type Doing fair, continue lamictal.   2. GAD better. Continue effexor, buspar. Avoid marijuana 3. THC use: have cut down. Encouraged abstinence 4. Alcohol use : remains sober. Discussed relapse prevention and to avoid alcohol and any benzodiazepines. 5.ADHD: manageable on 10mg  once or bid. Gets from primary care. Recommend to keep dose low Walk and water instead of coffee exces  Call for lamictal refill Fu 4-43m   Merian Capron, MD 3/11/20209:57 AM

## 2018-05-06 ENCOUNTER — Other Ambulatory Visit: Payer: Self-pay

## 2018-05-06 ENCOUNTER — Ambulatory Visit: Payer: Self-pay | Admitting: Family Medicine

## 2018-05-11 ENCOUNTER — Other Ambulatory Visit: Payer: Self-pay | Admitting: Family Medicine

## 2018-05-11 ENCOUNTER — Telehealth: Payer: Self-pay | Admitting: Family Medicine

## 2018-05-11 DIAGNOSIS — J029 Acute pharyngitis, unspecified: Secondary | ICD-10-CM

## 2018-05-11 DIAGNOSIS — F988 Other specified behavioral and emotional disorders with onset usually occurring in childhood and adolescence: Secondary | ICD-10-CM

## 2018-05-11 MED ORDER — AMPHETAMINE-DEXTROAMPHETAMINE 10 MG PO TABS: 10.0000 mg | ORAL_TABLET | Freq: Two times a day (BID) | ORAL | 0 refills | Status: DC

## 2018-05-11 NOTE — Telephone Encounter (Signed)
Copied from [place] [id number]. Topic: Quick Communication - Rx Refill/Question >> May 11, 2018  1:07 PM Richardo Priest, NT wrote: Medication: amphetamine-dextroamphetamine (ADDERALL) 10 MG tablet montelukast (SINGULAIR) 10 MG tablet omeprazole (PRILOSEC) 40 MG capsule [place] -contraception   Has the patient contacted their pharmacy? Yes, patient asking for MD to refill medications before she runs out.   Preferred Pharmacy (with phone number or street name):  [facility], [address] [phone] (Phone) [phone] (Fax)  Agent: Please be advised that RX refills may take up to 3 business days. We ask that you follow-up with your pharmacy.

## 2018-05-11 NOTE — Telephone Encounter (Signed)
I refilled the Adderall. Please refill the other meds requested

## 2018-05-12 MED ORDER — FAMOTIDINE 20 MG PO TABS: 20.0000 mg | ORAL_TABLET | Freq: Two times a day (BID) | ORAL | 0 refills | Status: DC

## 2018-05-12 MED ORDER — MONTELUKAST SODIUM 10 MG PO TABS: 10.0000 mg | ORAL_TABLET | Freq: Every day | ORAL | 1 refills | Status: DC

## 2018-05-12 NOTE — Telephone Encounter (Signed)
Medications sent in.

## 2018-05-18 ENCOUNTER — Telehealth: Payer: Self-pay | Admitting: Family Medicine

## 2018-05-18 MED ORDER — OMEPRAZOLE 40 MG PO CPDR: 40.0000 mg | DELAYED_RELEASE_CAPSULE | Freq: Every day | ORAL | 3 refills | Status: DC

## 2018-05-18 NOTE — Telephone Encounter (Signed)
No it is written for 05/2018 the year not the 20th of April 2020

## 2018-05-18 NOTE — Telephone Encounter (Signed)
Medications sent in.

## 2018-05-18 NOTE — Telephone Encounter (Signed)
Please advise. Rx's written for 06/06/2018 she is needing today- she is out.

## 2018-05-18 NOTE — Telephone Encounter (Signed)
Copied from [place] [id number]. Topic: Quick Communication - Rx Refill/Question >> May 18, 2018  1:04 PM Nils Flack, Marland Kitchen wrote: Medication: amphetamine-dextroamphetamine (ADDERALL) 10 MG tablet  Has the patient contacted their pharmacy? Yes.   (Agent: If no, request that the patient contact the pharmacy for the refill.) (Agent: If yes, when and what did the pharmacy advise?)  Preferred Pharmacy (with phone number or street name): [facility] Pt said that provider wrote it for  06/06/18 but pt is out now   Agent: Please be advised that RX refills may take up to 3 business days. We ask that you follow-up with your pharmacy.

## 2018-05-18 NOTE — Telephone Encounter (Signed)
Pt left vm stating pepcid called into her pharmacy but this does not work for her. Pt is request omeprazole

## 2018-05-19 NOTE — Telephone Encounter (Signed)
Spoke with pharmacy she stated the tech looked at the Rx wrong and they filled medication for patient

## 2018-06-09 ENCOUNTER — Encounter (HOSPITAL_COMMUNITY): Payer: Self-pay | Admitting: Psychiatry

## 2018-06-09 ENCOUNTER — Other Ambulatory Visit: Payer: Self-pay

## 2018-06-09 ENCOUNTER — Ambulatory Visit (INDEPENDENT_AMBULATORY_CARE_PROVIDER_SITE_OTHER): Payer: 59 | Admitting: Psychiatry

## 2018-06-09 DIAGNOSIS — F1220 Cannabis dependence, uncomplicated: Secondary | ICD-10-CM | POA: Diagnosis not present

## 2018-06-09 DIAGNOSIS — F3162 Bipolar disorder, current episode mixed, moderate: Secondary | ICD-10-CM

## 2018-06-09 DIAGNOSIS — F901 Attention-deficit hyperactivity disorder, predominantly hyperactive type: Secondary | ICD-10-CM

## 2018-06-09 DIAGNOSIS — F411 Generalized anxiety disorder: Secondary | ICD-10-CM

## 2018-06-09 MED ORDER — LAMOTRIGINE 100 MG PO TABS: 100.0000 mg | ORAL_TABLET | Freq: Every day | ORAL | 1 refills | Status: DC

## 2018-06-09 NOTE — Progress Notes (Signed)
[facility] Outpatient Follow up visit  Tele psych visit Patient Identification: Kathryn [name] MRN:  [id number] Date of Evaluation:  06/09/2018 Referral Source: Primary care and counsellor Chief Complaint:    Visit Diagnosis:    ICD-10-CM   1. Bipolar 1 disorder, mixed, moderate (HCC) F31.62   2. Attention deficit hyperactivity disorder (ADHD), predominantly hyperactive type F90.1   3. GAD (generalized anxiety disorder) F41.1   4. Moderate tetrahydrocannabinol (THC) dependence (HCC) F12.20     History of Present Illness: 23 years old currently single Caucasian female, lives with roommate,  referred by counselor in [place] for management of mood swings depression anxiety.  I connected with [name] on 06/09/18 at 11:00 AM EDT by telephone and verified that I am speaking with the correct person using two identifiers.   I discussed the limitations, risks, security and privacy concerns of performing an evaluation and management service by telephone and the availability of in person appointments. I also discussed with the patient that there may be a patient responsible charge related to this service. The patient expressed understanding and agreed to proceed.  Works at [employer], feels edgy or subdued during the evening . On adderall for adhd Says not using marijuana buspar helps some anxiety No rash on lamictal. Dose is 50mg  Also on gaba for pain  Modifying factor: mom, friends   Aggravating factorl; past use of drugs. Abuse history  Less flashbacks.   Past Psychiatric History: depression, anxiety , alcohol use  Previous Psychotropic Medications: Yes   Substance Abuse History in the last 12 months:  Yes.    Consequences of Substance Abuse: Medical Consequences:  depression, mood swings.   Past Medical History:  Past Medical History:  Diagnosis Date  . Abnormal liver function tests 02/02/2016  . ADHD 06/10/2016  . Alcohol use 02/23/2016  . Allergy   . Anxiety   . Arm  fracture, right    as child, fx in three places, no surgery  . Depression   . Depression with anxiety 2014/04/30   PTSD s/p death of best friend   . Essential hypertension 02/02/2016  . GERD (gastroesophageal reflux disease)   . 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 Headache 04/09/2016  . Hyperlipidemia, mixed 02/02/2016  . Migraine   . Ovarian cyst   . PCOS (polycystic ovarian syndrome)   . Preventative health care 10/27/2015  . Thrombocytosis ([place]) 04/09/2016    Past Surgical History:  Procedure Laterality Date  . ADENOIDECTOMY  2002  . TYMPANOSTOMY TUBE PLACEMENT Bilateral 1999    Family Psychiatric History: mom side has depression , anxiety  Family History:  Family History  Problem Relation Age of Onset  . Hypertension Mother   . Heart disease Mother        CAD, stent at 18, s/p cardiac arrest with Defib  . Hyperlipidemia Mother   . Diabetes Mother        s/p gastric bypass  . Obesity Mother        s/p gastric bypass  . Hyperlipidemia Father   . Hypertension Father   . Asthma Sister   . Hyperlipidemia Brother   . Stroke Maternal Grandmother   . Thyroid disease Maternal Grandmother   . Heart disease Maternal Grandfather        MI first at 46, s/p stents and CABG  . Hyperlipidemia Maternal Grandfather   . Hypertension Maternal Grandfather   . Stroke Maternal Grandfather   . Cancer Maternal Grandfather        melanoma  .  Aneurysm Maternal Grandfather        brain  . Kidney disease Maternal Grandfather        tumor  . Heart disease Paternal Grandmother   . Cancer Paternal Lucilla Edin and brain  . Heart disease Maternal Uncle   . Vision loss Maternal Uncle     Social History:   Social History   Socioeconomic History  . Marital status: Single    Spouse name: Not on file  . Number of children: 0  . Years of education: 13  . Highest education level: Not on file  Occupational History    Comment: [employer]  Social Needs  . Financial resource strain:  Not on file  . Food insecurity:    Worry: Not on file    Inability: Not on file  . Transportation needs:    Medical: Not on file    Non-medical: Not on file  Tobacco Use  . Smoking status: Heavy Tobacco Smoker    Packs/day: 1.50    Types: Cigarettes  . Smokeless tobacco: Current User    Types: Snuff  Substance and Sexual Activity  . Alcohol use: No    Alcohol/week: 0.0 standard drinks    Comment: maybe every 6 months  . Drug use: Yes    Types: Marijuana    Comment: 08/04/17 "weed daily"  . Sexual activity: Yes    Birth control/protection: Pill    Comment: lives with partner, works for [employer], no diewtary restrictions.   Lifestyle  . Physical activity:    Days per week: Not on file    Minutes per session: Not on file  . Stress: Not on file  Relationships  . Social connections:    Talks on phone: Not on file    Gets together: Not on file    Attends religious service: Not on file    Active member of club or organization: Not on file    Attends meetings of clubs or organizations: Not on file    Relationship status: Not on file  Other Topics Concern  . Not on file  Social History Narrative   Lives with girlfriend's family   Caffeine- soda and tea, 6 cans daily      Allergies:   Allergies  Allergen Reactions  . Oxycodone Nausea Only    Oxycontin also  . Seroquel [Quetiapine Fumarate]     Mental status changes    Metabolic Disorder Labs: Lab Results  Component Value Date   HGBA1C 5.5 08/04/2017   No results found for: PROLACTIN Lab Results  Component Value Date   CHOL 218 (H) 05/05/2017   TRIG 107.0 05/05/2017   HDL 42.00 05/05/2017   CHOLHDL 5 05/05/2017   VLDL 21.4 05/05/2017   LDLCALC 155 (H) 05/05/2017     Current Medications: Current Outpatient Medications  Medication Sig Dispense Refill  . albuterol (PROVENTIL HFA;VENTOLIN HFA) 108 (90 Base) MCG/ACT inhaler Inhale 2 puffs into the lungs every 6 (six) hours as needed for wheezing or  shortness of breath. 3 Inhaler 0  . amphetamine-dextroamphetamine (ADDERALL) 10 MG tablet Take 1 tablet (10 mg total) by mouth 2 (two) times daily with a meal. March 2020 60 tablet 0  . amphetamine-dextroamphetamine (ADDERALL) 10 MG tablet Take 1 tablet (10 mg total) by mouth 2 (two) times daily with a meal. May 2020 60 tablet 0  . amphetamine-dextroamphetamine (ADDERALL) 10 MG tablet Take 1 tablet (10 mg total) by mouth 2 (two) times  daily with a meal. April 2020 60 tablet 0  . benzonatate (TESSALON) 100 MG capsule Take 1 capsule (100 mg total) by mouth 3 (three) times daily as needed. (Patient not taking: Reported on 04/27/2018) 30 capsule 0  . busPIRone (BUSPAR) 10 MG tablet Take 1 tablet (10 mg total) by mouth 3 (three) times daily. 90 tablet 1  . Dexlansoprazole 30 MG capsule Take 30 mg by mouth daily.    . famotidine (PEPCID) 20 MG tablet Take 1 tablet (20 mg total) by mouth 2 (two) times daily. 180 tablet 0  . fluconazole (DIFLUCAN) 150 MG tablet Take 1 tablet (150 mg total) by mouth once a week. (Patient not taking: Reported on 04/27/2018) 2 tablet 1  . gabapentin (NEURONTIN) 300 MG capsule Take 1 capsule (300 mg total) by mouth 3 (three) times daily. 90 capsule 3  . lamoTRIgine (LAMICTAL) 100 MG tablet Take 1 tablet (100 mg total) by mouth daily. Take one a day 30 tablet 1  . Levonorgestrel-Ethinyl Estradiol (AMETHIA,CAMRESE) 0.15-0.03 &0.01 MG tablet TAKE 1 TABLET BY MOUTH EVERY DAY 91 tablet 1  . mometasone-formoterol (DULERA) 100-5 MCG/ACT AERO Inhale 2 puffs into the lungs 2 (two) times daily. 3 Inhaler 1  . montelukast (SINGULAIR) 10 MG tablet Take 1 tablet (10 mg total) by mouth at bedtime. 90 tablet 1  . omeprazole (PRILOSEC) 40 MG capsule Take 1 capsule (40 mg total) by mouth daily. 30 capsule 3  . ondansetron (ZOFRAN) 4 MG tablet Take 1 tablet (4 mg total) by mouth every 8 (eight) hours as needed for nausea or vomiting. (Patient not taking: Reported on 04/27/2018) 20 tablet 0  .  valACYclovir (VALTREX) 1000 MG tablet Take 1 tablet (1,000 mg total) by mouth 2 (two) times daily. 60 tablet 5  . venlafaxine XR (EFFEXOR-XR) 75 MG 24 hr capsule Take 1 capsule (75 mg total) by mouth daily with breakfast. 90 capsule 1   No current facility-administered medications for this visit.      Psychiatric Specialty Exam: Review of Systems  HENT: Negative for hearing loss.   Cardiovascular: Negative for palpitations.  Skin: Negative for itching.  Neurological: Negative for tremors.  Psychiatric/Behavioral: Positive for substance abuse. Negative for suicidal ideas.    There were no vitals taken for this visit.There is no height or weight on file to calculate BMI.  General Appearance:  Eye Contact:    Speech:  Normal Rate  Volume:  Normal  Mood:somewhat subdued  Affect:   Thought Process:  Goal Directed  Orientation:  Full (Time, Place, and Person)  Thought Content:  Rumination  Suicidal Thoughts:  No  Homicidal Thoughts:  No  Memory:  Immediate;   Fair Recent;   Fair  Judgement:  Poor  Insight:  Shallow  Psychomotor Activity:  Normal  Concentration:  Concentration: Fair and Attention Span: Fair  Recall:  [employer] of Knowledge:Good  Language: Good  Akathisia:  No  Handed:  Right  AIMS (if indicated):    Assets:  Desire for Improvement  ADL's:  Intact  Cognition: WNL  Sleep:  variable    Treatment Plan Summary: Medication management and Plan as follows  1. MOOd disorder, possible bipolar disorder, mixed or depressed type Somewhat subdued, feels edgy at evening. Increase lamictal to 75 . Then to 100mg  in one week, has 25mg  tabets to suse for now. No rash  2. GAD; fluctuates, continue buspar, avoid marijua  3. THC use: have cut down. Encouraged abstinence 4. Alcohol use : remains sober.  Discussed relapse prevention and to avoid alcohol and any benzodiazepines. 5.ADHD: manageable on 10mg  once or bid. Gets from primary care. Recommend to keep dose low as it can  cause stimulation or irritability  Walk and water instead of coffee exces I discussed the assessment and treatment plan with the patient. The patient was provided an opportunity to ask questions and all were answered. The patient agreed with the plan and demonstrated an understanding of the instructions.   The patient was advised to call back or seek an in-person evaluation if the symptoms worsen or if the condition fails to improve as anticipated.  I provided 15 minutes of non-face-to-face time during this encounter. Fu 2m.    Merian Capron, MD 4/23/202010:55 AM

## 2018-06-24 ENCOUNTER — Other Ambulatory Visit: Payer: Self-pay

## 2018-06-24 ENCOUNTER — Ambulatory Visit (INDEPENDENT_AMBULATORY_CARE_PROVIDER_SITE_OTHER): Payer: Managed Care, Other (non HMO) | Admitting: Family Medicine

## 2018-06-24 DIAGNOSIS — F319 Bipolar disorder, unspecified: Secondary | ICD-10-CM

## 2018-06-24 DIAGNOSIS — F988 Other specified behavioral and emotional disorders with onset usually occurring in childhood and adolescence: Secondary | ICD-10-CM

## 2018-06-24 DIAGNOSIS — G4700 Insomnia, unspecified: Secondary | ICD-10-CM

## 2018-06-24 MED ORDER — OMEPRAZOLE 40 MG PO CPDR: 40.0000 mg | DELAYED_RELEASE_CAPSULE | Freq: Two times a day (BID) | ORAL | 3 refills | Status: AC

## 2018-06-24 MED ORDER — GABAPENTIN 300 MG PO CAPS: 300.0000 mg | ORAL_CAPSULE | Freq: Three times a day (TID) | ORAL | 3 refills | Status: DC

## 2018-06-24 MED ORDER — AMPHETAMINE-DEXTROAMPHETAMINE 10 MG PO TABS: 10.0000 mg | ORAL_TABLET | Freq: Two times a day (BID) | ORAL | 0 refills | Status: DC

## 2018-06-24 MED ORDER — HYDROXYZINE HCL 10 MG PO TABS: 10.0000 mg | ORAL_TABLET | Freq: Every evening | ORAL | 1 refills | Status: DC | PRN

## 2018-06-26 NOTE — Progress Notes (Signed)
Virtual Visit via Video Note  I connected with Kathryn [name] on 06/26/18 at  9:20 AM EDT by a video enabled telemedicine application and verified that I am speaking with the correct person using two identifiers.  Location: Patient: work Provider: home   I discussed the limitations of evaluation and management by telemedicine and the availability of in person appointments. The patient expressed understanding and agreed to proceed. Magdalene Molly, CMA was able to get the patient set up on a video visit    Subjective:    Patient ID: Kathryn [name], female    DOB: [birth year], 23 y.o.   MRN: [id number]  No chief complaint on file.   HPI Patient is in today for follow up. She is struggling with increased irritability, difficulty concentrating, increased lability and anhedonia. No suicidal ideation but she feels she needs to go see psychiatry. No recent febrile illness or hospitalizations. Denies CP/palp/SOB/HA/congestion/fevers/GI or GU c/o. Taking meds as prescribed  Past Medical History:  Diagnosis Date  . Abnormal liver function tests 02/02/2016  . ADHD 06/10/2016  . Alcohol use 02/23/2016  . Allergy   . Anxiety   . Arm fracture, right    as child, fx in three places, no surgery  . Depression   . Depression with anxiety 04-16-2014   PTSD s/p death of best friend   . Essential hypertension 02/02/2016  . GERD (gastroesophageal reflux disease)   . 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 Headache 04/09/2016  . Hyperlipidemia, mixed 02/02/2016  . Migraine   . Ovarian cyst   . PCOS (polycystic ovarian syndrome)   . Preventative health care 10/27/2015  . Thrombocytosis ([place]) 04/09/2016    Past Surgical History:  Procedure Laterality Date  . ADENOIDECTOMY  2002  . TYMPANOSTOMY TUBE PLACEMENT Bilateral 1999    Family History  Problem Relation Age of Onset  . Hypertension Mother   . Heart disease Mother        CAD, stent at 53, s/p cardiac arrest with Defib  . Hyperlipidemia Mother   .  Diabetes Mother        s/p gastric bypass  . Obesity Mother        s/p gastric bypass  . Hyperlipidemia Father   . Hypertension Father   . Asthma Sister   . Hyperlipidemia Brother   . Stroke Maternal Grandmother   . Thyroid disease Maternal Grandmother   . Heart disease Maternal Grandfather        MI first at 24, s/p stents and CABG  . Hyperlipidemia Maternal Grandfather   . Hypertension Maternal Grandfather   . Stroke Maternal Grandfather   . Cancer Maternal Grandfather        melanoma  . Aneurysm Maternal Grandfather        brain  . Kidney disease Maternal Grandfather        tumor  . Heart disease Paternal Grandmother   . Cancer Paternal Lucilla Edin and brain  . Heart disease Maternal Uncle   . Vision loss Maternal Uncle     Social History   Socioeconomic History  . Marital status: Single    Spouse name: Not on file  . Number of children: 0  . Years of education: 38  . Highest education level: Not on file  Occupational History    Comment: [employer]  Social Needs  . Financial resource strain: Not on file  . Food insecurity:    Worry: Not on file  Inability: Not on file  . Transportation needs:    Medical: Not on file    Non-medical: Not on file  Tobacco Use  . Smoking status: Heavy Tobacco Smoker    Packs/day: 1.50    Types: Cigarettes  . Smokeless tobacco: Current User    Types: Snuff  Substance and Sexual Activity  . Alcohol use: No    Alcohol/week: 0.0 standard drinks    Comment: maybe every 6 months  . Drug use: Yes    Types: Marijuana    Comment: 08/04/17 "weed daily"  . Sexual activity: Yes    Birth control/protection: Pill    Comment: lives with partner, works for [employer], no diewtary restrictions.   Lifestyle  . Physical activity:    Days per week: Not on file    Minutes per session: Not on file  . Stress: Not on file  Relationships  . Social connections:    Talks on phone: Not on file    Gets together: Not on  file    Attends religious service: Not on file    Active member of club or organization: Not on file    Attends meetings of clubs or organizations: Not on file    Relationship status: Not on file  . Intimate partner violence:    Fear of current or ex partner: Not on file    Emotionally abused: Not on file    Physically abused: Not on file    Forced sexual activity: Not on file  Other Topics Concern  . Not on file  Social History Narrative   Lives with girlfriend's family   Caffeine- soda and tea, 6 cans daily    Outpatient Medications Prior to Visit  Medication Sig Dispense Refill  . albuterol (PROVENTIL HFA;VENTOLIN HFA) 108 (90 Base) MCG/ACT inhaler Inhale 2 puffs into the lungs every 6 (six) hours as needed for wheezing or shortness of breath. 3 Inhaler 0  . benzonatate (TESSALON) 100 MG capsule Take 1 capsule (100 mg total) by mouth 3 (three) times daily as needed. (Patient not taking: Reported on 04/27/2018) 30 capsule 0  . busPIRone (BUSPAR) 10 MG tablet Take 1 tablet (10 mg total) by mouth 3 (three) times daily. 90 tablet 1  . famotidine (PEPCID) 20 MG tablet Take 1 tablet (20 mg total) by mouth 2 (two) times daily. 180 tablet 0  . fluconazole (DIFLUCAN) 150 MG tablet Take 1 tablet (150 mg total) by mouth once a week. (Patient not taking: Reported on 04/27/2018) 2 tablet 1  . lamoTRIgine (LAMICTAL) 100 MG tablet Take 1 tablet (100 mg total) by mouth daily. Take one a day 30 tablet 1  . Levonorgestrel-Ethinyl Estradiol (AMETHIA,CAMRESE) 0.15-0.03 &0.01 MG tablet TAKE 1 TABLET BY MOUTH EVERY DAY 91 tablet 1  . mometasone-formoterol (DULERA) 100-5 MCG/ACT AERO Inhale 2 puffs into the lungs 2 (two) times daily. 3 Inhaler 1  . montelukast (SINGULAIR) 10 MG tablet Take 1 tablet (10 mg total) by mouth at bedtime. 90 tablet 1  . ondansetron (ZOFRAN) 4 MG tablet Take 1 tablet (4 mg total) by mouth every 8 (eight) hours as needed for nausea or vomiting. (Patient not taking: Reported on  04/27/2018) 20 tablet 0  . valACYclovir (VALTREX) 1000 MG tablet Take 1 tablet (1,000 mg total) by mouth 2 (two) times daily. 60 tablet 5  . venlafaxine XR (EFFEXOR-XR) 75 MG 24 hr capsule Take 1 capsule (75 mg total) by mouth daily with breakfast. 90 capsule 1  . amphetamine-dextroamphetamine (ADDERALL) 10  MG tablet Take 1 tablet (10 mg total) by mouth 2 (two) times daily with a meal. March 2020 60 tablet 0  . amphetamine-dextroamphetamine (ADDERALL) 10 MG tablet Take 1 tablet (10 mg total) by mouth 2 (two) times daily with a meal. May 2020 60 tablet 0  . amphetamine-dextroamphetamine (ADDERALL) 10 MG tablet Take 1 tablet (10 mg total) by mouth 2 (two) times daily with a meal. April 2020 60 tablet 0  . Dexlansoprazole 30 MG capsule Take 30 mg by mouth daily.    Marland Kitchen gabapentin (NEURONTIN) 300 MG capsule Take 1 capsule (300 mg total) by mouth 3 (three) times daily. 90 capsule 3  . omeprazole (PRILOSEC) 40 MG capsule Take 1 capsule (40 mg total) by mouth daily. 30 capsule 3   No facility-administered medications prior to visit.     Allergies  Allergen Reactions  . Oxycodone Nausea Only    Oxycontin also  . Seroquel [Quetiapine Fumarate]     Mental status changes    Review of Systems  Constitutional: Negative for fever and malaise/fatigue.  HENT: Negative for congestion.   Eyes: Negative for blurred vision.  Respiratory: Negative for shortness of breath.   Cardiovascular: Negative for chest pain, palpitations and leg swelling.  Gastrointestinal: Negative for abdominal pain, blood in stool and nausea.  Genitourinary: Negative for dysuria and frequency.  Musculoskeletal: Negative for falls.  Skin: Negative for rash.  Neurological: Negative for dizziness, loss of consciousness and headaches.  Endo/Heme/Allergies: Negative for environmental allergies.  Psychiatric/Behavioral: Positive for depression. The patient is nervous/anxious and has insomnia.        Objective:    Physical Exam  Constitutional:      Appearance: Normal appearance.  HENT:     Head: Normocephalic and atraumatic.     Nose: Nose normal.  Pulmonary:     Effort: Pulmonary effort is normal.  Neurological:     Mental Status: She is alert and oriented to person, place, and time.  Psychiatric:        Mood and Affect: Mood normal.        Behavior: Behavior normal.     There were no vitals taken for this visit. Wt Readings from Last 3 Encounters:  03/17/18 162 lb (73.5 kg)  02/03/18 168 lb 8 oz (76.4 kg)  01/06/18 175 lb 9.6 oz (79.7 kg)    Diabetic Foot Exam - Simple   No data filed     Lab Results  Component Value Date   WBC 9.0 07/14/2017   HGB 14.5 07/14/2017   HCT 42.3 07/14/2017   PLT 413 (H) 07/14/2017   GLUCOSE 104 (H) 05/05/2017   CHOL 218 (H) 05/05/2017   TRIG 107.0 05/05/2017   HDL 42.00 05/05/2017   LDLDIRECT 138.0 01/22/2016   LDLCALC 155 (H) 05/05/2017   ALT 28 05/05/2017   AST 22 05/05/2017   NA 146 (H) 05/05/2017   K 4.0 05/05/2017   CL 107 05/05/2017   CREATININE 0.90 05/05/2017   BUN 7 05/05/2017   CO2 26 05/05/2017   TSH 2.49 05/05/2017   HGBA1C 5.5 08/04/2017    Lab Results  Component Value Date   TSH 2.49 05/05/2017   Lab Results  Component Value Date   WBC 9.0 07/14/2017   HGB 14.5 07/14/2017   HCT 42.3 07/14/2017   MCV 89.8 07/14/2017   PLT 413 (H) 07/14/2017   Lab Results  Component Value Date   NA 146 (H) 05/05/2017   K 4.0 05/05/2017   CO2 26  05/05/2017   GLUCOSE 104 (H) 05/05/2017   BUN 7 05/05/2017   CREATININE 0.90 05/05/2017   BILITOT 0.4 05/05/2017   ALKPHOS 68 05/05/2017   AST 22 05/05/2017   ALT 28 05/05/2017   PROT 7.2 05/05/2017   ALBUMIN 4.8 05/05/2017   CALCIUM 10.2 05/05/2017   ANIONGAP 10 03/21/2017   GFR 82.98 05/05/2017   Lab Results  Component Value Date   CHOL 218 (H) 05/05/2017   Lab Results  Component Value Date   HDL 42.00 05/05/2017   Lab Results  Component Value Date   LDLCALC 155 (H) 05/05/2017    Lab Results  Component Value Date   TRIG 107.0 05/05/2017   Lab Results  Component Value Date   CHOLHDL 5 05/05/2017   Lab Results  Component Value Date   HGBA1C 5.5 08/04/2017       Assessment & Plan:   Problem List Items Addressed This Visit    Insomnia    Encouraged good sleep hygiene such as dark, quiet room. No blue/green glowing lights such as computer screens in bedroom. No alcohol or stimulants in evening. Cut down on caffeine as able. Regular exercise is helpful but not just prior to bed time. Try hydroxyzine 10 mg to start. She had a rash previously but in retrospect does not believe it was the medication.  Referred to counselor and psychiatry      Relevant Orders   Ambulatory referral to [place]    Other Visit Diagnoses    Bipolar depression ([place])    -  Primary   Relevant Orders   Ambulatory referral to Psychiatry   Ambulatory referral to Behavioral Health   Attention deficit disorder, unspecified hyperactivity presence       Relevant Medications   amphetamine-dextroamphetamine (ADDERALL) 10 MG tablet   amphetamine-dextroamphetamine (ADDERALL) 10 MG tablet   amphetamine-dextroamphetamine (ADDERALL) 10 MG tablet   Other Relevant Orders   Ambulatory referral to [place]      I have discontinued [facility]. I have also changed her amphetamine-dextroamphetamine, amphetamine-dextroamphetamine, amphetamine-dextroamphetamine, and omeprazole. Additionally, I am having her start on hydrOXYzine. Lastly, I am having her maintain her albuterol, mometasone-formoterol, Levonorgestrel-Ethinyl Estradiol, fluconazole, valACYclovir, venlafaxine XR, ondansetron, benzonatate, busPIRone, montelukast, famotidine, lamoTRIgine, and gabapentin.  Meds ordered this encounter  Medications  . gabapentin (NEURONTIN) 300 MG capsule    Sig: Take 1 capsule (300 mg total) by mouth 3 (three) times daily.    Dispense:  270 capsule    Refill:  3  .  amphetamine-dextroamphetamine (ADDERALL) 10 MG tablet    Sig: Take 1 tablet (10 mg total) by mouth 2 (two) times daily with a meal. August 2020    Dispense:  60 tablet    Refill:  0  . amphetamine-dextroamphetamine (ADDERALL) 10 MG tablet    Sig: Take 1 tablet (10 mg total) by mouth 2 (two) times daily with a meal. July 2020    Dispense:  60 tablet    Refill:  0  . amphetamine-dextroamphetamine (ADDERALL) 10 MG tablet    Sig: Take 1 tablet (10 mg total) by mouth 2 (two) times daily with a meal. June 2020    Dispense:  60 tablet    Refill:  0  . hydrOXYzine (ATARAX/VISTARIL) 10 MG tablet    Sig: Take 1-3 tablets (10-30 mg total) by mouth at bedtime as needed (insomnia).    Dispense:  40 tablet    Refill:  1  . omeprazole (PRILOSEC) 40 MG capsule  Sig: Take 1 capsule (40 mg total) by mouth 2 (two) times daily.    Dispense:  180 capsule    Refill:  3     I discussed the assessment and treatment plan with the patient. The patient was provided an opportunity to ask questions and all were answered. The patient agreed with the plan and demonstrated an understanding of the instructions.   The patient was advised to call back or seek an in-person evaluation if the symptoms worsen or if the condition fails to improve as anticipated.  I provided 25 minutes of non-face-to-face time during this encounter.   Penni Homans, MD

## 2018-06-26 NOTE — Assessment & Plan Note (Addendum)
Encouraged good sleep hygiene such as dark, quiet room. No blue/green glowing lights such as computer screens in bedroom. No alcohol or stimulants in evening. Cut down on caffeine as able. Regular exercise is helpful but not just prior to bed time. Try hydroxyzine 10 mg to start. She had a rash previously but in retrospect does not believe it was the medication.  Referred to counselor and psychiatry

## 2018-07-04 ENCOUNTER — Other Ambulatory Visit: Payer: Self-pay

## 2018-07-04 ENCOUNTER — Ambulatory Visit (INDEPENDENT_AMBULATORY_CARE_PROVIDER_SITE_OTHER): Payer: Managed Care, Other (non HMO) | Admitting: Family Medicine

## 2018-07-04 ENCOUNTER — Encounter: Payer: Self-pay | Admitting: Family Medicine

## 2018-07-04 DIAGNOSIS — F901 Attention-deficit hyperactivity disorder, predominantly hyperactive type: Secondary | ICD-10-CM | POA: Diagnosis not present

## 2018-07-04 DIAGNOSIS — G4700 Insomnia, unspecified: Secondary | ICD-10-CM

## 2018-07-04 DIAGNOSIS — I10 Essential (primary) hypertension: Secondary | ICD-10-CM

## 2018-07-04 MED ORDER — ZOLPIDEM TARTRATE 5 MG PO TABS: 2.5000 mg | ORAL_TABLET | Freq: Every evening | ORAL | 1 refills | Status: DC | PRN

## 2018-07-04 NOTE — Assessment & Plan Note (Signed)
no changes to meds. Encouraged heart healthy diet such as the DASH diet and exercise as tolerated.  

## 2018-07-04 NOTE — Progress Notes (Signed)
Virtual Visit via Video Note  I connected with Kathryn [name] on 07/04/18 at  9:40 AM EDT by a video enabled telemedicine application and verified that I am speaking with the correct person using two identifiers.  Location: Patient: home Provider: home   I discussed the limitations of evaluation and management by telemedicine and the availability of in person appointments. The patient expressed understanding and agreed to proceed. Alinda Dooms, CMA was able to get patient set up on video visit    Subjective:    Patient ID: Kathryn [name], female    DOB: 1995-12-03, 23 y.o.   MRN: [id number]  Chief Complaint  Patient presents with  . Bipolar Depression  . Medication Management    Pt states hydroxyzine is not working.   . Follow-up    HPI Patient is in today for follow up on bipolar disorder, insomnia, anxiety, hypertension and more. She feels much better today. She feels more stabilized and her mood is improved. She was having a struggle with a supervisor at her workplace, [employer]. Fortunately he was transferred to a new site yesterday so that has been helpful also. The Hydroxyzine did not help her sleep and caused some disturbing dreams so she stopped it. No new concerns or recent illness. Denies CP/palp/SOB/HA/congestion/fevers/GI or GU c/o. Taking meds as prescribed  Past Medical History:  Diagnosis Date  . Abnormal liver function tests 02/02/2016  . ADHD 06/10/2016  . Alcohol use 02/23/2016  . Allergy   . Anxiety   . Arm fracture, right    as child, fx in three places, no surgery  . Depression   . Depression with anxiety 21-Apr-2014   PTSD s/p death of best friend   . Essential hypertension 02/02/2016  . GERD (gastroesophageal reflux disease)   . 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 Headache 04/09/2016  . Hyperlipidemia, mixed 02/02/2016  . Migraine   . Ovarian cyst   . PCOS (polycystic ovarian syndrome)   . Preventative health care 10/27/2015  . Thrombocytosis ([place]) 04/09/2016     Past Surgical History:  Procedure Laterality Date  . ADENOIDECTOMY  2002  . TYMPANOSTOMY TUBE PLACEMENT Bilateral 1999    Family History  Problem Relation Age of Onset  . Hypertension Mother   . Heart disease Mother        CAD, stent at 31, s/p cardiac arrest with Defib  . Hyperlipidemia Mother   . Diabetes Mother        s/p gastric bypass  . Obesity Mother        s/p gastric bypass  . Hyperlipidemia Father   . Hypertension Father   . Asthma Sister   . Hyperlipidemia Brother   . Stroke Maternal Grandmother   . Thyroid disease Maternal Grandmother   . Heart disease Maternal Grandfather        MI first at 85, s/p stents and CABG  . Hyperlipidemia Maternal Grandfather   . Hypertension Maternal Grandfather   . Stroke Maternal Grandfather   . Cancer Maternal Grandfather        melanoma  . Aneurysm Maternal Grandfather        brain  . Kidney disease Maternal Grandfather        tumor  . Heart disease Paternal Grandmother   . Cancer Paternal Lucilla Edin and brain  . Heart disease Maternal Uncle   . Vision loss Maternal Uncle     Social History   Socioeconomic History  . Marital status: Single  Spouse name: Not on file  . Number of children: 0  . Years of education: 58  . Highest education level: Not on file  Occupational History    Comment: [employer]  Social Needs  . Financial resource strain: Not on file  . Food insecurity:    Worry: Not on file    Inability: Not on file  . Transportation needs:    Medical: Not on file    Non-medical: Not on file  Tobacco Use  . Smoking status: Heavy Tobacco Smoker    Packs/day: 1.50    Types: Cigarettes  . Smokeless tobacco: Current User    Types: Snuff  Substance and Sexual Activity  . Alcohol use: No    Alcohol/week: 0.0 standard drinks    Comment: maybe every 6 months  . Drug use: Yes    Types: Marijuana    Comment: 08/04/17 "weed daily"  . Sexual activity: Yes    Birth control/protection: Pill     Comment: lives with partner, works for [employer], no diewtary restrictions.   Lifestyle  . Physical activity:    Days per week: Not on file    Minutes per session: Not on file  . Stress: Not on file  Relationships  . Social connections:    Talks on phone: Not on file    Gets together: Not on file    Attends religious service: Not on file    Active member of club or organization: Not on file    Attends meetings of clubs or organizations: Not on file    Relationship status: Not on file  . Intimate partner violence:    Fear of current or ex partner: Not on file    Emotionally abused: Not on file    Physically abused: Not on file    Forced sexual activity: Not on file  Other Topics Concern  . Not on file  Social History Narrative   Lives with girlfriend's family   Caffeine- soda and tea, 6 cans daily    Outpatient Medications Prior to Visit  Medication Sig Dispense Refill  . albuterol (PROVENTIL HFA;VENTOLIN HFA) 108 (90 Base) MCG/ACT inhaler Inhale 2 puffs into the lungs every 6 (six) hours as needed for wheezing or shortness of breath. 3 Inhaler 0  . amphetamine-dextroamphetamine (ADDERALL) 10 MG tablet Take 1 tablet (10 mg total) by mouth 2 (two) times daily with a meal. August 2020 60 tablet 0  . amphetamine-dextroamphetamine (ADDERALL) 10 MG tablet Take 1 tablet (10 mg total) by mouth 2 (two) times daily with a meal. July 2020 60 tablet 0  . amphetamine-dextroamphetamine (ADDERALL) 10 MG tablet Take 1 tablet (10 mg total) by mouth 2 (two) times daily with a meal. June 2020 60 tablet 0  . busPIRone (BUSPAR) 10 MG tablet Take 1 tablet (10 mg total) by mouth 3 (three) times daily. 90 tablet 1  . famotidine (PEPCID) 20 MG tablet Take 1 tablet (20 mg total) by mouth 2 (two) times daily. 180 tablet 0  . gabapentin (NEURONTIN) 300 MG capsule Take 1 capsule (300 mg total) by mouth 3 (three) times daily. 270 capsule 3  . lamoTRIgine (LAMICTAL) 100 MG tablet Take 1 tablet (100 mg  total) by mouth daily. Take one a day 30 tablet 1  . Levonorgestrel-Ethinyl Estradiol (AMETHIA,CAMRESE) 0.15-0.03 &0.01 MG tablet TAKE 1 TABLET BY MOUTH EVERY DAY 91 tablet 1  . mometasone-formoterol (DULERA) 100-5 MCG/ACT AERO Inhale 2 puffs into the lungs 2 (two) times daily. 3 Inhaler 1  .  montelukast (SINGULAIR) 10 MG tablet Take 1 tablet (10 mg total) by mouth at bedtime. 90 tablet 1  . omeprazole (PRILOSEC) 40 MG capsule Take 1 capsule (40 mg total) by mouth 2 (two) times daily. 180 capsule 3  . ondansetron (ZOFRAN) 4 MG tablet Take 1 tablet (4 mg total) by mouth every 8 (eight) hours as needed for nausea or vomiting. 20 tablet 0  . valACYclovir (VALTREX) 1000 MG tablet Take 1 tablet (1,000 mg total) by mouth 2 (two) times daily. 60 tablet 5  . venlafaxine XR (EFFEXOR-XR) 75 MG 24 hr capsule Take 1 capsule (75 mg total) by mouth daily with breakfast. 90 capsule 1  . fluconazole (DIFLUCAN) 150 MG tablet Take 1 tablet (150 mg total) by mouth once a week. 2 tablet 1  . hydrOXYzine (ATARAX/VISTARIL) 10 MG tablet Take 1-3 tablets (10-30 mg total) by mouth at bedtime as needed (insomnia). 40 tablet 1  . benzonatate (TESSALON) 100 MG capsule Take 1 capsule (100 mg total) by mouth 3 (three) times daily as needed. (Patient not taking: Reported on 07/04/2018) 30 capsule 0   No facility-administered medications prior to visit.     Allergies  Allergen Reactions  . Oxycodone Nausea Only    Oxycontin also  . Seroquel [Quetiapine Fumarate]     Mental status changes    Review of Systems  Constitutional: Negative for fever and malaise/fatigue.  HENT: Negative for congestion.   Eyes: Negative for blurred vision.  Respiratory: Negative for shortness of breath.   Cardiovascular: Negative for chest pain, palpitations and leg swelling.  Gastrointestinal: Negative for abdominal pain, blood in stool and nausea.  Genitourinary: Negative for dysuria and frequency.  Musculoskeletal: Negative for falls.   Skin: Negative for rash.  Neurological: Negative for dizziness, loss of consciousness and headaches.  Endo/Heme/Allergies: Negative for environmental allergies.  Psychiatric/Behavioral: Negative for depression. The patient is nervous/anxious and has insomnia.        Objective:    Physical Exam Constitutional:      Appearance: Normal appearance. She is not ill-appearing.  HENT:     Head: Normocephalic and atraumatic.  Eyes:     General:        Right eye: No discharge.        Left eye: No discharge.  Pulmonary:     Effort: Pulmonary effort is normal.  Neurological:     Mental Status: She is alert and oriented to person, place, and time.  Psychiatric:        Mood and Affect: Mood normal.        Behavior: Behavior normal.     BP (!) 141/84   Pulse 79   Ht 5\' 8"  (1.727 m)   Wt 152 lb 9.6 oz (69.2 kg)   BMI 23.20 kg/m  Wt Readings from Last 3 Encounters:  07/04/18 152 lb 9.6 oz (69.2 kg)  03/17/18 162 lb (73.5 kg)  02/03/18 168 lb 8 oz (76.4 kg)    Diabetic Foot Exam - Simple   No data filed     Lab Results  Component Value Date   WBC 9.0 07/14/2017   HGB 14.5 07/14/2017   HCT 42.3 07/14/2017   PLT 413 (H) 07/14/2017   GLUCOSE 104 (H) 05/05/2017   CHOL 218 (H) 05/05/2017   TRIG 107.0 05/05/2017   HDL 42.00 05/05/2017   LDLDIRECT 138.0 01/22/2016   LDLCALC 155 (H) 05/05/2017   ALT 28 05/05/2017   AST 22 05/05/2017   NA 146 (H) 05/05/2017  K 4.0 05/05/2017   CL 107 05/05/2017   CREATININE 0.90 05/05/2017   BUN 7 05/05/2017   CO2 26 05/05/2017   TSH 2.49 05/05/2017   HGBA1C 5.5 08/04/2017    Lab Results  Component Value Date   TSH 2.49 05/05/2017   Lab Results  Component Value Date   WBC 9.0 07/14/2017   HGB 14.5 07/14/2017   HCT 42.3 07/14/2017   MCV 89.8 07/14/2017   PLT 413 (H) 07/14/2017   Lab Results  Component Value Date   NA 146 (H) 05/05/2017   K 4.0 05/05/2017   CO2 26 05/05/2017   GLUCOSE 104 (H) 05/05/2017   BUN 7 05/05/2017    CREATININE 0.90 05/05/2017   BILITOT 0.4 05/05/2017   ALKPHOS 68 05/05/2017   AST 22 05/05/2017   ALT 28 05/05/2017   PROT 7.2 05/05/2017   ALBUMIN 4.8 05/05/2017   CALCIUM 10.2 05/05/2017   ANIONGAP 10 03/21/2017   GFR 82.98 05/05/2017   Lab Results  Component Value Date   CHOL 218 (H) 05/05/2017   Lab Results  Component Value Date   HDL 42.00 05/05/2017   Lab Results  Component Value Date   LDLCALC 155 (H) 05/05/2017   Lab Results  Component Value Date   TRIG 107.0 05/05/2017   Lab Results  Component Value Date   CHOLHDL 5 05/05/2017   Lab Results  Component Value Date   HGBA1C 5.5 08/04/2017       Assessment & Plan:   Problem List Items Addressed This Visit    Insomnia    Hydroxyzine was not helpful and caused disturbing dreams so will stop it and she has used Ambien 5 mg in past so will allow a prescription of this to try 2.5 to 5 mg qhs and see if that helps reassess in 3-4 weeks.      Essential hypertension     no changes to meds. Encouraged heart healthy diet such as the DASH diet and exercise as tolerated.       ADHD    Doing better on current meds.          I have discontinued Chatara Fisher's fluconazole, benzonatate, and hydrOXYzine. I am also having her start on zolpidem. Additionally, I am having her maintain her albuterol, mometasone-formoterol, Levonorgestrel-Ethinyl Estradiol, valACYclovir, venlafaxine XR, ondansetron, busPIRone, montelukast, famotidine, lamoTRIgine, gabapentin, amphetamine-dextroamphetamine, amphetamine-dextroamphetamine, amphetamine-dextroamphetamine, and omeprazole.  Meds ordered this encounter  Medications  . zolpidem (AMBIEN) 5 MG tablet    Sig: Take 0.5-1 tablets (2.5-5 mg total) by mouth at bedtime as needed for sleep.    Dispense:  30 tablet    Refill:  1    I discussed the assessment and treatment plan with the patient. The patient was provided an opportunity to ask questions and all were answered. The patient  agreed with the plan and demonstrated an understanding of the instructions.   The patient was advised to call back or seek an in-person evaluation if the symptoms worsen or if the condition fails to improve as anticipated.  I provided 25 minutes of non-face-to-face time during this encounter.   Penni Homans, MD

## 2018-07-04 NOTE — Assessment & Plan Note (Signed)
Doing better on current meds

## 2018-07-04 NOTE — Assessment & Plan Note (Signed)
Hydroxyzine was not helpful and caused disturbing dreams so will stop it and she has used Ambien 5 mg in past so will allow a prescription of this to try 2.5 to 5 mg qhs and see if that helps reassess in 3-4 weeks.

## 2018-07-06 ENCOUNTER — Encounter: Payer: Self-pay | Admitting: Family Medicine

## 2018-07-07 ENCOUNTER — Other Ambulatory Visit: Payer: Self-pay

## 2018-07-07 ENCOUNTER — Ambulatory Visit: Payer: Managed Care, Other (non HMO) | Admitting: Family Medicine

## 2018-07-07 ENCOUNTER — Telehealth: Payer: Self-pay | Admitting: Family Medicine

## 2018-07-07 ENCOUNTER — Ambulatory Visit (HOSPITAL_COMMUNITY): Payer: 59 | Admitting: Psychiatry

## 2018-07-07 NOTE — Telephone Encounter (Signed)
Pt called wanting an appt with Dr. Charlett Blake ASAP  She had concerns of "Work Medicine & her mood" I offered 1st avb appt patient stated that wasn't good enough. I offered another provider multiple times she stated only Dr. Charlett Blake could help her and hung up very upset.... Not sure if someone wants to call her. But I wanted to send over the telephone encounter.

## 2018-07-07 NOTE — Telephone Encounter (Signed)
Will see patient this evening if she gets the messages we left for her.

## 2018-07-08 ENCOUNTER — Ambulatory Visit (INDEPENDENT_AMBULATORY_CARE_PROVIDER_SITE_OTHER): Payer: Managed Care, Other (non HMO) | Admitting: Family Medicine

## 2018-07-08 ENCOUNTER — Other Ambulatory Visit: Payer: Self-pay

## 2018-07-08 DIAGNOSIS — F418 Other specified anxiety disorders: Secondary | ICD-10-CM | POA: Diagnosis not present

## 2018-07-11 NOTE — Assessment & Plan Note (Addendum)
She notes she feels less anxious and her mind is racing less with the addition of Buspar bid she will increase to tid. She is given the phone number for urgent evaluation with [facility] and she is taken out of work for the next 30 days. She has an appointment with counselor in 2 weeks and is [place]. Spent 35 minutes in counseling with patient

## 2018-07-11 NOTE — Progress Notes (Signed)
Virtual Visit via Video Note  I connected with [name] on 07/08/2018 at 10:40 AM EDT by a video enabled telemedicine application and verified that I am speaking with the correct person using two identifiers.  Location: Patient: home Provider: home    I discussed the limitations of evaluation and management by telemedicine and the availability of in person appointments. The patient expressed understanding and agreed to proceed. Magdalene Molly, CMA was able to get the patient set up on video visit    Subjective:    Patient ID: Kathryn [name], female    DOB: April 28, 1995, 23 y.o.   MRN: [id number]  No chief complaint on file.   HPI Patient is in today for evaluation of persistent anxiety and depression. She has been having anxiety attacks where her mind races and she is unable to get calm. She is shaky and she has decided she is unable to work at this time. She had a very difficult boss and although he has been transferred she still feels very anxious. She feels the Buspar has helped her racing thoughts. She endorses anhedonia but not suicidal ideation. [place] family but has not decided yet. Denies CP/HA/congestion/fevers/GI or GU c/o. Taking meds as prescribed  Past Medical History:  Diagnosis Date  . Abnormal liver function tests 02/02/2016  . ADHD 06/10/2016  . Alcohol use 02/23/2016  . Allergy   . Anxiety   . Arm fracture, right    as child, fx in three places, no surgery  . Depression   . Depression with anxiety 05/11/14   PTSD s/p death of best friend   . Essential hypertension 02/02/2016  . GERD (gastroesophageal reflux disease)   . 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 Headache 04/09/2016  . Hyperlipidemia, mixed 02/02/2016  . Migraine   . Ovarian cyst   . PCOS (polycystic ovarian syndrome)   . Preventative health care 10/27/2015  . Thrombocytosis ([place]) 04/09/2016    Past Surgical History:  Procedure Laterality Date  .  ADENOIDECTOMY  2002  . TYMPANOSTOMY TUBE PLACEMENT Bilateral 1999    Family History  Problem Relation Age of Onset  . Hypertension Mother   . Heart disease Mother        CAD, stent at 70, s/p cardiac arrest with Defib  . Hyperlipidemia Mother   . Diabetes Mother        s/p gastric bypass  . Obesity Mother        s/p gastric bypass  . Hyperlipidemia Father   . Hypertension Father   . Asthma Sister   . Hyperlipidemia Brother   . Stroke Maternal Grandmother   . Thyroid disease Maternal Grandmother   . Heart disease Maternal Grandfather        MI first at 8, s/p stents and CABG  . Hyperlipidemia Maternal Grandfather   . Hypertension Maternal Grandfather   . Stroke Maternal Grandfather   . Cancer Maternal Grandfather        melanoma  . Aneurysm Maternal Grandfather        brain  . Kidney disease Maternal Grandfather        tumor  . Heart disease Paternal Grandmother   . Cancer Paternal Lucilla Edin and brain  . Heart disease Maternal Uncle   . Vision loss Maternal Uncle     Social History   Socioeconomic History  . Marital status: Single    Spouse name:  Not on file  . Number of children: 0  . Years of education: 51  . Highest education level: Not on file  Occupational History    Comment: [employer]  Social Needs  . Financial resource strain: Not on file  . Food insecurity:    Worry: Not on file    Inability: Not on file  . Transportation needs:    Medical: Not on file    Non-medical: Not on file  Tobacco Use  . Smoking status: Heavy Tobacco Smoker    Packs/day: 1.50    Types: Cigarettes  . Smokeless tobacco: Current User    Types: Snuff  Substance and Sexual Activity  . Alcohol use: No    Alcohol/week: 0.0 standard drinks    Comment: maybe every 6 months  . Drug use: Yes    Types: Marijuana    Comment: 08/04/17 "weed daily"  . Sexual activity: Yes    Birth control/protection: Pill    Comment: lives with partner, works for [employer], no diewtary restrictions.   Lifestyle  . Physical activity:    Days per week: Not on file    Minutes per session: Not on file  . Stress: Not on file  Relationships  . Social connections:    Talks on phone: Not on file    Gets together: Not on file    Attends religious service: Not on file    Active member of club or organization: Not on file    Attends meetings of clubs or organizations: Not on file    Relationship status: Not on file  . Intimate partner violence:    Fear of current or ex partner: Not on file    Emotionally abused: Not on file    Physically abused: Not on file    Forced sexual activity: Not on file  Other Topics Concern  . Not on file  Social History Narrative   Lives with girlfriend's family   Caffeine- soda and tea, 6 cans daily    Outpatient Medications Prior to Visit  Medication Sig Dispense Refill  . albuterol (PROVENTIL HFA;VENTOLIN HFA) 108 (90 Base) MCG/ACT inhaler Inhale 2 puffs into the lungs every 6 (six) hours as needed for wheezing or shortness of breath. 3 Inhaler 0  . amphetamine-dextroamphetamine (ADDERALL) 10 MG tablet Take 1 tablet (10 mg total) by mouth 2 (two) times daily with a meal. August 2020 60 tablet 0  . amphetamine-dextroamphetamine (ADDERALL) 10 MG tablet Take 1 tablet (10 mg total) by mouth 2 (two) times daily with a meal. July 2020 60 tablet 0  . amphetamine-dextroamphetamine (ADDERALL) 10 MG tablet Take 1 tablet (10 mg total) by mouth 2 (two) times daily with a meal. June 2020 60 tablet 0  . busPIRone (BUSPAR) 10 MG tablet Take 1 tablet (10 mg total) by mouth 3 (three) times daily. 90 tablet 1  . famotidine (PEPCID) 20 MG tablet Take 1 tablet (20 mg total) by mouth 2 (two) times daily. 180 tablet 0  . gabapentin (NEURONTIN) 300 MG capsule Take 1 capsule (300 mg total) by mouth 3 (three) times daily. 270 capsule 3  . lamoTRIgine (LAMICTAL) 100 MG tablet Take 1 tablet (100 mg total) by mouth daily. Take one a day 30 tablet 1   . Levonorgestrel-Ethinyl Estradiol (AMETHIA,CAMRESE) 0.15-0.03 &0.01 MG tablet TAKE 1 TABLET BY MOUTH EVERY DAY 91 tablet 1  . mometasone-formoterol (DULERA) 100-5 MCG/ACT AERO Inhale 2 puffs into the lungs 2 (two) times daily. 3 Inhaler 1  .  montelukast (SINGULAIR) 10 MG tablet Take 1 tablet (10 mg total) by mouth at bedtime. 90 tablet 1  . omeprazole (PRILOSEC) 40 MG capsule Take 1 capsule (40 mg total) by mouth 2 (two) times daily. 180 capsule 3  . ondansetron (ZOFRAN) 4 MG tablet Take 1 tablet (4 mg total) by mouth every 8 (eight) hours as needed for nausea or vomiting. 20 tablet 0  . valACYclovir (VALTREX) 1000 MG tablet Take 1 tablet (1,000 mg total) by mouth 2 (two) times daily. 60 tablet 5  . venlafaxine XR (EFFEXOR-XR) 75 MG 24 hr capsule Take 1 capsule (75 mg total) by mouth daily with breakfast. 90 capsule 1  . zolpidem (AMBIEN) 5 MG tablet Take 0.5-1 tablets (2.5-5 mg total) by mouth at bedtime as needed for sleep. 30 tablet 1   No facility-administered medications prior to visit.     Allergies  Allergen Reactions  . Oxycodone Nausea Only    Oxycontin also  . Seroquel [Quetiapine Fumarate]     Mental status changes    Review of Systems  Constitutional: Negative for fever and malaise/fatigue.  HENT: Negative for congestion.   Eyes: Negative for blurred vision.  Respiratory: Negative for shortness of breath.   Cardiovascular: Negative for chest pain, palpitations and leg swelling.  Gastrointestinal: Negative for abdominal pain, blood in stool and nausea.  Genitourinary: Negative for dysuria and frequency.  Musculoskeletal: Negative for falls.  Skin: Negative for rash.  Neurological: Negative for dizziness, loss of consciousness and headaches.  Endo/Heme/Allergies: Negative for environmental allergies.  Psychiatric/Behavioral: Negative for depression. The patient is not nervous/anxious.        Objective:    Physical Exam Constitutional:      Appearance: Normal  appearance. She is not ill-appearing.  HENT:     Head: Normocephalic and atraumatic.     Nose: Nose normal.  Pulmonary:     Effort: Pulmonary effort is normal.  Neurological:     Mental Status: She is alert and oriented to person, place, and time.  Psychiatric:        Behavior: Behavior normal.     There were no vitals taken for this visit. Wt Readings from Last 3 Encounters:  07/04/18 152 lb 9.6 oz (69.2 kg)  03/17/18 162 lb (73.5 kg)  02/03/18 168 lb 8 oz (76.4 kg)    Diabetic Foot Exam - Simple   No data filed     Lab Results  Component Value Date   WBC 9.0 07/14/2017   HGB 14.5 07/14/2017   HCT 42.3 07/14/2017   PLT 413 (H) 07/14/2017   GLUCOSE 104 (H) 05/05/2017   CHOL 218 (H) 05/05/2017   TRIG 107.0 05/05/2017   HDL 42.00 05/05/2017   LDLDIRECT 138.0 01/22/2016   LDLCALC 155 (H) 05/05/2017   ALT 28 05/05/2017   AST 22 05/05/2017   NA 146 (H) 05/05/2017   K 4.0 05/05/2017   CL 107 05/05/2017   CREATININE 0.90 05/05/2017   BUN 7 05/05/2017   CO2 26 05/05/2017   TSH 2.49 05/05/2017   HGBA1C 5.5 08/04/2017    Lab Results  Component Value Date   TSH 2.49 05/05/2017   Lab Results  Component Value Date   WBC 9.0 07/14/2017   HGB 14.5 07/14/2017   HCT 42.3 07/14/2017   MCV 89.8 07/14/2017   PLT 413 (H) 07/14/2017   Lab Results  Component Value Date   NA 146 (H) 05/05/2017   K 4.0 05/05/2017   CO2 26 05/05/2017  GLUCOSE 104 (H) 05/05/2017   BUN 7 05/05/2017   CREATININE 0.90 05/05/2017   BILITOT 0.4 05/05/2017   ALKPHOS 68 05/05/2017   AST 22 05/05/2017   ALT 28 05/05/2017   PROT 7.2 05/05/2017   ALBUMIN 4.8 05/05/2017   CALCIUM 10.2 05/05/2017   ANIONGAP 10 03/21/2017   GFR 82.98 05/05/2017   Lab Results  Component Value Date   CHOL 218 (H) 05/05/2017   Lab Results  Component Value Date   HDL 42.00 05/05/2017   Lab Results  Component Value Date   LDLCALC 155 (H) 05/05/2017   Lab Results  Component Value Date   TRIG 107.0  05/05/2017   Lab Results  Component Value Date   CHOLHDL 5 05/05/2017   Lab Results  Component Value Date   HGBA1C 5.5 08/04/2017       Assessment & Plan:   Problem List Items Addressed This Visit    Depression with anxiety    She notes she feels less anxious and her mind is racing less with the addition of Buspar bid she will increase to tid. She is given the phone number for urgent evaluation with [facility] and she is taken out of work for the next 30 days. She has an appointment with counselor in 2 weeks and is considering [place]. Spent 35 minutes in counseling with patient         I am having [name] maintain her albuterol, mometasone-formoterol, Levonorgestrel-Ethinyl Estradiol, valACYclovir, venlafaxine XR, ondansetron, busPIRone, montelukast, famotidine, lamoTRIgine, gabapentin, amphetamine-dextroamphetamine, amphetamine-dextroamphetamine, amphetamine-dextroamphetamine, omeprazole, and zolpidem.  No orders of the defined types were placed in this encounter.  I discussed the assessment and treatment plan with the patient. The patient was provided an opportunity to ask questions and all were answered. The patient agreed with the plan and demonstrated an understanding of the instructions.   The patient was advised to call back or seek an in-person evaluation if the symptoms worsen or if the condition fails to improve as anticipated.  I provided 35 minutes of non-face-to-face time during this encounter.   Penni Homans, MD

## 2018-07-20 ENCOUNTER — Encounter (HOSPITAL_COMMUNITY): Payer: Self-pay | Admitting: Psychiatry

## 2018-07-20 ENCOUNTER — Telehealth (HOSPITAL_COMMUNITY): Payer: Self-pay | Admitting: Psychiatry

## 2018-07-20 ENCOUNTER — Other Ambulatory Visit (HOSPITAL_COMMUNITY): Payer: 59 | Attending: Psychiatry | Admitting: Psychiatry

## 2018-07-20 ENCOUNTER — Other Ambulatory Visit: Payer: Self-pay

## 2018-07-20 DIAGNOSIS — N83209 Unspecified ovarian cyst, unspecified side: Secondary | ICD-10-CM | POA: Insufficient documentation

## 2018-07-20 DIAGNOSIS — F17210 Nicotine dependence, cigarettes, uncomplicated: Secondary | ICD-10-CM | POA: Insufficient documentation

## 2018-07-20 DIAGNOSIS — Z915 Personal history of self-harm: Secondary | ICD-10-CM | POA: Insufficient documentation

## 2018-07-20 DIAGNOSIS — E785 Hyperlipidemia, unspecified: Secondary | ICD-10-CM | POA: Insufficient documentation

## 2018-07-20 DIAGNOSIS — R7989 Other specified abnormal findings of blood chemistry: Secondary | ICD-10-CM | POA: Insufficient documentation

## 2018-07-20 DIAGNOSIS — F3162 Bipolar disorder, current episode mixed, moderate: Secondary | ICD-10-CM | POA: Insufficient documentation

## 2018-07-20 DIAGNOSIS — F4310 Post-traumatic stress disorder, unspecified: Secondary | ICD-10-CM | POA: Insufficient documentation

## 2018-07-20 DIAGNOSIS — Z793 Long term (current) use of hormonal contraceptives: Secondary | ICD-10-CM | POA: Insufficient documentation

## 2018-07-20 DIAGNOSIS — K219 Gastro-esophageal reflux disease without esophagitis: Secondary | ICD-10-CM | POA: Insufficient documentation
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
## 2018-07-21 ENCOUNTER — Other Ambulatory Visit: Payer: Self-pay

## 2018-07-21 ENCOUNTER — Other Ambulatory Visit (HOSPITAL_COMMUNITY): Payer: 59 | Admitting: Psychiatry

## 2018-07-21 ENCOUNTER — Encounter (HOSPITAL_COMMUNITY): Payer: Self-pay | Admitting: Psychiatry

## 2018-07-21 DIAGNOSIS — F3162 Bipolar disorder, current episode mixed, moderate: Secondary | ICD-10-CM

## 2018-07-21 DIAGNOSIS — R7989 Other specified abnormal findings of blood chemistry: Secondary | ICD-10-CM | POA: Diagnosis not present

## 2018-07-21 DIAGNOSIS — K219 Gastro-esophageal reflux disease without esophagitis: Secondary | ICD-10-CM | POA: Diagnosis not present

## 2018-07-21 DIAGNOSIS — F411 Generalized anxiety disorder: Secondary | ICD-10-CM

## 2018-07-21 DIAGNOSIS — F17210 Nicotine dependence, cigarettes, uncomplicated: Secondary | ICD-10-CM | POA: Diagnosis not present

## 2018-07-21 DIAGNOSIS — F4310 Post-traumatic stress disorder, unspecified: Secondary | ICD-10-CM | POA: Diagnosis not present

## 2018-07-21 DIAGNOSIS — E785 Hyperlipidemia, unspecified: Secondary | ICD-10-CM | POA: Diagnosis not present

## 2018-07-21 DIAGNOSIS — N83209 Unspecified ovarian cyst, unspecified side: Secondary | ICD-10-CM | POA: Diagnosis not present

## 2018-07-21 DIAGNOSIS — Z793 Long term (current) use of hormonal contraceptives: Secondary | ICD-10-CM | POA: Diagnosis not present

## 2018-07-21 DIAGNOSIS — Z915 Personal history of self-harm: Secondary | ICD-10-CM | POA: Diagnosis not present

## 2018-07-21 DIAGNOSIS — F901 Attention-deficit hyperactivity disorder, predominantly hyperactive type: Secondary | ICD-10-CM

## 2018-07-21 NOTE — Progress Notes (Signed)
Virtual Visit via Video Note  I connected with Kathryn [name] on 07/21/18 at  9:00 AM EDT by a video enabled telemedicine application and verified that I am speaking with the correct person using two identifiers.  Location: Patient: Kathryn [name] Provider: Lise Auer, LCSW   I discussed the limitations of evaluation and management by telemedicine and the availability of in person appointments. The patient expressed understanding and agreed to proceed.  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    Observations/Objective: Counselor checked in with all group members to determine current state and mood and introduced Kathryn [name] to the group. Kathryn [name] shared that she was glad to be joining the group. Counselor and group celebrated a group member who is graduating today from the program.  Counselor prompted the group members to share a goal they are hoping to achieve during group. Kathryn [name] stated that she would like to work on decreasing her anxiety and panic attacks. Counselor introduced the Field seismologist, Kathryn [name], [facility] who covered grief and loss issues. Kathryn [name] shared that she has complex and chronic grief related issues and that she would be interested in trying to write a letter to her dad, who she identified as the root of her grief. Counselor allowed group members to journal/self-reflect on the presentation and processed out loud their major takeaways. Counselor introduced a second Field seismologist who walked the group through two yoga practices. Kathryn [name] participated fully and shared that she felt restless and that her mind wandered a lot during the activity. We discussed that every one has different experiences and that it can be a skill to master over time.  Counselor summarized session and group members shared their intentions for the afternoon.   Assessment and Plan: Counselor recommends that Elijah remain in IOP  treatment to continue addressing mental health symptoms and treatment plan goals. It is recommended that Deboraha take medications as prescribed, apply skills learned in session, follow safety and crisis plan and communicate needs with support system.   Follow Up Instructions: Counselor will send link for tomorrow's group meeting via Webex.     I discussed the assessment and treatment plan with the patient. The patient was provided an opportunity to ask questions and all were answered. The patient agreed with the plan and demonstrated an understanding of the instructions.   The patient was advised to call back or seek an in-person evaluation if the symptoms worsen or if the condition fails to improve as anticipated.  I provided 180 minutes of non-face-to-face time during this encounter.   Lise Auer, LCSW

## 2018-07-22 ENCOUNTER — Other Ambulatory Visit: Payer: Self-pay

## 2018-07-22 ENCOUNTER — Other Ambulatory Visit (HOSPITAL_COMMUNITY): Payer: 59 | Admitting: Licensed Clinical Social Worker

## 2018-07-22 DIAGNOSIS — F3162 Bipolar disorder, current episode mixed, moderate: Secondary | ICD-10-CM | POA: Diagnosis not present

## 2018-07-22 DIAGNOSIS — F901 Attention-deficit hyperactivity disorder, predominantly hyperactive type: Secondary | ICD-10-CM
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
## 2018-07-25 ENCOUNTER — Encounter (HOSPITAL_COMMUNITY): Payer: Self-pay | Admitting: Psychiatry

## 2018-07-25 ENCOUNTER — Ambulatory Visit: Payer: 59 | Admitting: Psychology

## 2018-07-25 ENCOUNTER — Other Ambulatory Visit: Payer: Self-pay

## 2018-07-25 ENCOUNTER — Other Ambulatory Visit (HOSPITAL_COMMUNITY): Payer: 59 | Admitting: Psychiatry

## 2018-07-25 DIAGNOSIS — F411 Generalized anxiety disorder: Secondary | ICD-10-CM

## 2018-07-25 DIAGNOSIS — F3162 Bipolar disorder, current episode mixed, moderate: Secondary | ICD-10-CM | POA: Diagnosis not present

## 2018-07-25 DIAGNOSIS — Z0279 Encounter for issue of other medical certificate: Secondary | ICD-10-CM

## 2018-07-25 NOTE — Progress Notes (Signed)
Virtual Visit via Video Note  I connected with [name] on 07/25/18 at  9:00 AM EDT by a video enabled telemedicine application and verified that I am speaking with the correct person using two identifiers.  Location: Patient: Kathryn [name] Provider: Lise Auer, LCSW   I discussed the limitations of evaluation and management by telemedicine and the availability of in person appointments. The patient expressed understanding and agreed to proceed.  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    Observations/Objective: Counselor started group by asking participants to connect with themselves for a few moments to identify 3 things they were currently feeling. [name] was able to identify both positive and negative emotions and express her desires in group today. Counselor then engaged the group in spending time reflecting on what goals they are currently most focused on achieving in therapy. [name] shared that she needs more support around boundaries, better ways to cope, managing Panic Attacks, understanding her MH diagnosis and benefits of medication. Counselor took note of theses goals and will focus topics/treatment around her and other group members needs. Counselor then spent the remainder of group teaching cognitive coping skills. Group members gave examples of anxiety provoking situations, automatic negative thoughts and how they would apply the skills learned in session. All group members closed by sharing a self-care action plan for the remainder of the day. In session [name] reported having chronic passive suicidal ideations, with no intent, means or plan. We processed safety considerations in communicating about these within her support system and with professionals.   Assessment and Plan: Counselor recommends that Nimsi continue in IOP group treatment to continue work on treatment plan goals and to better manage mental  health symptoms.   Follow Up Instructions: Counselor will send Webex link for tomorrow's session for access to treatment.    I discussed the assessment and treatment plan with the patient. The patient was provided an opportunity to ask questions and all were answered. The patient agreed with the plan and demonstrated an understanding of the instructions.   The patient was advised to call back or seek an in-person evaluation if the symptoms worsen or if the condition fails to improve as anticipated.  I provided 180 minutes of non-face-to-face time during this encounter.   Lise Auer, LCSW

## 2018-07-26 ENCOUNTER — Other Ambulatory Visit (HOSPITAL_COMMUNITY): Payer: 59 | Admitting: Psychiatry

## 2018-07-26 ENCOUNTER — Other Ambulatory Visit: Payer: Self-pay

## 2018-07-26 ENCOUNTER — Telehealth (HOSPITAL_COMMUNITY): Payer: Self-pay | Admitting: Psychiatry

## 2018-07-27 ENCOUNTER — Encounter (HOSPITAL_COMMUNITY): Payer: Self-pay

## 2018-07-27 ENCOUNTER — Other Ambulatory Visit (HOSPITAL_COMMUNITY): Payer: 59 | Admitting: Psychiatry

## 2018-07-27 ENCOUNTER — Other Ambulatory Visit: Payer: Self-pay

## 2018-07-27 DIAGNOSIS — F3162 Bipolar disorder, current episode mixed, moderate: Secondary | ICD-10-CM

## 2018-07-27 DIAGNOSIS — F411 Generalized anxiety disorder: Secondary | ICD-10-CM

## 2018-07-27 NOTE — Progress Notes (Signed)
Virtual Visit via Video Note  I connected with Kathryn [name] on 07/27/18 at  9:00 AM EDT by a video enabled telemedicine application and verified that I am speaking with the correct person using two identifiers.  Location: Patient: Kathryn [name] Provider: Lise Auer, LCSW   I discussed the limitations of evaluation and management by telemedicine and the availability of in person appointments. The patient expressed understanding and agreed to proceed.  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    Observations/Objective: Counselor and Case Manager to check in with all group members to determine needs, share discharge dates, give paperwork reminders and to assess current mood and functioning. Jonika reported having a plan to drop off paperwork and that she has had an up and down week mentally and emotionally.  Counselor then introduced [employer], [place] for [employer], as he presents on Grief and Loss issues. Makinley processed losses with Mikki Santee and identified areas of fear that she has in allowing herself to get involved with or love others, due to fear of loosing them. Counselor explored fears that come with Grief and Loss after Mikki Santee left the meeting and allowed group members to reflect thoughts and feelings on paper. Counselor introduced the practice and benefits of Laughter Yoga to the group. All group members participated and shared feedback about their experience. Eldana said she had fun experiencing the practice and that she thinks its a great coping skill to utilize in the future. Counselor allowed all group members to share take-aways from today's session and to identify a self-care action step they can take today. Regan reported wanting to be productive today in running errands to relieve self or some stress.   Assessment and Plan: Counselor recommends that Neil remain in IOP group treatment to learn to better manage  mental health symptoms and to continue addressing treatment plan goals. Counselor recommends that medication continue as prescribed, safety plan be followed and that she find ways to incorporated skills learned in treatment to her everyday life situations.   Follow Up Instructions: Counselor will send Webex link for tomorrow's session.     I discussed the assessment and treatment plan with the patient. The patient was provided an opportunity to ask questions and all were answered. The patient agreed with the plan and demonstrated an understanding of the instructions.   The patient was advised to call back or seek an in-person evaluation if the symptoms worsen or if the condition fails to improve as anticipated.  I provided 180 minutes of non-face-to-face time during this encounter.   Lise Auer, LCSW

## 2018-07-28 ENCOUNTER — Encounter: Payer: Self-pay | Admitting: Family Medicine

## 2018-07-28 ENCOUNTER — Encounter (HOSPITAL_COMMUNITY): Payer: Self-pay | Admitting: Psychiatry

## 2018-07-28 ENCOUNTER — Other Ambulatory Visit (HOSPITAL_COMMUNITY): Payer: 59 | Admitting: Psychiatry

## 2018-07-28 ENCOUNTER — Other Ambulatory Visit: Payer: Self-pay

## 2018-07-28 DIAGNOSIS — F3162 Bipolar disorder, current episode mixed, moderate: Secondary | ICD-10-CM

## 2018-07-28 DIAGNOSIS — E785 Hyperlipidemia, unspecified: Secondary | ICD-10-CM

## 2018-07-28 DIAGNOSIS — I10 Essential (primary) hypertension: Secondary | ICD-10-CM

## 2018-07-28 DIAGNOSIS — F411 Generalized anxiety disorder: Secondary | ICD-10-CM

## 2018-07-28 NOTE — Progress Notes (Signed)
Virtual Visit via Video Note  I connected with Kathryn [name] on 07/28/18 at  9:00 AM EDT by a video enabled telemedicine application and verified that I am speaking with the correct person using two identifiers.  Location: Patient: Kathryn [name] Provider: Lise Auer, LCSW   I discussed the limitations of evaluation and management by telemedicine and the availability of in person appointments. The patient expressed understanding and agreed to proceed.  History of Present Illness: Bipolar disorder and GAD due to adverse life experiences and work related issues.    Observations/Objective: Counselor and Case Manager greeted all IOP participants and followed up on auths and paperwork. Counselor introduced a new member to the group and did a brief check in with all members to gauge their state of being. Counselor then shared a video a group member made for the group, inspired by yesterday's laughter yoga.  Counselor engaged the group in a [employer], sharing mental health resources for all to have to increase their support in managing their mental health. Kathryn [name] admitted that she did not have resources to share, but that she was so thankful to learn of some of the resources presented. She plans to contact them to initiate services. Counselor then walked the group through creating their own safety and crisis plan. Kathryn [name] shared information she included on her plan and connected with others experiences as they shared as well. Counselor encouraged group members to share the plan with important people in their lives for added support. Kathryn [name] noted that she would like to share hers with her mother. Counselor praised all for their hard work in group and encouraged self-care.   Assessment and Plan: Counselor recommends that [employer] engaged in IOP treatment. She needs to continue taking medications as prescribed, following doctors orders, and crisis plan, utilizing coping skills to manage MH symptoms.    Follow Up Instructions: Counselor will send link for Webex to join IOP group tomorrow.     I discussed the assessment and treatment plan with the patient. The patient was provided an opportunity to ask questions and all were answered. The patient agreed with the plan and demonstrated an understanding of the instructions.   The patient was advised to call back or seek an in-person evaluation if the symptoms worsen or if the condition fails to improve as anticipated.  I provided 180 minutes of non-face-to-face time during this encounter.   Lise Auer, LCSW

## 2018-07-29 ENCOUNTER — Other Ambulatory Visit: Payer: Self-pay

## 2018-07-29 ENCOUNTER — Other Ambulatory Visit (HOSPITAL_COMMUNITY): Payer: 59 | Admitting: Psychiatry

## 2018-08-01 ENCOUNTER — Other Ambulatory Visit (HOSPITAL_COMMUNITY): Payer: 59 | Admitting: Psychiatry

## 2018-08-01 ENCOUNTER — Other Ambulatory Visit: Payer: Self-pay

## 2018-08-01 DIAGNOSIS — F3162 Bipolar disorder, current episode mixed, moderate: Secondary | ICD-10-CM

## 2018-08-01 DIAGNOSIS — F411 Generalized anxiety disorder: Secondary | ICD-10-CM

## 2018-08-02 ENCOUNTER — Encounter (HOSPITAL_COMMUNITY): Payer: Self-pay | Admitting: Psychiatry

## 2018-08-02 ENCOUNTER — Other Ambulatory Visit: Payer: Self-pay

## 2018-08-02 ENCOUNTER — Other Ambulatory Visit (HOSPITAL_COMMUNITY): Payer: 59 | Admitting: Licensed Clinical Social Worker

## 2018-08-02 DIAGNOSIS — F3162 Bipolar disorder, current episode mixed, moderate: Secondary | ICD-10-CM | POA: Diagnosis not present

## 2018-08-02 NOTE — Progress Notes (Signed)
Virtual Visit via Telephone Note  I connected with Kathryn [name] on 08/03/18 at  9:00 AM EDT by telephone and verified that I am speaking with the correct person using two identifiers.   I discussed the limitations, risks, security and privacy concerns of performing an evaluation and management service by telephone and the availability of in person appointments. I also discussed with the patient that there may be a patient responsible charge related to this service. The patient expressed understanding and agreed to proceed.    I discussed the assessment and treatment plan with the patient. The patient was provided an opportunity to ask questions and all were answered. The patient agreed with the plan and demonstrated an understanding of the instructions.   The patient was advised to call back or seek an in-person evaluation if the symptoms worsen or if the condition fails to improve as anticipated.  I provided 15 minutes of non-face-to-face time during this encounter.   Derrill Center, NP   [facility] MD/PA/NP OP Progress Note  08/03/2018 8:02 AM Kathryn [name]  MRN:  [id number]   Denita " I need something before he gets too bad."   Evaluation: Called regarding worsening mood irritability.  Stated " I did not want to attend group until I spoke to someone regarding my medications."  Was reported patient attended pharmaceutical group on 6/16.  And is now requesting to be started on antipsychotic medication.  Patient reported after speaking with mother and sister " I think maybe I am on too many medications."   Patient reports she is going to discontinue her medications. Patient presents anxious, irritable, tearful with reports symptoms of worry.   [place] was requesting validation to stop all medications.  Patient was provided with education to follow-up with primary attending psychiatrist and proper titration and transition of medications.  Broke discontinued phone call abruptly.  NP contacted patient and  reiterated proper titration and address additional concerns.  Patient appeared receptive.  [place] attended and remaining group session.  Patient appears to needs constant reassurance.  Consider titration with Lamictal. Np to will follow-up on 6/19.  Support, encouragement and reassurance was provided.    Visit Diagnosis:    ICD-10-CM   1. Bipolar 1 disorder, mixed, moderate (HCC)  F31.62     Past Psychiatric History:   Past Medical History:  Past Medical History:  Diagnosis Date  . Abnormal liver function tests 02/02/2016  . ADHD 06/10/2016  . Alcohol use 02/23/2016  . Allergy   . Anxiety   . Arm fracture, right    as child, fx in three places, no surgery  . Depression   . Depression with anxiety 2014-05-06   PTSD s/p death of best friend   . Essential hypertension 02/02/2016  . GERD (gastroesophageal reflux disease)   . 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 Headache 04/09/2016  . Hyperlipidemia, mixed 02/02/2016  . Migraine   . Ovarian cyst   . PCOS (polycystic ovarian syndrome)   . Preventative health care 10/27/2015  . Thrombocytosis ([place]) 04/09/2016    Past Surgical History:  Procedure Laterality Date  . ADENOIDECTOMY  2002  . TYMPANOSTOMY TUBE PLACEMENT Bilateral 1999    Family Psychiatric History:   Family History:  Family History  Problem Relation Age of Onset  . Hypertension Mother   . Heart disease Mother        CAD, stent at 8, s/p cardiac arrest with Defib  . Hyperlipidemia Mother   . Diabetes Mother  s/p gastric bypass  . Obesity Mother        s/p gastric bypass  . OCD Mother   . Hyperlipidemia Father   . Hypertension Father   . Anxiety disorder Father   . Asthma Sister   . Hyperlipidemia Brother   . Anxiety disorder Brother   . Stroke Maternal Grandmother   . Thyroid disease Maternal Grandmother   . Anxiety disorder Maternal Grandmother   . Heart disease Maternal Grandfather        MI first at 80, s/p stents and CABG  . Hyperlipidemia Maternal  Grandfather   . Hypertension Maternal Grandfather   . Stroke Maternal Grandfather   . Cancer Maternal Grandfather        melanoma  . Aneurysm Maternal Grandfather        brain  . Kidney disease Maternal Grandfather        tumor  . Heart disease Paternal Grandmother   . Cancer Paternal Lucilla Edin and brain  . Heart disease Maternal Uncle   . Vision loss Maternal Uncle   . Anxiety disorder Maternal Uncle   . Anxiety disorder Maternal Aunt   . Depression Maternal Aunt     Social History:  Social History   Socioeconomic History  . Marital status: Single    Spouse name: Not on file  . Number of children: 0  . Years of education: 37  . Highest education level: Not on file  Occupational History    Comment: [employer]  Social Needs  . Financial resource strain: Not on file  . Food insecurity    Worry: Not on file    Inability: Not on file  . Transportation needs    Medical: Not on file    Non-medical: Not on file  Tobacco Use  . Smoking status: Heavy Tobacco Smoker    Packs/day: 1.50    Types: Cigarettes  . Smokeless tobacco: Current User    Types: Snuff  Substance and Sexual Activity  . Alcohol use: No    Alcohol/week: 0.0 standard drinks    Comment: maybe every 6 months  . Drug use: Yes    Types: Marijuana    Comment: "once every six months"  . Sexual activity: Yes    Birth control/protection: Pill    Comment: lives with partner, works for [employer], no diewtary restrictions.   Lifestyle  . Physical activity    Days per week: Not on file    Minutes per session: Not on file  . Stress: Not on file  Relationships  . Social Herbalist on phone: Not on file    Gets together: Not on file    Attends religious service: Not on file    Active member of club or organization: Not on file    Attends meetings of clubs or organizations: Not on file    Relationship status: Not on file  Other Topics Concern  . Not on file  Social History  Narrative   Lives with girlfriend's family   Caffeine- soda and tea, 6 cans daily    Allergies:  Allergies  Allergen Reactions  . Oxycodone Nausea Only    Oxycontin also  . Seroquel [Quetiapine Fumarate]     Mental status changes    Metabolic Disorder Labs: Lab Results  Component Value Date   HGBA1C 5.5 08/04/2017   No results found for: PROLACTIN Lab Results  Component Value Date   CHOL 218 (H)  05/05/2017   TRIG 107.0 05/05/2017   HDL 42.00 05/05/2017   CHOLHDL 5 05/05/2017   VLDL 21.4 05/05/2017   LDLCALC 155 (H) 05/05/2017   Lab Results  Component Value Date   TSH 2.49 05/05/2017   TSH 1.89 01/22/2016    Therapeutic Level Labs: No results found for: LITHIUM No results found for: VALPROATE No components found for:  CBMZ  Current Medications: Current Outpatient Medications  Medication Sig Dispense Refill  . albuterol (PROVENTIL HFA;VENTOLIN HFA) 108 (90 Base) MCG/ACT inhaler Inhale 2 puffs into the lungs every 6 (six) hours as needed for wheezing or shortness of breath. 3 Inhaler 0  . amphetamine-dextroamphetamine (ADDERALL) 10 MG tablet Take 1 tablet (10 mg total) by mouth 2 (two) times daily with a meal. August 2020 60 tablet 0  . amphetamine-dextroamphetamine (ADDERALL) 10 MG tablet Take 1 tablet (10 mg total) by mouth 2 (two) times daily with a meal. July 2020 60 tablet 0  . amphetamine-dextroamphetamine (ADDERALL) 10 MG tablet Take 1 tablet (10 mg total) by mouth 2 (two) times daily with a meal. June 2020 60 tablet 0  . busPIRone (BUSPAR) 10 MG tablet Take 1 tablet (10 mg total) by mouth 3 (three) times daily. 90 tablet 1  . famotidine (PEPCID) 20 MG tablet Take 1 tablet (20 mg total) by mouth 2 (two) times daily. 180 tablet 0  . gabapentin (NEURONTIN) 300 MG capsule Take 1 capsule (300 mg total) by mouth 3 (three) times daily. 270 capsule 3  . lamoTRIgine (LAMICTAL) 100 MG tablet Take 1 tablet (100 mg total) by mouth daily. Take one a day 30 tablet 1  .  Levonorgestrel-Ethinyl Estradiol (AMETHIA,CAMRESE) 0.15-0.03 &0.01 MG tablet TAKE 1 TABLET BY MOUTH EVERY DAY 91 tablet 1  . mometasone-formoterol (DULERA) 100-5 MCG/ACT AERO Inhale 2 puffs into the lungs 2 (two) times daily. 3 Inhaler 1  . montelukast (SINGULAIR) 10 MG tablet Take 1 tablet (10 mg total) by mouth at bedtime. 90 tablet 1  . omeprazole (PRILOSEC) 40 MG capsule Take 1 capsule (40 mg total) by mouth 2 (two) times daily. 180 capsule 3  . ondansetron (ZOFRAN) 4 MG tablet Take 1 tablet (4 mg total) by mouth every 8 (eight) hours as needed for nausea or vomiting. 20 tablet 0  . valACYclovir (VALTREX) 1000 MG tablet Take 1 tablet (1,000 mg total) by mouth 2 (two) times daily. 60 tablet 5  . venlafaxine XR (EFFEXOR-XR) 75 MG 24 hr capsule Take 1 capsule (75 mg total) by mouth daily with breakfast. 90 capsule 1  . zolpidem (AMBIEN) 5 MG tablet Take 0.5-1 tablets (2.5-5 mg total) by mouth at bedtime as needed for sleep. 30 tablet 1   No current facility-administered medications for this visit.      Musculoskeletal:   Psychiatric Specialty Exam: ROS  There were no vitals taken for this visit.There is no height or weight on file to calculate BMI.  General Appearance: NA  Eye Contact:  NA  Speech:  Clear and Coherent  Volume:  Normal  Mood:  Irritable  Affect:  Depressed and Labile  Thought Process:  Coherent and Linear  Orientation:  Full (Time, Place, and Person)  Thought Content: Rumination   Suicidal Thoughts:  No  Homicidal Thoughts:  No  Memory:  Immediate;   Fair  Judgement:  Fair  Insight:  Lacking  Psychomotor Activity:  Normal  Concentration:  Concentration: Fair  Recall:  [employer] of Knowledge: Fair  Language: Fair  Akathisia:  No  Handed:  Right  AIMS (if indicated):  Assets:  Communication Skills Desire for Improvement Financial Resources/Insurance Social Support Talents/Skills  ADL's:  Intact  Cognition: WNL  Sleep:  Fair   Screenings: PHQ2-9      Office Visit from 08/22/2015 in [facility] at [employer]  PHQ-2 Total Score  5  PHQ-9 Total Score  19       Assessment and Plan:  Continue intensive outpatient program ( IOP) Continue medication as directed  Treatment plan was reviewed and agreed upon by NP T. Yana Schorr inpatient [facility] need for continued group services    Derrill Center, NP 08/03/2018, 8:02 AM

## 2018-08-02 NOTE — Progress Notes (Signed)
Virtual Visit via Video Note  I connected with Kathryn [name] on 07/22/2018 at  9:00 AM EDT by a video enabled telemedicine application and verified that I am speaking with the correct person using two identifiers.   I discussed the limitations of evaluation and management by telemedicine and the availability of in person appointments. The patient expressed understanding and agreed to proceed.   I discussed the assessment and treatment plan with the patient. The patient was provided an opportunity to ask questions and all were answered. The patient agreed with the plan and demonstrated an understanding of the instructions.   The patient was advised to call back or seek an in-person evaluation if the symptoms worsen or if the condition fails to improve as anticipated.  I provided 180 minutes of non-face-to-face time during this encounter.   Olegario Messier, LCSW     Daily Group Progress Note  Program: IOP  Group Time: 9am-12pm  Participation Level: Active  Behavioral Response: Appropriate and Sharing  Type of Therapy:  Group Therapy; process group, psycho-educational group  Summary of Progress:  THe purpose of this group is to utilize CBT and DBT skills in process and psycho-educational groups to increase use of healthy coping skills and decrease frequency and intensity of active mental health symptoms.  9am-10:30am Clinician checked in with group members, assessing for SI/HI/psychosis and overall level of functioning. Clinician provided clients time to process recent stressors and effects on mental health symptoms 10:30am-12pm Clinician and group members reviewed types of boundaries. Clinician and group members discussed ways to identify, implement, and maintain healthy boundaries with self and others. Clinician and group members role played sharing boundaries with others. Client shared what she has previously learned about creating boundaries in therapy however acknowledged that she  has not started setting boundaries until recently.   Olegario Messier, LCSW

## 2018-08-02 NOTE — Progress Notes (Signed)
Virtual Visit via Video Note  I connected with Kathryn [name] on 08/02/18 at  9:00 AM EDT by a video enabled telemedicine application and verified that I am speaking with the correct person using two identifiers.  Location: Patient: Kathryn [name] Provider: Lise Auer, LCSW   I discussed the limitations of evaluation and management by telemedicine and the availability of in person appointments. The patient expressed understanding and agreed to proceed.  History of Present Illness: Bipolar 1 and GAD.   Observations/Objective: Counselor and case manager checked in with all participants to determine needs and gauge mood. Counselor introduced a new group member and Kathryn [name] made her feel welcome. Counselor introduced a Agricultural consultant, Einar Grad, Pharmacist who shared about Psych Medications and Medication related topics. Kathryn [name] asked questions and gathered information from the speaker related to her current medications. Counselor allowed group members to process the information from the speaker and share their [place]. Counselor engaged the group in an activity where they did a body scan, documenting any issues within their bodies, as well as writing down specific concerns and questions they have their providers to prepare them for upcoming appointments. Counselor shared how to advocate for themselves when communicating with professionals. Counselor then shared psychoeducation with the group regarding Mental Health Diagnosing and DSM criteria in assessing mental health. Kathryn [name] participated well in the session, sharing feedback and thoughtful responses.  Assessment and Plan: Counselor recommends that Kathryn [name] remain in IOP treatment to address mental health symptoms and treatment goals. Counselor recommends that she continue to follow recommendations from medical professionals, apply skills learned in sessions and follow safety/crisis plan.   Follow Up Instructions: Counselor will send Webex link for  next session.   I provided 180 minutes of non-face-to-face time during this encounter.   Lise Auer, LCSW

## 2018-08-02 NOTE — Progress Notes (Signed)
  Virtual Visit via Video Note  I connected with Kathryn [name] on 08/02/18 at  9:00 AM EDT by a video enabled telemedicine application and verified that I am speaking with the correct person using two identifiers.   I discussed the limitations of evaluation and management by telemedicine and the availability of in person appointments. The patient expressed understanding and agreed to proceed.    I discussed the assessment and treatment plan with the patient. The patient was provided an opportunity to ask questions and all were answered. The patient agreed with the plan and demonstrated an understanding of the instructions.   The patient was advised to call back or seek an in-person evaluation if the symptoms worsen or if the condition fails to improve as anticipated.  I provided 180 minutes of non-face-to-face time during this encounter.   Olegario Messier, LCSW   Daily Group Progress Note  Program: IOP  Group Time: 9am-12pm  Participation Level: Active  Behavioral Response: Appropriate  Type of Therapy:  Group Therapy; process group, psycho-educational group  Summary of Progress:  The purpose of the group is to utilize CBT and DBT skills in a group setting to increase use of healthy coping skills and decrease frequency and intensity of active mental health symptoms.  9am-10:30am Clinician and group members checked in. Clinician provided clients with opportunity to process recent stressors and effect on mental health symptoms. Clinician inquired about any skills attempted and their effectiveness. Clinician and group members reviewed crisis resources and provided supportive feedback for those previously struggling to seek help despite SI. 10:30am-12pm Clinician presented the skill of Socratic Questioning to challenge ruminating thoughts and excessive worrying. Clinician provided clients with STOPP skill to pause, observe thoughts/feelings, challenge thoughts with facts, and do the next  right thing for them in the moment, which could including a breathing skill or progressive muscle relaxation, meditation, or problem solving. Client checked in late due to meeting with NP, see note for additional details. Client shared having SI the previous night and going to stay with a friend. Client shared difficulty being honest with herself about severity of suicidal thoughts. Client reports though she does know some coping skills from therapy and is able to share them with others, she often does not implement them herself. Client notes preferring instant gratification with cannabis use compared to taking time to use a skill.  Olegario Messier, LCSW

## 2018-08-03 ENCOUNTER — Other Ambulatory Visit: Payer: Self-pay

## 2018-08-03 ENCOUNTER — Encounter (HOSPITAL_COMMUNITY): Payer: Self-pay | Admitting: Licensed Clinical Social Worker

## 2018-08-03 ENCOUNTER — Other Ambulatory Visit (HOSPITAL_COMMUNITY): Payer: 59 | Admitting: Psychiatry

## 2018-08-03 DIAGNOSIS — F3162 Bipolar disorder, current episode mixed, moderate: Secondary | ICD-10-CM

## 2018-08-03 DIAGNOSIS — F411 Generalized anxiety disorder: Secondary | ICD-10-CM

## 2018-08-03 NOTE — Progress Notes (Signed)
Virtual Visit via Video Note  I connected with Kathryn [name] on 08/03/18 at  9:00 AM EDT by a video enabled telemedicine application and verified that I am speaking with the correct person using two identifiers.  Location: Patient: Kathryn [name] Provider: Lise Auer, LCSW   I discussed the limitations of evaluation and management by telemedicine and the availability of in person appointments. The patient expressed understanding and agreed to proceed.  History of Present Illness: GAD and Bipolar 1 Disorder   Observations/Objective: Case Manager checked in with all group members to share discharge dates, follow up with questions and concerns and discuss paperwork needs. Counselor introduced new group members and engaged all participants in a "ice breaker" activity to promote comfort within the group. Kathryn [name] participated well in the activity and welcomed the new group members. Counselor provided psychoeducation on the benefits of practicing guided imagery. Counselor shared a visual guided imagery and later had the group choose a specific guided imagery that was read aloud as they participated in the prompts. Kathryn [name] shared that she enjoyed both and felt more successful in this activity in the past with allowing herself to stay present and focused, with few distractions. She reported feeling motivated and reenergized around combating procrastination. Counselor introduced the guest speaker, Frederich Balding from the [employer], who shared about Self-Care and Overall Wellness. All group members participated in the discussion and identified ways they could applying the ideas and activities to their daily lives. Kathryn [name] jumped in several times and asked specific questions to better improve her understanding, taking ownership of her recovery.   Assessment and Plan: Counselor recommend that Kathryn [name] continue in IOP treatment to address treatment plan goals and better manage mental health symptoms. Counselor  advised to continue medication management as prescribed, follow medical professionals orders and follow crisis/safety plan.   Follow Up Instructions: Counselor will send the Webex link for tomorrow's session.    The patient was advised to call back or seek an in-person evaluation if the symptoms worsen or if the condition fails to improve as anticipated.  I provided 180 minutes of non-face-to-face time during this encounter.   Lise Auer, LCSW

## 2018-08-03 NOTE — Progress Notes (Signed)
Kathryn [name] is a 23 y.o. female, who has been in [place] since 07-20-18; treatment for ongoing anxiety and depressive symptoms.  Pt requested that writer contact her HR representative to get her an extension from work (beginning 08-09-18).  Placed call to Christiana Fuchs @ [phone] ext [phone] to inquire about pt's short term disability.  According to [facility] is not aware of a claim for pt because PCP office has yet to send her any disability forms.  Cecille Rubin requested that pt contact PCP and inform them to send the forms over immediately.  A:  Placed call to pt to inform her about the conversation with Cecille Rubin.  Pt states she had spoken to [place] last week and wasn't informed that she didn't have the forms.  Pt states she will contact her today.  Inform Ricky Ala, NP.        Carlis Abbott, RITA, M.Ed, CNA

## 2018-08-04 ENCOUNTER — Other Ambulatory Visit (HOSPITAL_COMMUNITY): Payer: 59 | Admitting: Psychiatry

## 2018-08-04 ENCOUNTER — Encounter (HOSPITAL_COMMUNITY): Payer: Self-pay

## 2018-08-04 ENCOUNTER — Other Ambulatory Visit: Payer: Self-pay

## 2018-08-04 ENCOUNTER — Telehealth: Payer: Self-pay | Admitting: Family Medicine

## 2018-08-04 DIAGNOSIS — F411 Generalized anxiety disorder: Secondary | ICD-10-CM

## 2018-08-04 DIAGNOSIS — F3162 Bipolar disorder, current episode mixed, moderate: Secondary | ICD-10-CM

## 2018-08-04 NOTE — Progress Notes (Addendum)
Virtual Visit via Video Note  I connected with Kathryn [name] on 08/04/18 at  9:00 AM EDT by a video enabled telemedicine application and verified that I am speaking with the correct person using two identifiers.  Location: Patient: Kathryn [name] Provider: Lise Auer, LCSW   I discussed the limitations of evaluation and management by telemedicine and the availability of in person appointments. The patient expressed understanding and agreed to proceed.  History of Present Illness: Biploar 1 and GAD    Observations/Objective: Case Manager checked in with all participants to review discharge dates, insurance authorizations, work-related documents and needs for the treatment team. Counselor engaged the group in an therapeutic activity of making Gratitude Statements and shared the benefits of this practice. Kathryn [name] reported several things she is grateful for and we were able to identify her significant progress in the past week. Counselor introduced guest speaker, Jeanella Craze, Lead [employer], to facilitate a discussion around Grief and Loss topics. Kathryn [name] shared thoughts and feelings out loud with Mikki Santee and the group connected with childhood losses. Counselor prompted the group to journal about the G&L issues they would like to further process with their individual therapist. Counselor provided the group with 25 coping skills in better managing anxiety. Counselor introduced Field seismologist, [name], Yoga Instructor, to guide the group in a yoga practice. Kathryn [name] reported that she was "actually focused more than in the past" and that she was proud of this progress. Counselor checked in with all participants to assess the benefits. Counselor closed by having each share one coping skill they would like to apply over the next week. She would like to write a letter to her present self and continue with guided imagery.   Assessment and Plan: Counselor recommends that Kathryn [name] remains in IOP treatment to  better manage mental health symptoms and continue to address treatment plan goals. Counselor recommends adherence to crisis/safety plan, taking medications as prescribed and following up with medical professionals if any issues arise.   Follow Up Instructions: Counselor will send Webex link for next session.    I discussed the assessment and treatment plan with the patient. The patient was provided an opportunity to ask questions and all were answered. The patient agreed with the plan and demonstrated an understanding of the instructions.   The patient was advised to call back or seek an in-person evaluation if the symptoms worsen or if the condition fails to improve as anticipated.  I provided 180 minutes of non-face-to-face time during this encounter.   Lise Auer, LCSW

## 2018-08-04 NOTE — Telephone Encounter (Signed)
Received FMLA forms for patient. PCP completed them and signed I have faxed them to [phone] with a fax conformation. Copy sent to scan, original in MA's blue folder listed as "Copies of Completed FMLA Forms" [place], [place]

## 2018-08-05 ENCOUNTER — Other Ambulatory Visit (HOSPITAL_COMMUNITY): Payer: 59 | Admitting: Psychiatry

## 2018-08-05 ENCOUNTER — Other Ambulatory Visit: Payer: Self-pay

## 2018-08-05 ENCOUNTER — Telehealth: Payer: Self-pay

## 2018-08-05 NOTE — Telephone Encounter (Signed)
Spoke with patient about paperwork

## 2018-08-05 NOTE — Telephone Encounter (Signed)
Copied from [place] [id number]. Topic: General - Inquiry >> Aug 03, 2018  2:10 PM Virl Axe D wrote: Reason for CRM: Pt called to speak with [facility] regarding paperwork for [facility] and short term disability that Dr. Charlett Blake was supposed to be filling out and returning. Requesting callback.

## 2018-08-08 ENCOUNTER — Other Ambulatory Visit: Payer: Self-pay

## 2018-08-08 ENCOUNTER — Other Ambulatory Visit (HOSPITAL_COMMUNITY): Payer: 59 | Admitting: Psychiatry

## 2018-08-08 ENCOUNTER — Ambulatory Visit: Payer: Managed Care, Other (non HMO) | Admitting: Family Medicine

## 2018-08-08 ENCOUNTER — Encounter (HOSPITAL_COMMUNITY): Payer: Self-pay

## 2018-08-08 DIAGNOSIS — F418 Other specified anxiety disorders: Secondary | ICD-10-CM

## 2018-08-08 DIAGNOSIS — F3162 Bipolar disorder, current episode mixed, moderate: Secondary | ICD-10-CM | POA: Diagnosis not present

## 2018-08-08 NOTE — Progress Notes (Signed)
Virtual Visit via Video Note  I connected with Kathryn [name] on 08/08/18 at  9:00 AM EDT by a video enabled telemedicine application and verified that I am speaking with the correct person using two identifiers.  Location: Patient: Kathryn [name] Provider: Lise Auer, LCSW  History of Present Illness: Depression with Anxiety   Observations/Objective: Case Manager checked in with all participants to review discharge dates, insurance authorizations, work-related documents and needs for the treatment team. Counselor prompted all group members to share how they applied Anxiety Management Skills learned on Friday over the weekend. Kathryn [name] celebrated a self-achievement of being able to spend time alone with herself and stayed mindful and grounded. She has reported that this is a very difficult task for her in the past. Each member shared very intentional and inspirational ways they applied the skills and saw positive results. Counselor then shared a TedTalk by Almond Lint on Shame and Vulnerability. Kathryn [name] shared several quotes from her that spoke to her and her situation. Counselor allowed space for group members to share their [place]. Counselor walked the group through creating ECOMAPs about their current relationships and connections. Each group member shared what they created and how it is impacting their mental health and overall wellness. Kathryn [name] identified that she has multilayers of impact in all of her relationships and that she sees how they all impact her mental health.  Counselor spent the last 10 minutes allowing all to share encouraging words and inspirations gathered from one of the group members who graduates today. The graduate was touched and Kathryn [name] was able to share memories from her growth and experience together in the group.    Assessment and Plan: Counselor recommends that Kathryn [name] remains in IOP treatment to better manage mental health symptoms and continue to address treatment plan  goals. Counselor recommends adherence to crisis/safety plan, taking medications as prescribed and following up with medical professionals if any issues arise.   Follow Up Instructions: Counselor will send Webex link for next session.    I discussed the assessment and treatment plan with the patient. The patient was provided an opportunity to ask questions and all were answered. The patient agreed with the plan and demonstrated an understanding of the instructions.   The patient was advised to call back or seek an in-person evaluation if the symptoms worsen or if the condition fails to improve as anticipated.  I provided 180 minutes of non-face-to-face time during this encounter.   Lise Auer, LCSW

## 2018-08-09 ENCOUNTER — Other Ambulatory Visit (HOSPITAL_COMMUNITY): Payer: 59 | Admitting: Licensed Clinical Social Worker

## 2018-08-09 ENCOUNTER — Encounter (HOSPITAL_COMMUNITY): Payer: Self-pay

## 2018-08-09 ENCOUNTER — Other Ambulatory Visit: Payer: Self-pay

## 2018-08-09 DIAGNOSIS — F3162 Bipolar disorder, current episode mixed, moderate: Secondary | ICD-10-CM

## 2018-08-09 DIAGNOSIS — F418 Other specified anxiety disorders: Secondary | ICD-10-CM

## 2018-08-09 MED ORDER — LAMOTRIGINE 100 MG PO TABS: 100.0000 mg | ORAL_TABLET | Freq: Every day | ORAL | 0 refills | Status: DC
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
## 2018-08-09 NOTE — Progress Notes (Signed)
Lamictal was refilled as requested for 90 days. Reported taken as directed and tolerating medications well.

## 2018-08-10 ENCOUNTER — Other Ambulatory Visit: Payer: Self-pay

## 2018-08-10 ENCOUNTER — Other Ambulatory Visit (HOSPITAL_COMMUNITY): Payer: 59 | Admitting: Psychiatry

## 2018-08-10 DIAGNOSIS — F418 Other specified anxiety disorders: Secondary | ICD-10-CM

## 2018-08-10 DIAGNOSIS — F411 Generalized anxiety disorder: Secondary | ICD-10-CM

## 2018-08-10 DIAGNOSIS — F3162 Bipolar disorder, current episode mixed, moderate: Secondary | ICD-10-CM | POA: Diagnosis not present

## 2018-08-10 NOTE — Progress Notes (Signed)
Virtual Visit via Telephone Note  I connected with [name] on 08/10/18 at  9:00 AM EDT by telephone and verified that I am speaking with the correct person using two identifiers.   I discussed the limitations, risks, security and privacy concerns of performing an evaluation and management service by telephone and the availability of in person appointments. I also discussed with the patient that there may be a patient responsible charge related to this service. The patient expressed understanding and agreed to proceed.    I discussed the assessment and treatment plan with the patient. The patient was provided an opportunity to ask questions and all were answered. The patient agreed with the plan and demonstrated an understanding of the instructions.   The patient was advised to call back or seek an in-person evaluation if the symptoms worsen or if the condition fails to improve as anticipated.  I provided 15 minutes of non-face-to-face time during this encounter.   Derrill Center, NP    [facility] Intensive Outpatient Program Discharge Summary  Kathryn [name] [id number]  Admission date: 07/23/2018 Discharge date: 08/12/2018  Reason for admission:Per admission assessment note:  Kathryn [name] 72 year Caucasian female presents with worsening depression and anxiety.  Reports "mental breakdown"approximately 1 week ago.  Reports racing thoughts, worsening anxiety to include panic attacks.  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 States she became overwhelm at work and started crying for unknown reasons. Reports diagnoses of bipolar, PTSD and depression.  Reports she is currently followed by therapist and a psychiatrist. Reported a recent medication adjustment. States she is prescribed Lamictal, Effexor and BuSpar. reports taking medications daily. Champayne reports a history of cutting and self injurious behaviors .  Denies previous inpatient  admissions  Chemical Use History: Denied  Family of Origin Issues:  Reported her family continues to be supportive   Progress in Program Toward Treatment Goals: Ongoing, patient attended and participated with daily group session.  Patient presented with a lot of mood irritability and fluctuations. Gerene continued to need constant reassurance and redirection related to daily stressors. Patient appears to be very mood dependent. Avanni is very medication focused, has requested to be initiated on antipsychotic.  Discussed patient to follow-up with primary psychiatrist for adjustment with medications.  Patient was encouraged to continue current medication regimen and do not stop abruptly, as patient reported discontinuing all medications.  Patient appeared receptive to follow-up with primary psychiatrist and patient to consider weekly therapy sessions after discharge.  Progress (rationale): Keep follow-up with Kalman Jewels and [facility].  Consider follow-up with  Dialectical Behavior Therapy (DBT)  Take all medications as prescribed. Keep all follow-up appointments as scheduled.  Do not consume alcohol or use illegal drugs while on prescription medications. Report any adverse effects from your medications to your primary care provider promptly.  In the event of recurrent symptoms or worsening symptoms, call 911, a crisis hotline, or go to the nearest emergency department for evaluation.    Derrill Center, NP 08/10/2018

## 2018-08-11 ENCOUNTER — Other Ambulatory Visit (HOSPITAL_COMMUNITY): Payer: 59 | Admitting: Psychiatry

## 2018-08-11 ENCOUNTER — Other Ambulatory Visit: Payer: Self-pay

## 2018-08-11 ENCOUNTER — Encounter: Payer: Self-pay | Admitting: Family Medicine

## 2018-08-11 ENCOUNTER — Encounter (HOSPITAL_COMMUNITY): Payer: Self-pay | Admitting: Psychiatry

## 2018-08-11 DIAGNOSIS — F3162 Bipolar disorder, current episode mixed, moderate: Secondary | ICD-10-CM

## 2018-08-11 DIAGNOSIS — F418 Other specified anxiety disorders: Secondary | ICD-10-CM

## 2018-08-11 DIAGNOSIS — F411 Generalized anxiety disorder: Secondary | ICD-10-CM

## 2018-08-11 NOTE — Progress Notes (Signed)
Virtual Visit via Video Note  I connected with [name] on 08/11/18 at  9:00 AM EDT by a video enabled telemedicine application and verified that I am speaking with the correct person using two identifiers.  Location: Patient: Kathryn [name] Provider: Lise Auer   I discussed the limitations of evaluation and management by telemedicine and the availability of in person appointments. The patient expressed understanding and agreed to proceed.  History of Present Illness: Bipolar 1, GAD,  and Depression with anxiety.   Observations/Objective: Case Manager checked in with all participants to review discharge dates, insurance authorizations, work-related documents and needs for the treatment team. Counselor engaged the group in sharing what would be on their "Life Playlist" and what 5-10-character traits are they most proud of. Terrel shared applicable songs for her current life journey and many positive qualities she sees in herself. Counselor introduced guest speaker, Jeanella Craze, Lead [employer], to facilitate a discussion around Grief and Loss topics.Amabel opened up more about her childhood and the losses of important people. Yalena did not return to group after our break. In processing later, she shared that she was feeling Manic and needed to call her mom for support.   Assessment and Plan: Counselor recommends that Ersie remains in IOP treatment to better manage mental health symptoms and continue to address treatment plan goals. Counselor recommends adherence to crisis/safety plan, taking medications as prescribed and following up with medical professionals if any issues arise.   Follow Up Instructions: Counselor will send Webex link for next session.    I discussed the assessment and treatment plan with the patient. The patient was provided an opportunity to ask questions and all were answered. The patient agreed with the plan and demonstrated an understanding of the  instructions.   The patient was advised to call back or seek an in-person evaluation if the symptoms worsen or if the condition fails to improve as anticipated.  I provided 90 minutes of non-face-to-face time during this encounter.   Lise Auer, LCSW

## 2018-08-11 NOTE — Patient Instructions (Signed)
D:  Patient will be discharged on 08-12-18.  A:  Follow up with Dr. Montel Culver on 08-15-18 @ 9 a.m and [name], [facility] on 08-16-18 @ 9 am.  Encouraged support groups.  Return to [facility] on 08-22-18.  R:  Pt receptive.

## 2018-08-12 ENCOUNTER — Other Ambulatory Visit (HOSPITAL_COMMUNITY): Payer: 59 | Admitting: Licensed Clinical Social Worker

## 2018-08-12 ENCOUNTER — Other Ambulatory Visit: Payer: Self-pay

## 2018-08-12 ENCOUNTER — Encounter (HOSPITAL_COMMUNITY): Payer: Self-pay | Admitting: Psychiatry

## 2018-08-12 DIAGNOSIS — F418 Other specified anxiety disorders: Secondary | ICD-10-CM

## 2018-08-12 DIAGNOSIS — F3162 Bipolar disorder, current episode mixed, moderate: Secondary | ICD-10-CM
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
## 2018-08-12 NOTE — Progress Notes (Addendum)
Virtual Visit via Video Note  I connected with Kathryn [name] on 08/12/18 at  9:00 AM EDT by a video enabled telemedicine application and verified that I am speaking with the correct person using two identifiers.  Location: Patient: Kathryn [name] Provider: Lise Auer, LCSW   I discussed the limitations of evaluation and management by telemedicine and the availability of in person appointments. The patient expressed understanding and agreed to proceed.  History of Present Illness: Depression with anxiety, Bipolar 1 and GAD.    Observations/Objective: Case Manager checked in with all participants to review discharge dates, insurance authorizations, work-related documents and needs for the treatment team. Kathryn [name] apologized to the group for missing yesterday and shared about her manic episode. We all discussed how starting to work on mental health can cause body reactions because they have become skilled at holding everything in. Counselor praised Production assistant, radio for sharing and for reengaging in services. Counselor engaged the group in sharing what is the wildest things they want to do or try. Kathryn [name] shared several activities she would love to try. Counselor facilitated a Chief Operating Officer, where group members shared about community, local, national and phone resources to support their mental health. Kathryn [name] was appreciative for the information.  Counselor led the group in an activity called "Pocket Coping Skills" where they make a list of coping skills they can use in various settings. Kathryn [name] shared that she was having a hard time participating fully because her mind was on a lot of things, but she was able to identify a few.  Each group member shared their lists and did a show and share on teaching one skill to the group. At this point Kathryn [name] did not return to participate in group. Case manager was made aware to check in on her.  Assessment and Plan: Counselor recommends that Kathryn [name] remains in IOP treatment to  better manage mental health symptoms and continue to address treatment plan goals. Counselor recommends adherence to crisis/safety plan, taking medications as prescribed and following up with medical professionals if any issues arise.   Follow Up Instructions: Counselor will send Webex link for next session.    I discussed the assessment and treatment plan with the patient. The patient was provided an opportunity to ask questions and all were answered. The patient agreed with the plan and demonstrated an understanding of the instructions.   The patient was advised to call back or seek an in-person evaluation if the symptoms worsen or if the condition fails to improve as anticipated.  I provided 130 minutes of non-face-to-face time during this encounter.   Lise Auer, LCSW

## 2018-08-15 ENCOUNTER — Other Ambulatory Visit: Payer: Self-pay

## 2018-08-15 ENCOUNTER — Encounter (HOSPITAL_COMMUNITY): Payer: Self-pay | Admitting: Psychiatry

## 2018-08-15 ENCOUNTER — Ambulatory Visit (INDEPENDENT_AMBULATORY_CARE_PROVIDER_SITE_OTHER): Payer: 59 | Admitting: Psychiatry

## 2018-08-15 ENCOUNTER — Ambulatory Visit (HOSPITAL_COMMUNITY): Payer: 59

## 2018-08-15 DIAGNOSIS — F909 Attention-deficit hyperactivity disorder, unspecified type: Secondary | ICD-10-CM | POA: Diagnosis not present

## 2018-08-15 DIAGNOSIS — F1210 Cannabis abuse, uncomplicated: Secondary | ICD-10-CM | POA: Insufficient documentation

## 2018-08-15 DIAGNOSIS — F3161 Bipolar disorder, current episode mixed, mild: Secondary | ICD-10-CM

## 2018-08-15 DIAGNOSIS — F1220 Cannabis dependence, uncomplicated: Secondary | ICD-10-CM

## 2018-08-15 DIAGNOSIS — F411 Generalized anxiety disorder: Secondary | ICD-10-CM | POA: Insufficient documentation

## 2018-08-15 MED ORDER — LAMOTRIGINE 150 MG PO TABS: 150.0000 mg | ORAL_TABLET | Freq: Every day | ORAL | 1 refills | Status: DC

## 2018-08-15 MED ORDER — ZOLPIDEM TARTRATE 5 MG PO TABS: 2.5000 mg | ORAL_TABLET | Freq: Every evening | ORAL | 1 refills | Status: DC | PRN

## 2018-08-15 MED ORDER — VENLAFAXINE HCL ER 75 MG PO CP24: 75.0000 mg | ORAL_CAPSULE | Freq: Every day | ORAL | 1 refills | Status: DC

## 2018-08-15 MED ORDER — BUSPIRONE HCL 15 MG PO TABS: 15.0000 mg | ORAL_TABLET | Freq: Two times a day (BID) | ORAL | 2 refills | Status: DC

## 2018-08-15 NOTE — Progress Notes (Signed)
BH MD/PA/NP OP Progress Note  08/15/2018 9:28 AM Korrin Waterfield  MRN:  [id number] Interview was conducted using WebEx teleconferencing application and I verified that I was speaking with the correct person using two identifiers. I discussed the limitations of evaluation and management by telemedicine and  the availability of in person appointments. Patient expressed understanding and agreed to proceed.  Chief Complaint: "I am doing much better".  HPI: 23 yo single white female who has previously been seen by Dr. De Nurse (fisrst visit in 10/25/17, last on 06/09/18). She has been diagnosed with bipolar 1 disorder, ADHD and GAD (also cannabis use disorder). She has been on venlafaxine/buspirone for a long time for anxiety/depression. Dr. De Nurse added lamotrigine for mood stabilization and she has been recently taking 100 mg ay bedtime. She also takes Adderall 10 mg bid for ADGD prescribed by Dr. Randel Pigg. In late May Zayden started feeling more depressed and admitted to having passive SI. She entered IOP ion 6/620 and completed it on 08/06/18. Her mood improved and she denies having any further SI. Her affect is bright, mood practically neutral. Sleep is good with 5 mg of zolpidem. She has no hx of psychotic sx. She would like to try coming off Adderall and see if she continue without it. She feels that it tends to increase her anxiety. In the past she tried XR for of Adderall but did not judge it to be effective hence switch to IR. Sahra has a hx of cannabis abuse and at this time she smokes it practically on daily basis. She works at [employer]. EMR notes have been reviewed thoroughly.  Visit Diagnosis:    ICD-10-CM   1. Attention deficit hyperactivity disorder (ADHD), unspecified ADHD type  F90.9   2. Bipolar 1 disorder, mixed, mild ([place])  F31.61   3. Cannabis use disorder, moderate, dependence (HCC)  F12.20   4. GAD (generalized anxiety disorder)  F41.1     Past Psychiatric History: Please refer  to intake H&P (10/25/17).  Past Medical History:  Past Medical History:  Diagnosis Date  . Abnormal liver function tests 02/02/2016  . ADHD 06/10/2016  . Alcohol use 02/23/2016  . Allergy   . Anxiety   . Arm fracture, right    as child, fx in three places, no surgery  . Depression   . Depression with anxiety 05-10-2014   PTSD s/p death of best friend   . Essential hypertension 02/02/2016  . GERD (gastroesophageal reflux disease)   . 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 Headache 04/09/2016  . Hyperlipidemia, mixed 02/02/2016  . Migraine   . Ovarian cyst   . PCOS (polycystic ovarian syndrome)   . Preventative health care 10/27/2015  . Thrombocytosis ([place]) 04/09/2016    Past Surgical History:  Procedure Laterality Date  . ADENOIDECTOMY  2002  . TYMPANOSTOMY TUBE PLACEMENT Bilateral 1999    Family Psychiatric History: Reviewed.  Family History:  Family History  Problem Relation Age of Onset  . Hypertension Mother   . Heart disease Mother        CAD, stent at 43, s/p cardiac arrest with Defib  . Hyperlipidemia Mother   . Diabetes Mother        s/p gastric bypass  . Obesity Mother        s/p gastric bypass  . OCD Mother   . Hyperlipidemia Father   . Hypertension Father   . Anxiety disorder Father   . Asthma Sister   . Hyperlipidemia Brother   .  Anxiety disorder Brother   . Stroke Maternal Grandmother   . Thyroid disease Maternal Grandmother   . Anxiety disorder Maternal Grandmother   . Heart disease Maternal Grandfather        MI first at 17, s/p stents and CABG  . Hyperlipidemia Maternal Grandfather   . Hypertension Maternal Grandfather   . Stroke Maternal Grandfather   . Cancer Maternal Grandfather        melanoma  . Aneurysm Maternal Grandfather        brain  . Kidney disease Maternal Grandfather        tumor  . Heart disease Paternal Grandmother   . Cancer Paternal Lucilla Edin and brain  . Heart disease Maternal Uncle   . Vision loss Maternal Uncle   .  Anxiety disorder Maternal Uncle   . Anxiety disorder Maternal Aunt   . Depression Maternal Aunt     Social History:  Social History   Socioeconomic History  . Marital status: Single    Spouse name: Not on file  . Number of children: 0  . Years of education: 26  . Highest education level: Not on file  Occupational History    Comment: [employer]  Social Needs  . Financial resource strain: Not on file  . Food insecurity    Worry: Not on file    Inability: Not on file  . Transportation needs    Medical: Not on file    Non-medical: Not on file  Tobacco Use  . Smoking status: Heavy Tobacco Smoker    Packs/day: 1.50    Types: Cigarettes  . Smokeless tobacco: Current User    Types: Snuff  Substance and Sexual Activity  . Alcohol use: No    Alcohol/week: 0.0 standard drinks    Comment: maybe every 6 months  . Drug use: Yes    Types: Marijuana    Comment: "once every six months"  . Sexual activity: Yes    Birth control/protection: Pill    Comment: lives with partner, works for [employer], no diewtary restrictions.   Lifestyle  . Physical activity    Days per week: Not on file    Minutes per session: Not on file  . Stress: Not on file  Relationships  . Social Herbalist on phone: Not on file    Gets together: Not on file    Attends religious service: Not on file    Active member of club or organization: Not on file    Attends meetings of clubs or organizations: Not on file    Relationship status: Not on file  Other Topics Concern  . Not on file  Social History Narrative   Lives with girlfriend's family   Caffeine- soda and tea, 6 cans daily    Allergies:  Allergies  Allergen Reactions  . Oxycodone Nausea Only    Oxycontin also  . Seroquel [Quetiapine Fumarate]     Mental status changes    Metabolic Disorder Labs: Lab Results  Component Value Date   HGBA1C 5.5 08/04/2017   No results found for: PROLACTIN Lab Results  Component Value  Date   CHOL 218 (H) 05/05/2017   TRIG 107.0 05/05/2017   HDL 42.00 05/05/2017   CHOLHDL 5 05/05/2017   VLDL 21.4 05/05/2017   LDLCALC 155 (H) 05/05/2017   Lab Results  Component Value Date   TSH 2.49 05/05/2017   TSH 1.89 01/22/2016    Therapeutic Level  Labs: No results found for: LITHIUM No results found for: VALPROATE No components found for:  CBMZ  Current Medications: Current Outpatient Medications  Medication Sig Dispense Refill  . albuterol (PROVENTIL HFA;VENTOLIN HFA) 108 (90 Base) MCG/ACT inhaler Inhale 2 puffs into the lungs every 6 (six) hours as needed for wheezing or shortness of breath. 3 Inhaler 0  . amphetamine-dextroamphetamine (ADDERALL) 10 MG tablet Take 1 tablet (10 mg total) by mouth 2 (two) times daily with a meal. August 2020 60 tablet 0  . amphetamine-dextroamphetamine (ADDERALL) 10 MG tablet Take 1 tablet (10 mg total) by mouth 2 (two) times daily with a meal. July 2020 60 tablet 0  . amphetamine-dextroamphetamine (ADDERALL) 10 MG tablet Take 1 tablet (10 mg total) by mouth 2 (two) times daily with a meal. June 2020 60 tablet 0  . busPIRone (BUSPAR) 10 MG tablet Take 1 tablet (10 mg total) by mouth 3 (three) times daily. 90 tablet 1  . famotidine (PEPCID) 20 MG tablet Take 1 tablet (20 mg total) by mouth 2 (two) times daily. 180 tablet 0  . gabapentin (NEURONTIN) 300 MG capsule Take 1 capsule (300 mg total) by mouth 3 (three) times daily. 270 capsule 3  . lamoTRIgine (LAMICTAL) 100 MG tablet Take 1 tablet (100 mg total) by mouth daily. Take one a day 90 tablet 0  . Levonorgestrel-Ethinyl Estradiol (AMETHIA,CAMRESE) 0.15-0.03 &0.01 MG tablet TAKE 1 TABLET BY MOUTH EVERY DAY 91 tablet 1  . mometasone-formoterol (DULERA) 100-5 MCG/ACT AERO Inhale 2 puffs into the lungs 2 (two) times daily. 3 Inhaler 1  . montelukast (SINGULAIR) 10 MG tablet Take 1 tablet (10 mg total) by mouth at bedtime. 90 tablet 1  . omeprazole (PRILOSEC) 40 MG capsule Take 1 capsule (40 mg  total) by mouth 2 (two) times daily. 180 capsule 3  . ondansetron (ZOFRAN) 4 MG tablet Take 1 tablet (4 mg total) by mouth every 8 (eight) hours as needed for nausea or vomiting. 20 tablet 0  . valACYclovir (VALTREX) 1000 MG tablet Take 1 tablet (1,000 mg total) by mouth 2 (two) times daily. 60 tablet 5  . venlafaxine XR (EFFEXOR-XR) 75 MG 24 hr capsule Take 1 capsule (75 mg total) by mouth daily with breakfast. 90 capsule 1  . zolpidem (AMBIEN) 5 MG tablet Take 0.5-1 tablets (2.5-5 mg total) by mouth at bedtime as needed for sleep. 30 tablet 1   No current facility-administered medications for this visit.      Psychiatric Specialty Exam: Review of Systems  Psychiatric/Behavioral: The patient is nervous/anxious.   All other systems reviewed and are negative.   There were no vitals taken for this visit.There is no height or weight on file to calculate BMI.  General Appearance: Casual and Fairly Groomed  Eye Contact:  Good  Speech:  Clear and Coherent  Volume:  Normal  Mood:  Anxious  Affect:  Full Range  Thought Process:  Goal Directed  Orientation:  Full (Time, Place, and Person)  Thought Content: Logical   Suicidal Thoughts:  No  Homicidal Thoughts:  No  Memory:  Immediate;   Good Recent;   Good Remote;   Good  Judgement:  Fair  Insight:  Fair  Psychomotor Activity:  Normal  Concentration:  Concentration: Good  Recall:  Good  Fund of Knowledge: Good  Language: Good  Akathisia:  Negative  Handed:  Right  AIMS (if indicated): not done  Assets:  Communication Skills Desire for Improvement Financial Resources/Insurance Housing Physical Health Resilience  ADL's:  Intact  Cognition: WNL  Sleep:  Good   Screenings: PHQ2-9     Office Visit from 08/22/2015 in [facility]  PHQ-2 Total Score  5  PHQ-9 Total Score  19       Assessment and Plan: 23 yo single white female who has previously been seen by Dr. De Nurse (fisrst visit in  10/25/17, last on 06/09/18). She has been diagnosed with bipolar 1 disorder, ADHD and GAD (also cannabis use disorder). She has been on venlafaxine/buspirone for a long time for anxiety/depression. Dr. De Nurse added lamotrigine for mood stabilization and she has been recently taking 100 mg ay bedtime. She also takes Adderall 10 mg bid for ADGD prescribed by Dr. Randel Pigg. In late May Tahani started feeling more depressed and admitted to having passive SI. She entered IOP ion 6/620 and completed it on 08/06/18. Her mood improved and she denies having any further SI. Her affect is bright, mood practically neutral. Sleep is good with 5 mg of zolpidem. She has no hx of psychotic sx. She would like to try coming off Adderall and see if she continue without it. She feels that it tends to increase her anxiety. In the past she tried XR for of Adderall but did not judge it to be effective hence switch to IR. Jemimah has a hx of cannabis abuse and at this time she smokes it practically on daily basis.   Plan: We will increase dose of Lamictal to 150 mg at HS, change buspirone to 15 mg bid an discontinue Effexor xr 75 mg daily and Ambien 5 mg prn sleep unchanged. She wants to try to come off Adderall and I agree with that. Should she decide she needs to go back on it we could try a higher dose of Adderall XR 30 mg to see if she will be less anxious than on IR while maintaining efficacy. Tonnia will let us know if she wants to try this change. The plan was discussed with patient who had an opportunity to ask questions and these were all answered. I spend 40 minutes in indirect clinical contact with the patient and devoted approximately 50% of this time to discussion of treatment options and med education. Next appointment in 1 month.  Stephanie Acre, MD 08/15/2018, 9:28 AM

## 2018-08-16 ENCOUNTER — Ambulatory Visit (HOSPITAL_COMMUNITY): Payer: 59

## 2018-08-16 ENCOUNTER — Other Ambulatory Visit: Payer: Self-pay

## 2018-08-16 ENCOUNTER — Ambulatory Visit (INDEPENDENT_AMBULATORY_CARE_PROVIDER_SITE_OTHER): Payer: 59 | Admitting: Psychology

## 2018-08-16 ENCOUNTER — Encounter (HOSPITAL_COMMUNITY): Payer: Self-pay | Admitting: Psychology

## 2018-08-16 DIAGNOSIS — F909 Attention-deficit hyperactivity disorder, unspecified type: Secondary | ICD-10-CM

## 2018-08-16 DIAGNOSIS — F3161 Bipolar disorder, current episode mixed, mild: Secondary | ICD-10-CM | POA: Diagnosis not present

## 2018-08-16 DIAGNOSIS — F418 Other specified anxiety disorders: Secondary | ICD-10-CM | POA: Diagnosis not present
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
## 2018-08-19 ENCOUNTER — Other Ambulatory Visit: Payer: Self-pay | Admitting: Family Medicine

## 2018-08-19 DIAGNOSIS — N946 Dysmenorrhea, unspecified: Secondary | ICD-10-CM

## 2018-08-19 NOTE — Telephone Encounter (Signed)
Pt is calling back with the correct pharm [facility] phone number  [phone]

## 2018-08-19 NOTE — Telephone Encounter (Signed)
Medication: Levonorgestrel-Ethinyl Estradiol (AMETHIA,CAMRESE) 0.15-0.03 &0.01 MG tablet [[id number]]   Has the patient contacted their pharmacy? Yes (Agent: If no, request that the patient contact the pharmacy for the refill.) (Agent: If yes, when and what did the pharmacy advise?)  Preferred Pharmacy (with phone number or street name): [facility] [address] [place], [place] [address] [phone]  Agent: Please be advised that RX refills may take up to 3 business days. We ask that you follow-up with your pharmacy.

## 2018-08-22 MED ORDER — LEVONORGEST-ETH ESTRAD 91-DAY 0.15-0.03 &0.01 MG PO TABS: 1.0000 | ORAL_TABLET | Freq: Every day | ORAL | 1 refills | Status: DC

## 2018-08-22 NOTE — Telephone Encounter (Signed)
Rx sent 

## 2018-08-23 ENCOUNTER — Other Ambulatory Visit: Payer: Self-pay

## 2018-08-23 ENCOUNTER — Telehealth: Payer: Self-pay | Admitting: Family Medicine

## 2018-08-23 NOTE — Telephone Encounter (Signed)
Spoke with patient and also Brion Aliment about patients STD. Lorie faxed over new forms. Forms filled out and faxed back to [facility] with a confirmation

## 2018-08-23 NOTE — Telephone Encounter (Signed)
Spoke with patient and re sent papers to [facility] STD. Received confirmation

## 2018-08-23 NOTE — Telephone Encounter (Signed)
Pt called stating she has been trying to get her short term disability paperwork sent to her employer. Pt states she is now going on two months without pay. Pt states she is upset because she has called multiple times and has not received a callback. Attempted to get in contact with office was told to send CRM. Please advise.

## 2018-08-23 NOTE — Progress Notes (Signed)
Virtual Visit via Video Note  I connected with Kathryn [name] on 08/12/2018 at  9:00 AM EDT by a video enabled telemedicine application and verified that I am speaking with the correct person using two identifiers.   I discussed the limitations of evaluation and management by telemedicine and the availability of in person appointments. The patient expressed understanding and agreed to proceed.  I discussed the assessment and treatment plan with the patient. The patient was provided an opportunity to ask questions and all were answered. The patient agreed with the plan and demonstrated an understanding of the instructions.   The patient was advised to call back or seek an in-person evaluation if the symptoms worsen or if the condition fails to improve as anticipated.  I provided 180 minutes of non-face-to-face time during this encounter.   Olegario Messier, LCSW     Daily Group Progress Note  Program: IOP  Group Time: 9am-12pm  Participation Level: Active  Behavioral Response: Appropriate  Type of Therapy:  Group Therapy; process group, psycho-educational group  Clinician and group members groups of coping skills including distraction, grounding, emotional release, self love, thought challenging, and the pros and cons of where each might be useful.  Clinician and group members discuss DBT skill Letting Go of Emotional Suffering: Mindfulness of Your Current Emotions and 'Riding the wave' of uncomfortable emotions. Clinician and group members practiced sensory solutions for calming down including meditation, affirmations, guided visualization, exercise, and journaling.   Summary of Progress:  Client reports continuing to struggle with impulse control related to substance use and passive suicidal thoughts. Client has been able to identify supports to reach out to and help keep herself safe. Client reports attempting to sit in uncomfortable feelings for a few seconds longer prior to using a  distraction skill, similar to the reviewed video.   Olegario Messier, LCSW

## 2018-08-24 ENCOUNTER — Ambulatory Visit (HOSPITAL_COMMUNITY): Payer: 59 | Admitting: Psychiatry

## 2018-08-31 ENCOUNTER — Other Ambulatory Visit: Payer: Self-pay

## 2018-08-31 ENCOUNTER — Ambulatory Visit (HOSPITAL_COMMUNITY): Payer: 59 | Admitting: Psychology

## 2018-09-07 ENCOUNTER — Other Ambulatory Visit: Payer: Self-pay

## 2018-09-07 ENCOUNTER — Ambulatory Visit (INDEPENDENT_AMBULATORY_CARE_PROVIDER_SITE_OTHER): Payer: 59 | Admitting: Psychology

## 2018-09-07 DIAGNOSIS — F3161 Bipolar disorder, current episode mixed, mild: Secondary | ICD-10-CM

## 2018-09-07 NOTE — Progress Notes (Signed)
Virtual Visit via Video Note  I connected with Kathryn [name] on 09/07/18 at  9:00 AM EDT by a video enabled telemedicine application and verified that I am speaking with the correct person using two identifiers.   I discussed the limitations of evaluation and management by telemedicine and the availability of in person appointments. The patient expressed understanding and agreed to proceed.    I discussed the assessment and treatment plan with the patient. The patient was provided an opportunity to ask questions and all were answered. The patient agreed with the plan and demonstrated an understanding of the instructions.   The patient was advised to call back or seek an in-person evaluation if the symptoms worsen or if the condition fails to improve as anticipated.  I provided 45 minutes of non-face-to-face time during this encounter.   [name] [facility]    THERAPIST PROGRESS NOTE  Session Time: 9.05am-9.50am  Participation Level: Active  Behavioral Response: Well GroomedAlertaffect blunted  Type of Therapy: Individual Therapy  Treatment Goals addressed: Diagnosis: Bipolar 1 d/o and goal 1.  Interventions: CBT and Strength-based  Summary: Kathryn [name] is a 23 y.o. female who presents with affect blunted.  Pt reported that one week ago moved back to [place], [place] w/ roommate as the support for living w/ family wasn't there as said.  Pt reported that family started asking her plans for where she was staying.  Pt reported that she is feeling apathy and lack of caring and recognizes that not good. Pt was able to see as response to lack of support that thought she had and recognized that family still doesn't understand mental health and just feels that she has done this to herself and needs to fix herself.  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  Pt previous request for transfer prior to FMLA was denied but increased awareness of need to seek again and maybe w/ HR support.  Pt also recognizes that w/ smoking marijuana that limits her ability to seek employment elsewhere till clears her system.  Pt reported stopped using yesterday for this reason.  Pt discussed healthy vs. Unhealthy coping skills and healthy boundaries w/ family.   Suicidal/Homicidal: Nowithout intent/plan  Therapist Response: Assessed pt current functioning per pt report. Processed w/ pt response from family and recognizing that may not be able to be support wanted and not internalizing this.  Discussed decisions facing for work/fiances and encouraged to explore options w/ current employer HR.  Discussed pt mood and ways of supporting w/ healthy coping skills and boundaries to keep.    Plan: Return again in 1 week, via webex and f/u w/ Dr. Miquel Dunn next week as scheduled.   Diagnosis: Bipolar 1 d/o   [name] [facility] 09/07/2018

## 2018-09-09 ENCOUNTER — Encounter: Payer: Self-pay | Admitting: Family Medicine

## 2018-09-12 ENCOUNTER — Telehealth: Payer: Self-pay

## 2018-09-12 MED ORDER — VALACYCLOVIR HCL 1 G PO TABS: 1000.0000 mg | ORAL_TABLET | Freq: Two times a day (BID) | ORAL | 1 refills | Status: DC

## 2018-09-12 NOTE — Telephone Encounter (Signed)
Received paperwork from [place]. For Behavioral health to complete. The form has been faxed over to Behavioral health. Patient made aware. I have faxed over the forms for the patient to have.

## 2018-09-13 NOTE — Telephone Encounter (Signed)
BH called and stated that they did not receive forms and would like [name] to fax it back to [phone]

## 2018-09-14 ENCOUNTER — Encounter (HOSPITAL_COMMUNITY): Payer: Self-pay

## 2018-09-14 ENCOUNTER — Ambulatory Visit (INDEPENDENT_AMBULATORY_CARE_PROVIDER_SITE_OTHER): Payer: 59 | Admitting: Psychiatry

## 2018-09-14 ENCOUNTER — Other Ambulatory Visit: Payer: Self-pay

## 2018-09-14 DIAGNOSIS — F411 Generalized anxiety disorder: Secondary | ICD-10-CM

## 2018-09-14 DIAGNOSIS — F3161 Bipolar disorder, current episode mixed, mild: Secondary | ICD-10-CM | POA: Diagnosis not present

## 2018-09-14 DIAGNOSIS — F1210 Cannabis abuse, uncomplicated: Secondary | ICD-10-CM | POA: Diagnosis not present

## 2018-09-14 DIAGNOSIS — F988 Other specified behavioral and emotional disorders with onset usually occurring in childhood and adolescence: Secondary | ICD-10-CM

## 2018-09-14 DIAGNOSIS — F900 Attention-deficit hyperactivity disorder, predominantly inattentive type: Secondary | ICD-10-CM

## 2018-09-14 MED ORDER — VENLAFAXINE HCL ER 75 MG PO CP24: 75.0000 mg | ORAL_CAPSULE | Freq: Every day | ORAL | 0 refills | Status: DC

## 2018-09-14 MED ORDER — ZOLPIDEM TARTRATE 10 MG PO TABS: 10.0000 mg | ORAL_TABLET | Freq: Every evening | ORAL | 2 refills | Status: DC | PRN

## 2018-09-14 MED ORDER — AMPHETAMINE-DEXTROAMPHETAMINE 15 MG PO TABS: 15.0000 mg | ORAL_TABLET | Freq: Two times a day (BID) | ORAL | 0 refills | Status: DC

## 2018-09-14 MED ORDER — BUSPIRONE HCL 15 MG PO TABS: 15.0000 mg | ORAL_TABLET | Freq: Two times a day (BID) | ORAL | 0 refills | Status: DC

## 2018-09-14 MED ORDER — LAMOTRIGINE 150 MG PO TABS: 150.0000 mg | ORAL_TABLET | Freq: Every day | ORAL | 0 refills | Status: DC

## 2018-09-14 NOTE — Telephone Encounter (Signed)
Faxed [facility] paperwork over this am with a fax confirmation

## 2018-09-14 NOTE — Progress Notes (Addendum)
BH MD/PA/NP OP Progress Note  09/14/2018 10:26 AM Kathryn [name]  MRN:  [id number] Interview was conducted by phone and I verified that I was speaking with the correct person using two identifiers. I discussed the limitations of evaluation and management by telemedicine and  the availability of in person appointments. Patient expressed understanding and agreed to proceed.  Chief Complaint: Mild anxiety, still some lack of focus.  HPI: 23 yo single white female who has previously been seen by Dr. De Nurse. She has been diagnosed with bipolar 1 disorder, ADHD and GAD (also cannabis use disorder). She has been on venlafaxine/buspirone for a long time for anxiety/depression. Dr. De Nurse added lamotrigine for mood stabilization and she has been recently taking 100 mg ay bedtime. She also takes Adderall 10 mg bid for ADD prescribed by Dr. Randel Pigg. In late May Kathryn [name] started feeling more depressed and admitted to having passive SI. She entered IOP on 6/620 and completed it on 08/06/18. Her mood improved and she denies having any further SI. Sleep is good when she takes 10 mg of zolpidem. She has no hx of psychotic sx. In the past she tried XR for of Adderall but did not judge it to be effective hence switch to IR which  Works but  Dose still appears suboptimal.  Kathryn [name] has a hx of cannabis abuse and at this time she smokes less frequently.   Visit Diagnosis:    ICD-10-CM   1. Bipolar 1 disorder, mixed, mild (HCC)  F31.61 busPIRone (BUSPAR) 15 MG tablet  2. Attention deficit disorder, unspecified hyperactivity presence  F98.8 amphetamine-dextroamphetamine (ADDERALL) 15 MG tablet    amphetamine-dextroamphetamine (ADDERALL) 15 MG tablet    amphetamine-dextroamphetamine (ADDERALL) 15 MG tablet  3. Cannabis use disorder, mild, abuse  F12.10   4. GAD (generalized anxiety disorder)  F41.1   5. Attention deficit hyperactivity disorder (ADHD), predominantly inattentive type  F90.0     Past Psychiatric History: Please  see intake H&P.  Past Medical History:  Past Medical History:  Diagnosis Date  . Abnormal liver function tests 02/02/2016  . ADHD 06/10/2016  . Alcohol use 02/23/2016  . Allergy   . Anxiety   . Arm fracture, right    as child, fx in three places, no surgery  . Depression   . Depression with anxiety 18-Apr-2014   PTSD s/p death of best friend   . Essential hypertension 02/02/2016  . GERD (gastroesophageal reflux disease)   . 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 Headache 04/09/2016  . Hyperlipidemia, mixed 02/02/2016  . Migraine   . Ovarian cyst   . PCOS (polycystic ovarian syndrome)   . Preventative health care 10/27/2015  . Thrombocytosis ([place]) 04/09/2016    Past Surgical History:  Procedure Laterality Date  . ADENOIDECTOMY  2002  . TYMPANOSTOMY TUBE PLACEMENT Bilateral 1999    Family Psychiatric History: Reviewed.  Family History:  Family History  Problem Relation Age of Onset  . Hypertension Mother   . Heart disease Mother        CAD, stent at 29, s/p cardiac arrest with Defib  . Hyperlipidemia Mother   . Diabetes Mother        s/p gastric bypass  . Obesity Mother        s/p gastric bypass  . OCD Mother   . Hyperlipidemia Father   . Hypertension Father   . Anxiety disorder Father   . Asthma Sister   . Hyperlipidemia Brother   . Anxiety disorder Brother   . Stroke Maternal  Grandmother   . Thyroid disease Maternal Grandmother   . Anxiety disorder Maternal Grandmother   . Heart disease Maternal Grandfather        MI first at 16, s/p stents and CABG  . Hyperlipidemia Maternal Grandfather   . Hypertension Maternal Grandfather   . Stroke Maternal Grandfather   . Cancer Maternal Grandfather        melanoma  . Aneurysm Maternal Grandfather        brain  . Kidney disease Maternal Grandfather        tumor  . Heart disease Paternal Grandmother   . Cancer Paternal Lucilla Edin and brain  . Heart disease Maternal Uncle   . Vision loss Maternal Uncle   . Anxiety  disorder Maternal Uncle   . Anxiety disorder Maternal Aunt   . Depression Maternal Aunt     Social History:  Social History   Socioeconomic History  . Marital status: Single    Spouse name: Not on file  . Number of children: 0  . Years of education: 72  . Highest education level: Not on file  Occupational History    Comment: [employer]  Social Needs  . Financial resource strain: Not on file  . Food insecurity    Worry: Not on file    Inability: Not on file  . Transportation needs    Medical: Not on file    Non-medical: Not on file  Tobacco Use  . Smoking status: Heavy Tobacco Smoker    Packs/day: 1.50    Types: Cigarettes  . Smokeless tobacco: Current User    Types: Snuff  Substance and Sexual Activity  . Alcohol use: No    Alcohol/week: 0.0 standard drinks    Comment: maybe every 6 months  . Drug use: Yes    Types: Marijuana    Comment: daily  . Sexual activity: Yes    Birth control/protection: Pill  Lifestyle  . Physical activity    Days per week: Not on file    Minutes per session: Not on file  . Stress: Not on file  Relationships  . Social Herbalist on phone: Not on file    Gets together: Not on file    Attends religious service: Not on file    Active member of club or organization: Not on file    Attends meetings of clubs or organizations: Not on file    Relationship status: Not on file  Other Topics Concern  . Not on file  Social History Narrative  . Not on file    Allergies:  Allergies  Allergen Reactions  . Oxycodone Nausea Only    Oxycontin also  . Seroquel [Quetiapine Fumarate]     Mental status changes    Metabolic Disorder Labs: Lab Results  Component Value Date   HGBA1C 5.5 08/04/2017   No results found for: PROLACTIN Lab Results  Component Value Date   CHOL 218 (H) 05/05/2017   TRIG 107.0 05/05/2017   HDL 42.00 05/05/2017   CHOLHDL 5 05/05/2017   VLDL 21.4 05/05/2017   LDLCALC 155 (H) 05/05/2017   Lab  Results  Component Value Date   TSH 2.49 05/05/2017   TSH 1.89 01/22/2016    Therapeutic Level Labs: No results found for: LITHIUM No results found for: VALPROATE No components found for:  CBMZ  Current Medications: Current Outpatient Medications  Medication Sig Dispense Refill  . albuterol (PROVENTIL HFA;VENTOLIN HFA) 108 (90  Base) MCG/ACT inhaler Inhale 2 puffs into the lungs every 6 (six) hours as needed for wheezing or shortness of breath. 3 Inhaler 0  . [START ON 11/16/2018] amphetamine-dextroamphetamine (ADDERALL) 15 MG tablet Take 1 tablet by mouth 2 (two) times daily with a meal. August 2020 60 tablet 0  . [START ON 10/15/2018] amphetamine-dextroamphetamine (ADDERALL) 15 MG tablet Take 1 tablet by mouth 2 (two) times daily with a meal. July 2020 60 tablet 0  . amphetamine-dextroamphetamine (ADDERALL) 15 MG tablet Take 1 tablet by mouth 2 (two) times daily with a meal. June 2020 60 tablet 0  . busPIRone (BUSPAR) 15 MG tablet Take 1 tablet (15 mg total) by mouth 2 (two) times daily. 180 tablet 0  . famotidine (PEPCID) 20 MG tablet Take 1 tablet (20 mg total) by mouth 2 (two) times daily. 180 tablet 0  . gabapentin (NEURONTIN) 300 MG capsule Take 1 capsule (300 mg total) by mouth 3 (three) times daily. 270 capsule 3  . lamoTRIgine (LAMICTAL) 150 MG tablet Take 1 tablet (150 mg total) by mouth at bedtime. Take one a day 90 tablet 0  . Levonorgestrel-Ethinyl Estradiol (AMETHIA) 0.15-0.03 &0.01 MG tablet Take 1 tablet by mouth daily. 91 tablet 1  . mometasone-formoterol (DULERA) 100-5 MCG/ACT AERO Inhale 2 puffs into the lungs 2 (two) times daily. 3 Inhaler 1  . montelukast (SINGULAIR) 10 MG tablet Take 1 tablet (10 mg total) by mouth at bedtime. 90 tablet 1  . omeprazole (PRILOSEC) 40 MG capsule Take 1 capsule (40 mg total) by mouth 2 (two) times daily. 180 capsule 3  . ondansetron (ZOFRAN) 4 MG tablet Take 1 tablet (4 mg total) by mouth every 8 (eight) hours as needed for nausea or  vomiting. 20 tablet 0  . valACYclovir (VALTREX) 1000 MG tablet Take 1 tablet (1,000 mg total) by mouth 2 (two) times daily. 180 tablet 1  . venlafaxine XR (EFFEXOR-XR) 75 MG 24 hr capsule Take 1 capsule (75 mg total) by mouth daily with breakfast. 90 capsule 0  . zolpidem (AMBIEN) 10 MG tablet Take 1 tablet (10 mg total) by mouth at bedtime as needed for sleep. 30 tablet 2   No current facility-administered medications for this visit.     Psychiatric Specialty Exam: Review of Systems  Psychiatric/Behavioral: The patient is nervous/anxious.   All other systems reviewed and are negative.   There were no vitals taken for this visit.There is no height or weight on file to calculate BMI.  General Appearance: NA  Eye Contact:  NA  Speech:  Clear and Coherent and Normal Rate  Volume:  Normal  Mood:  Anxious  Affect:  NA  Thought Process:  Goal Directed  Orientation:  Full (Time, Place, and Person)  Thought Content: Logical   Suicidal Thoughts:  No  Homicidal Thoughts:  No  Memory:  Immediate;   Good Recent;   Good Remote;   Good  Judgement:  Good  Insight:  Good  Psychomotor Activity:  NA  Concentration:  Concentration: Fair  Recall:  Good  Fund of Knowledge: Good  Language: Good  Akathisia:  Negative  Handed:  Right  AIMS (if indicated): not done  Assets:  Communication Skills Desire for Improvement Financial Resources/Insurance Housing Physical Health Social Support  ADL's:  Intact  Cognition: WNL  Sleep:  Good   Screenings: PHQ2-9     Office Visit from 08/22/2015 in [employer] [facility] [employer] [facility]  PHQ-2 Total Score  5  PHQ-9 Total Score  19       Assessment and Plan: 23 yo single white female who has previously been seen by Dr. De Nurse. She has been diagnosed with bipolar 1 disorder, ADHD and GAD (also cannabis use disorder). She has been on venlafaxine/buspirone for a long time for anxiety/depression. Dr. De Nurse added lamotrigine for mood  stabilization and she has been recently taking 100 mg ay bedtime. She also takes Adderall 10 mg bid for ADD prescribed by Dr. Randel Pigg. In late May Sandrine started feeling more depressed and admitted to having passive SI. She entered IOP on 6/620 and completed it on 08/06/18. Her mood improved and she denies having any further SI. Sleep is good when she takes 10 mg of zolpidem. She has no hx of psychotic sx. In the past she tried XR for of Adderall but did not judge it to be effective hence switch to IR which  Works but  Dose still appears suboptimal.  Paulette has a hx of cannabis abuse and at this time she smokes less frequently.   Plan: We will continue Lamictal to 150 mg at HS, buspirone 15 mg bid, Effexor xr 75 mg daily and increase Ambien to 10 mg prn sleep. Adderall IR will be increased to 155 mg bid (she does not take it each time). 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 The plan was discussed with patient who had an opportunity to ask questions and these were all answered. I spend 25 minutes in indirect clinical contact with the patient     Stephanie Acre, MD 09/14/2018, 10:26 AM

## 2018-09-15 ENCOUNTER — Ambulatory Visit (INDEPENDENT_AMBULATORY_CARE_PROVIDER_SITE_OTHER): Payer: 59 | Admitting: Psychology

## 2018-09-15 ENCOUNTER — Other Ambulatory Visit: Payer: Self-pay

## 2018-09-15 DIAGNOSIS — F3161 Bipolar disorder, current episode mixed, mild: Secondary | ICD-10-CM | POA: Diagnosis not present

## 2018-09-15 NOTE — Progress Notes (Signed)
Virtual Visit via Video Note  I connected with Kathryn [name] on 09/15/18 at  8:00 AM EDT by a video enabled telemedicine application and verified that I am speaking with the correct person using two identifiers.   I discussed the limitations of evaluation and management by telemedicine and the availability of in person appointments. The patient expressed understanding and agreed to proceed.    I discussed the assessment and treatment plan with the patient. The patient was provided an opportunity to ask questions and all were answered. The patient agreed with the plan and demonstrated an understanding of the instructions.   The patient was advised to call back or seek an in-person evaluation if the symptoms worsen or if the condition fails to improve as anticipated.  I provided 35 minutes of non-face-to-face time during this encounter.   [name] [facility]    THERAPIST PROGRESS NOTE  Session Time: 8am-8.35am  Participation Level: Active  Behavioral Response: Well GroomedAlertaffect wnl  Type of Therapy: Individual Therapy  Treatment Goals addressed: Diagnosis: Bipolar 1 d/o and goal 1.  Interventions: CBT and Assertiveness Training  Summary: Kathryn [name] is a 23 y.o. female who presents with affect wnl.  pt reported she is doing much better this week.  Pt reported that she spoke w/ regional manager who immediately agreed to a transfer and pt met w/ Dr. Montel Culver who is signing for her to return to work.  Pt was able to identify that it [place] a place to live and a job for the next year- also can keep her insurance to continue her tx.  Pt reported she talked w/ mom and did express how she feels about things and has had a little less contact since. Pt reflected that although she is making changes for self other may not be and will have to deal w/ some of same patterns as before- just focus on her reaction.  Pt reported that she went off on  dad when he continued to push her to talk about what was wrong in her childhood in a phone call when she was stating she was done talking.  Pt recognized that could have handled better by just hanging up sooner. Pt discussed her plans for the weekend accomplishing things around the house and time w/ her ex's daughter.   Suicidal/Homicidal: Nowithout intent/plan  Therapist Response: Assessed pt current functioning per pt report.  Processed w/pt her decision and reflecting pt good insight and f/u w asserting needs w/ work.  Explored w/pt her interactions w/ parents and assisted in identifying communication that was assertive and ways could have improved.   Plan: Return again in 1 week, via webex.  Diagnosis: Bipolar d/o  [name] [facility] 09/15/2018

## 2018-09-21 ENCOUNTER — Other Ambulatory Visit: Payer: Self-pay

## 2018-09-21 ENCOUNTER — Ambulatory Visit (HOSPITAL_COMMUNITY): Payer: 59 | Admitting: Psychology

## 2018-09-21 ENCOUNTER — Encounter (HOSPITAL_COMMUNITY): Payer: Self-pay | Admitting: Psychology

## 2018-09-21 NOTE — Progress Notes (Signed)
Kathryn [name] is a 23 y.o. female patient who didn't show for her virtual appointment through webex. Informed by email.  Pt is scheduled for f/u in 2 weeks. Marland Kitchen        [name], [facility]

## 2018-09-26 ENCOUNTER — Encounter (HOSPITAL_COMMUNITY): Payer: Self-pay | Admitting: Licensed Clinical Social Worker

## 2018-10-05 ENCOUNTER — Ambulatory Visit (INDEPENDENT_AMBULATORY_CARE_PROVIDER_SITE_OTHER): Payer: 59 | Admitting: Psychology

## 2018-10-05 ENCOUNTER — Other Ambulatory Visit: Payer: Self-pay

## 2018-10-05 DIAGNOSIS — F3161 Bipolar disorder, current episode mixed, mild: Secondary | ICD-10-CM | POA: Diagnosis not present

## 2018-10-05 DIAGNOSIS — F411 Generalized anxiety disorder: Secondary | ICD-10-CM | POA: Diagnosis not present

## 2018-10-05 NOTE — Progress Notes (Signed)
Virtual Visit via Video Note  I connected with Kathryn [name] on 10/05/18 at  9:00 AM EDT by a video enabled telemedicine application and verified that I am speaking with the correct person using two identifiers.   I discussed the limitations of evaluation and management by telemedicine and the availability of in person appointments. The patient expressed understanding and agreed to proceed.    I discussed the assessment and treatment plan with the patient. The patient was provided an opportunity to ask questions and all were answered. The patient agreed with the plan and demonstrated an understanding of the instructions.   The patient was advised to call back or seek an in-person evaluation if the symptoms worsen or if the condition fails to improve as anticipated.  I provided 48 minutes of non-face-to-face time during this encounter.   [name] [facility]    THERAPIST PROGRESS NOTE  Session Time: 9am-9.48am  Participation Level: Active  Behavioral Response: Well GroomedAlertaffect wnl  Type of Therapy: Individual Therapy  Treatment Goals addressed: Diagnosis: bipolar 1 d/o and goal 1.  Interventions: CBT and Other: boundaries and relationship skills  Summary: Kathryn [name] is a 23 y.o. female who presents with affect wnl.  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 Pt reported that she hasn't been talking w/ family and in some ways avoiding.  Pt good insight that she is upset that they weren't able to support like said.  Pt increasing awareness that she craves a supportive family and that w/ her family of origin not able to give what she would like currently.  Pt aware of impact mom's own trauma has played and that does love even if unable to show in ways pt would like. Pt discussed her want to build relationships but at same time scared to be vunerable- as  doesn't want to be abandoned.  Pt good awareness of boundaries and not focusing on just pleasing and helping others.  Pt reported mood good recently and able to manage when feeling stressed or anxious.     Suicidal/Homicidal: Nowithout intent/plan  Therapist Response: assessed pt current functioning per pt report.  Processed w/pt coping w/ interactions w/ family and relationships.  Explored avoiding.  discussed protecting w/ healthy boundaries vs. Unhealthy- ways of building healthy relationships.  Plan: Return again in 1 week, via webex.  Diagnosis: Bipolar 1 d/o and GAD  [name] [facility] 10/05/2018

## 2018-10-10 IMAGING — CT CT RENAL STONE PROTOCOL
2 of 4 series · 16 of 46 positions shown, 18 images · non-contrast
Comparison: None.

CLINICAL DATA: Abdominal pain.

EXAM:
CT ABDOMEN AND PELVIS WITHOUT CONTRAST
TECHNIQUE: Multidetector CT imaging of the abdomen and pelvis was performed
following the standard protocol without IV contrast.

[Series 2: axial st · axial · 0.94mm/px · z∈[-611,-26]mm · 13 of 127 slices shown, 15 images]
[im 5/127  soft-tissue]
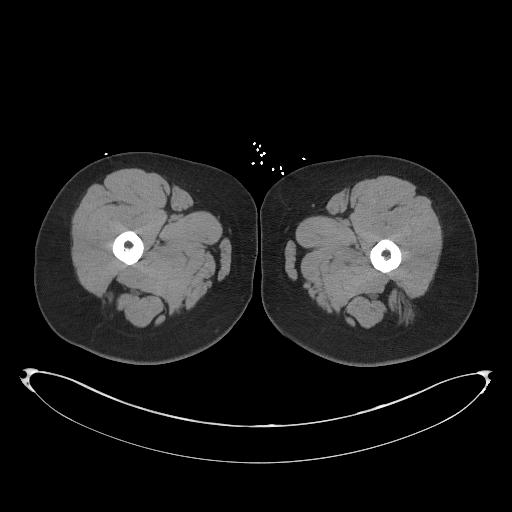
[im 5/127  bone]
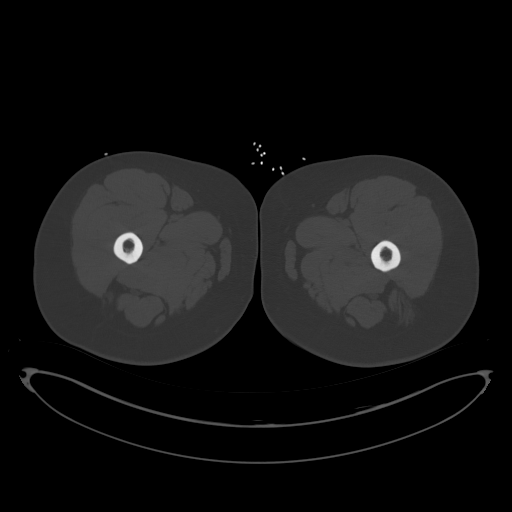
[im 15/127  soft-tissue]
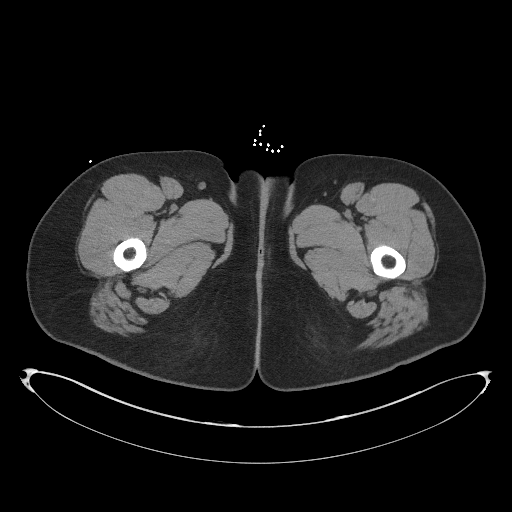
[im 25/127  soft-tissue]
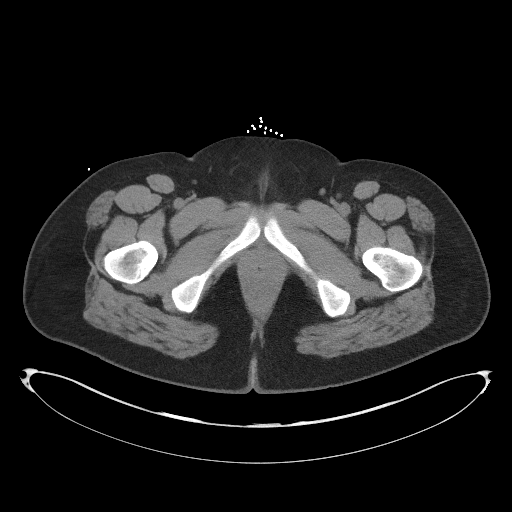
[im 34/127  soft-tissue]
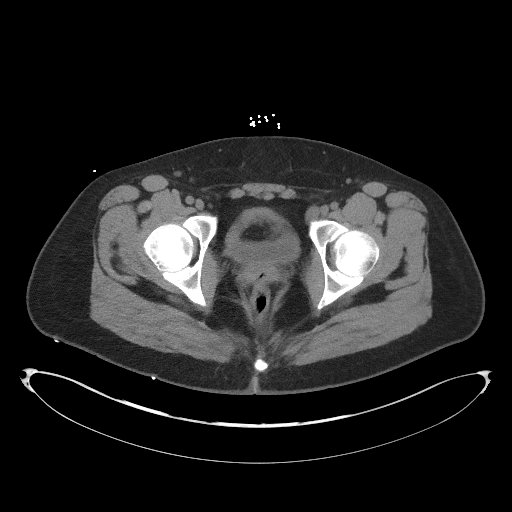
[im 44/127  soft-tissue]
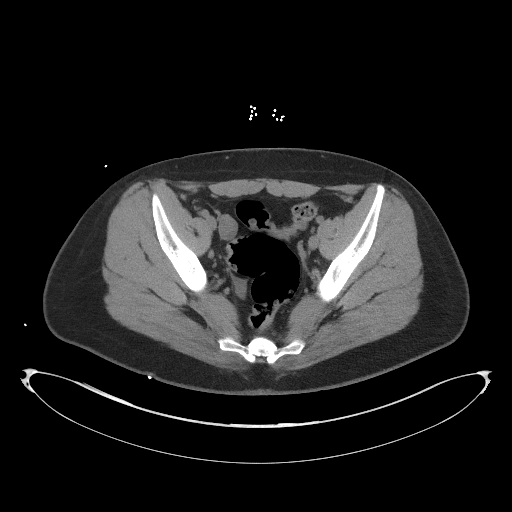
[im 54/127  soft-tissue]
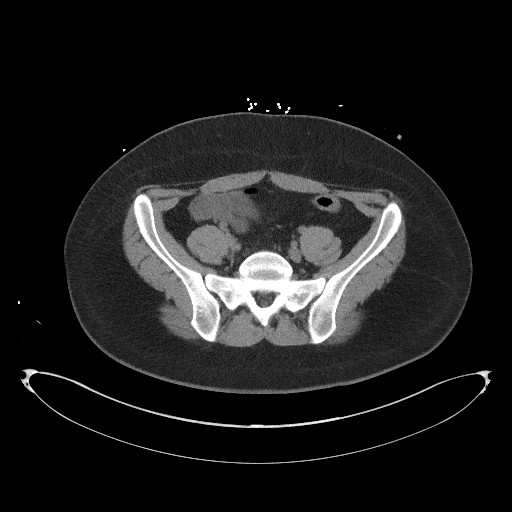
[im 63/127  soft-tissue]
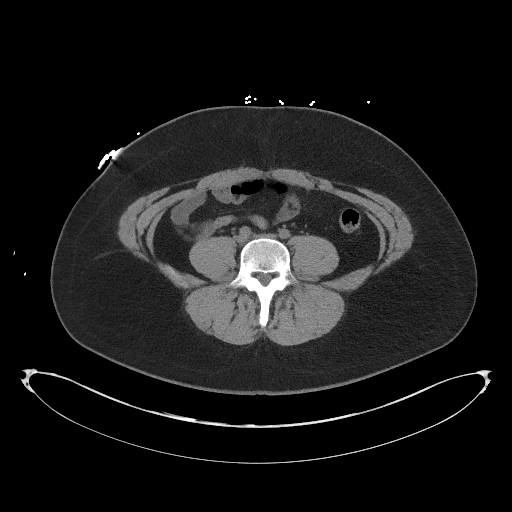
[im 73/127  soft-tissue]
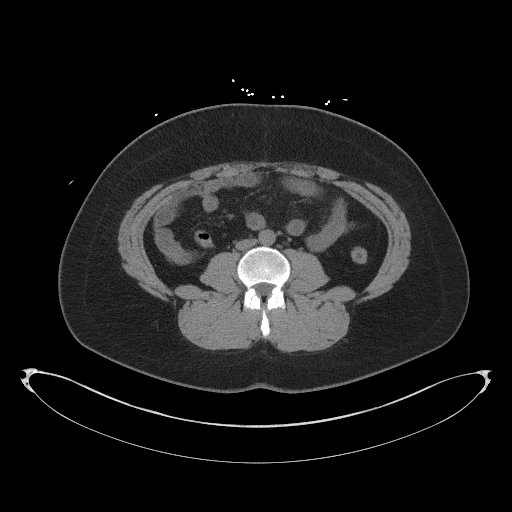
[im 83/127  soft-tissue]
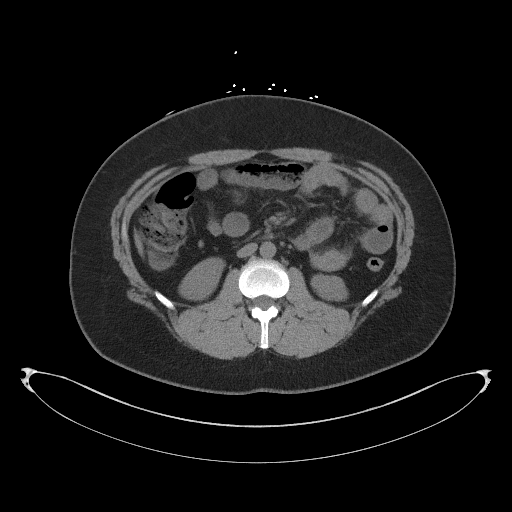
[im 83/127  bone]
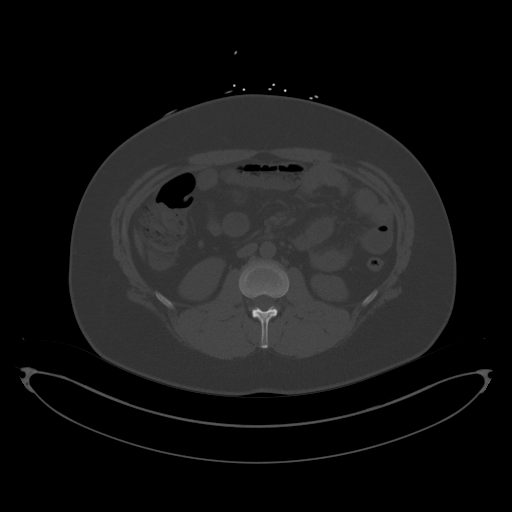
[im 93/127  soft-tissue]
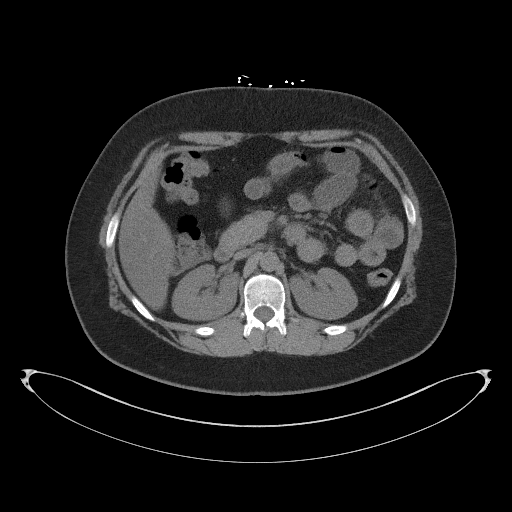
[im 102/127  soft-tissue]
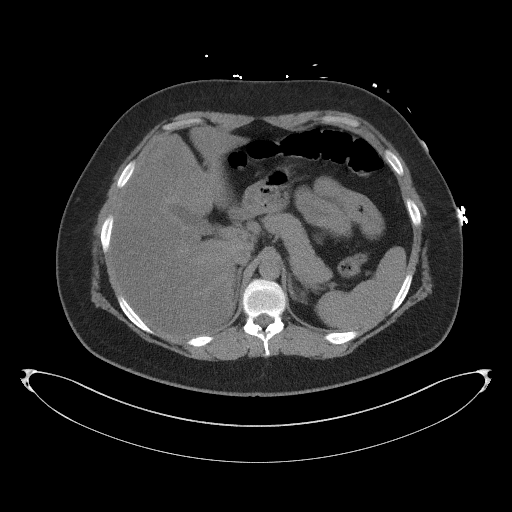
[im 112/127  soft-tissue]
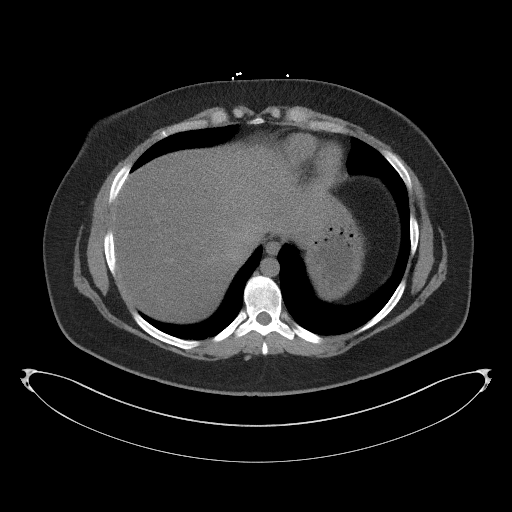
[im 122/127  soft-tissue]
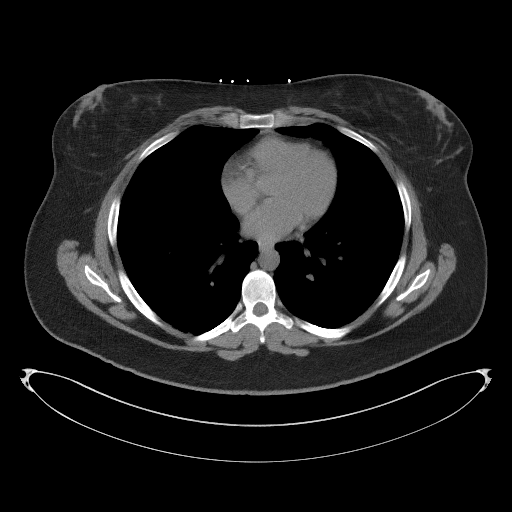

[Series 4: coronal st · coronal · 0.93mm/px · 3 of 101 slices shown]
[im 34/101  soft-tissue]
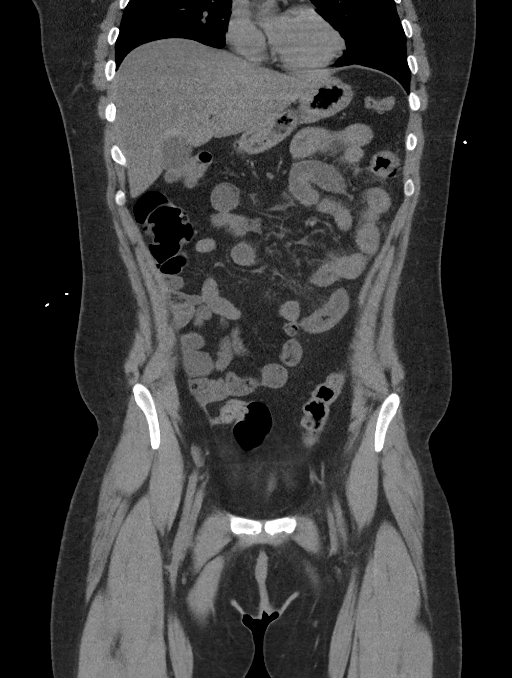
[im 45/101  soft-tissue]
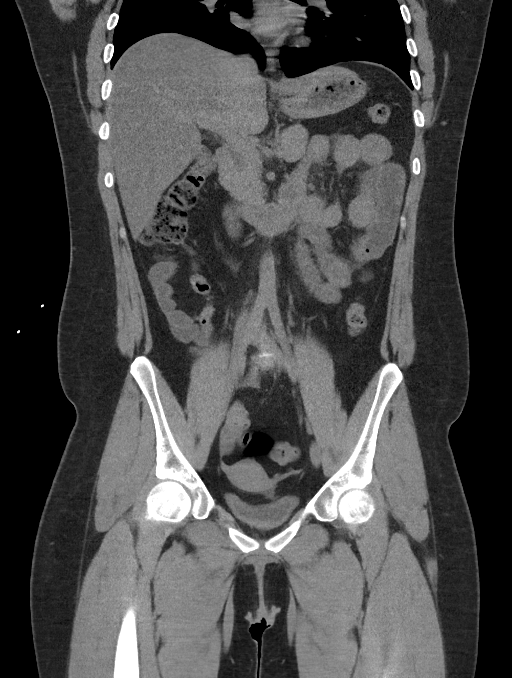
[im 56/101  soft-tissue]
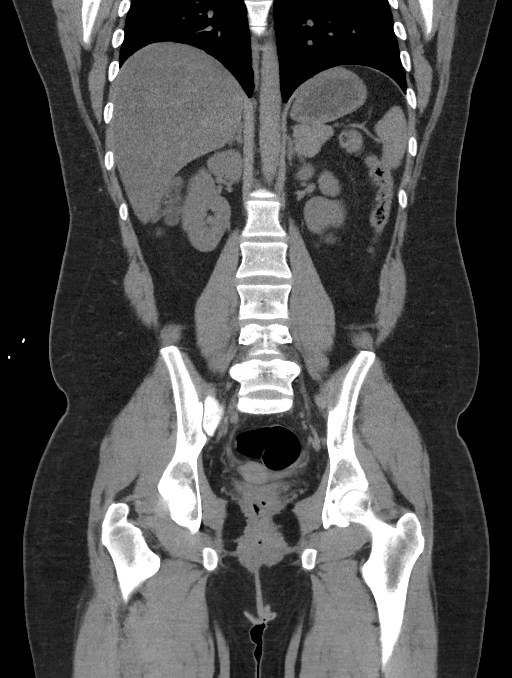

[16 of 46 positions shown; findings below may reference images not displayed]

FINDINGS: Lower chest: 5 mm nodule identified in the right middle lobe, image
4 of series 6.

Hepatobiliary: Diffuse hepatic steatosis is identified. No focal
liver abnormality. The gallbladder is normal. No biliary dilatation.

Pancreas: Unremarkable. No pancreatic ductal dilatation or
surrounding inflammatory changes.

Spleen: Normal in size without focal abnormality.

Adrenals/Urinary Tract: The adrenal glands are normal. Normal
appearance of the kidneys. No nephrolithiasis or hydronephrosis.
Urinary bladder appears normal.

Stomach/Bowel: The stomach is unremarkable. These small bowel loops
have a normal caliber. Within the limitations of unenhanced
technique there appears to be wall thickening and mild fat stranding
involving the proximal small bowel loops suggesting enteritis. No
bowel obstruction identified. The distal small bowel loops appear
within normal limits. The appendix is visualized and is normal.
Unremarkable appearance of the colon.

Vascular/Lymphatic: No significant vascular findings are present. No
enlarged abdominal or pelvic lymph nodes.

Reproductive: Uterus and bilateral adnexa are unremarkable.

Other: No abdominal wall hernia or abnormality. No abdominopelvic
ascites.

Musculoskeletal: There is no acute or significant osseous findings.
IMPRESSION: 1. Exam detail diminished secondary to lack of IV and oral contrast
material.
2. Within these limitations there appears to be wall thickening and
mild fat stranding associated with the proximal small bowel loops
suggesting enteritis.
3. Hepatic steatosis.

## 2018-10-12 ENCOUNTER — Other Ambulatory Visit: Payer: Self-pay

## 2018-10-12 ENCOUNTER — Ambulatory Visit (INDEPENDENT_AMBULATORY_CARE_PROVIDER_SITE_OTHER): Payer: 59 | Admitting: Psychology

## 2018-10-12 DIAGNOSIS — F3161 Bipolar disorder, current episode mixed, mild: Secondary | ICD-10-CM

## 2018-10-12 DIAGNOSIS — F411 Generalized anxiety disorder: Secondary | ICD-10-CM | POA: Diagnosis not present

## 2018-10-12 NOTE — Progress Notes (Signed)
Virtual Visit via Video Note  I connected with [name] on 10/12/18 at  9:00 AM EDT by a video enabled telemedicine application and verified that I am speaking with the correct person using two identifiers.   I discussed the limitations of evaluation and management by telemedicine and the availability of in person appointments. The patient expressed understanding and agreed to proceed.    I discussed the assessment and treatment plan with the patient. The patient was provided an opportunity to ask questions and all were answered. The patient agreed with the plan and demonstrated an understanding of the instructions.   The patient was advised to call back or seek an in-person evaluation if the symptoms worsen or if the condition fails to improve as anticipated.  I provided 50 minutes of non-face-to-face time during this encounter.   [name] [facility]    THERAPIST PROGRESS NOTE  Session Time: 9am-9.50am  Participation Level: Active  Behavioral Response: Well GroomedAlertaffect wnl  Type of Therapy: Individual Therapy  Treatment Goals addressed: Diagnosis: bipolar 1 d/o and goal 1.  Interventions: CBT and Supportive  Summary: Renn Sitler is a 23 y.o. female who presents with affect wnl.  pt reported she has a headache that carried over from last night- but is off today so can take it easy.  Pt reported that work is going well and nice to have boss express appreciation for her.  Pt reported that she hasn't talk w/ dad and doesn't feel bothered by this and in some ways wants to not work on relationship w/ him as takes so much energy and feels negative.  Pt reports she has talked w/ mom and sister and has been positive.  Pt reported that she has had interaction w/ friend but is aware she is feeling fearful of this relationship breaking apart.  Pt discussed how over thinks things and assumes that she will be hurt by others and how that negatively can impact relationship. Pt is able to  reframe as well and focus on keeping healthy boundaries and time to see that person's patterns and whether good for her or not.   Suicidal/Homicidal: Nowithout intent/plan  Therapist Response: Assessed pt current functioning per pt report.  Processed w/pt her relationships- relationship building- fear of being hurt in friendships and other relationships and ways of building healthy relaitonships.  Assisted pt in refaming negative distortions.  Plan: Return again in 1 weeks, via webex.  Diagnosis: Bipolar 1 d/o   [name] [facility] 10/12/2018

## 2018-10-19 ENCOUNTER — Other Ambulatory Visit: Payer: Self-pay

## 2018-10-19 ENCOUNTER — Ambulatory Visit (INDEPENDENT_AMBULATORY_CARE_PROVIDER_SITE_OTHER): Payer: 59 | Admitting: Psychology

## 2018-10-19 DIAGNOSIS — F3161 Bipolar disorder, current episode mixed, mild: Secondary | ICD-10-CM

## 2018-10-19 DIAGNOSIS — F411 Generalized anxiety disorder: Secondary | ICD-10-CM
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
## 2018-10-26 ENCOUNTER — Ambulatory Visit (INDEPENDENT_AMBULATORY_CARE_PROVIDER_SITE_OTHER): Payer: 59 | Admitting: Psychology

## 2018-10-26 ENCOUNTER — Other Ambulatory Visit: Payer: Self-pay

## 2018-10-26 DIAGNOSIS — F3161 Bipolar disorder, current episode mixed, mild: Secondary | ICD-10-CM | POA: Diagnosis not present

## 2018-10-26 NOTE — Progress Notes (Signed)
Virtual Visit via Video Note  I connected with Kathryn [name] on 10/26/18 at  9:00 AM EDT by a video enabled telemedicine application and verified that I am speaking with the correct person using two identifiers.   I discussed the limitations of evaluation and management by telemedicine and the availability of in person appointments. The patient expressed understanding and agreed to proceed.    I discussed the assessment and treatment plan with the patient. The patient was provided an opportunity to ask questions and all were answered. The patient agreed with the plan and demonstrated an understanding of the instructions.   The patient was advised to call back or seek an in-person evaluation if the symptoms worsen or if the condition fails to improve as anticipated.  I provided 27 minutes of non-face-to-face time during this encounter.   [name] [facility]    THERAPIST PROGRESS NOTE  Session Time: 9.01am-9.28am  Participation Level: Active  Behavioral Response: Well GroomedAlertaffect wnl  Type of Therapy: Individual Therapy  Treatment Goals addressed: Diagnosis: Bipolar 1 d/o and goal 1.  Interventions: CBT and Supportive  Summary: Kathryn [name] is a 23 y.o. female who presents with affect wnl.  pt reported that she has been up since 5am as dog woke her.  Pt reported her day off and nothing has to do so looking forward to chilling.  Pt reported that she has been busy w/ work and feeling tired out after work.  Pt reported mood not down.  Pt reported nothing really to talk about, nothing much on her mind, nothing very stressful.  Pt reported hasn't heard from dad and doesn't expect response from post. Pt reports she has been worried that if makes payment to him for car he will contact and doesn't know what she will say.  Pt acknowledged she can decided if responds to contact or not.  Pt reported hasn't talked to mom or sister in past week and feels some guilt about- but is able to  reframe not on purpose, or avoiding or her responsibility to take care of their feelings.  Pt did reach out to sister in text offering to talk after post that concerned her.  Pt discussed that new friend had stopped communicating other than a couple random snapchats in past week.  Pt is wondering what changed but acknowledges that pushing to find out won't benefit relationship and not likely something "she did'.  Pt is keeping good boundary and practicing accepting. .   Suicidal/Homicidal: Nowithout intent/plan  Therapist Response: Assessed pt current functioning per pt report.  Processed w/ pt mood, relationships and communication.  Explored w/ pt keeping healthy boundaries and healthy communication in relationships she is fostering.  Plan: Return again in 1 weeks, via webex.  Diagnosis: Bipolar 1 d/o [name] [facility] 10/26/2018

## 2018-11-02 ENCOUNTER — Ambulatory Visit (HOSPITAL_COMMUNITY): Payer: 59 | Admitting: Psychology

## 2018-11-02 ENCOUNTER — Other Ambulatory Visit: Payer: Self-pay

## 2018-11-02 ENCOUNTER — Encounter (HOSPITAL_COMMUNITY): Payer: Self-pay | Admitting: Psychology

## 2018-11-02 NOTE — Progress Notes (Signed)
Kathryn [name] is a 23 y.o. female patient who cancelled her appointment for today informing has a migraine.        [name], [facility]

## 2018-11-09 ENCOUNTER — Encounter: Payer: Self-pay | Admitting: Family Medicine

## 2018-11-09 ENCOUNTER — Ambulatory Visit (INDEPENDENT_AMBULATORY_CARE_PROVIDER_SITE_OTHER): Payer: 59 | Admitting: Psychology

## 2018-11-09 ENCOUNTER — Other Ambulatory Visit: Payer: Self-pay

## 2018-11-09 ENCOUNTER — Other Ambulatory Visit: Payer: Self-pay | Admitting: Family Medicine

## 2018-11-09 DIAGNOSIS — F3161 Bipolar disorder, current episode mixed, mild: Secondary | ICD-10-CM | POA: Diagnosis not present

## 2018-11-09 DIAGNOSIS — F411 Generalized anxiety disorder: Secondary | ICD-10-CM

## 2018-11-09 DIAGNOSIS — N921 Excessive and frequent menstruation with irregular cycle: Secondary | ICD-10-CM

## 2018-11-09 DIAGNOSIS — E282 Polycystic ovarian syndrome: Secondary | ICD-10-CM
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
## 2018-11-11 ENCOUNTER — Encounter: Payer: Self-pay | Admitting: Family Medicine

## 2018-11-11 DIAGNOSIS — E785 Hyperlipidemia, unspecified: Secondary | ICD-10-CM

## 2018-11-11 DIAGNOSIS — E282 Polycystic ovarian syndrome: Secondary | ICD-10-CM

## 2018-11-11 DIAGNOSIS — I10 Essential (primary) hypertension: Secondary | ICD-10-CM

## 2018-11-11 DIAGNOSIS — E782 Mixed hyperlipidemia: Secondary | ICD-10-CM

## 2018-11-11 DIAGNOSIS — R739 Hyperglycemia, unspecified: Secondary | ICD-10-CM

## 2018-11-16 ENCOUNTER — Other Ambulatory Visit: Payer: Self-pay

## 2018-11-16 ENCOUNTER — Ambulatory Visit (INDEPENDENT_AMBULATORY_CARE_PROVIDER_SITE_OTHER): Payer: 59 | Admitting: Psychology

## 2018-11-16 DIAGNOSIS — F3161 Bipolar disorder, current episode mixed, mild: Secondary | ICD-10-CM

## 2018-11-16 NOTE — Progress Notes (Signed)
Virtual Visit via Video Note  I connected with [name] on 11/16/18 at  9:00 AM EDT by a video enabled telemedicine application and verified that I am speaking with the correct person using two identifiers.   I discussed the limitations of evaluation and management by telemedicine and the availability of in person appointments. The patient expressed understanding and agreed to proceed.    I discussed the assessment and treatment plan with the patient. The patient was provided an opportunity to ask questions and all were answered. The patient agreed with the plan and demonstrated an understanding of the instructions.   The patient was advised to call back or seek an in-person evaluation if the symptoms worsen or if the condition fails to improve as anticipated.  I provided 42 minutes of non-face-to-face time during this encounter.   [name] [facility]    THERAPIST PROGRESS NOTE  Session Time: 9am-9.42am  Participation Level: Active  Behavioral Response: Well GroomedAlertaffect wnl.    Type of Therapy: Individual Therapy  Treatment Goals addressed: Diagnosis: Bipolar 1 d/o and goal 1  Interventions: CBT and Supportive  Summary: Naeli Geis is a 23 y.o. female who presents with affect wnl.  pt reported her morning has started ok- day off and going to enjoy relaxing and couple of errands.  Pt reported that she visited home over weekend and this was positive and enjoyed.  Pt reported that she did have increased anxiety yesterday and good insight into potential contributing factors as tends to have after returning from home visit.  Pt acknowledges tends to worry about family and where she wants to be living. Pt reports she was able to reframe and was able to advocate for break at work when felt overwhelmed.  Pt reported that she isn't getting her full time hours at work and so looking for 2nd job to supplement.  Pt aware that finances are impacting her meeting her goals to move back  home.  Pt aware that she isn't budgeting and managing money the best although pays all her bills.  Sister has offered to help w/ learning this skill and pt is making this a goal for self.  Pt discussed that she still feels off as not able to feel joy- like mood has been blunted too much.  Pt plans to talk to psychiatrist about- but also worries- doesn't want to "mess w/ meds" and things get worse.  Pt also reports sometimes feels so tired that can't stay awake in afternoon.  Suicidal/Homicidal: Nowithout intent/plan  Therapist Response: Assessed pt current functioning per pt report.  Processed w/pt coping w/ anxiety, reflected how used coping skills and good insight into contributing factors.  Discussed her goals and barriers of working towards- financial and things she can look at/suppports to manage better.  Encouraged pt to have open discussion w/ psychiatrist to explore potential effects/options.  Plan: Return again in 1 week via webex.  Diagnosis: Bipolar 1 d/o.     [name] [facility] 11/16/2018

## 2018-11-17 ENCOUNTER — Other Ambulatory Visit (INDEPENDENT_AMBULATORY_CARE_PROVIDER_SITE_OTHER): Payer: Managed Care, Other (non HMO)

## 2018-11-17 ENCOUNTER — Other Ambulatory Visit: Payer: Self-pay

## 2018-11-17 DIAGNOSIS — E282 Polycystic ovarian syndrome: Secondary | ICD-10-CM

## 2018-11-17 DIAGNOSIS — R739 Hyperglycemia, unspecified: Secondary | ICD-10-CM

## 2018-11-17 DIAGNOSIS — I10 Essential (primary) hypertension: Secondary | ICD-10-CM

## 2018-11-17 DIAGNOSIS — E782 Mixed hyperlipidemia: Secondary | ICD-10-CM

## 2018-11-17 NOTE — Addendum Note (Signed)
Addended by: Kelle Darting A on: 11/17/2018 03:03 PM   Modules accepted: Orders

## 2018-11-21 LAB — LIPID PANEL
Chol/HDL Ratio: 4.6 ratio — ABNORMAL HIGH (ref 0.0–4.4)
Cholesterol, Total: 223 mg/dL — ABNORMAL HIGH (ref 100–199)
HDL: 48 mg/dL (ref >39)
LDL Chol Calc (NIH): 166 mg/dL — ABNORMAL HIGH (ref 0–99)
Triglycerides: 55 mg/dL (ref 0–149)
VLDL Cholesterol Cal: 9 mg/dL (ref 5–40)

## 2018-11-21 LAB — COMPREHENSIVE METABOLIC PANEL
ALT: 17 IU/L (ref 0–32)
AST: 23 IU/L (ref 0–40)
Albumin/Globulin Ratio: 2.4 — ABNORMAL HIGH (ref 1.2–2.2)
Albumin: 4.5 g/dL (ref 3.9–5.0)
Alkaline Phosphatase: 62 IU/L (ref 39–117)
BUN/Creatinine Ratio: 6 — ABNORMAL LOW (ref 9–23)
BUN: 5 mg/dL — ABNORMAL LOW (ref 6–20)
Bilirubin Total: 0.4 mg/dL (ref 0.0–1.2)
CO2: 21 mmol/L (ref 20–29)
Calcium: 9.3 mg/dL (ref 8.7–10.2)
Chloride: 102 mmol/L (ref 96–106)
Creatinine, Ser: 0.79 mg/dL (ref 0.57–1.00)
GFR calc Af Amer: 122 mL/min/{1.73_m2} (ref >59)
GFR calc non Af Amer: 106 mL/min/{1.73_m2} (ref >59)
Globulin, Total: 1.9 g/dL (ref 1.5–4.5)
Glucose: 76 mg/dL (ref 65–99)
Potassium: 4 mmol/L (ref 3.5–5.2)
Sodium: 136 mmol/L (ref 134–144)
Total Protein: 6.4 g/dL (ref 6.0–8.5)

## 2018-11-21 LAB — CBC
Hematocrit: 38.8 % (ref 34.0–46.6)
Hemoglobin: 13.2 g/dL (ref 11.1–15.9)
MCH: 34.1 pg — ABNORMAL HIGH (ref 26.6–33.0)
MCHC: 34 g/dL (ref 31.5–35.7)
MCV: 100 fL — ABNORMAL HIGH (ref 79–97)
Platelets: 387 10*3/uL (ref 150–450)
RBC: 3.87 x10E6/uL (ref 3.77–5.28)
RDW: 12.8 % (ref 11.7–15.4)
WBC: 10.5 10*3/uL (ref 3.4–10.8)

## 2018-11-21 LAB — FERRITIN: Ferritin: 63 ng/mL (ref 15–150)

## 2018-11-21 LAB — T4, FREE: Free T4: 1.34 ng/dL (ref 0.82–1.77)

## 2018-11-21 LAB — HEMOGLOBIN A1C
Est. average glucose Bld gHb Est-mCnc: 103 mg/dL
Hgb A1c MFr Bld: 5.2 % (ref 4.8–5.6)

## 2018-11-21 LAB — TSH: TSH: 0.735 u[IU]/mL (ref 0.450–4.500)

## 2018-11-23 ENCOUNTER — Ambulatory Visit (INDEPENDENT_AMBULATORY_CARE_PROVIDER_SITE_OTHER): Payer: 59 | Admitting: Psychology

## 2018-11-23 ENCOUNTER — Other Ambulatory Visit: Payer: Self-pay

## 2018-11-23 DIAGNOSIS — F411 Generalized anxiety disorder: Secondary | ICD-10-CM

## 2018-11-23 DIAGNOSIS — F3161 Bipolar disorder, current episode mixed, mild: Secondary | ICD-10-CM

## 2018-11-23 NOTE — Progress Notes (Signed)
Virtual Visit via Video Note  I connected with Kathryn [name] on 11/23/18 at  9:00 AM EDT by a video enabled telemedicine application and verified that I am speaking with the correct person using two identifiers.   I discussed the limitations of evaluation and management by telemedicine and the availability of in person appointments. The patient expressed understanding and agreed to proceed.   I discussed the assessment and treatment plan with the patient. The patient was provided an opportunity to ask questions and all were answered. The patient agreed with the plan and demonstrated an understanding of the instructions.   The patient was advised to call back or seek an in-person evaluation if the symptoms worsen or if the condition fails to improve as anticipated.  I provided 34 minutes of non-face-to-face time during this encounter.   [name] [facility]    THERAPIST PROGRESS NOTE  Session Time: 9am-9.34am  Participation Level: Active  Behavioral Response: Well GroomedAlertaffect wnl  Type of Therapy: Individual Therapy  Treatment Goals addressed: Diagnosis: Bipolar 1 d/o and goal 1.  Interventions: CBT  Summary: Kathryn [name] is a 23 y.o. female who presents with affect wnl.  pt reported that anxiety is improved.  Pt reported that she has been able to enjoy some social visits and talk w/ her grandmother.  Pt reported she went to PCP and blood work completed looked good. Pt reported that she still is feeling exhausted in afternoon. Pt reported that she applied for 2nd job as still not getting enough hours at work. Pt discussed no major stressors in past week and has enjoyed some alone time as well- looking for book to read to change up from default of social media scrolling.   Suicidal/Homicidal: Nowithout intent/plan  Therapist Response: Assessed pt current functioning per pt report. Processed w/pt mood and being intentional about interactions and exploring things to enjoy.   Discussed positive interactions w/ other face to face and over phone.  Plan: Return again in 1 weeks, via webex. F/u as scheduled w/ dr. Montel Culver.  Diagnosis: Bipolar 1 d/o  [name] [facility] 11/23/2018

## 2018-11-28 ENCOUNTER — Other Ambulatory Visit: Payer: Self-pay

## 2018-11-28 ENCOUNTER — Ambulatory Visit (INDEPENDENT_AMBULATORY_CARE_PROVIDER_SITE_OTHER): Payer: 59 | Admitting: Psychiatry

## 2018-11-28 DIAGNOSIS — F988 Other specified behavioral and emotional disorders with onset usually occurring in childhood and adolescence: Secondary | ICD-10-CM | POA: Diagnosis not present

## 2018-11-28 DIAGNOSIS — F3161 Bipolar disorder, current episode mixed, mild: Secondary | ICD-10-CM

## 2018-11-28 DIAGNOSIS — F900 Attention-deficit hyperactivity disorder, predominantly inattentive type: Secondary | ICD-10-CM | POA: Diagnosis not present

## 2018-11-28 DIAGNOSIS — F411 Generalized anxiety disorder: Secondary | ICD-10-CM | POA: Diagnosis not present

## 2018-11-28 MED ORDER — BUSPIRONE HCL 15 MG PO TABS: 15.0000 mg | ORAL_TABLET | Freq: Two times a day (BID) | ORAL | 0 refills | Status: AC

## 2018-11-28 MED ORDER — ZOLPIDEM TARTRATE 10 MG PO TABS: 10.0000 mg | ORAL_TABLET | Freq: Every evening | ORAL | 2 refills | Status: DC | PRN

## 2018-11-28 MED ORDER — LAMOTRIGINE 150 MG PO TABS: 150.0000 mg | ORAL_TABLET | Freq: Every day | ORAL | 0 refills | Status: DC

## 2018-11-28 MED ORDER — AMPHETAMINE-DEXTROAMPHETAMINE 15 MG PO TABS: 15.0000 mg | ORAL_TABLET | Freq: Two times a day (BID) | ORAL | 0 refills | Status: DC

## 2018-11-28 MED ORDER — VENLAFAXINE HCL ER 75 MG PO CP24: 75.0000 mg | ORAL_CAPSULE | Freq: Every day | ORAL | 0 refills | Status: DC

## 2018-11-28 NOTE — Progress Notes (Signed)
BH MD/PA/NP OP Progress Note  11/28/2018 10:13 AM Lynetta Seppi  MRN:  [id number] Interview was conducted by phone and I verified that I was speaking with the correct person using two identifiers. I discussed the limitations of evaluation and management by telemedicine and  the availability of in person appointments. Patient expressed understanding and agreed to proceed.  Chief Complaint: "I am doing fine".  HPI: 23 yo single white female with bipolar 1 disorder, ADHD and GAD (also cannabis use disorder). She has been on venlafaxine/buspirone for a long time for anxiety/depression. Dr. De Nurse added lamotrigine for mood stabilization and she has been recently taking 150 mg ay bedtime. She also takes Adderall 15 mg bid for ADD. In late May Layla started feeling more depressed and admitted to having passive SI. She entered IOP on 6/620 and completed it on 08/06/18. Her mood improved and she denies having any further SI. Sleep is good when she takes 10 mg of zolpidem. She has no hx of psychotic sx. In the past she tried XR for of Adderall but did not judge it to be effective hence switch to IR which works much better. Dyanara reports on occasion feeling very sleepy in mid afternoon (3-4 PM) but as it does not appear daily it is not likely related to "Adderall crash". Arlynn has a hx of cannabis abuse and at this time she smokes less frequently. She works at [employer].    Visit Diagnosis:    ICD-10-CM   1. Attention deficit hyperactivity disorder (ADHD), predominantly inattentive type  F90.0   2. Attention deficit disorder, unspecified hyperactivity presence  F98.8 amphetamine-dextroamphetamine (ADDERALL) 15 MG tablet    amphetamine-dextroamphetamine (ADDERALL) 15 MG tablet    amphetamine-dextroamphetamine (ADDERALL) 15 MG tablet  3. Bipolar 1 disorder, mixed, mild (HCC)  F31.61 busPIRone (BUSPAR) 15 MG tablet  4. GAD (generalized anxiety disorder)  F41.1     Past Psychiatric History: Please see intake  H&P.  Past Medical History:  Past Medical History:  Diagnosis Date  . Abnormal liver function tests 02/02/2016  . ADHD 06/10/2016  . Alcohol use 02/23/2016  . Allergy   . Anxiety   . Arm fracture, right    as child, fx in three places, no surgery  . Depression   . Depression with anxiety 13-Apr-2014   PTSD s/p death of best friend   . Essential hypertension 02/02/2016  . GERD (gastroesophageal reflux disease)   . 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 Headache 04/09/2016  . Hyperlipidemia, mixed 02/02/2016  . Migraine   . Ovarian cyst   . PCOS (polycystic ovarian syndrome)   . Preventative health care 10/27/2015  . Thrombocytosis ([place]) 04/09/2016    Past Surgical History:  Procedure Laterality Date  . ADENOIDECTOMY  2002  . TYMPANOSTOMY TUBE PLACEMENT Bilateral 1999    Family Psychiatric History: Reviewed.  Family History:  Family History  Problem Relation Age of Onset  . Hypertension Mother   . Heart disease Mother        CAD, stent at 68, s/p cardiac arrest with Defib  . Hyperlipidemia Mother   . Diabetes Mother        s/p gastric bypass  . Obesity Mother        s/p gastric bypass  . OCD Mother   . Hyperlipidemia Father   . Hypertension Father   . Anxiety disorder Father   . Asthma Sister   . Hyperlipidemia Brother   . Anxiety disorder Brother   . Stroke Maternal Grandmother   .  Thyroid disease Maternal Grandmother   . Anxiety disorder Maternal Grandmother   . Heart disease Maternal Grandfather        MI first at 67, s/p stents and CABG  . Hyperlipidemia Maternal Grandfather   . Hypertension Maternal Grandfather   . Stroke Maternal Grandfather   . Cancer Maternal Grandfather        melanoma  . Aneurysm Maternal Grandfather        brain  . Kidney disease Maternal Grandfather        tumor  . Heart disease Paternal Grandmother   . Cancer Paternal Lucilla Edin and brain  . Heart disease Maternal Uncle   . Vision loss Maternal Uncle   . Anxiety disorder  Maternal Uncle   . Anxiety disorder Maternal Aunt   . Depression Maternal Aunt     Social History:  Social History   Socioeconomic History  . Marital status: Single    Spouse name: Not on file  . Number of children: 0  . Years of education: 34  . Highest education level: Not on file  Occupational History    Comment: [employer]  Social Needs  . Financial resource strain: Not on file  . Food insecurity    Worry: Not on file    Inability: Not on file  . Transportation needs    Medical: Not on file    Non-medical: Not on file  Tobacco Use  . Smoking status: Heavy Tobacco Smoker    Packs/day: 1.50    Types: Cigarettes  . Smokeless tobacco: Current User    Types: Snuff  Substance and Sexual Activity  . Alcohol use: No    Alcohol/week: 0.0 standard drinks    Comment: maybe every 6 months  . Drug use: Yes    Types: Marijuana    Comment: daily  . Sexual activity: Yes    Birth control/protection: Pill  Lifestyle  . Physical activity    Days per week: Not on file    Minutes per session: Not on file  . Stress: Not on file  Relationships  . Social Herbalist on phone: Not on file    Gets together: Not on file    Attends religious service: Not on file    Active member of club or organization: Not on file    Attends meetings of clubs or organizations: Not on file    Relationship status: Not on file  Other Topics Concern  . Not on file  Social History Narrative  . Not on file    Allergies:  Allergies  Allergen Reactions  . Oxycodone Nausea Only    Oxycontin also  . Seroquel [Quetiapine Fumarate]     Mental status changes    Metabolic Disorder Labs: Lab Results  Component Value Date   HGBA1C 5.2 11/17/2018   No results found for: PROLACTIN Lab Results  Component Value Date   CHOL 223 (H) 11/17/2018   TRIG 55 11/17/2018   HDL 48 11/17/2018   CHOLHDL 4.6 (H) 11/17/2018   VLDL 21.4 05/05/2017   LDLCALC 166 (H) 11/17/2018   LDLCALC 155 (H)  05/05/2017   Lab Results  Component Value Date   TSH 0.735 11/17/2018   TSH 2.49 05/05/2017    Therapeutic Level Labs: No results found for: LITHIUM No results found for: VALPROATE No components found for:  CBMZ  Current Medications: Current Outpatient Medications  Medication Sig Dispense Refill  . albuterol (PROVENTIL HFA;VENTOLIN  HFA) 108 (90 Base) MCG/ACT inhaler Inhale 2 puffs into the lungs every 6 (six) hours as needed for wheezing or shortness of breath. 3 Inhaler 0  . [START ON 02/15/2019] amphetamine-dextroamphetamine (ADDERALL) 15 MG tablet Take 1 tablet by mouth 2 (two) times daily with a meal. August 2020 60 tablet 0  . [START ON 01/16/2019] amphetamine-dextroamphetamine (ADDERALL) 15 MG tablet Take 1 tablet by mouth 2 (two) times daily with a meal. July 2020 60 tablet 0  . [START ON 12/16/2018] amphetamine-dextroamphetamine (ADDERALL) 15 MG tablet Take 1 tablet by mouth 2 (two) times daily with a meal. June 2020 60 tablet 0  . busPIRone (BUSPAR) 15 MG tablet Take 1 tablet (15 mg total) by mouth 2 (two) times daily. 180 tablet 0  . famotidine (PEPCID) 20 MG tablet Take 1 tablet (20 mg total) by mouth 2 (two) times daily. 180 tablet 0  . gabapentin (NEURONTIN) 300 MG capsule Take 1 capsule (300 mg total) by mouth 3 (three) times daily. 270 capsule 3  . lamoTRIgine (LAMICTAL) 150 MG tablet Take 1 tablet (150 mg total) by mouth at bedtime. Take one a day 90 tablet 0  . Levonorgestrel-Ethinyl Estradiol (AMETHIA) 0.15-0.03 &0.01 MG tablet Take 1 tablet by mouth daily. 91 tablet 1  . mometasone-formoterol (DULERA) 100-5 MCG/ACT AERO Inhale 2 puffs into the lungs 2 (two) times daily. 3 Inhaler 1  . montelukast (SINGULAIR) 10 MG tablet Take 1 tablet (10 mg total) by mouth at bedtime. 90 tablet 1  . omeprazole (PRILOSEC) 40 MG capsule Take 1 capsule (40 mg total) by mouth 2 (two) times daily. 180 capsule 3  . ondansetron (ZOFRAN) 4 MG tablet Take 1 tablet (4 mg total) by mouth every 8  (eight) hours as needed for nausea or vomiting. 20 tablet 0  . valACYclovir (VALTREX) 1000 MG tablet Take 1 tablet (1,000 mg total) by mouth 2 (two) times daily. 180 tablet 1  . venlafaxine XR (EFFEXOR-XR) 75 MG 24 hr capsule Take 1 capsule (75 mg total) by mouth daily with breakfast. 90 capsule 0  . zolpidem (AMBIEN) 10 MG tablet Take 1 tablet (10 mg total) by mouth at bedtime as needed for sleep. 30 tablet 2   No current facility-administered medications for this visit.      Psychiatric Specialty Exam: Review of Systems  Psychiatric/Behavioral: The patient is nervous/anxious.   All other systems reviewed and are negative.   There were no vitals taken for this visit.There is no height or weight on file to calculate BMI.  General Appearance: NA  Eye Contact:  NA  Speech:  Clear and Coherent and Normal Rate  Volume:  Normal  Mood:  Mild anxiety  Affect:  NA  Thought Process:  Goal Directed and Linear  Orientation:  Full (Time, Place, and Person)  Thought Content: Logical   Suicidal Thoughts:  No  Homicidal Thoughts:  No  Memory:  Immediate;   Good Recent;   Good Remote;   Good  Judgement:  Good  Insight:  Good  Psychomotor Activity:  NA  Concentration:  Concentration: Good  Recall:  Good  Fund of Knowledge: Good  Language: Good  Akathisia:  Negative  Handed:  Right  AIMS (if indicated): not done  Assets:  Communication Skills Desire for Improvement Financial Resources/Insurance Housing Social Support Vocational/Educational  ADL's:  Intact  Cognition: WNL  Sleep:  Good   Screenings: PHQ2-9     Office Visit from 08/22/2015 in [facility]  PHQ-2 Total Score  5  PHQ-9 Total Score  19       Assessment and Plan: 23 yo single white female with bipolar 1 disorder, ADHD and GAD (also cannabis use disorder). She has been on venlafaxine/buspirone for a long time for anxiety/depression. Dr. De Nurse added lamotrigine for mood  stabilization and she has been recently taking 150 mg ay bedtime. She also takes Adderall 15 mg bid for ADD. In late May Marcelyn started feeling more depressed and admitted to having passive SI. She entered IOP on 6/620 and completed it on 08/06/18. Her mood improved and she denies having any further SI. Sleep is good when she takes 10 mg of zolpidem. She has no hx of psychotic sx. In the past she tried XR for of Adderall but did not judge it to be effective hence switch to IR which works much better. Shameika reports on occasion feeling very sleepy in mid afternoon (3-4 PM) but as it does not appear daily it is not likely related to "Adderall crash". Paquita has a hx of cannabis abuse and at this time she smokes less frequently. She continues counseling with [name]. Works at [employer].  Plan: We will continue Lamictal to 150 mg at HS, buspirone 15 mg bid, Effexor xr 75 mg daily, Ambien to 10 mg prn sleep (does not use it nightly) and Adderall IR 15 mg bid.  The plan was discussed with patient who had an opportunity to ask questions and these were all answered. I spend 37minutes in indirect clinical contact with the patient      Stephanie Acre, MD 11/28/2018, 10:13 AM

## 2018-11-30 ENCOUNTER — Ambulatory Visit (INDEPENDENT_AMBULATORY_CARE_PROVIDER_SITE_OTHER): Payer: 59 | Admitting: Psychology

## 2018-11-30 ENCOUNTER — Other Ambulatory Visit: Payer: Self-pay

## 2018-11-30 DIAGNOSIS — F3161 Bipolar disorder, current episode mixed, mild: Secondary | ICD-10-CM | POA: Diagnosis not present

## 2018-11-30 NOTE — Progress Notes (Signed)
Virtual Visit via Video Note  I connected with Kathryn [name] on 11/30/18 at  9:00 AM EDT by a video enabled telemedicine application and verified that I am speaking with the correct person using two identifiers.   I discussed the limitations of evaluation and management by telemedicine and the availability of in person appointments. The patient expressed understanding and agreed to proceed.   I discussed the assessment and treatment plan with the patient. The patient was provided an opportunity to ask questions and all were answered. The patient agreed with the plan and demonstrated an understanding of the instructions.   The patient was advised to call back or seek an in-person evaluation if the symptoms worsen or if the condition fails to improve as anticipated.  I provided 28 minutes of non-face-to-face time during this encounter.   [name] [facility]    THERAPIST PROGRESS NOTE  Session Time: 9am-9.28am  Participation Level: Active  Behavioral Response: Well GroomedAlertaffect wnl  Type of Therapy: Individual Therapy  Treatment Goals addressed: Diagnosis: Bipolar 1 d/o and goal 1.  Interventions: CBT and Strength-based  Summary: Kathryn [name] is a 23 y.o. female who presents with affect bright. Pt reported that she has been doing well w/ mood.  Pt reported that she was able to get more hours this past week at work and that was positive.  Pt reported she has been having time w/ friends in the past week.  Pt reported talking w/ Dr. Montel Culver and no med changes.  Pt discussed increased activity physically in afternoon to see if assist w/ increasing energy.  Pt reported that she is doing well w/ family interaction- not feeling guilty and was able to practice saying no this past week.   Suicidal/Homicidal: Nowithout intent/plan  Therapist Response: Assessed pt current functioning per pt report.  Processed w/pt coping w/ interactions, moods and energy crash in the afternoons.   Explored w/pt ways of increasing physical activity to see effects on energy level.  Reflected positives.  Discussed reducing frequency w/ appointments since maintaining w/ improvements.   Plan: Return again in 2 weeks, via webex.  Diagnosis: Bipolar 1 d/o    [name] [facility] 11/30/2018

## 2018-12-07 ENCOUNTER — Encounter: Payer: Self-pay | Admitting: Family Medicine

## 2018-12-07 ENCOUNTER — Other Ambulatory Visit: Payer: Self-pay

## 2018-12-07 ENCOUNTER — Ambulatory Visit (HOSPITAL_COMMUNITY): Payer: 59 | Admitting: Psychology

## 2018-12-07 DIAGNOSIS — Z20822 Contact with and (suspected) exposure to covid-19: Secondary | ICD-10-CM

## 2018-12-08 LAB — NOVEL CORONAVIRUS, NAA: SARS-CoV-2, NAA: NOT DETECTED

## 2018-12-08 NOTE — Telephone Encounter (Signed)
Patient went to [facility] for testing, we did not place the orders and there was no note advising for testing. Patient needs a note to be out of work due to testing  Please advise

## 2018-12-09 ENCOUNTER — Telehealth: Payer: Self-pay | Admitting: Family Medicine

## 2018-12-09 NOTE — Telephone Encounter (Signed)
Patient needed note for COVID testing

## 2018-12-09 NOTE — Telephone Encounter (Signed)
Sent letter to patient.

## 2018-12-14 ENCOUNTER — Ambulatory Visit (HOSPITAL_COMMUNITY): Payer: 59 | Admitting: Psychology

## 2018-12-21 ENCOUNTER — Other Ambulatory Visit: Payer: Self-pay

## 2018-12-21 ENCOUNTER — Ambulatory Visit (INDEPENDENT_AMBULATORY_CARE_PROVIDER_SITE_OTHER): Payer: 59 | Admitting: Psychology

## 2018-12-21 DIAGNOSIS — F3161 Bipolar disorder, current episode mixed, mild: Secondary | ICD-10-CM

## 2018-12-21 NOTE — Progress Notes (Signed)
Virtual Visit via Video Note  I connected with Kathryn [name] on 12/21/18 at  9:00 AM EST by a video enabled telemedicine application and verified that I am speaking with the correct person using two identifiers.   I discussed the limitations of evaluation and management by telemedicine and the availability of in person appointments. The patient expressed understanding and agreed to proceed.  History of Present Illness:    Observations/Objective:   Assessment and Plan:   Follow Up Instructions:    I discussed the assessment and treatment plan with the patient. The patient was provided an opportunity to ask questions and all were answered. The patient agreed with the plan and demonstrated an understanding of the instructions.   The patient was advised to call back or seek an in-person evaluation if the symptoms worsen or if the condition fails to improve as anticipated.  I provided 38 minutes of non-face-to-face time during this encounter.   [name] [facility]    THERAPIST PROGRESS NOTE  Session Time: 9.02am-9.40am  Participation Level: Active  Behavioral Response: Well GroomedAlertaffect wnl  Type of Therapy: Individual Therapy  Treatment Goals addressed: Diagnosis: Bipolar 1 d/o and goal 1.  Interventions: CBT and Strength-based  Summary: Kathryn [name] is a 23 y.o. female who presents with affect wnl.  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  Pt reported that she is struggling a little w/ work- not enjoying as much because annoyed w/ picking up slack of others.  Pt acknowledge that not looking to leave at this point w/out a plan for self. 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  Pt reported that she is napping less and doing so by being more active w/  things in afternoon which is helping.  Pt reports that she feels she is doing well w/ setting boundaries and able to say no- which is good progress for her.  Pt acknowledged that still has a lot of anger towards dad for past and sees how this emerges at times and ways she can vent about.   Suicidal/Homicidal: Nowithout intent/plan  Therapist Response: Assessed pt current functioning per pt report.  Processed w/pt coping w/ work stress and focus on what is in her control and exploring opportunities.  Discussed w/ pt coping w/ anxiety and anger and decisions she is making to express and cope through.  Plan: Return again in 2 weeks, via webex.  F/u as scheduled w/ Dr. Montel Culver  Diagnosis:  Bipolar 1 d/o   [name] [facility] 12/21/2018

## 2018-12-26 ENCOUNTER — Ambulatory Visit (INDEPENDENT_AMBULATORY_CARE_PROVIDER_SITE_OTHER): Payer: Managed Care, Other (non HMO) | Admitting: Obstetrics & Gynecology

## 2018-12-26 ENCOUNTER — Encounter: Payer: Self-pay | Admitting: Obstetrics & Gynecology

## 2018-12-26 ENCOUNTER — Other Ambulatory Visit: Payer: Self-pay

## 2018-12-26 VITALS — BP 133/83 | HR 72 | Ht 68.5 in | Wt 153.0 lb

## 2018-12-26 DIAGNOSIS — N944 Primary dysmenorrhea: Secondary | ICD-10-CM

## 2018-12-26 DIAGNOSIS — Z01419 Encounter for gynecological examination (general) (routine) without abnormal findings: Secondary | ICD-10-CM

## 2018-12-26 DIAGNOSIS — Z124 Encounter for screening for malignant neoplasm of cervix: Secondary | ICD-10-CM

## 2018-12-26 DIAGNOSIS — N946 Dysmenorrhea, unspecified: Secondary | ICD-10-CM

## 2018-12-26 DIAGNOSIS — B009 Herpesviral infection, unspecified: Secondary | ICD-10-CM

## 2018-12-26 MED ORDER — LEVONORGEST-ETH ESTRAD 91-DAY 0.15-0.03 &0.01 MG PO TABS: 1.0000 | ORAL_TABLET | Freq: Every day | ORAL | 4 refills | Status: DC

## 2018-12-26 MED ORDER — DICLOFENAC POTASSIUM 50 MG PO TABS: 50.0000 mg | ORAL_TABLET | Freq: Three times a day (TID) | ORAL | 2 refills | Status: DC | PRN

## 2018-12-26 MED ORDER — VALACYCLOVIR HCL 1 G PO TABS: 1000.0000 mg | ORAL_TABLET | Freq: Every day | ORAL | 4 refills | Status: DC

## 2018-12-26 NOTE — Patient Instructions (Signed)
GO WHITE: Soap: UNSCENTED Dove (white box light green writing) Laundry detergent (underwear)- Dreft or [employer] unscented WHITE 100% cotton panties (NOT just cotton crouch) Sanitary napkin/panty liners: UNSCENTED.  If it doesn't SAY unscented it can have a scent/perfume    NO PERFUMES OR LOTIONS OR POTIONS in the vulvar area (may use regular [place]) Condoms: hypoallergenic only. Non dyed (no color) Toilet papers: white only Wash clothes: use a separate wash cloth. WHITE.  Washed in Dreft.  

## 2018-12-26 NOTE — Progress Notes (Signed)
Subjective:     Kathryn [name] is a 23 y.o. female here for a routine exam. G0 LMP very ligth to in bleeding on generic of Seasonalle. Pt reports that her cycles were heavy and very painful prior to starting OCPs. On the OCPs, her cycles are less painful but, she sometimes has no cycles during the withdrawal time. 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 She is sexually active; same sex partner. She also reprots a h/o trich.    Pt reports  A prev h/o recurrent BV that is improved at present.    Gynecologic History Patient's last menstrual period was 12/24/2018. Contraception: OCP (estrogen/progesterone) same sex partner Last Pap: n/a Last mammogram: n/a  Obstetric History OB History  Gravida Para Term Preterm AB Living  0 0 0 0 0 0  SAB TAB Ectopic Multiple Live Births  0 0 0 0 0    The following portions of the patient's history were reviewed and updated as appropriate: allergies, current medications, past family history, past medical history, past social history, past surgical history and problem list.  Review of Systems Pertinent items are noted in HPI.    Objective:  BP 133/83   Pulse 72   Ht 5' 8.5" (1.74 m)   Wt 153 lb (69.4 kg)   LMP 12/24/2018   BMI 22.93 kg/m   General Appearance:    Alert, cooperative, no distress, appears stated age  Head:    Normocephalic, without obvious abnormality, atraumatic  Eyes:    conjunctiva/corneas clear, EOM's intact, both eyes  Ears:    Normal external ear canals, both ears  Nose:   Nares normal, septum midline, mucosa normal, no drainage    or sinus tenderness  Throat:   Lips, mucosa, and tongue normal; teeth and gums normal  Neck:   Supple, symmetrical, trachea midline, no adenopathy;    thyroid:  no enlargement/tenderness/nodules  Back:     Symmetric, no curvature, ROM normal, no CVA tenderness  Lungs:     respirations unlabored  Chest Wall:    No tenderness or deformity   Heart:    Regular rate and rhythm   Breast Exam:    No tenderness, masses, or nipple abnormality  Abdomen:     Soft, non-tender, bowel sounds active all four quadrants,    no masses, no organomegaly  Genitalia:    Normal female without lesion, discharge or tenderness     Extremities:   Extremities normal, atraumatic, no cyanosis or edema  Pulses:   2+ and symmetric all extremities  Skin:   Skin color, texture, turgor normal, no rashes or lesions; multiple tattoos    Assessment:    Healthy female exam.   H/o PCOS- On OCPs H/o primary dysmenorrhea- managed with OCPs and NSAIDS. Pain not always controlled with Mortrin. Will add cataflam H/o recurrent BV- improved H/o HSV- currenlt on incorrect dosage for suppression    Plan:   Refilled 3 month OCPs (generic Seasonelle) Cataflam 50mg  po tid prn pain Hold Motrin Valtrex decrease from 1000mg  bid to 1000mg  daily for suppression (pt given handout with correct dosage listed) F/u PAP  F/u in 1 year os sooner prn  Thedford Bunton L. Harraway-Smith, M.D., [name]

## 2018-12-28 ENCOUNTER — Ambulatory Visit (HOSPITAL_COMMUNITY): Payer: 59 | Admitting: Psychology

## 2018-12-28 LAB — CYTOLOGY - PAP: Diagnosis: HIGH — AB

## 2018-12-29 ENCOUNTER — Telehealth: Payer: Self-pay

## 2018-12-29 NOTE — Telephone Encounter (Signed)
Patient called and made aware of abnormal pap smear. Patient made aware that she will need a colposcopy done. Explained procedure and answered patients questions about the colposcopy. Patient scheduled for Jan 30, 2019. Kathrene Alu RN

## 2019-01-04 ENCOUNTER — Other Ambulatory Visit: Payer: Self-pay

## 2019-01-04 ENCOUNTER — Ambulatory Visit (INDEPENDENT_AMBULATORY_CARE_PROVIDER_SITE_OTHER): Payer: 59 | Admitting: Psychology

## 2019-01-04 DIAGNOSIS — F3161 Bipolar disorder, current episode mixed, mild: Secondary | ICD-10-CM | POA: Diagnosis not present

## 2019-01-04 NOTE — Progress Notes (Signed)
Virtual Visit via Video Note  I connected with Kathryn [name] on 01/04/19 at  9:00 AM EST by a video enabled telemedicine application and verified that I am speaking with the correct person using two identifiers.   I discussed the limitations of evaluation and management by telemedicine and the availability of in person appointments. The patient expressed understanding and agreed to proceed.   I discussed the assessment and treatment plan with the patient. The patient was provided an opportunity to ask questions and all were answered. The patient agreed with the plan and demonstrated an understanding of the instructions.   The patient was advised to call back or seek an in-person evaluation if the symptoms worsen or if the condition fails to improve as anticipated.  I provided 36 minutes of non-face-to-face time during this encounter.   [name] [facility]    THERAPIST PROGRESS NOTE  Session Time: 9am-9.36am  Participation Level: Active  Behavioral Response: Well GroomedAlertrestless  Type of Therapy: Individual Therapy  Treatment Goals addressed: Diagnosis: Bipolar 1 d/o and goal 1.  Interventions: CBT and Strength-based  Summary: Kathryn [name] is a 23 y.o. female who presents with affect wnl.  pt is more restless in session.  Pt reported that she has felt more easily irritable recently and dealt w/ some anxiety.  Pt reported stressors of work, anniversary of death of friend and grandfather upcoming and interactions w/ friend she reunited w/ can be irritating.  Pt recognized need for more space in this relationship.  Pt is able to identify that can communicate w/ employeer, but can't change policies, expectations.  Pt agrees to also focus on self care, rest/relaxation, things she can enjoy.  Pt is looking forward to going home for thanksgiving for 3 days.   Suicidal/Homicidal: Nowithout intent/plan  Therapist Response: Assessed pt current functioning per pt report.  Processed w/pt  coping w/ stressors and report of increased irritability. Explored w/pt interactions at work and w/ friends.  Discussed pt coping skills- grounding and self care to assist through coping.  Plan: Return again in 2 weeks, via webex.  Pt f/u as scheduled w/ Dr. Montel Culver.  Diagnosis: Bipolar d/o    [name] [facility] 01/04/2019

## 2019-01-11 ENCOUNTER — Ambulatory Visit (HOSPITAL_COMMUNITY): Payer: 59 | Admitting: Psychology

## 2019-01-18 ENCOUNTER — Other Ambulatory Visit: Payer: Self-pay

## 2019-01-18 ENCOUNTER — Ambulatory Visit (INDEPENDENT_AMBULATORY_CARE_PROVIDER_SITE_OTHER): Payer: 59 | Admitting: Psychology

## 2019-01-18 DIAGNOSIS — F3161 Bipolar disorder, current episode mixed, mild: Secondary | ICD-10-CM | POA: Diagnosis not present

## 2019-01-18 NOTE — Progress Notes (Signed)
Virtual Visit via Video Note  I connected with Kathryn [name] on 01/18/19 at  2:30 PM EST by a video enabled telemedicine application and verified that I am speaking with the correct person using two identifiers.   I discussed the limitations of evaluation and management by telemedicine and the availability of in person appointments. The patient expressed understanding and agreed to proceed.    I discussed the assessment and treatment plan with the patient. The patient was provided an opportunity to ask questions and all were answered. The patient agreed with the plan and demonstrated an understanding of the instructions.   The patient was advised to call back or seek an in-person evaluation if the symptoms worsen or if the condition fails to improve as anticipated.  I provided 45 minutes of non-face-to-face time during this encounter.   [name] [facility]    THERAPIST PROGRESS NOTE  Session Time: 2.30pm-3.15pm  Participation Level: Active  Behavioral Response: Well GroomedAlertaffect wnl  Type of Therapy: Individual Therapy  Treatment Goals addressed: Diagnosis: Bipolar 1 d/o and goal 1.  Interventions: CBT and Strength-based  Summary: Kathryn [name] is a 23 y.o. female who presents with affect wnl.  pt reported that she spent a week back home at her mom's and w/ family. Pt reported that it's hard coming back and recognizes she wants to relocate-but needs the funds to get started w/ this.  Pt reported that she has recognized increased depressed moods.  Pt reported that aware she is "in her head' more and w/ distortions.  Pt is able to reframe.  Pt is struggling to have motivation to get things done.  Pt also feels that her ADHD meds wearing off sooner.  Pt reports that not happy at wrk and looking for opportunities- doesn't want to leave on an impulse.    Suicidal/Homicidal: Nowithout intent/plan  Therapist Response: Assessed pt current functioning per pt report.  Processed w/pt  increased depressed moods and ways of being present focus and not ruminating on negative thoughts.  Explored w/pt interactions that have been positive and importance of keeping connected.   Plan: Return again in 2 weeks, via webex.  Pt to f/u as scheduled w/ Dr. Montel Culver  Diagnosis: Bipolar 1 d/o  [name] [facility] 01/18/2019

## 2019-01-30 ENCOUNTER — Other Ambulatory Visit (HOSPITAL_COMMUNITY)
Admission: RE | Admit: 2019-01-30 | Discharge: 2019-01-30 | Disposition: A | Discharge status: Home / Self Care | Payer: Managed Care, Other (non HMO) | Source: Ambulatory Visit | Attending: Obstetrics & Gynecology | Admitting: Obstetrics & Gynecology

## 2019-01-30 ENCOUNTER — Encounter: Payer: Self-pay | Admitting: Obstetrics & Gynecology

## 2019-01-30 ENCOUNTER — Ambulatory Visit (INDEPENDENT_AMBULATORY_CARE_PROVIDER_SITE_OTHER): Payer: Managed Care, Other (non HMO) | Admitting: Obstetrics & Gynecology

## 2019-01-30 VITALS — BP 122/81 | HR 121 | Ht 68.5 in | Wt 148.0 lb

## 2019-01-30 DIAGNOSIS — D069 Carcinoma in situ of cervix, unspecified: Secondary | ICD-10-CM

## 2019-01-30 DIAGNOSIS — R87613 High grade squamous intraepithelial lesion on cytologic smear of cervix (HGSIL): Secondary | ICD-10-CM

## 2019-01-30 DIAGNOSIS — Z23 Encounter for immunization: Secondary | ICD-10-CM | POA: Diagnosis not present

## 2019-01-30 NOTE — Progress Notes (Signed)
Patient given informed consent, signed copy in the chart, time out was performed.  Placed in lithotomy position. Cervix viewed with speculum and colposcope after application of acetic acid.  12/26/2018 Clinical History: LMP: 12/24/18 Specimen Submitted: A. CERVICOVAGINAL, LIQUID BASED PAP SMEAR: SPECIMEN ADEQUACY: Satisfactory for evaluation; transformation zone component PRESENT. INTERPRETATION: - High grade squamous intraepithelial lesion (HSIL)  Colposcopy adequate? yes Acetowhite lesions? Yes.  2 cm lesion at the 11-1:30 position Punctation? no Mosaicism? no Abnormal vasculature? no Biopsies? yes ECC? Yes  Rec tobacco cessation Patient was given post procedure instructions.   She will return in 2-4 weeks for results. Begin HPv vaccine series today  Reginia Battie L. Harraway-Smith, M.D., [name]

## 2019-01-30 NOTE — Patient Instructions (Signed)
Colposcopy, Care After This sheet gives you information about how to care for yourself after your procedure. Your health care provider may also give you more specific instructions. If you have problems or questions, contact your health care provider. What can I expect after the procedure? If you had a colposcopy without a biopsy, you can expect to feel fine right away, but you may have some spotting for a few days. You can go back to your normal activities. If you had a colposcopy with a biopsy, it is common to have:  Soreness and pain. This may last for a few days.  Light-headedness.  Mild vaginal bleeding or dark-colored, grainy discharge. This may last for a few days. The discharge may be due to a solution that was used during the procedure. You may need to wear a sanitary pad during this time.  Spotting for at least 48 hours after the procedure. Follow these instructions at home:   Take over-the-counter and prescription medicines only as told by your health care provider. Talk with your health care provider about what type of over-the-counter pain medicine and prescription medicine you can start taking again. It is especially important to talk with your health care provider if you take blood-thinning medicine.  Do not drive or use heavy machinery while taking prescription pain medicine.  For at least 3 days after your procedure, or as long as told by your health care provider, avoid: ? Douching. ? Using tampons. ? Having sexual intercourse.  Continue to use birth control (contraception).  Limit your physical activity for the first day after the procedure as told by your health care provider. Ask your health care provider what activities are safe for you.  It is up to you to get the results of your procedure. Ask your health care provider, or the department performing the procedure, when your results will be ready.  Keep all follow-up visits as told by your health care provider.  This is important. Contact a health care provider if:  You develop a skin rash. Get help right away if:  You are bleeding heavily from your vagina or you are passing blood clots. This includes using more than one sanitary pad per hour for 2 hours in a row.  You have a fever or chills.  You have pelvic pain.  You have abnormal, yellow-colored, or bad-smelling vaginal discharge. This could be a sign of infection.  You have severe pain or cramps in your lower abdomen that do not get better with medicine.  You feel light-headed or dizzy, or you faint. Summary  If you had a colposcopy without a biopsy, you can expect to feel fine right away, but you may have some spotting for a few days. You can go back to your normal activities.  If you had a colposcopy with a biopsy, you may notice mild pain and spotting for 48 hours after the procedure.  Avoid douching, using tampons, and having sexual intercourse for 3 days after the procedure or as long as told by your health care provider.  Contact your health care provider if you have bleeding, severe pain, or signs of infection. This information is not intended to replace advice given to you by your health care provider. Make sure you discuss any questions you have with your health care provider. Document Released: 11/23/2012 Document Revised: 01/15/2017 Document Reviewed: 09/20/2015 Elsevier Patient Education  [place] Electrosurgical Excision Procedure Loop electrosurgical excision procedure (LEEP) is the cutting and removal (excision) of  tissue from the cervix. The cervix is the bottom part of the uterus that opens into the vagina. The tissue that is removed from the cervix is examined to see if there are precancerous cells or cancer cells present. LEEP may be done when:  You have abnormal bleeding from your cervix.  You have an abnormal Pap test result.  Your health care provider finds an abnormality on your cervix during a  pelvic exam. LEEP typically only takes a few minutes and is often done in the health care provider's office. The procedure is safe for women who are trying to get pregnant. However, the procedure is usually not done during a menstrual period or during pregnancy. Tell a health care provider about:  Any allergies you have.  All medicines you are taking, including vitamins, herbs, eye drops, creams, and over-the-counter medicines.  Any blood disorders you have.  Any medical conditions you have, including current or past vaginal infections such as herpes or sexually-transmitted infections (STIs).  Whether you are pregnant or may be pregnant.  Whether or not you are having vaginal bleeding on the day of the procedure. What are the risks? Generally, this is a safe procedure. However, problems may occur, including:  Infection.  Bleeding.  Allergic reactions to medicines.  Changes or scarring in the cervix.  Increased risk of early (preterm) labor in future pregnancies. What happens before the procedure?  Ask your health care provider about: ? Changing or stopping your regular medicines. This is especially important if you are taking diabetes medicines or blood thinners. ? Taking medicines such as aspirin and ibuprofen. These medicines can thin your blood. Do not take these medicines unless your health care provider tells you to take them. ? Taking over-the-counter medicines, vitamins, herbs, and supplements.  Your health care provider may recommend that you take pain medicine before the procedure.  Ask your health care provider if you should plan to have someone take you home after the procedure. What happens during the procedure?   An instrument called a speculum will be placed in your vagina. This will allow your health care provider to see your cervix.  You will be given a medicine to numb the area (local anesthetic). The medicine will be injected into your cervix and the  surrounding area.  A solution will be applied to your cervix. This solution will help the health care provider find the abnormal cells that need to be removed.  A thin wire loop will be passed through your vagina. The wire will be used to burn (cauterize) the cervical tissue with an electrical current.  You may feel faint during the procedure. Tell your health care provider right away if you feel this way.  The abnormal cervical tissue will be removed.  Any open blood vessels will be cauterized to prevent bleeding.  A paste may be applied to the cauterized area of your cervix to help prevent bleeding.  The sample of cervical tissue will be examined under a microscope. The procedure may vary among health care providers and hospitals. What can I expect after the procedure? After the procedure, it is common to have:  Mild abdominal cramps that are similar to menstrual cramps. These may last for up to 1 week.  A small amount of pink-tinged or bloody vaginal discharge, including light to moderate bleeding, for 1-2 weeks.  A dark-colored discharge coming from your vagina. This is from the paste that was used on the cervix to prevent bleeding. It is up to  you to get the results of your procedure. Ask your health care provider, or the department that is doing the procedure, when your results will be ready. Follow these instructions at home:  Take over-the-counter and prescription medicines only as told by your health care provider.  Return to your normal activities as told by your health care provider. Ask your health care provider what activities are safe for you.  Do not put anything in your vagina for 2 weeks after the procedure or until your health care provider says that it is okay. This includes tampons, creams, and douches.  Do not have sex until your health care provider approves.  Keep all follow-up visits as told by your health care provider. This is important. Contact a health  care provider if you:  Have a fever or chills.  Feel unusually weak.  Have vaginal bleeding that is heavier or longer than a normal menstrual cycle. A sign of this can be soaking a pad with blood or bleeding with clots.  Develop a bad smelling vaginal discharge.  Have severe abdominal pain or cramping. Summary  Loop electrosurgical excision procedure (LEEP) is the removal of tissue from the cervix. The removed tissue will be checked for precancerous cells or cancer cells.  LEEP typically only takes a few minutes and is often done in the health care provider's office.  Do not put anything in your vagina for 2 weeks after the procedure or until your health care provider says that it is okay. This includes tampons, creams, and douches.  Keep all follow-up visits as told by your health care provider. Ask your health care provider, or the department that is doing the procedure, when your results will be ready. This information is not intended to replace advice given to you by your health care provider. Make sure you discuss any questions you have with your health care provider. Document Released: 04/25/2002 Document Revised: 02/25/2018 Document Reviewed: 02/25/2018 Elsevier Patient Education  2020 [employer].

## 2019-01-31 LAB — SURGICAL PATHOLOGY

## 2019-02-01 ENCOUNTER — Ambulatory Visit (INDEPENDENT_AMBULATORY_CARE_PROVIDER_SITE_OTHER): Payer: 59 | Admitting: Psychology

## 2019-02-01 ENCOUNTER — Other Ambulatory Visit: Payer: Self-pay

## 2019-02-01 DIAGNOSIS — F3161 Bipolar disorder, current episode mixed, mild: Secondary | ICD-10-CM | POA: Diagnosis not present

## 2019-02-01 NOTE — Progress Notes (Signed)
Virtual Visit via Video Note  I connected with Kathryn [name] on 02/01/19 at  9:00 AM EST by a video enabled telemedicine application and verified that I am speaking with the correct person using two identifiers.   I discussed the limitations of evaluation and management by telemedicine and the availability of in person appointments. The patient expressed understanding and agreed to proceed.    I discussed the assessment and treatment plan with the patient. The patient was provided an opportunity to ask questions and all were answered. The patient agreed with the plan and demonstrated an understanding of the instructions.   The patient was advised to call back or seek an in-person evaluation if the symptoms worsen or if the condition fails to improve as anticipated.  I provided 40 minutes of non-face-to-face time during this encounter.   Kathryn [name] [facility]    THERAPIST PROGRESS NOTE  Session Time: 9am-9.45am  Participation Level: Active  Behavioral Response: Well GroomedAlertaffect wnl  Type of Therapy: Individual Therapy  Treatment Goals addressed: Diagnosis: bipolar 1 d/o and goal 1.  Interventions: CBT  Summary: Kathryn [name] is a 23 y.o. female who presents with affect wnl.  pt reported that after our last session found out her friend was reconciling w/ an old ex that was not good for her.  Pt reported that clarified why friend had been standoffish.  Pt was able to work through to focus on not responding negatively to friend, acknowledging not her decision and what was most important was maintain contact w/her daughter.  Pt reports this is the focus for her now in interactions.  Pt reported that she has been working 2nd shift at work now- which doesn't prefer and still not getting hours she would like.  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  Pt is looking forward to Christmas and being home w/ family.   Suicidal/Homicidal: Nowithout intent/plan  Therapist Response: Assessed pt current functioning per pt report.  Processed w/pt stressors and how she is working through and coping.  Reflected that pt is reacting impulsively to and viewing for different perspective.  Explored w/pt holidays plans and those can engage with.  Plan: Return again in 2 weeks, via webex.  Pt to f/u as scheduled w/ Dr. Montel Culver.  Diagnosis: Bipolar 1 d/o  Kathryn [name] [facility] 02/01/2019

## 2019-02-08 ENCOUNTER — Telehealth: Payer: Self-pay

## 2019-02-08 NOTE — Telephone Encounter (Signed)
-----   Message from Lavonia Drafts, MD sent at 02/08/2019  4:31 PM EST ----- Please call pt. 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   Thx,  Clh-S

## 2019-02-08 NOTE — Telephone Encounter (Signed)
Patient called and verified by name and dob. Patient made aware that LEEP will need to be performed at her next appointment in January. Patient states understanding. Kathrene Alu RN

## 2019-02-15 ENCOUNTER — Other Ambulatory Visit: Payer: Self-pay

## 2019-02-15 ENCOUNTER — Ambulatory Visit (INDEPENDENT_AMBULATORY_CARE_PROVIDER_SITE_OTHER): Payer: 59 | Admitting: Psychology

## 2019-02-15 DIAGNOSIS — F3161 Bipolar disorder, current episode mixed, mild: Secondary | ICD-10-CM | POA: Diagnosis not present

## 2019-02-15 NOTE — Progress Notes (Signed)
Virtual Visit via Video Note  I connected with Kathryn [name] on 02/15/19 at  9:00 AM EST by a video enabled telemedicine application and verified that I am speaking with the correct person using two identifiers.   I discussed the limitations of evaluation and management by telemedicine and the availability of in person appointments. The patient expressed understanding and agreed to proceed.    I discussed the assessment and treatment plan with the patient. The patient was provided an opportunity to ask questions and all were answered. The patient agreed with the plan and demonstrated an understanding of the instructions.   The patient was advised to call back or seek an in-person evaluation if the symptoms worsen or if the condition fails to improve as anticipated.  I provided 32 minutes of non-face-to-face time during this encounter.   [name] [facility]    THERAPIST PROGRESS NOTE  Session Time: 9am-9.32am  Participation Level: Active  Behavioral Response: Well GroomedAlertaffect wnl  Type of Therapy: Individual Therapy  Treatment Goals addressed: Diagnosis: Bipolar 1 d/o and goal 1.  Interventions: CBT and Strength-based  Summary: Kathryn [name] is a 23 y.o. female who presents with affect wnl.  pt reported that she really enjoyed the time she had going to visit home for holidays. Pt reported that she had positive interactions w/ family and discussed potential work opportunities for when moves home.  Pt reported that dad didn't contact over holiday and that this does bother her but is reframing to reflect back on dad and his norm- lack of being a father figure. Pt decided she is not taking responsibility for reaching out. Pt reported that she has noticed that she tends to get more easily irritate, annoyed then was- mood more easily shifts.  Pt discussed ways she is planning for transition to move back home.  Suicidal/Homicidal: Nowithout intent/plan  Therapist Response: Assessed  pt current functioning per pt report. Explored holidays and family interactions. Processed w/pt coping w/ lack contact w/ dad and how pt can chose her response.  Discussed pt plan for moving back home and next steps taking.  Plan: Return again in 2 weeks, via webex.  F/u as scheduled w/ dR. Pucilowski.  Diagnosis: Bipolar 1 d/o    [name] [facility] 02/15/2019

## 2019-02-21 ENCOUNTER — Other Ambulatory Visit: Payer: Self-pay | Admitting: Family Medicine

## 2019-02-21 DIAGNOSIS — J029 Acute pharyngitis, unspecified: Secondary | ICD-10-CM

## 2019-02-22 ENCOUNTER — Telehealth (HOSPITAL_COMMUNITY): Payer: Self-pay | Admitting: Psychiatry

## 2019-02-27 ENCOUNTER — Encounter: Payer: Managed Care, Other (non HMO) | Admitting: Obstetrics & Gynecology

## 2019-02-28 ENCOUNTER — Other Ambulatory Visit: Payer: Self-pay

## 2019-02-28 ENCOUNTER — Ambulatory Visit (INDEPENDENT_AMBULATORY_CARE_PROVIDER_SITE_OTHER): Payer: 59 | Admitting: Psychiatry

## 2019-02-28 DIAGNOSIS — F3161 Bipolar disorder, current episode mixed, mild: Secondary | ICD-10-CM | POA: Diagnosis not present

## 2019-02-28 DIAGNOSIS — F900 Attention-deficit hyperactivity disorder, predominantly inattentive type: Secondary | ICD-10-CM

## 2019-02-28 DIAGNOSIS — F988 Other specified behavioral and emotional disorders with onset usually occurring in childhood and adolescence: Secondary | ICD-10-CM | POA: Diagnosis not present

## 2019-02-28 DIAGNOSIS — F411 Generalized anxiety disorder: Secondary | ICD-10-CM | POA: Diagnosis not present

## 2019-02-28 MED ORDER — AMPHETAMINE-DEXTROAMPHETAMINE 15 MG PO TABS: 15.0000 mg | ORAL_TABLET | Freq: Three times a day (TID) | ORAL | 0 refills | Status: DC

## 2019-02-28 MED ORDER — BUSPIRONE HCL 15 MG PO TABS: 15.0000 mg | ORAL_TABLET | Freq: Two times a day (BID) | ORAL | 1 refills | Status: AC

## 2019-02-28 MED ORDER — VENLAFAXINE HCL ER 75 MG PO CP24: 75.0000 mg | ORAL_CAPSULE | Freq: Every day | ORAL | 1 refills | Status: DC

## 2019-02-28 MED ORDER — LAMOTRIGINE 150 MG PO TABS: 150.0000 mg | ORAL_TABLET | Freq: Every day | ORAL | 1 refills | Status: DC

## 2019-02-28 MED ORDER — ZOLPIDEM TARTRATE 10 MG PO TABS: 10.0000 mg | ORAL_TABLET | Freq: Every evening | ORAL | 2 refills | Status: DC | PRN

## 2019-02-28 NOTE — Progress Notes (Signed)
BH MD/PA/NP OP Progress Note  02/28/2019 10:14 AM Keyshia Kearn  MRN:  [id number] Interview was conducted by phone and I verified that I was speaking with the correct person using two identifiers. I discussed the limitations of evaluation and management by telemedicine and  the availability of in person appointments. Patient expressed understanding and agreed to proceed.  Chief Complaint: Drop off in focusing ability by the end of work day.  HPI: 24 yo single white female with bipolar 1 disorder, ADHD and GAD (also hx of cannabis use disorder). She has been on venlafaxine/buspirone for a long time for anxiety/depression. Dr. De Nurse added lamotrigine for mood stabilization and she has been recently taking 150 mg ay bedtime. She also takes Adderall 15 mg bid for ADD. In late May Abiageal started feeling more depressed and admitted to having passive SI. She entered IOP on 6/620 and completed it on 08/06/18. Her mood improved and she denies having any further SI. Sleep is good when she takes 10mg  of zolpidem (not every night). She has no hx of psychotic sx. In the past she tried XR for of Adderall but did not judge it to be effective hence switch to IRwhich works much better. Jaylie reports on occasion feeling very sleepy in mid afternoon (3-4 PM) but as it does not appear daily it is not likely related to "Adderall crash".She does however notice clear drop in her ability to focus later in the day. On days when she has to work (at [employer]) later this poses a problem. Aiza has a hx of cannabis abuse and at this time she smokes less frequently.She continues counseling with [name].  Visit Diagnosis:    ICD-10-CM   1. GAD (generalized anxiety disorder)  F41.1   2. Attention deficit disorder, unspecified hyperactivity presence  F98.8 amphetamine-dextroamphetamine (ADDERALL) 15 MG tablet    amphetamine-dextroamphetamine (ADDERALL) 15 MG tablet    amphetamine-dextroamphetamine (ADDERALL) 15 MG tablet  3.  Bipolar 1 disorder, mixed, mild ([place])  F31.61   4. Attention deficit hyperactivity disorder (ADHD), predominantly inattentive type  F90.0     Past Psychiatric History: Please see intake H&P.  Past Medical History:  Past Medical History:  Diagnosis Date  . Abnormal liver function tests 02/02/2016  . ADHD 06/10/2016  . Alcohol use 02/23/2016  . Allergy   . Anxiety   . Arm fracture, right    as child, fx in three places, no surgery  . Depression   . Depression with anxiety 2014-05-03   PTSD s/p death of best friend   . Essential hypertension 02/02/2016  . GERD (gastroesophageal reflux disease)   . 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 Headache 04/09/2016  . Hyperlipidemia, mixed 02/02/2016  . Migraine   . Ovarian cyst   . PCOS (polycystic ovarian syndrome)   . Preventative health care 10/27/2015  . Thrombocytosis ([place]) 04/09/2016  . Vaginal Pap smear, abnormal    HGSIL    Past Surgical History:  Procedure Laterality Date  . ADENOIDECTOMY  2002  . TYMPANOSTOMY TUBE PLACEMENT Bilateral 1999    Family Psychiatric History: Reviewed.  Family History:  Family History  Problem Relation Age of Onset  . Hypertension Mother   . Heart disease Mother        CAD, stent at 71, s/p cardiac arrest with Defib  . Hyperlipidemia Mother   . Diabetes Mother        s/p gastric bypass  . Obesity Mother        s/p gastric bypass  .  OCD Mother   . Hyperlipidemia Father   . Hypertension Father   . Anxiety disorder Father   . Asthma Sister   . Hyperlipidemia Brother   . Anxiety disorder Brother   . Stroke Maternal Grandmother   . Thyroid disease Maternal Grandmother   . Anxiety disorder Maternal Grandmother   . Heart disease Maternal Grandfather        MI first at 41, s/p stents and CABG  . Hyperlipidemia Maternal Grandfather   . Hypertension Maternal Grandfather   . Stroke Maternal Grandfather   . Cancer Maternal Grandfather        melanoma  . Aneurysm Maternal Grandfather        brain  .  Kidney disease Maternal Grandfather        tumor  . Heart disease Paternal Grandmother   . Cancer Paternal Lucilla Edin and brain  . Heart disease Maternal Uncle   . Vision loss Maternal Uncle   . Anxiety disorder Maternal Uncle   . Anxiety disorder Maternal Aunt   . Depression Maternal Aunt     Social History:  Social History   Socioeconomic History  . Marital status: Single    Spouse name: Not on file  . Number of children: 0  . Years of education: 69  . Highest education level: Not on file  Occupational History    Comment: [employer]  Tobacco Use  . Smoking status: Heavy Tobacco Smoker    Packs/day: 1.50    Types: Cigarettes  . Smokeless tobacco: Current User    Types: Snuff  Substance and Sexual Activity  . Alcohol use: No    Alcohol/week: 0.0 standard drinks    Comment: maybe every 6 months  . Drug use: Yes    Types: Marijuana    Comment: daily  . Sexual activity: Yes    Birth control/protection: Pill  Other Topics Concern  . Not on file  Social History Narrative  . Not on file   Social Determinants of Health   Financial Resource Strain:   . Difficulty of Paying Living Expenses: Not on file  Food Insecurity:   . Worried About Charity fundraiser in the Last Year: Not on file  . Ran Out of Food in the Last Year: Not on file  Transportation Needs:   . Lack of Transportation (Medical): Not on file  . Lack of Transportation (Non-Medical): Not on file  Physical Activity:   . Days of Exercise per Week: Not on file  . Minutes of Exercise per Session: Not on file  Stress:   . Feeling of Stress : Not on file  Social Connections:   . Frequency of Communication with Friends and Family: Not on file  . Frequency of Social Gatherings with Friends and Family: Not on file  . Attends Religious Services: Not on file  . Active Member of Clubs or Organizations: Not on file  . Attends Archivist Meetings: Not on file  . Marital Status: Not on  file    Allergies:  Allergies  Allergen Reactions  . Oxycodone Nausea Only    Oxycontin also  . Seroquel [Quetiapine Fumarate]     Mental status changes    Metabolic Disorder Labs: Lab Results  Component Value Date   HGBA1C 5.2 11/17/2018   No results found for: PROLACTIN Lab Results  Component Value Date   CHOL 223 (H) 11/17/2018   TRIG 55 11/17/2018   HDL 48 11/17/2018  CHOLHDL 4.6 (H) 11/17/2018   VLDL 21.4 05/05/2017   LDLCALC 166 (H) 11/17/2018   LDLCALC 155 (H) 05/05/2017   Lab Results  Component Value Date   TSH 0.735 11/17/2018   TSH 2.49 05/05/2017    Therapeutic Level Labs: No results found for: LITHIUM No results found for: VALPROATE No components found for:  CBMZ  Current Medications: Current Outpatient Medications  Medication Sig Dispense Refill  . albuterol (PROVENTIL HFA;VENTOLIN HFA) 108 (90 Base) MCG/ACT inhaler Inhale 2 puffs into the lungs every 6 (six) hours as needed for wheezing or shortness of breath. 3 Inhaler 0  . [START ON 05/01/2019] amphetamine-dextroamphetamine (ADDERALL) 15 MG tablet Take 1 tablet by mouth 3 (three) times daily. August 2020 90 tablet 0  . [START ON 03/31/2019] amphetamine-dextroamphetamine (ADDERALL) 15 MG tablet Take 1 tablet by mouth 3 (three) times daily. July 2020 90 tablet 0  . amphetamine-dextroamphetamine (ADDERALL) 15 MG tablet Take 1 tablet by mouth 3 (three) times daily. June 2020 90 tablet 0  . busPIRone (BUSPAR) 15 MG tablet Take 1 tablet (15 mg total) by mouth 2 (two) times daily. 180 tablet 1  . diclofenac (CATAFLAM) 50 MG tablet Take 1 tablet (50 mg total) by mouth 3 (three) times daily as needed. 30 tablet 2  . famotidine (PEPCID) 20 MG tablet Take 1 tablet (20 mg total) by mouth 2 (two) times daily. (Patient not taking: Reported on 12/26/2018) 180 tablet 0  . gabapentin (NEURONTIN) 300 MG capsule Take 1 capsule (300 mg total) by mouth 3 (three) times daily. 270 capsule 3  . lamoTRIgine (LAMICTAL) 150 MG  tablet Take 1 tablet (150 mg total) by mouth at bedtime. Take one a day 90 tablet 1  . Levonorgestrel-Ethinyl Estradiol (AMETHIA) 0.15-0.03 &0.01 MG tablet Take 1 tablet by mouth daily. 91 tablet 4  . mometasone-formoterol (DULERA) 100-5 MCG/ACT AERO Inhale 2 puffs into the lungs 2 (two) times daily. 3 Inhaler 1  . montelukast (SINGULAIR) 10 MG tablet TAKE ONE TABLET BY MOUTH EVERY NIGHT AT BEDTIME 90 tablet 0  . omeprazole (PRILOSEC) 40 MG capsule Take 1 capsule (40 mg total) by mouth 2 (two) times daily. 180 capsule 3  . ondansetron (ZOFRAN) 4 MG tablet Take 1 tablet (4 mg total) by mouth every 8 (eight) hours as needed for nausea or vomiting. (Patient not taking: Reported on 12/26/2018) 20 tablet 0  . valACYclovir (VALTREX) 1000 MG tablet Take 1 tablet (1,000 mg total) by mouth daily. 90 tablet 4  . venlafaxine XR (EFFEXOR-XR) 75 MG 24 hr capsule Take 1 capsule (75 mg total) by mouth daily with breakfast. 90 capsule 1  . zolpidem (AMBIEN) 10 MG tablet Take 1 tablet (10 mg total) by mouth at bedtime as needed for sleep. 30 tablet 2   No current facility-administered medications for this visit.    Psychiatric Specialty Exam: Review of Systems  Psychiatric/Behavioral: Positive for decreased concentration and sleep disturbance.  All other systems reviewed and are negative.   There were no vitals taken for this visit.There is no height or weight on file to calculate BMI.  General Appearance: NA  Eye Contact:  NA  Speech:  Clear and Coherent and Normal Rate  Volume:  Normal  Mood:  Some anxiety  Affect:  NA  Thought Process:  Goal Directed and Linear  Orientation:  Full (Time, Place, and Person)  Thought Content: Logical   Suicidal Thoughts:  No  Homicidal Thoughts:  No  Memory:  Immediate;   Good Recent;  Good Remote;   Good  Judgement:  Good  Insight:  Good  Psychomotor Activity:  NA  Concentration:  Concentration: Fair  Recall:  Good  Fund of Knowledge: Good  Language: Good   Akathisia:  Negative  Handed:  Right  AIMS (if indicated): not done  Assets:  Communication Skills Desire for Improvement Financial Resources/Insurance Housing Talents/Skills  ADL's:  Intact  Cognition: WNL  Sleep:  Fair   Screenings: PHQ2-9     Office Visit from 08/22/2015 in [employer] [facility]  PHQ-2 Total Score  5  PHQ-9 Total Score  19       Assessment and Plan: 24 yo single white female with bipolar 1 disorder, ADHD and GAD (also hx of cannabis use disorder). She has been on venlafaxine/buspirone for a long time for anxiety/depression. Dr. De Nurse added lamotrigine for mood stabilization and she has been recently taking 150 mg ay bedtime. She also takes Adderall 15 mg bid for ADD. In late May Eura started feeling more depressed and admitted to having passive SI. She entered IOP on 6/620 and completed it on 08/06/18. Her mood improved and she denies having any further SI. Sleep is good when she takes 10mg  of zolpidem (not every night). She has no hx of psychotic sx. In the past she tried XR for of Adderall but did not judge it to be effective hence switch to IRwhich works much better. Sallyann reports on occasion feeling very sleepy in mid afternoon (3-4 PM) but as it does not appear daily it is not likely related to "Adderall crash".She does however notice clear drop in her ability to focus later in the day. On days when she has to work (at [employer]) later this poses a problem. Taela has a hx of cannabis abuse and at this time she smokes less frequently.She continues counseling with [name].  Dx: ADD; Bipolar I in remission; GAD  Plan: We willcontinueLamictal to 150 mg at HS,buspirone 15 mg bid,Effexor xr 75 mg daily, Ambiento 10mg  prn sleep (does not use it nightly) and Adderall IR 15 mg to increase to tid (she will use third dose as needed). The plan was discussed with patient who had an opportunity to ask questions and these were all  answered. I spend80minutes in phone consultation with the patient.    Stephanie Acre, MD 02/28/2019, 10:14 AM

## 2019-03-01 ENCOUNTER — Other Ambulatory Visit: Payer: Self-pay

## 2019-03-01 ENCOUNTER — Ambulatory Visit (INDEPENDENT_AMBULATORY_CARE_PROVIDER_SITE_OTHER): Payer: 59 | Admitting: Psychology

## 2019-03-01 DIAGNOSIS — F3161 Bipolar disorder, current episode mixed, mild: Secondary | ICD-10-CM

## 2019-03-01 DIAGNOSIS — F411 Generalized anxiety disorder: Secondary | ICD-10-CM

## 2019-03-01 NOTE — Progress Notes (Signed)
Virtual Visit via Video Note  I connected with Kathryn [name] on 03/01/19 at  9:00 AM EST by a video enabled telemedicine application and verified that I am speaking with the correct person using two identifiers.   I discussed the limitations of evaluation and management by telemedicine and the availability of in person appointments. The patient expressed understanding and agreed to proceed.    I discussed the assessment and treatment plan with the patient. The patient was provided an opportunity to ask questions and all were answered. The patient agreed with the plan and demonstrated an understanding of the instructions.   The patient was advised to call back or seek an in-person evaluation if the symptoms worsen or if the condition fails to improve as anticipated.  I provided 54 minutes of non-face-to-face time during this encounter.   [name] [facility]    THERAPIST PROGRESS NOTE  Session Time: 9am-9.54am  Participation Level: Active  Behavioral Response: Well GroomedAlertaffect wnl  Type of Therapy: Individual Therapy  Treatment Goals addressed: Diagnosis: Bipolar 1 d/o, anxiety and goal 1.  Interventions: CBT and Supportive  Summary: Kathryn [name] is a 24 y.o. female who presents with affect wnl.  pt reported that she has been doing ok w/ mood.  Pt reported that she likes how Dr. Mamie Nick adjusted dose of meds for focus when needed.  Pt reported that she has made the decision to refrain from smoking marijuana for at least a month to be able to apply for jobs. Pt also aware that will help her w/ money and that she had been feeling that only things that making happy.  Pt discussed that she is reevaluating for self her direction.  Pt reported that has been focus on surviving this past year and now needing direction of her next steps- what to do for living, where she wants to be.  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  Pt acknowledged that "finding self" as process and continue to explored and can change paths.  Pt reported that she recently learned that dad had her blocked on facebook and dislike his recent handling of things w/ brother.  Pt reported that in her anger she really wanted to call/contact dad and vent and take revenge on things.  Pt was able to refrain and vent to a support and get better perspective.  Pt recognized that she has to let go of what is between mom and dad and let go that not going to have relationship she has wished for/the ideal.  Pt was able to identify that her family acutely is happier w/ parents divorced and that she has others who have been able to be positive female relationship w.    Suicidal/Homicidal: Nowithout intent/plan  Therapist Response: Asessed pt current functioning per pt report.  Processed w/ search for direction and how to view as process and explore, try things and shift if needed.  Discussed recent feelings re; dad's actions and how she was able to cope through and refrain from "reactive" approach.  Assisted pt w/ letting go process, grief of loss and ways she does have and experience what she wanted from dad but in other ways in her life- doesn't have to be fulfilled by dad.  Plan: Return again in 2 weeks, via webex.  F/u as scheduled w/ Dr. Montel Culver.  Diagnosis: Bipolar 1 d/o    [name] [facility] 03/01/2019

## 2019-03-08 ENCOUNTER — Encounter: Payer: Self-pay | Admitting: Obstetrics & Gynecology

## 2019-03-13 ENCOUNTER — Ambulatory Visit: Payer: Managed Care, Other (non HMO)

## 2019-03-15 ENCOUNTER — Ambulatory Visit (INDEPENDENT_AMBULATORY_CARE_PROVIDER_SITE_OTHER): Payer: 59 | Admitting: Psychology

## 2019-03-15 ENCOUNTER — Other Ambulatory Visit: Payer: Self-pay

## 2019-03-15 DIAGNOSIS — F3161 Bipolar disorder, current episode mixed, mild: Secondary | ICD-10-CM

## 2019-03-15 DIAGNOSIS — F411 Generalized anxiety disorder: Secondary | ICD-10-CM

## 2019-03-15 NOTE — Progress Notes (Signed)
Virtual Visit via Video Note  I connected with Kathryn [name] on 03/15/19 at  9:00 AM EST by a video enabled telemedicine application and verified that I am speaking with the correct person using two identifiers.   I discussed the limitations of evaluation and management by telemedicine and the availability of in person appointments. The patient expressed understanding and agreed to proceed.     I discussed the assessment and treatment plan with the patient. The patient was provided an opportunity to ask questions and all were answered. The patient agreed with the plan and demonstrated an understanding of the instructions.   The patient was advised to call back or seek an in-person evaluation if the symptoms worsen or if the condition fails to improve as anticipated.  I provided 58 minutes of non-face-to-face time during this encounter.   Kathryn [name] [facility]    THERAPIST PROGRESS NOTE  Session Time: 9.02am-10am  Participation Level: Active  Behavioral Response: Well GroomedAlertDepressed  Type of Therapy: Individual Therapy  Treatment Goals addressed: Diagnosis: Bipolar 1 d/o and goal 1.  Interventions: CBT and Supportive  Summary: Kathryn [name] is a 24 y.o. female who presents with affect down.  Pt reported that she has recognized that she isn't being honest and accountable in her tx.  Pt reported that she has been feeling down, depressed, not following through w/ things and feeling like she is just present.  Pt reported she doesn't feel the spark of joy- energy and the not feeling like herself.  Pt reported that she has questioned her medication, her dx, whether on the right thing- but also fearful of change and if will send her "spinning out of control".  Pt reported most days work and then comes home and spends her down time in her room and falling asleep.  Pt feels that in past she has had someone in her life that has been able to help hold her accountable and lacking this.  Pt  agrees to reach out to office for sooner appointment w/ dr. Mamie Nick.  And pt agrees to engage in something for self at least couple times a week- walk dog, drive, hike, connecting w/ friend.     Suicidal/Homicidal: Nowithout intent/plan  Therapist Response: Assessed pt current functioning per pt report.  Explored w/pt mood and minimizing in tx recently.  Processed w/pt her mood, lack of followup and things/others that support to holding accountable.  Discussed things pt can do in next week for self.   Plan: Return again in 2 weeks via webex.  Pt to f/u w/ scheduling for Dr. Montel Culver for sooner appointment.  Diagnosis: Bipolar d/o  Kathryn [name] [facility] 03/15/2019

## 2019-03-29 ENCOUNTER — Other Ambulatory Visit: Payer: Self-pay

## 2019-03-29 ENCOUNTER — Ambulatory Visit (INDEPENDENT_AMBULATORY_CARE_PROVIDER_SITE_OTHER): Payer: 59 | Admitting: Psychology

## 2019-03-29 DIAGNOSIS — F3161 Bipolar disorder, current episode mixed, mild: Secondary | ICD-10-CM

## 2019-03-29 NOTE — Progress Notes (Signed)
Virtual Visit via Video Note  I connected with [name] on 03/29/19 at  9:00 AM EST by a video enabled telemedicine application and verified that I am speaking with the correct person using two identifiers.   I discussed the limitations of evaluation and management by telemedicine and the availability of in person appointments. The patient expressed understanding and agreed to proceed.    I discussed the assessment and treatment plan with the patient. The patient was provided an opportunity to ask questions and all were answered. The patient agreed with the plan and demonstrated an understanding of the instructions.   The patient was advised to call back or seek an in-person evaluation if the symptoms worsen or if the condition fails to improve as anticipated.  I provided 47 minutes of non-face-to-face time during this encounter.   [name] [facility]    THERAPIST PROGRESS NOTE  Session Time: 9am-9.47am  Participation Level: Active  Behavioral Response: Well GroomedAlertaffect wnl  Type of Therapy: Individual Therapy  Treatment Goals addressed: Diagnosis: Bipolar 1 d/o and goal 1.  Interventions: CBT and Strength-based  Summary: Alura Santone is a 24 y.o. female who presents with affect wnl.  pt reported that mood had been pretty good till past 2 days-  She had a bad migraine on Monday that kept out of work.  Pt reported that felt down past 2 days and feeling stuck that can't move forward w/ things.  Pt reported that some struggle is her work schedule constantly shifting to develop of routine.  Pt reported also that she has decided to no longer pursue relationship w/ dad.  pt discussed the efforts she has put in w/out things be reciprocated when there are struggles or disagreement.  Pt feels that this is best for her to move forward but acknowledged that more difficult to grieve loss than thought.  Pt shared how also recently found out through sister about new girlfriend that has  school age children and that he is doing things for them- didn't for them growing up.  Pt discussed that how glad sister can have relationship w/ dad and happy for her- at times sees that dad chooses this as sister is successful and she is not. Pt is to identify other factors- (sister and her a different people w/ different personalities) and reframe.    Suicidal/Homicidal: Nowithout intent/plan  Therapist Response: Assessed pt current functioning per pt report. Processed w/pt stressors of job, thoughts of feeling stuck and relationship w/ dad.  Explored w/pt ways of planning for week once knows schedule to identify times to make steps w/ other goals.  Assisted pt in awareness of feelings w/ letting go of relationship w/ dad.  discussed w/ pt distortions and assisted w/ reframing.  Plan: Return again in 2 weeks, via webex.  Pt to f/u as scheduled w/ Dr. Montel Culver.  Diagnosis: Bipolar 1 d/o    [name] [facility] 03/29/2019

## 2019-04-12 ENCOUNTER — Ambulatory Visit (INDEPENDENT_AMBULATORY_CARE_PROVIDER_SITE_OTHER): Payer: 59 | Admitting: Psychology

## 2019-04-12 ENCOUNTER — Other Ambulatory Visit: Payer: Self-pay

## 2019-04-12 DIAGNOSIS — F3161 Bipolar disorder, current episode mixed, mild: Secondary | ICD-10-CM | POA: Diagnosis not present

## 2019-04-12 NOTE — Progress Notes (Signed)
Virtual Visit via Video Note  I connected with Kathryn [name] on 04/12/19 at  9:00 AM EST by a video enabled telemedicine application and verified that I am speaking with the correct person using two identifiers.   I discussed the limitations of evaluation and management by telemedicine and the availability of in person appointments. The patient expressed understanding and agreed to proceed.    I discussed the assessment and treatment plan with the patient. The patient was provided an opportunity to ask questions and all were answered. The patient agreed with the plan and demonstrated an understanding of the instructions.   The patient was advised to call back or seek an in-person evaluation if the symptoms worsen or if the condition fails to improve as anticipated.  I provided 34 minutes of non-face-to-face time during this encounter.   [name] [facility]    THERAPIST PROGRESS NOTE  Session Time: 9am-9.34am  Participation Level: Active  Behavioral Response: Well GroomedAlertaffect bright  Type of Therapy: Individual Therapy  Treatment Goals addressed: Diagnosis: Bipolar 1 d/o and goal 1.  Interventions: CBT and Strength-based  Summary: Kathryn [name] is a 24 y.o. female who presents with affect bright.  Pt reported that her mood has improved and feeling more engaged and able to do things.  Pt reported that she has plans to visit her family in March and April- taking time off.  Pt reported that she has been picking up work w/ [employer] and although not a lot is helping financially.  Pt reported that she is talking w/ family and friend back home and further exploring options of living to make the move. Pt feels positive about things doing and seeing pattern of when works 1st shift and follows her routine does better- pt discussed need for routine still even when works 2nd shift..   Suicidal/Homicidal: Nowithout intent/plan  Therapist Response: Assessed pt current functioning per pt  report. Processed w/pt improvement w/ mood and activity level and discussed contributing factors and ways to continue w/ momentum.  Explored w/pt her next steps and how making movement towards her moving back home goals.  Plan: Return again in 2 weeks, via webex.  F/u as scheduled w/ Dr. Montel Culver.  Diagnosis: Bipolar 1 d/o  [name] [facility] 04/12/2019

## 2019-04-26 ENCOUNTER — Other Ambulatory Visit: Payer: Self-pay

## 2019-04-26 ENCOUNTER — Ambulatory Visit (INDEPENDENT_AMBULATORY_CARE_PROVIDER_SITE_OTHER): Payer: 59 | Admitting: Psychology

## 2019-04-26 DIAGNOSIS — F3161 Bipolar disorder, current episode mixed, mild: Secondary | ICD-10-CM

## 2019-04-26 DIAGNOSIS — F411 Generalized anxiety disorder: Secondary | ICD-10-CM

## 2019-04-26 NOTE — Progress Notes (Signed)
Virtual Visit via Video Note  I connected with Kathryn [name] on 04/26/19 at  9:00 AM EST by a video enabled telemedicine application and verified that I am speaking with the correct person using two identifiers.   I discussed the limitations of evaluation and management by telemedicine and the availability of in person appointments. The patient expressed understanding and agreed to proceed.    I discussed the assessment and treatment plan with the patient. The patient was provided an opportunity to ask questions and all were answered. The patient agreed with the plan and demonstrated an understanding of the instructions.   The patient was advised to call back or seek an in-person evaluation if the symptoms worsen or if the condition fails to improve as anticipated.  I provided 47 minutes of non-face-to-face time during this encounter.   [name] [facility]    THERAPIST PROGRESS NOTE  Session Time: 9.01am-9.48am  Participation Level: Active  Behavioral Response: Well GroomedAlertaffect wnl  Type of Therapy: Individual Therapy  Treatment Goals addressed: Diagnosis: Bipolar 1 d/o, GAD and goal 1.  Interventions: CBT and Strength-based  Summary: Kathryn [name] is a 24 y.o. female who presents with affect wnl.  pt reported that she has been doing well overall, but did experience increased of anxiety over the weekend that seemed to come out of nowhere.  Pt reported that she was able to keep pushing through, acknowledging change in mood is normal and that will come to end.  Pt reported that she had some supports that helped to talk w/.  Pt reported that bothered her as had to cancel on friend who was in town for something else and had planned to [facility].  Pt worried her friend would be upset- but actually was very supportive.  Pt was able to feel good that she worked through that.  Pt was able to reflect on some things and identify some potential factors- vivid dreams, worry over mom's need for  surgery again.  Pt is able to identify that she is working towards her goals..   Suicidal/Homicidal: Nowithout intent/plan  Therapist Response: Assessed pt current functioning per pt report. Processed w/ pt coping w/ anxiety and assisted recognizing reframing and skills of acceptance that helped through. Assisted pt in recognizing some stressors and how she is approaching those as well and positive relationship and supports.  Plan: Return again in 2 weeks, via webex.  F/u as scheduled w/ Dr. Montel Culver.  Diagnosis: Bipolar 1 d/o and GAD  [name] [facility] 04/26/2019

## 2019-05-10 ENCOUNTER — Ambulatory Visit (INDEPENDENT_AMBULATORY_CARE_PROVIDER_SITE_OTHER): Payer: 59 | Admitting: Psychology

## 2019-05-10 ENCOUNTER — Other Ambulatory Visit: Payer: Self-pay

## 2019-05-10 DIAGNOSIS — F3161 Bipolar disorder, current episode mixed, mild: Secondary | ICD-10-CM

## 2019-05-10 DIAGNOSIS — F411 Generalized anxiety disorder: Secondary | ICD-10-CM

## 2019-05-10 NOTE — Progress Notes (Signed)
Virtual Visit via Video Note  I connected with Kathryn [name] on 05/10/19 at  9:00 AM EDT by a video enabled telemedicine application and verified that I am speaking with the correct person using two identifiers.   I discussed the limitations of evaluation and management by telemedicine and the availability of in person appointments. The patient expressed understanding and agreed to proceed.     I discussed the assessment and treatment plan with the patient. The patient was provided an opportunity to ask questions and all were answered. The patient agreed with the plan and demonstrated an understanding of the instructions.   The patient was advised to call back or seek an in-person evaluation if the symptoms worsen or if the condition fails to improve as anticipated.  I provided 35 minutes of non-face-to-face time during this encounter.   [name] [facility]    THERAPIST PROGRESS NOTE  Session Time: 9am-9.35am  Participation Level: Active  Behavioral Response: Well GroomedAlertaffect wnl  Type of Therapy: Individual Therapy  Treatment Goals addressed: Diagnosis: Bipolar 1 d/o and anxiety and goal 1.  Interventions: CBT and Supportive  Summary: Kathryn [name] is a 24 y.o. female who presents with affect wnl.  pt reported that this is first day off w/out travel in a week.  Pt reported that she is going to take time to get things done around house and relax.  Pt reported that she went home and was good to see family and friends.  Sister helped her look at finances and budget and planning to pay off some medical bills to up her credit score and give more options for when moving back home.  pt reported that she recognized that a lot of time when angry/frustrated she is actually feeling overwhelmed and anxious.  Pt was able to reflect on this w/ mom's surprise party and some other incidents.  Pt increased awareness of being able to express feeling and focus on what is in her control and how  she wants to respond.  Pt reported mood is good overall.  Pt reported that still struggles w/ feeling drain or exhausted in middle of the day.  Pt will f/u w/ PCP to ruleout other issues..   Suicidal/Homicidal: Nowithout intent/plan  Therapist Response: Assessed pt current functioning per pt report.  Processed w/pt steps taking towards her goals w/ moving back home.  Explored w/pt her moods and insight about anxiety being expressed as anger.  Assisted pt in skills of naming emotion and focus on what's in her control to manage through, respond.  Discussed f/u w/ PCP and psychiatrist re: fatigue concerns.  Plan: Return again in 2 weeks., via webex.  F/u as scheduled w/ Dr. Montel Culver.  F/u w/ PCP for checkup. Diagnosis: Bipolar 1 d/o, Lester Kinsman, [facility] 05/10/2019

## 2019-05-22 ENCOUNTER — Emergency Department (HOSPITAL_BASED_OUTPATIENT_CLINIC_OR_DEPARTMENT_OTHER): Payer: Managed Care, Other (non HMO)

## 2019-05-22 ENCOUNTER — Emergency Department (HOSPITAL_BASED_OUTPATIENT_CLINIC_OR_DEPARTMENT_OTHER)
Admission: EM | Admit: 2019-05-22 | Discharge: 2019-05-22 | Disposition: A | Discharge status: Home / Self Care | Payer: Managed Care, Other (non HMO) | Attending: Emergency Medicine | Admitting: Emergency Medicine

## 2019-05-22 ENCOUNTER — Other Ambulatory Visit: Payer: Self-pay

## 2019-05-22 ENCOUNTER — Encounter (HOSPITAL_BASED_OUTPATIENT_CLINIC_OR_DEPARTMENT_OTHER): Payer: Self-pay | Admitting: *Deleted

## 2019-05-22 DIAGNOSIS — Y92008 Other place in unspecified non-institutional (private) residence as the place of occurrence of the external cause: Secondary | ICD-10-CM | POA: Diagnosis not present

## 2019-05-22 DIAGNOSIS — Y999 Unspecified external cause status: Secondary | ICD-10-CM | POA: Insufficient documentation

## 2019-05-22 DIAGNOSIS — Z79899 Other long term (current) drug therapy: Secondary | ICD-10-CM | POA: Diagnosis not present

## 2019-05-22 DIAGNOSIS — R55 Syncope and collapse: Secondary | ICD-10-CM | POA: Diagnosis present

## 2019-05-22 DIAGNOSIS — F909 Attention-deficit hyperactivity disorder, unspecified type: Secondary | ICD-10-CM | POA: Diagnosis not present

## 2019-05-22 DIAGNOSIS — Y9389 Activity, other specified: Secondary | ICD-10-CM | POA: Insufficient documentation

## 2019-05-22 DIAGNOSIS — W01198A Fall on same level from slipping, tripping and stumbling with subsequent striking against other object, initial encounter: Secondary | ICD-10-CM | POA: Insufficient documentation

## 2019-05-22 DIAGNOSIS — I10 Essential (primary) hypertension: Secondary | ICD-10-CM | POA: Diagnosis not present

## 2019-05-22 DIAGNOSIS — F17210 Nicotine dependence, cigarettes, uncomplicated: Secondary | ICD-10-CM | POA: Diagnosis not present

## 2019-05-22 LAB — CBC WITH DIFFERENTIAL/PLATELET
Abs Immature Granulocytes: 0.02 10*3/uL (ref 0.00–0.07)
Basophils Absolute: 0 10*3/uL (ref 0.0–0.1)
Basophils Relative: 0 %
Eosinophils Absolute: 0.2 10*3/uL (ref 0.0–0.5)
Eosinophils Relative: 2 %
HCT: 42.8 % (ref 36.0–46.0)
Hemoglobin: 14.4 g/dL (ref 12.0–15.0)
Immature Granulocytes: 0 %
Lymphocytes Relative: 32 %
Lymphs Abs: 3 10*3/uL (ref 0.7–4.0)
MCH: 33.3 pg (ref 26.0–34.0)
MCHC: 33.6 g/dL (ref 30.0–36.0)
MCV: 99.1 fL (ref 80.0–100.0)
Monocytes Absolute: 0.8 10*3/uL (ref 0.1–1.0)
Monocytes Relative: 9 %
Neutro Abs: 5.4 10*3/uL (ref 1.7–7.7)
Neutrophils Relative %: 57 %
Platelets: 388 10*3/uL (ref 150–400)
RBC: 4.32 MIL/uL (ref 3.87–5.11)
RDW: 13.2 % (ref 11.5–15.5)
WBC: 9.5 10*3/uL (ref 4.0–10.5)
nRBC: 0 % (ref 0.0–0.2)

## 2019-05-22 LAB — RAPID URINE DRUG SCREEN, HOSP PERFORMED
Amphetamines: POSITIVE — AB
Barbiturates: NOT DETECTED
Benzodiazepines: NOT DETECTED
Cocaine: NOT DETECTED
Opiates: NOT DETECTED
Tetrahydrocannabinol: POSITIVE — AB

## 2019-05-22 LAB — COMPREHENSIVE METABOLIC PANEL
ALT: 22 U/L (ref 0–44)
AST: 22 U/L (ref 15–41)
Albumin: 4.4 g/dL (ref 3.5–5.0)
Alkaline Phosphatase: 57 U/L (ref 38–126)
Anion gap: 10 (ref 5–15)
BUN: 8 mg/dL (ref 6–20)
CO2: 26 mmol/L (ref 22–32)
Calcium: 8.9 mg/dL (ref 8.9–10.3)
Chloride: 102 mmol/L (ref 98–111)
Creatinine, Ser: 0.83 mg/dL (ref 0.44–1.00)
GFR calc Af Amer: >60 mL/min (ref >60)
GFR calc non Af Amer: >60 mL/min (ref >60)
Glucose, Bld: 66 mg/dL — ABNORMAL LOW (ref 70–99)
Potassium: 3.8 mmol/L (ref 3.5–5.1)
Sodium: 138 mmol/L (ref 135–145)
Total Bilirubin: 0.4 mg/dL (ref 0.3–1.2)
Total Protein: 7.3 g/dL (ref 6.5–8.1)

## 2019-05-22 LAB — URINALYSIS, ROUTINE W REFLEX MICROSCOPIC
Bilirubin Urine: NEGATIVE
Glucose, UA: NEGATIVE mg/dL
Hgb urine dipstick: NEGATIVE
Ketones, ur: NEGATIVE mg/dL
Leukocytes,Ua: NEGATIVE
Nitrite: NEGATIVE
Protein, ur: NEGATIVE mg/dL
Specific Gravity, Urine: 1.015 (ref 1.005–1.030)
pH: 7 (ref 5.0–8.0)

## 2019-05-22 LAB — CBG MONITORING, ED: Glucose-Capillary: 85 mg/dL (ref 70–99)

## 2019-05-22 MED ORDER — CYCLOBENZAPRINE HCL 10 MG PO TABS: 10.0000 mg | ORAL_TABLET | Freq: Two times a day (BID) | ORAL | 0 refills | Status: DC | PRN

## 2019-05-22 MED ORDER — SODIUM CHLORIDE 0.9 % IV BOLUS
1000.0000 mL | Freq: Once | INTRAVENOUS | Status: AC
  Administered 2019-05-22: 13:00:00 1000 mL via INTRAVENOUS

## 2019-05-22 MED FILL — CYCLOBENZAPRINE HCL 10 MG T: 10 | 5 days supply | Qty: 10 | Fill #0

## 2019-05-22 NOTE — Discharge Instructions (Addendum)
You have been seen today for passing out. Please read and follow all provided instructions. Return to the emergency room for worsening condition or new concerning symptoms.    1. Medications:  Prescription sent to your pharmacy for a muscle relaxer Flexeril.  This can make you drowsy so do not drive or work when taking.  Continue usual home medications Take medications as prescribed. Please review all of the medicines and only take them if you do not have an allergy to them.   2. Treatment: rest, drink plenty of fluids. Make sure you eat plenty of snacks and at least 3 meals through out the day! Check your blood sugar as needed  3. Follow Up:  Please follow up with primary care provider by scheduling an appointment as soon as possible for a visit.   It is also a possibility that you have an allergic reaction to any of the medicines that you have been prescribed - Everybody reacts differently to medications and while MOST people have no trouble with most medicines, you may have a reaction such as nausea, vomiting, rash, swelling, shortness of breath. If this is the case, please stop taking the medicine immediately and contact your physician.  ?

## 2019-05-22 NOTE — ED Triage Notes (Signed)
She fell yesterday. She is not sure if she tripped on a step going into her house. Possible LOC. She fell onto her face. Pain in the right side of her neck. Bruise to her right upper arm. She drove herself here. She is alert oriented. Ambulatory.

## 2019-05-22 NOTE — ED Provider Notes (Signed)
[place] EMERGENCY DEPARTMENT Provider Note   CSN: [id number] Arrival date & time: 05/22/19  1127     History Chief Complaint  Patient presents with  . Fall    Kathryn [name] is a 24 y.o. female with past medical history as listed below presents to emergency department today with chief complaint of possible syncopal episode x1 day ago.  Patient states she was walking in her house when she caught her boot on the threshold and fell forward hitting her face.  She thinks she might of passed out but is unsure.  She states she could hear her dogs moving around.  She checked her blood sugar after getting up and it was 86.  She is not a known diabetic but states she frequently skips meals because she is on Adderall and it suppresses her appetite as she has been giving a close eye on her blood sugar.  Since the fall she is complaining of right sided neck pain and right arm pain.  She states the pain feels like a pulled muscle and is a dull aching sensation.  She rates the pain 6 out of 10 in severity.  She has not taken any medications for symptoms prior to arrival.  She denies fever, chills, headache, visual changes, palpitations, chest pain, shortness of breath, abdominal pain, nausea, emesis, urinary symptoms, diarrhea.  She denies any personal history of cardiac disease.  Denies any family history of sudden cardiac death of anyone at a young age.  History provided by patient with additional history obtained from chart review.     Past Medical History:  Diagnosis Date  . Abnormal liver function tests 02/02/2016  . ADHD 06/10/2016  . Alcohol use 02/23/2016  . Allergy   . Anxiety   . Arm fracture, right    as child, fx in three places, no surgery  . Depression   . Depression with anxiety 2014-04-19   PTSD s/p death of best friend   . Essential hypertension 02/02/2016  . GERD (gastroesophageal reflux disease)   . 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 Headache 04/09/2016  . Hyperlipidemia, mixed  02/02/2016  . Migraine   . Ovarian cyst   . PCOS (polycystic ovarian syndrome)   . Preventative health care 10/27/2015  . Thrombocytosis ([place]) 04/09/2016  . Vaginal Pap smear, abnormal    HGSIL    Patient Active Problem List   Diagnosis Date Noted  . Cannabis use disorder, mild, abuse 08/15/2018  . GAD (generalized anxiety disorder) 08/15/2018  . Weakness of right upper extremity 07/12/2017  . Chronic vaginitis 05/05/2017  . Environmental allergies 05/05/2017  . SOM (serous otitis media) 06/15/2016  . ADHD 06/10/2016  . Headache 04/09/2016  . Thrombocytosis ([place]) 04/09/2016  . Low back pain 02/23/2016  . Essential hypertension 02/02/2016  . Hyperlipidemia, mixed 02/02/2016  . Abnormal liver function tests 02/02/2016  . Preventative health care 10/27/2015  . Dysmenorrhea 10/27/2015  . Overweight 04/15/2015  . 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 Pain in joint, ankle and foot 12/19/2014  . Insomnia 07/09/2014  . Allergic rhinitis April 19, 2014  . GERD (gastroesophageal reflux disease) Apr 19, 2014  . Bipolar 1 disorder, mixed, mild ([place]) 19-Apr-2014  . PCOS (polycystic ovarian syndrome) 04/19/2014  . Abdominal pain 19-Apr-2014    Past Surgical History:  Procedure Laterality Date  . ADENOIDECTOMY  2002  . TYMPANOSTOMY TUBE PLACEMENT Bilateral 1999     OB History    Gravida  0   Para  0   Term  0  Preterm  0   AB  0   Living  0     SAB  0   TAB  0   Ectopic  0   Multiple  0   Live Births  0           Family History  Problem Relation Age of Onset  . Hypertension Mother   . Heart disease Mother        CAD, stent at 22, s/p cardiac arrest with Defib  . Hyperlipidemia Mother   . Diabetes Mother        s/p gastric bypass  . Obesity Mother        s/p gastric bypass  . OCD Mother   . Hyperlipidemia Father   . Hypertension Father   . Anxiety disorder Father   . Asthma Sister   . Hyperlipidemia Brother   . Anxiety disorder Brother   . Stroke Maternal  Grandmother   . Thyroid disease Maternal Grandmother   . Anxiety disorder Maternal Grandmother   . Heart disease Maternal Grandfather        MI first at 33, s/p stents and CABG  . Hyperlipidemia Maternal Grandfather   . Hypertension Maternal Grandfather   . Stroke Maternal Grandfather   . Cancer Maternal Grandfather        melanoma  . Aneurysm Maternal Grandfather        brain  . Kidney disease Maternal Grandfather        tumor  . Heart disease Paternal Grandmother   . Cancer Paternal Lucilla Edin and brain  . Heart disease Maternal Uncle   . Vision loss Maternal Uncle   . Anxiety disorder Maternal Uncle   . Anxiety disorder Maternal Aunt   . Depression Maternal Aunt     Social History   Tobacco Use  . Smoking status: Heavy Tobacco Smoker    Packs/day: 1.50    Types: Cigarettes  . Smokeless tobacco: Current User    Types: Snuff  Substance Use Topics  . Alcohol use: No    Alcohol/week: 0.0 standard drinks    Comment: maybe every 6 months  . Drug use: Yes    Types: Marijuana    Comment: daily    Home Medications Prior to Admission medications   Medication Sig Start Date End Date Taking? Authorizing Provider  albuterol (PROVENTIL HFA;VENTOLIN HFA) 108 (90 Base) MCG/ACT inhaler Inhale 2 puffs into the lungs every 6 (six) hours as needed for wheezing or shortness of breath. 10/13/16   Mosie Lukes, MD  amphetamine-dextroamphetamine (ADDERALL) 15 MG tablet Take 1 tablet by mouth 3 (three) times daily. August 2020 05/01/19 05/31/19  Pucilowski, Marchia Bond, MD  amphetamine-dextroamphetamine (ADDERALL) 15 MG tablet Take 1 tablet by mouth 3 (three) times daily. July 2020 03/31/19 04/30/19  Pucilowski, Marchia Bond, MD  amphetamine-dextroamphetamine (ADDERALL) 15 MG tablet Take 1 tablet by mouth 3 (three) times daily. June 2020 02/28/19 03/30/19  Pucilowski, Marchia Bond, MD  busPIRone (BUSPAR) 15 MG tablet Take 1 tablet (15 mg total) by mouth 2 (two) times daily. 02/28/19 08/27/19   Pucilowski, Marchia Bond, MD  cyclobenzaprine (FLEXERIL) 10 MG tablet Take 1 tablet (10 mg total) by mouth 2 (two) times daily as needed for muscle spasms. 05/22/19   Kazue Cerro E, PA-C  diclofenac (CATAFLAM) 50 MG tablet Take 1 tablet (50 mg total) by mouth 3 (three) times daily as needed. 12/26/18   Lavonia Drafts, MD  famotidine (PEPCID) 20  MG tablet Take 1 tablet (20 mg total) by mouth 2 (two) times daily. Patient not taking: Reported on 12/26/2018 05/12/18   Mosie Lukes, MD  gabapentin (NEURONTIN) 300 MG capsule Take 1 capsule (300 mg total) by mouth 3 (three) times daily. 06/24/18   Mosie Lukes, MD  lamoTRIgine (LAMICTAL) 150 MG tablet Take 1 tablet (150 mg total) by mouth at bedtime. Take one a day 02/28/19 08/27/19  Pucilowski, Marchia Bond, MD  Levonorgestrel-Ethinyl Estradiol (AMETHIA) 0.15-0.03 &0.01 MG tablet Take 1 tablet by mouth daily. 12/26/18   Lavonia Drafts, MD  mometasone-formoterol (DULERA) 100-5 MCG/ACT AERO Inhale 2 puffs into the lungs 2 (two) times daily. 10/13/16   Mosie Lukes, MD  montelukast (SINGULAIR) 10 MG tablet TAKE ONE TABLET BY MOUTH EVERY NIGHT AT BEDTIME 02/21/19   Mosie Lukes, MD  omeprazole (PRILOSEC) 40 MG capsule Take 1 capsule (40 mg total) by mouth 2 (two) times daily. 06/24/18   Mosie Lukes, MD  ondansetron (ZOFRAN) 4 MG tablet Take 1 tablet (4 mg total) by mouth every 8 (eight) hours as needed for nausea or vomiting. Patient not taking: Reported on 12/26/2018 03/17/18   Shelda Pal, DO  valACYclovir (VALTREX) 1000 MG tablet Take 1 tablet (1,000 mg total) by mouth daily. 12/26/18   Lavonia Drafts, MD  venlafaxine XR (EFFEXOR-XR) 75 MG 24 hr capsule Take 1 capsule (75 mg total) by mouth daily with breakfast. 02/28/19 08/27/19  Pucilowski, Marchia Bond, MD  zolpidem (AMBIEN) 10 MG tablet Take 1 tablet (10 mg total) by mouth at bedtime as needed for sleep. 02/28/19 05/29/19  Pucilowski, Marchia Bond, MD    Allergies      Oxycodone and Seroquel [quetiapine fumarate]  Review of Systems   Review of Systems  All other systems are reviewed and are negative for acute change except as noted in the HPI.   Physical Exam Updated Vital Signs BP 133/87   Pulse 77   Temp 98.8 F (37.1 C) (Oral)   Resp (!) 24   Ht 5' 8.5" (1.74 m)   Wt 66.7 kg   SpO2 100%   BMI 22.03 kg/m   Physical Exam Vitals and nursing note reviewed.  Constitutional:      General: She is not in acute distress.    Appearance: She is not ill-appearing.  HENT:     Head: Normocephalic and atraumatic.     Comments: No tenderness to palpation of skull. No deformities or crepitus noted. No open wounds, abrasions or lacerations.    Right Ear: Tympanic membrane and external ear normal.     Left Ear: Tympanic membrane and external ear normal.     Nose: Nose normal.     Mouth/Throat:     Mouth: Mucous membranes are moist.     Pharynx: Oropharynx is clear.  Eyes:     General: No scleral icterus.       Right eye: No discharge.        Left eye: No discharge.     Extraocular Movements: Extraocular movements intact.     Conjunctiva/sclera: Conjunctivae normal.     Pupils: Pupils are equal, round, and reactive to light.  Neck:     Vascular: No JVD.      Comments: Tenderness to palpation as depicted in image above.  No overlying skin changes.  No midline cervical tenderness.  Full range of motion of neck.  No muscle spasms felt.  No step-off or deformity.  No meningeal signs. Cardiovascular:  Rate and Rhythm: Normal rate and regular rhythm.     Pulses: Normal pulses.          Radial pulses are 2+ on the right side and 2+ on the left side.     Heart sounds: Normal heart sounds.  Pulmonary:     Comments: Lungs clear to auscultation in all fields. Symmetric chest rise. No wheezing, rales, or rhonchi. Chest:     Chest wall: No tenderness.  Abdominal:     Comments: Abdomen is soft, non-distended, and non-tender in all quadrants. No  rigidity, no guarding. No peritoneal signs.  Musculoskeletal:        General: Normal range of motion.     Cervical back: Normal range of motion.     Comments: Moving all extremities without any signs of injuries.  Full range of motion of the T-spine and L-spine No tenderness to palpation of the spinous processes of the T-spine or L-spine No crepitus, deformity or step-offs No tenderness to palpation of the paraspinous muscles of the L-spine    Full range of motion of right shoulder.  No deformity, effusion, wound noted.  No bony tenderness to palpation.  Full range of motion of right elbow and wrist. Neurovascularly intact distally. Compartments soft above and below affected joint.     Skin:    General: Skin is warm and dry.     Capillary Refill: Capillary refill takes less than 2 seconds.  Neurological:     Mental Status: She is oriented to person, place, and time.     GCS: GCS eye subscore is 4. GCS verbal subscore is 5. GCS motor subscore is 6.     Comments: Fluent speech, no facial droop.  Psychiatric:        Behavior: Behavior normal.     ED Results / Procedures / Treatments   Labs (all labs ordered are listed, but only abnormal results are displayed) Labs Reviewed  COMPREHENSIVE METABOLIC PANEL - Abnormal; Notable for the following components:      Result Value   Glucose, Bld 66 (*)    All other components within normal limits  RAPID URINE DRUG SCREEN, HOSP PERFORMED - Abnormal; Notable for the following components:   Amphetamines POSITIVE (*)    Tetrahydrocannabinol POSITIVE (*)    All other components within normal limits  CBC WITH DIFFERENTIAL/PLATELET  URINALYSIS, ROUTINE W REFLEX MICROSCOPIC  CBG MONITORING, ED    EKG EKG Interpretation  Date/Time:  Monday May 22 2019 12:56:09 EDT Ventricular Rate:  78 PR Interval:    QRS Duration: 89 QT Interval:  371 QTC Calculation: 423 R Axis:   88 Text Interpretation: Sinus rhythm Short PR interval No  significant change since last tracing Confirmed by Karle Starch  MD, CHARLES [phone]) on 05/22/2019 12:59:31 PM   Radiology DG Cervical Spine Complete  Result Date: 05/22/2019 CLINICAL DATA:  Neck pain since a fall 2 days ago.  The EXAM: CERVICAL SPINE - COMPLETE 4+ VIEW COMPARISON:  None. FINDINGS: There is no evidence of cervical spine fracture or prevertebral soft tissue swelling. Alignment is normal. No other significant bone abnormalities are identified. IMPRESSION: Negative cervical spine radiographs. Electronically Signed   By: Lorriane Shire M.D.   On: 05/22/2019 12:54    Procedures Procedures (including critical care time)  Medications Ordered in ED Medications  sodium chloride 0.9 % bolus 1,000 mL ( Intravenous Stopped 05/22/19 1424)    ED Course  I have reviewed the triage vital signs and the nursing notes.  Pertinent  labs & imaging results that were available during my care of the patient were reviewed by me and considered in my medical decision making (see chart for details).    MDM Rules/Calculators/A&P                      Patient seen and examined. Patient presents awake, alert, hemodynamically stable, afebrile, non toxic.  Patient presents after possible syncopal episode versus mechanical fall.  She is well-appearing, no acute distress.  On exam She has tenderness to palpation of right paraspinal muscles.  No cervical midline tenderness, no spasm felt.  She is moving all extremities without any signs of injury.  Right upper extremity is benign. Radial pulses 2+ bilaterally.  Strong and equal grip strength in bilateral upper extremities.  Neuro exam is normal.  It appears patient is at baseline.  No signs of serious head or neck injury.  Labs here show no leukocytosis, no anemia.  No severe electrolyte derangement, no renal insufficiency.  She was found to be hypoglycemic with glucose of 66.  Patient given a snack and on recheck it was 85.  UA is negative for infection.  Urine  pregnancy test is negative.  UDS is positive for amphetamines and Tetrahydrocannabinol.  EKG shows normal sinus rhythm.  She does have short PR interval but that is unchanged from prior.  No obvious Wolff-Parkinson-White or other concerning arrhythmias.  She denies any associated chest pain or cardiac symptoms.  No family history of sudden cardiac death.  Will prescribe patient Flexeril for muscle pain given the soreness she has after the fall.  She admits she has taken this in the past without any adverse reaction.  The patient appears reasonably screened and/or stabilized for discharge and I doubt any other medical condition or other [facility] requiring further screening, evaluation, or treatment in the ED at this time prior to discharge. The patient is safe for discharge with strict return precautions discussed. Recommend pcp follow up for further evaluation. Findings and plan of care discussed with supervising physician Dr. Karle Starch.   Portions of this note were generated with [employer]. Dictation errors may occur despite best attempts at proofreading.   Final Clinical Impression(s) / ED Diagnoses Final diagnoses:  Syncope, unspecified syncope type    Rx / DC Orders ED Discharge Orders         Ordered    cyclobenzaprine (FLEXERIL) 10 MG tablet  2 times daily PRN     05/22/19 1420           Dannel Rafter, [place], PA-C 05/22/19 1447    Truddie Hidden, MD 05/22/19 1640

## 2019-05-22 NOTE — ED Notes (Signed)
Pt on monitor 

## 2019-05-23 ENCOUNTER — Other Ambulatory Visit: Payer: Self-pay | Admitting: Family Medicine

## 2019-05-23 DIAGNOSIS — J029 Acute pharyngitis, unspecified: Secondary | ICD-10-CM

## 2019-05-29 ENCOUNTER — Ambulatory Visit (INDEPENDENT_AMBULATORY_CARE_PROVIDER_SITE_OTHER): Payer: 59 | Admitting: Psychiatry

## 2019-05-29 ENCOUNTER — Other Ambulatory Visit: Payer: Self-pay

## 2019-05-29 DIAGNOSIS — F411 Generalized anxiety disorder: Secondary | ICD-10-CM | POA: Diagnosis not present

## 2019-05-29 DIAGNOSIS — F3178 Bipolar disorder, in full remission, most recent episode mixed: Secondary | ICD-10-CM | POA: Diagnosis not present

## 2019-05-29 DIAGNOSIS — F988 Other specified behavioral and emotional disorders with onset usually occurring in childhood and adolescence: Secondary | ICD-10-CM | POA: Diagnosis not present

## 2019-05-29 DIAGNOSIS — F900 Attention-deficit hyperactivity disorder, predominantly inattentive type: Secondary | ICD-10-CM | POA: Diagnosis not present

## 2019-05-29 MED ORDER — AMPHETAMINE-DEXTROAMPHETAMINE 15 MG PO TABS: 15.0000 mg | ORAL_TABLET | Freq: Three times a day (TID) | ORAL | 0 refills | Status: DC

## 2019-05-29 MED ORDER — ZOLPIDEM TARTRATE 10 MG PO TABS: 10.0000 mg | ORAL_TABLET | Freq: Every evening | ORAL | 2 refills | Status: DC | PRN

## 2019-05-29 NOTE — Progress Notes (Signed)
[place] MD/PA/NP OP Progress Note  05/29/2019 10:09 AM Kathryn [name]  MRN:  [id number] Interview was conducted by phone and I verified that I was speaking with the correct person using two identifiers. I discussed the limitations of evaluation and management by telemedicine and  the availability of in person appointments. Patient expressed understanding and agreed to proceed.  Chief Complaint: "I am doing well".  HPI: 24 yo single white female with bipolar 1 disorder, ADHD and GAD (also hx of cannabis use disorder). She has been on venlafaxine/buspirone for a long time for anxiety/depression. Dr. De Nurse added lamotrigine for mood stabilization and she has been recently taking 150 mg at bedtime. She also takes Adderall 15mg  bid for ADD plus an additional PM dose on occasion when she needs to work longer. In late May Kathryn [name] started feeling more depressed and admitted to having passive SI. She entered IOP on 6/620 and completed it on 08/06/18. Her mood improved and she denies having any further SI. Sleep is good when she takes 10mg  of zolpidem (not every night). She has no hx of psychotic sx. In the past she tried XR for of Adderall but did not judge it to be effective hence switch to [facility] better. Kathryn [name] has a hx of cannabis abuse.She continues counseling with [name].  Visit Diagnosis:    ICD-10-CM   1. Attention deficit hyperactivity disorder (ADHD), predominantly inattentive type  F90.0   2. Attention deficit disorder, unspecified hyperactivity presence  F98.8 amphetamine-dextroamphetamine (ADDERALL) 15 MG tablet    amphetamine-dextroamphetamine (ADDERALL) 15 MG tablet    amphetamine-dextroamphetamine (ADDERALL) 15 MG tablet  3. GAD (generalized anxiety disorder)  F41.1   4. Bipolar 1 disorder, mixed, full remission ([place])  F31.78     Past Psychiatric History: Please see intake H&P.  Past Medical History:  Past Medical History:  Diagnosis Date  . Abnormal liver function tests  02/02/2016  . ADHD 06/10/2016  . Alcohol use 02/23/2016  . Allergy   . Anxiety   . Arm fracture, right    as child, fx in three places, no surgery  . Depression   . Depression with anxiety 2014/05/02   PTSD s/p death of best friend   . Essential hypertension 02/02/2016  . GERD (gastroesophageal reflux disease)   . 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 Headache 04/09/2016  . Hyperlipidemia, mixed 02/02/2016  . Migraine   . Ovarian cyst   . PCOS (polycystic ovarian syndrome)   . Preventative health care 10/27/2015  . Thrombocytosis ([place]) 04/09/2016  . Vaginal Pap smear, abnormal    HGSIL    Past Surgical History:  Procedure Laterality Date  . ADENOIDECTOMY  2002  . TYMPANOSTOMY TUBE PLACEMENT Bilateral 1999    Family Psychiatric History: Reviewed.  Family History:  Family History  Problem Relation Age of Onset  . Hypertension Mother   . Heart disease Mother        CAD, stent at 87, s/p cardiac arrest with Defib  . Hyperlipidemia Mother   . Diabetes Mother        s/p gastric bypass  . Obesity Mother        s/p gastric bypass  . OCD Mother   . Hyperlipidemia Father   . Hypertension Father   . Anxiety disorder Father   . Asthma Sister   . Hyperlipidemia Brother   . Anxiety disorder Brother   . Stroke Maternal Grandmother   . Thyroid disease Maternal Grandmother   . Anxiety disorder Maternal Grandmother   . Heart  disease Maternal Grandfather        MI first at 65, s/p stents and CABG  . Hyperlipidemia Maternal Grandfather   . Hypertension Maternal Grandfather   . Stroke Maternal Grandfather   . Cancer Maternal Grandfather        melanoma  . Aneurysm Maternal Grandfather        brain  . Kidney disease Maternal Grandfather        tumor  . Heart disease Paternal Grandmother   . Cancer Paternal Lucilla Edin and brain  . Heart disease Maternal Uncle   . Vision loss Maternal Uncle   . Anxiety disorder Maternal Uncle   . Anxiety disorder Maternal Aunt   . Depression  Maternal Aunt     Social History:  Social History   Socioeconomic History  . Marital status: Single    Spouse name: Not on file  . Number of children: 0  . Years of education: 69  . Highest education level: Not on file  Occupational History    Comment: [employer]  Tobacco Use  . Smoking status: Heavy Tobacco Smoker    Packs/day: 1.50    Types: Cigarettes  . Smokeless tobacco: Current User    Types: Snuff  Substance and Sexual Activity  . Alcohol use: No    Alcohol/week: 0.0 standard drinks    Comment: maybe every 6 months  . Drug use: Yes    Types: Marijuana    Comment: daily  . Sexual activity: Yes    Birth control/protection: Pill  Other Topics Concern  . Not on file  Social History Narrative  . Not on file   Social Determinants of Health   Financial Resource Strain:   . Difficulty of Paying Living Expenses:   Food Insecurity:   . Worried About Charity fundraiser in the Last Year:   . Arboriculturist in the Last Year:   Transportation Needs:   . Film/video editor (Medical):   Marland Kitchen Lack of Transportation (Non-Medical):   Physical Activity:   . Days of Exercise per Week:   . Minutes of Exercise per Session:   Stress:   . Feeling of Stress :   Social Connections:   . Frequency of Communication with Friends and Family:   . Frequency of Social Gatherings with Friends and Family:   . Attends Religious Services:   . Active Member of Clubs or Organizations:   . Attends Archivist Meetings:   Marland Kitchen Marital Status:     Allergies:  Allergies  Allergen Reactions  . Oxycodone Nausea Only    Oxycontin also  . Seroquel [Quetiapine Fumarate]     Mental status changes    Metabolic Disorder Labs: Lab Results  Component Value Date   HGBA1C 5.2 11/17/2018   No results found for: PROLACTIN Lab Results  Component Value Date   CHOL 223 (H) 11/17/2018   TRIG 55 11/17/2018   HDL 48 11/17/2018   CHOLHDL 4.6 (H) 11/17/2018   VLDL 21.4 05/05/2017    LDLCALC 166 (H) 11/17/2018   LDLCALC 155 (H) 05/05/2017   Lab Results  Component Value Date   TSH 0.735 11/17/2018   TSH 2.49 05/05/2017    Therapeutic Level Labs: No results found for: LITHIUM No results found for: VALPROATE No components found for:  CBMZ  Current Medications: Current Outpatient Medications  Medication Sig Dispense Refill  . albuterol (PROVENTIL HFA;VENTOLIN HFA) 108 (90 Base) MCG/ACT inhaler Inhale  2 puffs into the lungs every 6 (six) hours as needed for wheezing or shortness of breath. 3 Inhaler 0  . [START ON 05/31/2019] amphetamine-dextroamphetamine (ADDERALL) 15 MG tablet Take 1 tablet by mouth 3 (three) times daily. August 2020 90 tablet 0  . [START ON 06/30/2019] amphetamine-dextroamphetamine (ADDERALL) 15 MG tablet Take 1 tablet by mouth 3 (three) times daily. July 2020 90 tablet 0  . [START ON 07/31/2019] amphetamine-dextroamphetamine (ADDERALL) 15 MG tablet Take 1 tablet by mouth 3 (three) times daily. June 2020 90 tablet 0  . busPIRone (BUSPAR) 15 MG tablet Take 1 tablet (15 mg total) by mouth 2 (two) times daily. 180 tablet 1  . cyclobenzaprine (FLEXERIL) 10 MG tablet Take 1 tablet (10 mg total) by mouth 2 (two) times daily as needed for muscle spasms. 10 tablet 0  . diclofenac (CATAFLAM) 50 MG tablet Take 1 tablet (50 mg total) by mouth 3 (three) times daily as needed. 30 tablet 2  . famotidine (PEPCID) 20 MG tablet Take 1 tablet (20 mg total) by mouth 2 (two) times daily. (Patient not taking: Reported on 12/26/2018) 180 tablet 0  . gabapentin (NEURONTIN) 300 MG capsule Take 1 capsule (300 mg total) by mouth 3 (three) times daily. 270 capsule 3  . lamoTRIgine (LAMICTAL) 150 MG tablet Take 1 tablet (150 mg total) by mouth at bedtime. Take one a day 90 tablet 1  . Levonorgestrel-Ethinyl Estradiol (AMETHIA) 0.15-0.03 &0.01 MG tablet Take 1 tablet by mouth daily. 91 tablet 4  . mometasone-formoterol (DULERA) 100-5 MCG/ACT AERO Inhale 2 puffs into the lungs 2 (two)  times daily. 3 Inhaler 1  . montelukast (SINGULAIR) 10 MG tablet TAKE ONE TABLET BY MOUTH EVERY NIGHT AT BEDTIME 90 tablet 0  . omeprazole (PRILOSEC) 40 MG capsule Take 1 capsule (40 mg total) by mouth 2 (two) times daily. 180 capsule 3  . ondansetron (ZOFRAN) 4 MG tablet Take 1 tablet (4 mg total) by mouth every 8 (eight) hours as needed for nausea or vomiting. (Patient not taking: Reported on 12/26/2018) 20 tablet 0  . valACYclovir (VALTREX) 1000 MG tablet Take 1 tablet (1,000 mg total) by mouth daily. 90 tablet 4  . venlafaxine XR (EFFEXOR-XR) 75 MG 24 hr capsule Take 1 capsule (75 mg total) by mouth daily with breakfast. 90 capsule 1  . zolpidem (AMBIEN) 10 MG tablet Take 1 tablet (10 mg total) by mouth at bedtime as needed for sleep. 30 tablet 2   No current facility-administered medications for this visit.     Psychiatric Specialty Exam: Review of Systems  All other systems reviewed and are negative.   There were no vitals taken for this visit.There is no height or weight on file to calculate BMI.  General Appearance: NA  Eye Contact:  NA  Speech:  Clear and Coherent and Normal Rate  Volume:  Normal  Mood:  Euthymic  Affect:  NA  Thought Process:  Goal Directed and Linear  Orientation:  Full (Time, Place, and Person)  Thought Content: Logical   Suicidal Thoughts:  No  Homicidal Thoughts:  No  Memory:  Immediate;   Good Recent;   Good Remote;   Good  Judgement:  Good  Insight:  Good  Psychomotor Activity:  NA  Concentration:  Concentration: Good  Recall:  Good  Fund of Knowledge: Good  Language: Good  Akathisia:  Negative  Handed:  Right  AIMS (if indicated): not done  Assets:  Communication Skills Desire for Improvement Financial Resources/Insurance Housing Talents/Skills  ADL's:  Intact  Cognition: WNL  Sleep:  Fair   Screenings: PHQ2-9     Office Visit from 08/22/2015 in [employer] at [employer] Total Score  5  PHQ-9 Total  Score  19       Assessment and Plan: 24 yo single white female with bipolar 1 disorder, ADHD and GAD (also hx of cannabis use disorder). She has been on venlafaxine/buspirone for a long time for anxiety/depression. Dr. De Nurse added lamotrigine for mood stabilization and she has been recently taking 150 mg at bedtime. She also takes Adderall 15mg  bid for ADD plus an additional PM dose on occasion when she needs to work longer. In late May Ellianne started feeling more depressed and admitted to having passive SI. She entered IOP on 6/620 and completed it on 08/06/18. Her mood improved and she denies having any further SI. Sleep is good when she takes 10mg  of zolpidem (not every night). She has no hx of psychotic sx. In the past she tried XR for of Adderall but did not judge it to be effective hence switch to [facility] better. Dreana has a hx of cannabis abuse.She continues counseling with [name].  Dx: ADD; Bipolar I in remission; GAD  Plan: We willcontinueLamictal to 150 mg at HS,buspirone 15 mg bid,Effexor XR 75 mg daily,Ambiento 10mg  prn sleep(does not use it nightly) andAdderall IR15 mg tid (she will use third dose as needed). The plan was discussed with patient who had an opportunity to ask questions and these were all answered. I spend25minutes in phone consultation with the patient.    Stephanie Acre, MD 05/29/2019, 10:09 AM

## 2019-05-31 ENCOUNTER — Ambulatory Visit (INDEPENDENT_AMBULATORY_CARE_PROVIDER_SITE_OTHER): Payer: 59 | Admitting: Psychology

## 2019-05-31 ENCOUNTER — Other Ambulatory Visit: Payer: Self-pay

## 2019-05-31 DIAGNOSIS — F3161 Bipolar disorder, current episode mixed, mild: Secondary | ICD-10-CM | POA: Diagnosis not present

## 2019-05-31 NOTE — Progress Notes (Signed)
Virtual Visit via Video Note  I connected with Kathryn [name] on 05/31/19 at  9:00 AM EDT by a video enabled telemedicine application and verified that I am speaking with the correct person using two identifiers.   I discussed the limitations of evaluation and management by telemedicine and the availability of in person appointments. The patient expressed understanding and agreed to proceed.  History of Present Illness:    Observations/Objective:   Assessment and Plan:   Follow Up Instructions:    I discussed the assessment and treatment plan with the patient. The patient was provided an opportunity to ask questions and all were answered. The patient agreed with the plan and demonstrated an understanding of the instructions.   The patient was advised to call back or seek an in-person evaluation if the symptoms worsen or if the condition fails to improve as anticipated.  I provided 47 minutes of non-face-to-face time during this encounter.   Kathryn [name] [facility]    THERAPIST PROGRESS NOTE  Session Time: 9.02am-9.49am  Participation Level: Active  Behavioral Response: Well GroomedAlertaffect wnl  Type of Therapy: Individual Therapy  Treatment Goals addressed: Diagnosis: Bipolar 1 d/o and goal 1.  Interventions: CBT and Strength-based  Summary: Kathryn [name] is a 24 y.o. female who presents with affect wnl.  pt reported that broke her wrist at work this week and out on worker's comp.  Pt reported that f/u w/ ortho next week to remove caste and reevaluate.  Pt reported that opportunity for another job has come up and pt is considering.  Pt feels good about making more from current job as dissatisfied.  Pt reported that has been able to visit w/her family- bestfriend and feels good about getting there monthly over past 3  Months and plans for next month as well.  Pt discussed support given support to another dealing w/ mental health issues and reminder of things she needs to  continue for herself.  Pt discussed thoughts of writing to dad to express thoughts and feelings.  Pt has refrained as aware that telling off might feel good but might not be beneficial and other ways could express more assertively w/ potential better outcomes..   Suicidal/Homicidal: Nowithout intent/plan  Therapist Response: Assessed pt current functioning per pt report.  Processed w/pt mood, job, family interactions.  discussed pt coping skills and self care reminders and continued growth.  Processed ways of communicating w/ dad and assertive vs. Aggressive/passive aggressive communication and potential impacts.  Plan: Return again in 2 weeks, via webex.    Diagnosis: Bipolar d/o   Kathryn [name] [facility] 05/31/2019

## 2019-06-14 ENCOUNTER — Ambulatory Visit (INDEPENDENT_AMBULATORY_CARE_PROVIDER_SITE_OTHER): Payer: 59 | Admitting: Psychology

## 2019-06-14 ENCOUNTER — Other Ambulatory Visit: Payer: Self-pay

## 2019-06-14 DIAGNOSIS — F3161 Bipolar disorder, current episode mixed, mild: Secondary | ICD-10-CM | POA: Diagnosis not present

## 2019-06-14 NOTE — Progress Notes (Signed)
Virtual Visit via Video Note  I connected with Kathryn [name] on 06/14/19 at  9:00 AM EDT by a video enabled telemedicine application and verified that I am speaking with the correct person using two identifiers.   I discussed the limitations of evaluation and management by telemedicine and the availability of in person appointments. The patient expressed understanding and agreed to proceed.    I discussed the assessment and treatment plan with the patient. The patient was provided an opportunity to ask questions and all were answered. The patient agreed with the plan and demonstrated an understanding of the instructions.   The patient was advised to call back or seek an in-person evaluation if the symptoms worsen or if the condition fails to improve as anticipated.  I provided 43 minutes of non-face-to-face time during this encounter.   [name] [facility]    THERAPIST PROGRESS NOTE  Session Time: 9am-9.43am  Participation Level: Active  Behavioral Response: Well GroomedAlertaffect wnl  Type of Therapy: Individual Therapy  Treatment Goals addressed: Diagnosis: Bipolar 1 d/o and goal 1.  Interventions: CBT and Other: Mindfulness and goal 1.  Summary: Kathryn [name] is a 24 y.o. female who presents with affect wnl.  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  Pt reported it has been nice and enjoying her time. Pt reported other job opportunity isn't going to work out and will continue looking for job.  Pt reported that she continues to struggle w/ motivation and other times just tired or can't keep eyes open.  Pt discussed ways of setting alarms etc.  Pt receptive to use of mindfulness practice to increase present focus.  Pt discussed decisions re: contact w/ dad and evaluated that wouldn't be wise to just show up at his girlfriends house. Pt reported keeping engaged well w/ family and supports.  Pt will consider options for  transition of her care.    Suicidal/Homicidal: Nowithout intent/plan  Therapist Response: Assessed pt current functioning per pt report.  Processed w/pt coping w/ motivation struggles and present focus awareness.  discussed use of mindfulness skills and ways of practicing for self.  Processed thoughts about contact w/ dad and ways- what wanting to accomplish and whether actions would result in desired outcomes.  Plan: Return again in 2 weeks, via webex.  F/u as scheduled w/ dr. Montel Culver.  Informed pt of counselor's last day on 07/06/19 and transition of care.  Diagnosis: Bipolar 1 d/o   [name] [facility] 06/14/2019

## 2019-06-28 ENCOUNTER — Other Ambulatory Visit: Payer: Self-pay

## 2019-06-28 ENCOUNTER — Ambulatory Visit (HOSPITAL_COMMUNITY): Payer: 59 | Admitting: Psychology

## 2019-07-04 ENCOUNTER — Other Ambulatory Visit: Payer: Self-pay | Admitting: Family Medicine

## 2019-07-04 ENCOUNTER — Other Ambulatory Visit: Payer: Self-pay

## 2019-07-04 DIAGNOSIS — R6889 Other general symptoms and signs: Secondary | ICD-10-CM

## 2019-07-04 DIAGNOSIS — J029 Acute pharyngitis, unspecified: Secondary | ICD-10-CM

## 2019-07-04 MED ORDER — MONTELUKAST SODIUM 10 MG PO TABS: 10.0000 mg | ORAL_TABLET | Freq: Every day | ORAL | 0 refills | Status: DC

## 2019-07-04 MED ORDER — ONDANSETRON HCL 4 MG PO TABS: 4.0000 mg | ORAL_TABLET | Freq: Three times a day (TID) | ORAL | 0 refills | Status: DC | PRN

## 2019-07-05 ENCOUNTER — Telehealth (HOSPITAL_COMMUNITY): Payer: Self-pay

## 2019-07-05 NOTE — Telephone Encounter (Signed)
Patient called and LVM requesting refills on her medication but didn't specify what she wanted refilled. Tried to return patient's call but she didn't pick up. LVM to call me back

## 2019-07-12 ENCOUNTER — Ambulatory Visit (HOSPITAL_COMMUNITY): Payer: 59 | Admitting: Psychology

## 2019-07-31 ENCOUNTER — Ambulatory Visit: Payer: Managed Care, Other (non HMO)

## 2019-08-23 ENCOUNTER — Ambulatory Visit (HOSPITAL_COMMUNITY): Payer: 59 | Admitting: Psychiatry

## 2019-08-25 ENCOUNTER — Telehealth (HOSPITAL_COMMUNITY): Payer: Self-pay | Admitting: *Deleted

## 2019-08-25 ENCOUNTER — Other Ambulatory Visit (HOSPITAL_COMMUNITY): Payer: Self-pay | Admitting: *Deleted

## 2019-08-25 MED ORDER — VENLAFAXINE HCL ER 75 MG PO CP24: 75.0000 mg | ORAL_CAPSULE | Freq: Every day | ORAL | 0 refills | Status: DC

## 2019-08-25 MED ORDER — LAMOTRIGINE 150 MG PO TABS: 150.0000 mg | ORAL_TABLET | Freq: Every day | ORAL | 0 refills | Status: DC

## 2019-08-25 NOTE — Telephone Encounter (Signed)
Thank you for update and initiative!

## 2019-08-25 NOTE — Telephone Encounter (Signed)
Pt called requesting refills on both the Effexor and Lamictal. Writer informed pt she needs to make an appointment, pt says she just lost her insurance and is working on getting a new carrier. Writer refilled Rx's for #30 with no refills. Pt may have to transfer care to [facility]. Pt says she will call with updated insurance information when, if, she gets it.

## 2019-09-07 ENCOUNTER — Other Ambulatory Visit (HOSPITAL_COMMUNITY): Payer: Self-pay | Admitting: Psychiatry

## 2019-09-07 MED ORDER — AMPHETAMINE-DEXTROAMPHETAMINE 15 MG PO TABS: 15.0000 mg | ORAL_TABLET | Freq: Three times a day (TID) | ORAL | 0 refills | Status: DC

## 2019-10-09 ENCOUNTER — Other Ambulatory Visit: Payer: Self-pay | Admitting: Family Medicine

## 2019-11-01 ENCOUNTER — Telehealth (HOSPITAL_COMMUNITY): Payer: Self-pay

## 2019-11-01 NOTE — Telephone Encounter (Signed)
Patient called and stated that she needs refills on her Adderall, Lamotrigine, and her Venlafaxine XR. She also stated that she lost her insurance so I referred her to [facility]. I also called her pharmacy and they supplied her with a 1 week supply of the Lamotrigine and Venlafaxine XR at no charge. Her Adderall can't be filled until 9/19. She's calling [facility] tomorrow to get set up with them for now. (with a good RX card her Adderall would be $35.00-$39.00 out of pocket if necessary. Wasn't quoted prices on the Lamotrigine or Venlafaxine XR)

## 2019-11-09 ENCOUNTER — Telehealth (HOSPITAL_COMMUNITY): Payer: Self-pay | Admitting: *Deleted

## 2019-11-09 ENCOUNTER — Other Ambulatory Visit (HOSPITAL_COMMUNITY): Payer: Self-pay | Admitting: Psychiatry

## 2019-11-09 MED ORDER — LAMOTRIGINE 150 MG PO TABS: 150.0000 mg | ORAL_TABLET | Freq: Every day | ORAL | 1 refills | Status: DC

## 2019-11-09 MED ORDER — VENLAFAXINE HCL ER 75 MG PO CP24: 75.0000 mg | ORAL_CAPSULE | Freq: Every day | ORAL | 1 refills | Status: DC

## 2019-11-09 NOTE — Telephone Encounter (Signed)
Pt called again today asking for refills on the Effexor and Lamictal as her appointment at [facility] is not until 12/20/19 with Burt Ek, NP. Please review.

## 2019-11-09 NOTE — Telephone Encounter (Signed)
I gave her two monthly refills of each.

## 2019-12-13 ENCOUNTER — Other Ambulatory Visit: Payer: Self-pay

## 2019-12-13 ENCOUNTER — Telehealth (HOSPITAL_COMMUNITY): Payer: Self-pay

## 2019-12-13 ENCOUNTER — Other Ambulatory Visit (HOSPITAL_COMMUNITY): Payer: Self-pay | Admitting: Psychiatry

## 2019-12-13 ENCOUNTER — Ambulatory Visit (INDEPENDENT_AMBULATORY_CARE_PROVIDER_SITE_OTHER): Payer: Self-pay | Admitting: Obstetrics & Gynecology

## 2019-12-13 ENCOUNTER — Other Ambulatory Visit (HOSPITAL_COMMUNITY)
Admission: RE | Admit: 2019-12-13 | Discharge: 2019-12-13 | Disposition: A | Discharge status: Home / Self Care | Payer: Self-pay | Source: Ambulatory Visit | Attending: Obstetrics & Gynecology | Admitting: Obstetrics & Gynecology

## 2019-12-13 ENCOUNTER — Encounter: Payer: Self-pay | Admitting: Obstetrics & Gynecology

## 2019-12-13 VITALS — BP 146/90 | HR 100 | Ht 68.5 in | Wt 161.0 lb

## 2019-12-13 DIAGNOSIS — N341 Nonspecific urethritis: Secondary | ICD-10-CM

## 2019-12-13 LAB — POCT URINALYSIS DIPSTICK
Bilirubin, UA: NEGATIVE
Glucose, UA: NEGATIVE
Ketones, UA: NEGATIVE
Leukocytes, UA: NEGATIVE
Nitrite, UA: NEGATIVE
Protein, UA: NEGATIVE
Spec Grav, UA: 1.01 (ref 1.010–1.025)
Urobilinogen, UA: 0.2 E.U./dL
pH, UA: 7.5 (ref 5.0–8.0)

## 2019-12-13 MED ORDER — AMPHETAMINE-DEXTROAMPHETAMINE 15 MG PO TABS: 15.0000 mg | ORAL_TABLET | Freq: Three times a day (TID) | ORAL | 0 refills | Status: DC

## 2019-12-13 MED ORDER — DOXYCYCLINE HYCLATE 100 MG PO CAPS: 100.0000 mg | ORAL_CAPSULE | Freq: Two times a day (BID) | ORAL | 0 refills | Status: DC

## 2019-12-13 NOTE — Addendum Note (Signed)
Addended by: Huey Bienenstock on: 12/13/2019 02:40 PM   Modules accepted: Orders

## 2019-12-13 NOTE — Telephone Encounter (Signed)
Patient called requesting a refill on her Adderall 15mg . It was sent to [facility] on [address] in [place] last time. No future appointment scheduled. Thank you.

## 2019-12-13 NOTE — Progress Notes (Signed)
History:  24 y.o. G0P0000 here today for swelling and burning vaginally. Pt took pics of a swollen urethra. She reports that it feels like she has a tampon put in partially. Pt reports that she has not been sexually active for >1 year. She had not noted blood in her urine but, has had blood when she wipes from the area.      The following portions of the patient's history were reviewed and updated as appropriate: allergies, current medications, past family history, past medical history, past social history, past surgical history and problem list.  Review of Systems:  Pertinent items are noted in HPI.    Objective:  Physical Exam Blood pressure (!) 146/90, pulse 100, height 5' 8.5" (1.74 m), weight 161 lb (73 kg).  CONSTITUTIONAL: Well-developed, well-nourished female in no acute distress.  HENT:  Normocephalic, atraumatic EYES: Conjunctivae and EOM are normal. No scleral icterus.  NECK: Normal range of motion SKIN: Skin is warm and dry. No rash noted. Not diaphoretic.No pallor. [place]: Alert and oriented to person, place, and time. Normal coordination.  Abd: Soft, nontender and nondistended Pelvic: There is mild but, obvious swelling of the urethra. He urethra is also friable. There is no LA or other abnormalities.   Labs and Imaging UA mod blood   Assessment & Plan:  Urethritis- etiology unknown.   Send UA for cx  STI screen  Doxycycline 100mg  bid x 7 days  F/u in 7 days or sooner prn  Kathryn [name], M.D., [name]

## 2019-12-13 NOTE — Patient Instructions (Signed)
Urethritis, Adult  Urethritis is swelling (inflammation) of the urethra. The urethra is the tube that drains urine from the bladder. It is important to get treatment for this condition early. Delayed treatmentmay lead to complications. What are the causes? This condition may be caused by: Germs that are spread through sexual contact. This is the leading cause of urethritis. This may include bacterial or viral infections. Injury to the urethra. Injury can happen after a thin, flexible tube (catheter) is inserted into the urethra to drain urine, or after medical instruments or foreign bodies are inserted into the area. Chemical irritation. This may include contact with spermicide. A disease that causes inflammation. This is rare. What increases the risk? The following factors may make you more likely to develop this condition: Having sex without using a condom. Having multiple sexual partners. Having poor hygiene. What are the signs or symptoms? Symptoms of this condition include: Pain with urination. Frequent urination. Urgent need to urinate. Itching and pain in the vagina or penis. Discharge coming from the penis. However, women rarely have symptoms. How is this diagnosed? This condition is diagnosed based on your medical history and symptoms as well as a physical exam. Tests may also be done. These may include: Urine tests. Swabs from the urethra. How is this treated? Treatment for this condition depends on the cause. Urethritis caused by a bacterial infection is treated with antibiotic medicine. Any sexual partnersmust also be treated. Follow these instructions at home: Medicines Take over-the-counter and prescription medicines only as told by your health care provider. If you were prescribed antibiotic medicine, take it as told by your health care provider. Do not stop taking the antibiotic even if you start to feel better. Lifestyle Avoid using perfumed soaps, bubble bath, and  shampoo when you bathe or shower. Rinse the vaginal area after bathing. Wear cotton underwear. Not wearing underwear when going to bed can help. Make sure to wipe from front to back after using the toilet if you are female. Do not have sex until your health care provider approves. When you do have sex, be sure to practice safe sex. Tell anyone with whom you have had sexual relations in the past 60 days that he or she may be at risk of infection. General instructions Drink enough fluid to keep your urine clear or pale yellow. It is up to you to get your test results. Ask your health care provider, or the department that is doing the test, when your results will be ready. Keep all follow-up visits as told by your health care provider. This is important. Get tested again 3 months after treatment to make sure the infection is gone. It is important that your sexual partner also gets tested again. Contact a health care provider if: Your symptoms have not improved after 3 days. Your symptoms get worse. You have eye redness or pain. You develop abdominal pain or pelvic pain (in females). You develop joint pain. You have a fever. Get help right away if: You have severe pain in the belly, back, or side. You vomit repeatedly. Summary Urethritis is a swelling (inflammation) of the urethra. This condition is caused by germs that are spread through sexual contact. This is the main cause of this illness. It is important to get treatment for this condition early. Delayed treatment may lead to complications. Treatment for this condition depends on the cause. Any sexual partners must also be treated. This information is not intended to replace advice given to you by   treatment for this condition early. Delayed treatment may lead to complications.  Treatment for this condition depends on the cause. Any sexual partners must also be treated. This information is not intended to replace advice given to you by your health care provider. Make sure you discuss any questions you have with your health care provider. Document Revised: 01/15/2017 Document Reviewed:  03/10/2016 Elsevier Patient Education  2020 [employer].

## 2019-12-13 NOTE — Telephone Encounter (Signed)
I send one month refill but she needs a follow up before additional can be send as she has not been seen in over 6 months.

## 2019-12-15 LAB — CERVICOVAGINAL ANCILLARY ONLY
Chlamydia: NEGATIVE
Comment: NEGATIVE
Comment: NEGATIVE
Comment: NORMAL
Neisseria Gonorrhea: NEGATIVE
Trichomonas: NEGATIVE

## 2019-12-16 LAB — URINE CULTURE: Organism ID, Bacteria: NO GROWTH

## 2019-12-20 ENCOUNTER — Ambulatory Visit: Payer: Self-pay | Admitting: Obstetrics & Gynecology

## 2019-12-20 ENCOUNTER — Other Ambulatory Visit: Payer: Self-pay

## 2019-12-20 ENCOUNTER — Encounter (HOSPITAL_COMMUNITY): Payer: Self-pay | Admitting: Psychiatry

## 2019-12-20 ENCOUNTER — Telehealth (INDEPENDENT_AMBULATORY_CARE_PROVIDER_SITE_OTHER): Payer: No Payment, Other | Admitting: Psychiatry

## 2019-12-20 DIAGNOSIS — F319 Bipolar disorder, unspecified: Secondary | ICD-10-CM

## 2019-12-20 DIAGNOSIS — F411 Generalized anxiety disorder: Secondary | ICD-10-CM

## 2019-12-20 DIAGNOSIS — F332 Major depressive disorder, recurrent severe without psychotic features: Secondary | ICD-10-CM | POA: Insufficient documentation

## 2019-12-20 DIAGNOSIS — F4310 Post-traumatic stress disorder, unspecified: Secondary | ICD-10-CM

## 2019-12-20 DIAGNOSIS — F900 Attention-deficit hyperactivity disorder, predominantly inattentive type: Secondary | ICD-10-CM | POA: Diagnosis not present

## 2019-12-20 DIAGNOSIS — F1210 Cannabis abuse, uncomplicated: Secondary | ICD-10-CM | POA: Diagnosis not present

## 2019-12-20 MED ORDER — LAMOTRIGINE 200 MG PO TABS: 200.0000 mg | ORAL_TABLET | Freq: Every day | ORAL | 2 refills | Status: DC

## 2019-12-20 MED ORDER — AMPHETAMINE-DEXTROAMPHET ER 20 MG PO CP24: 20.0000 mg | ORAL_CAPSULE | Freq: Two times a day (BID) | ORAL | 0 refills | Status: DC

## 2019-12-20 MED ORDER — HYDROXYZINE HCL 10 MG PO TABS: 10.0000 mg | ORAL_TABLET | Freq: Three times a day (TID) | ORAL | 2 refills | Status: DC | PRN

## 2019-12-20 MED ORDER — GABAPENTIN 300 MG PO CAPS: 300.0000 mg | ORAL_CAPSULE | Freq: Three times a day (TID) | ORAL | 2 refills | Status: DC

## 2019-12-20 MED ORDER — UNISOM SLEEPMELTS 25 MG PO TBDP: 25.0000 mg | ORAL_TABLET | Freq: Every evening | ORAL | 2 refills | Status: DC

## 2019-12-20 MED ORDER — VENLAFAXINE HCL ER 75 MG PO CP24: 75.0000 mg | ORAL_CAPSULE | Freq: Every day | ORAL | 2 refills | Status: DC
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
## 2019-12-21 ENCOUNTER — Telehealth (HOSPITAL_COMMUNITY): Payer: Self-pay | Admitting: *Deleted

## 2019-12-21 ENCOUNTER — Other Ambulatory Visit (HOSPITAL_COMMUNITY): Payer: Self-pay | Admitting: Psychiatry

## 2019-12-21 MED ORDER — AMPHETAMINE-DEXTROAMPHETAMINE 20 MG PO TABS: 20.0000 mg | ORAL_TABLET | Freq: Two times a day (BID) | ORAL | 0 refills | Status: DC

## 2019-12-21 NOTE — Telephone Encounter (Signed)
Entry corrected and medication sent to preferred pharmacy. Patient instructed to return prior order of Adderall XR 20 BID in to the pharmacy. Pharmacy also notified and told to take prior order before releasing new script. Patient endorsed understanding and agreed.

## 2019-12-21 NOTE — Telephone Encounter (Signed)
VM on writers phone from patient stating she went to pick her meds up and one of them was XR and she realized she didn't take XR and wants to speak with Ms Ronne Binning NP re it. Called back to clarify which med, she has XR adderall and effexor, left her a message to call back. Will notify Brittney of her concern.

## 2020-01-09 ENCOUNTER — Other Ambulatory Visit: Payer: Self-pay

## 2020-01-09 DIAGNOSIS — N946 Dysmenorrhea, unspecified: Secondary | ICD-10-CM

## 2020-01-09 MED ORDER — LEVONORGEST-ETH ESTRAD 91-DAY 0.15-0.03 &0.01 MG PO TABS: 1.0000 | ORAL_TABLET | Freq: Every day | ORAL | 4 refills | Status: DC

## 2020-01-09 NOTE — Progress Notes (Signed)
Patient called and need refill on birth control. Patient also scheduled for annual exam as she is due. Kathrene Alu RN

## 2020-01-19 ENCOUNTER — Telehealth (HOSPITAL_COMMUNITY): Payer: Self-pay | Admitting: *Deleted

## 2020-01-19 NOTE — Telephone Encounter (Signed)
Call from patient stating she went to fill her Adderall rx and it was a rx for extended release and she needs immediate release. Will forward the concern to the provider.

## 2020-01-22 ENCOUNTER — Other Ambulatory Visit (HOSPITAL_COMMUNITY): Payer: Self-pay | Admitting: Psychiatry

## 2020-01-22 MED ORDER — AMPHETAMINE-DEXTROAMPHETAMINE 20 MG PO TABS: 20.0000 mg | ORAL_TABLET | Freq: Two times a day (BID) | ORAL | 0 refills | Status: DC

## 2020-01-22 NOTE — Telephone Encounter (Signed)
Medication refilled and sent to preferred pharmacy. Provider placed a note to the pharmacy instructing them to fill immediate release.

## 2020-02-21 ENCOUNTER — Telehealth (HOSPITAL_COMMUNITY): Payer: Self-pay | Admitting: *Deleted

## 2020-02-21 ENCOUNTER — Other Ambulatory Visit (HOSPITAL_COMMUNITY): Payer: Self-pay | Admitting: Psychiatry

## 2020-02-21 DIAGNOSIS — F900 Attention-deficit hyperactivity disorder, predominantly inattentive type: Secondary | ICD-10-CM

## 2020-02-21 MED ORDER — AMPHETAMINE-DEXTROAMPHETAMINE 20 MG PO TABS: 20.0000 mg | ORAL_TABLET | Freq: Two times a day (BID) | ORAL | 0 refills | Status: DC

## 2020-02-21 NOTE — Telephone Encounter (Signed)
VM left for writer re her Adderall Rx being called into her preferred pharmacy but its for the extended release and she takes the immediate release. Will ask Toy Cookey NP to review order and make any needed changes.

## 2020-02-21 NOTE — Telephone Encounter (Signed)
Medication sent to preferred pharmacy. Writer instructed the pharmacy to fill immediate release. Please have patient check her bottle prior to leaving to pharmacy.

## 2020-03-11 ENCOUNTER — Ambulatory Visit: Payer: Self-pay | Admitting: Obstetrics & Gynecology

## 2020-03-21 ENCOUNTER — Other Ambulatory Visit: Payer: Self-pay

## 2020-03-21 ENCOUNTER — Telehealth (INDEPENDENT_AMBULATORY_CARE_PROVIDER_SITE_OTHER): Payer: No Payment, Other | Admitting: Psychiatry

## 2020-03-21 ENCOUNTER — Encounter (HOSPITAL_COMMUNITY): Payer: Self-pay | Admitting: Psychiatry

## 2020-03-21 DIAGNOSIS — F319 Bipolar disorder, unspecified: Secondary | ICD-10-CM

## 2020-03-21 DIAGNOSIS — B009 Herpesviral infection, unspecified: Secondary | ICD-10-CM

## 2020-03-21 DIAGNOSIS — F332 Major depressive disorder, recurrent severe without psychotic features: Secondary | ICD-10-CM

## 2020-03-21 DIAGNOSIS — F900 Attention-deficit hyperactivity disorder, predominantly inattentive type: Secondary | ICD-10-CM

## 2020-03-21 DIAGNOSIS — F411 Generalized anxiety disorder: Secondary | ICD-10-CM | POA: Diagnosis not present

## 2020-03-21 MED ORDER — AMPHETAMINE-DEXTROAMPHETAMINE 20 MG PO TABS: 20.0000 mg | ORAL_TABLET | Freq: Two times a day (BID) | ORAL | 0 refills | Status: DC

## 2020-03-21 MED ORDER — GABAPENTIN 300 MG PO CAPS: 300.0000 mg | ORAL_CAPSULE | Freq: Three times a day (TID) | ORAL | 2 refills | Status: DC

## 2020-03-21 MED ORDER — LAMOTRIGINE 200 MG PO TABS: 200.0000 mg | ORAL_TABLET | Freq: Every day | ORAL | 2 refills | Status: DC

## 2020-03-21 MED ORDER — VENLAFAXINE HCL ER 75 MG PO CP24: 75.0000 mg | ORAL_CAPSULE | Freq: Every day | ORAL | 2 refills | Status: DC

## 2020-03-21 MED ORDER — VALACYCLOVIR HCL 1 G PO TABS: 1000.0000 mg | ORAL_TABLET | Freq: Every day | ORAL | 4 refills | Status: DC

## 2020-03-21 MED ORDER — RISPERIDONE 2 MG PO TABS: 2.0000 mg | ORAL_TABLET | Freq: Every day | ORAL | 2 refills | Status: DC

## 2020-03-21 NOTE — Progress Notes (Signed)
Pt called requesting a refill of Valtrex. Valtrex 1000 mg1 tab PO  daily was sent to her pharmacy.  chiquita l wilson, CMA

## 2020-03-21 NOTE — Progress Notes (Addendum)
BH MD/PA/NP OP Progress Note Virtual Visit via Video Note  I connected with Kathryn [name] on 03/26/20 at  3:00 PM EST by a video enabled telemedicine application and verified that I am speaking with the correct person using two identifiers.  Location: Patient: Home Provider: Clinic   I discussed the limitations of evaluation and management by telemedicine and the availability of in person appointments. The patient expressed understanding and agreed to proceed.  I provided 30 minutes of non-face-to-face time during this encounter.    03/21/2020 3:28 PM Kathryn [name]  MRN:  [id number]  Chief Complaint: "My mental health are better but I still get really get irritable"  HPI: 25 year old female seen today for follow up psychiatric evaluation. She has a psychiatric history of bipolar I disorder, ADHD, anxiety, and insomnia. She is currently managed on adderall 20 mg  daily, gabapentin 300 mg three times daily, lamictal 200 mg daily, effexor 75 mg daily, Unisom 25 mg as needed nightly, and hydroxyzine 10 mg three times daily as needed. Today patient reports that medications are somewhat effective in managing her psychiatric conditions. She notes that she discontinued Unisom and hydroxyzine because they were not effective.   Today she is well groomed, pleasant, cooperative, and engaged in conversation with good eye contact. Patient notes that her anxiety and depression has improved since her last visit. 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 Today provider conducted a PHQ-9 and patient scored 18, at her last visit she scored a 24. A GAD-7 was also conducted and patient scored 14, at her last visit she scored a 20.  Patient notes that she continues have problems sleeping.  She notes that she tosses and turns all night and sleeps approximately 3 to 4 hours.  He also informed provider that her mood continues to fluctuate, she is irritable, and  distractible.  She denies SI/HI/AVH/paranoia.   Patient notes that her concentration has improved since being on Adderall.  She however notes that she would like to take her medications 3 times a day to help maintain her focus while she is at work. She is agreeable to taking Adderall 20 mg in the daytime.  She will cut her afternoon Adderall dose in half that so she is able to take 10 mg in the afternoon, and 10 mg in the evening.  Patient notes that she tried Seroquel in the past to help manage her mood and sleep.  She notes it was effective in glucose however it made her feel paralyzed and she notes that it was discontinued.  She is also agreeable to starting Risperdal 2 mg nightly to help stabilize mood and help manage sleep. Potential side effects of medication and risks vs benefits of treatment vs non-treatment were explained and discussed. All questions were answered. Patient notes that she discontinued hydroxyzine and Unisom She will continue all other medications as prescribed.    Visit Diagnosis:    ICD-10-CM   1. GAD (generalized anxiety disorder)  F41.1 venlafaxine XR (EFFEXOR-XR) 75 MG 24 hr capsule    gabapentin (NEURONTIN) 300 MG capsule  2. Bipolar 1 disorder, depressed (HCC)  F31.9 risperiDONE (RISPERDAL) 2 MG tablet    lamoTRIgine (LAMICTAL) 200 MG tablet    venlafaxine XR (EFFEXOR-XR) 75 MG 24 hr capsule    gabapentin (NEURONTIN) 300 MG capsule  3. Severe episode of recurrent major depressive disorder, without psychotic features (HCC)  F33.2 venlafaxine XR (EFFEXOR-XR) 75 MG 24  hr capsule  4. Attention deficit hyperactivity disorder (ADHD), predominantly inattentive type  F90.0 amphetamine-dextroamphetamine (ADDERALL) 20 MG tablet    Past Psychiatric History:  bipolar I disorder, ADHD, anxiety, and insomnia.  Past Medical History:  Past Medical History:  Diagnosis Date  . Abnormal liver function tests 02/02/2016  . ADHD 06/10/2016  . Alcohol use 02/23/2016  . Allergy   .  Anxiety   . Arm fracture, right    as child, fx in three places, no surgery  . Depression   . Depression with anxiety 2014/04/24   PTSD s/p death of best friend   . Essential hypertension 02/02/2016  . GERD (gastroesophageal reflux disease)   . 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 Headache 04/09/2016  . Hyperlipidemia, mixed 02/02/2016  . Migraine   . Ovarian cyst   . PCOS (polycystic ovarian syndrome)   . Preventative health care 10/27/2015  . Thrombocytosis 04/09/2016  . Vaginal Pap smear, abnormal    HGSIL    Past Surgical History:  Procedure Laterality Date  . ADENOIDECTOMY  2002  . TYMPANOSTOMY TUBE PLACEMENT Bilateral 1999    Family Psychiatric History:  Maternal aunt anxiety, Maternal grandfather anxiety, Mother anxiety and OCD  Family History:  Family History  Problem Relation Age of Onset  . Hypertension Mother   . Heart disease Mother        CAD, stent at 64, s/p cardiac arrest with Defib  . Hyperlipidemia Mother   . Diabetes Mother        s/p gastric bypass  . Obesity Mother        s/p gastric bypass  . OCD Mother   . Hyperlipidemia Father   . Hypertension Father   . Anxiety disorder Father   . Asthma Sister   . Hyperlipidemia Brother   . Anxiety disorder Brother   . Stroke Maternal Grandmother   . Thyroid disease Maternal Grandmother   . Anxiety disorder Maternal Grandmother   . Heart disease Maternal Grandfather        MI first at 13, s/p stents and CABG  . Hyperlipidemia Maternal Grandfather   . Hypertension Maternal Grandfather   . Stroke Maternal Grandfather   . Cancer Maternal Grandfather        melanoma  . Aneurysm Maternal Grandfather        brain  . Kidney disease Maternal Grandfather        tumor  . Heart disease Paternal Grandmother   . Cancer Paternal [place] and brain  . Heart disease Maternal Uncle   . Vision loss Maternal Uncle   . Anxiety disorder Maternal Uncle   . Anxiety disorder Maternal Aunt   . Depression Maternal  Aunt     Social History:  Social History   Socioeconomic History  . Marital status: Single    Spouse name: Not on file  . Number of children: 0  . Years of education: 29  . Highest education level: Not on file  Occupational History    Comment: [employer]  Tobacco Use  . Smoking status: Heavy Tobacco Smoker    Packs/day: 1.50    Types: Cigarettes  . Smokeless tobacco: Current User    Types: Snuff  Vaping Use  . Vaping Use: Never used  Substance and Sexual Activity  . Alcohol use: No    Alcohol/week: 0.0 standard drinks    Comment: maybe every 6 months  . Drug use: Yes    Types: Marijuana  Comment: daily  . Sexual activity: Yes    Birth control/protection: Pill  Other Topics Concern  . Not on file  Social History Narrative  . Not on file   Social Determinants of Health   Financial Resource Strain: Not on file  Food Insecurity: Not on file  Transportation Needs: Not on file  Physical Activity: Not on file  Stress: Not on file  Social Connections: Not on file    Allergies:  Allergies  Allergen Reactions  . Oxycodone Nausea Only    Oxycontin also  . Seroquel [Quetiapine Fumarate]     Mental status changes    Metabolic Disorder Labs: Lab Results  Component Value Date   HGBA1C 5.2 11/17/2018   No results found for: PROLACTIN Lab Results  Component Value Date   CHOL 223 (H) 11/17/2018   TRIG 55 11/17/2018   HDL 48 11/17/2018   CHOLHDL 4.6 (H) 11/17/2018   VLDL 21.4 05/05/2017   LDLCALC 166 (H) 11/17/2018   LDLCALC 155 (H) 05/05/2017   Lab Results  Component Value Date   TSH 0.735 11/17/2018   TSH 2.49 05/05/2017    Therapeutic Level Labs: No results found for: LITHIUM No results found for: VALPROATE No components found for:  CBMZ  Current Medications: Current Outpatient Medications  Medication Sig Dispense Refill  . risperiDONE (RISPERDAL) 2 MG tablet Take 1 tablet (2 mg total) by mouth at bedtime. 60 tablet 2  . albuterol (PROVENTIL  HFA;VENTOLIN HFA) 108 (90 Base) MCG/ACT inhaler Inhale 2 puffs into the lungs every 6 (six) hours as needed for wheezing or shortness of breath. 3 Inhaler 0  . amphetamine-dextroamphetamine (ADDERALL) 20 MG tablet Take 1 tablet (20 mg total) by mouth 2 (two) times daily. 60 tablet 0  . cyclobenzaprine (FLEXERIL) 10 MG tablet Take 1 tablet (10 mg total) by mouth 2 (two) times daily as needed for muscle spasms. 10 tablet 0  . diclofenac (CATAFLAM) 50 MG tablet Take 1 tablet (50 mg total) by mouth 3 (three) times daily as needed. 30 tablet 2  . doxycycline (VIBRAMYCIN) 100 MG capsule Take 1 capsule (100 mg total) by mouth 2 (two) times daily. 14 capsule 0  . famotidine (PEPCID) 20 MG tablet Take 1 tablet (20 mg total) by mouth 2 (two) times daily. (Patient not taking: Reported on 12/26/2018) 180 tablet 0  . gabapentin (NEURONTIN) 300 MG capsule Take 1 capsule (300 mg total) by mouth 3 (three) times daily. 90 capsule 2  . lamoTRIgine (LAMICTAL) 200 MG tablet Take 1 tablet (200 mg total) by mouth at bedtime. Take one a day 30 tablet 2  . Levonorgestrel-Ethinyl Estradiol (AMETHIA) 0.15-0.03 &0.01 MG tablet Take 1 tablet by mouth daily. 91 tablet 4  . mometasone-formoterol (DULERA) 100-5 MCG/ACT AERO Inhale 2 puffs into the lungs 2 (two) times daily. 3 Inhaler 1  . montelukast (SINGULAIR) 10 MG tablet Take 1 tablet (10 mg total) by mouth at bedtime. 90 tablet 0  . omeprazole (PRILOSEC) 40 MG capsule Take 1 capsule (40 mg total) by mouth 2 (two) times daily. 180 capsule 3  . ondansetron (ZOFRAN) 4 MG tablet Take 1 tablet (4 mg total) by mouth every 8 (eight) hours as needed for nausea or vomiting. 20 tablet 0  . valACYclovir (VALTREX) 1000 MG tablet Take 1 tablet (1,000 mg total) by mouth daily. 90 tablet 4  . venlafaxine XR (EFFEXOR-XR) 75 MG 24 hr capsule Take 1 capsule (75 mg total) by mouth daily with breakfast. 30 capsule 2  No current facility-administered medications for this visit.      Musculoskeletal: Strength & Muscle Tone: Unable to assess due to telehealth visit [place]: Unable to assess due to telehealth visit Patient leans: N/A  Psychiatric Specialty Exam: Review of Systems  There were no vitals taken for this visit.There is no height or weight on file to calculate BMI.  General Appearance: Well Groomed  Eye Contact:  Good  Speech:  Clear and Coherent and Normal Rate  Volume:  Normal  Mood:  Anxious and Depressed  Affect:  Appropriate and Congruent  Thought Process:  Coherent, Goal Directed and Linear  Orientation:  Full (Time, Place, and Person)  Thought Content: WDL and Logical   Suicidal Thoughts:  No  Homicidal Thoughts:  No  Memory:  Immediate;   Good Recent;   Good Remote;   Good  Judgement:  Good  Insight:  Good  Psychomotor Activity:  Normal  Concentration:  Concentration: Fair and Attention Span: Fair  Recall:  Good  Fund of Knowledge: Good  Language: Good  Akathisia:  No  Handed:  Right  AIMS (if indicated): Not done  Assets:  Communication Skills Desire for Improvement Financial Resources/Insurance Housing Social Support  ADL's:  Intact  Cognition: WNL  Sleep:  Fair   Screenings: GAD-7   Flowsheet Row Video Visit from 03/21/2020 in [facility] Video Visit from 12/20/2019 in [facility]  Total GAD-7 Score 14 20    PHQ2-9   Flowsheet Row Video Visit from 03/21/2020 in [facility] Video Visit from 12/20/2019 in [facility] Office Visit from 08/22/2015 in [facility]  PHQ-2 Total Score 4 6 5   PHQ-9 Total Score 18 24 19        Assessment and Plan: Patient endorses symptoms of anxiety, depression, hypomania, and insomnia.  She is agreeable to starting Risperdal 2 mg nightly to help stabilize mood and help manage sleep.  She notes that she would like to take Adderall 3  times a day.  She is agreeable to taking Adderall 20 mg in the daytime.  She will cut her afternoon Adderall dose in half that so she is able to take 10 mg in the afternoon, and 10 mg in the evening.  She will continue all other medications as prescribed.  1. GAD (generalized anxiety disorder)  Continue- venlafaxine XR (EFFEXOR-XR) 75 MG 24 hr capsule; Take 1 capsule (75 mg total) by mouth daily with breakfast.  Dispense: 30 capsule; Refill: 2 Continue- gabapentin (NEURONTIN) 300 MG capsule; Take 1 capsule (300 mg total) by mouth 3 (three) times daily.  Dispense: 90 capsule; Refill: 2  2. Bipolar 1 disorder, depressed ([place])  Start- risperiDONE (RISPERDAL) 2 MG tablet; Take 1 tablet (2 mg total) by mouth at bedtime.  Dispense: 60 tablet; Refill: 2 Continue- lamoTRIgine (LAMICTAL) 200 MG tablet; Take 1 tablet (200 mg total) by mouth at bedtime. Take one a day  Dispense: 30 tablet; Refill: 2 Continue- venlafaxine XR (EFFEXOR-XR) 75 MG 24 hr capsule; Take 1 capsule (75 mg total) by mouth daily with breakfast.  Dispense: 30 capsule; Refill: 2 Continue- gabapentin (NEURONTIN) 300 MG capsule; Take 1 capsule (300 mg total) by mouth 3 (three) times daily.  Dispense: 90 capsule; Refill: 2  3. Severe episode of recurrent major depressive disorder, without psychotic features ([place])  Continue- venlafaxine XR (EFFEXOR-XR) 75 MG 24 hr capsule; Take 1 capsule (75 mg total)  by mouth daily with breakfast.  Dispense: 30 capsule; Refill: 2  4. Attention deficit hyperactivity disorder (ADHD), predominantly inattentive type  Continue- amphetamine-dextroamphetamine (ADDERALL) 20 MG tablet; Take 1 tablet (20 mg total) by mouth 2 (two) times daily.  Dispense: 60 tablet; Refill: 0  Follow up in 3 months  Salley Slaughter, NP 03/21/2020, 3:28 PM

## 2020-04-19 ENCOUNTER — Telehealth (HOSPITAL_COMMUNITY): Payer: Self-pay | Admitting: *Deleted

## 2020-04-19 ENCOUNTER — Other Ambulatory Visit (HOSPITAL_COMMUNITY): Payer: Self-pay | Admitting: Psychiatry

## 2020-04-19 DIAGNOSIS — F900 Attention-deficit hyperactivity disorder, predominantly inattentive type: Secondary | ICD-10-CM

## 2020-04-19 MED ORDER — AMPHETAMINE-DEXTROAMPHETAMINE 20 MG PO TABS: 20.0000 mg | ORAL_TABLET | Freq: Two times a day (BID) | ORAL | 0 refills | Status: DC

## 2020-04-19 NOTE — Telephone Encounter (Signed)
Medication refilled and sent to preferred pharmacy

## 2020-04-19 NOTE — Telephone Encounter (Signed)
Call from patient requesting Rx for her Adderall. Reviewed record and she should be out today. Will ask Eulis Canner DNP for rx to be called to her preferred pharmacy.

## 2020-05-22 ENCOUNTER — Telehealth (HOSPITAL_COMMUNITY): Payer: Self-pay

## 2020-05-22 ENCOUNTER — Other Ambulatory Visit (HOSPITAL_COMMUNITY): Payer: Self-pay | Admitting: Psychiatry

## 2020-05-22 DIAGNOSIS — F900 Attention-deficit hyperactivity disorder, predominantly inattentive type: Secondary | ICD-10-CM

## 2020-05-22 MED ORDER — AMPHETAMINE-DEXTROAMPHETAMINE 20 MG PO TABS: 20.0000 mg | ORAL_TABLET | Freq: Two times a day (BID) | ORAL | 0 refills | Status: DC

## 2020-05-22 NOTE — Telephone Encounter (Signed)
Medication sent to preferred pharmacy

## 2020-05-22 NOTE — Telephone Encounter (Signed)
Patient called requesting a refill on her Adderall 20mg  to be sent to [facility] [address]. Followup scheduled for 5/4. Please review and advise. Thank you

## 2020-05-24 NOTE — Telephone Encounter (Signed)
Notified patient - pt didn't pick up so LVM

## 2020-06-19 ENCOUNTER — Telehealth (INDEPENDENT_AMBULATORY_CARE_PROVIDER_SITE_OTHER): Payer: No Payment, Other | Admitting: Psychiatry

## 2020-06-19 ENCOUNTER — Other Ambulatory Visit: Payer: Self-pay

## 2020-06-19 ENCOUNTER — Encounter (HOSPITAL_COMMUNITY): Payer: Self-pay | Admitting: Psychiatry

## 2020-06-19 DIAGNOSIS — F900 Attention-deficit hyperactivity disorder, predominantly inattentive type: Secondary | ICD-10-CM | POA: Diagnosis not present

## 2020-06-19 DIAGNOSIS — F332 Major depressive disorder, recurrent severe without psychotic features: Secondary | ICD-10-CM | POA: Diagnosis not present

## 2020-06-19 DIAGNOSIS — F319 Bipolar disorder, unspecified: Secondary | ICD-10-CM | POA: Diagnosis not present

## 2020-06-19 DIAGNOSIS — F411 Generalized anxiety disorder: Secondary | ICD-10-CM

## 2020-06-19 MED ORDER — GABAPENTIN 300 MG PO CAPS: 300.0000 mg | ORAL_CAPSULE | Freq: Three times a day (TID) | ORAL | 2 refills | Status: DC

## 2020-06-19 MED ORDER — RISPERIDONE 2 MG PO TABS: 2.0000 mg | ORAL_TABLET | Freq: Every day | ORAL | 2 refills | Status: DC

## 2020-06-19 MED ORDER — VENLAFAXINE HCL ER 75 MG PO CP24
75.0000 mg | ORAL_CAPSULE | Freq: Every day | ORAL | 2 refills | Status: DC
Start: 2020-06-19 — End: 2020-09-19

## 2020-06-19 MED ORDER — LAMOTRIGINE 200 MG PO TABS
200.0000 mg | ORAL_TABLET | Freq: Every day | ORAL | 2 refills | Status: DC
Start: 2020-06-19 — End: 2020-09-19

## 2020-06-19 MED ORDER — AMPHETAMINE-DEXTROAMPHETAMINE 20 MG PO TABS: 20.0000 mg | ORAL_TABLET | Freq: Two times a day (BID) | ORAL | 0 refills | Status: DC

## 2020-06-19 NOTE — Progress Notes (Signed)
[facility] MD/PA/NP OP Progress Note Virtual Visit via Telephone Note  I connected with Kathryn [name] on 06/19/20 at  2:30 PM EDT by telephone and verified that I am speaking with the correct person using two identifiers.  Location: Patient: home Provider: Clinic   I discussed the limitations, risks, security and privacy concerns of performing an evaluation and management service by telephone and the availability of in person appointments. I also discussed with the patient that there may be a patient responsible charge related to this service. The patient expressed understanding and agreed to proceed.   I provided 30 minutes of non-face-to-face time during this encounter.      06/19/2020 2:52 PM Kathryn [name]  MRN:  [id number]  Chief Complaint: "My mood is great but I have gained weight"  HPI: 25 year old female seen today for follow up psychiatric evaluation. She has a psychiatric history of bipolar I disorder, ADHD, anxiety, and insomnia. She is currently managed on adderall 20 mg  daily, gabapentin 300 mg three times daily, lamictal 200 mg daily, Risperdal 2 mg nightly, and effexor 75 mg daily. Today patient reports that medications are effective in managing her psychiatric conditions.   Today she was unable to logon virtually so her assessment was done over the phone.  During exam she was pleasant, cooperative, and engaged in conversation.  She informed Probation officer that her mood has been great and notes that this is the best that she has felt since being on medication.  She however notes that she dislikes the weight gain from Risperdal.  She notes that within the last 3 months she has gained 20 to 30 pounds.  She informed Probation officer that her anxiety and depression has also improved.  Provider conducted a GAD-7 and patient scored a 6, at her last visit she scored a 14.  Provider also conducted a PHQ-9 and patient scored a 9, at her last visit she scored an 18.  She endorses adequate sleep and appetite.   Patient notes that since she has gained weight she has joined the gym and has been trying to eat better.  Today she denies SI/HI/AVH, mania, or paranoia.   Patient notes that recently she broke her right ankle.  She notes that she was playing basketball with her children.  She informed her that her pain is being managed with pain medications.  No medication changes made today.  Patient agreeable to continue medications as prescribed. No other concerns at this time..    Visit Diagnosis:    ICD-10-CM   1. GAD (generalized anxiety disorder)  F41.1 venlafaxine XR (EFFEXOR-XR) 75 MG 24 hr capsule    gabapentin (NEURONTIN) 300 MG capsule  2. Bipolar 1 disorder, depressed (HCC)  F31.9 venlafaxine XR (EFFEXOR-XR) 75 MG 24 hr capsule    risperiDONE (RISPERDAL) 2 MG tablet    lamoTRIgine (LAMICTAL) 200 MG tablet    gabapentin (NEURONTIN) 300 MG capsule  3. Severe episode of recurrent major depressive disorder, without psychotic features (HCC)  F33.2 venlafaxine XR (EFFEXOR-XR) 75 MG 24 hr capsule  4. Attention deficit hyperactivity disorder (ADHD), predominantly inattentive type  F90.0 amphetamine-dextroamphetamine (ADDERALL) 20 MG tablet    Past Psychiatric History:  bipolar I disorder, ADHD, anxiety, and insomnia.  Past Medical History:  Past Medical History:  Diagnosis Date  . Abnormal liver function tests 02/02/2016  . ADHD 06/10/2016  . Alcohol use 02/23/2016  . Allergy   . Anxiety   . Arm fracture, right    as child, fx in  three places, no surgery  . Depression   . Depression with anxiety May 10, 2014   PTSD s/p death of best friend   . Essential hypertension 02/02/2016  . GERD (gastroesophageal reflux disease)   . 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 Headache 04/09/2016  . Hyperlipidemia, mixed 02/02/2016  . Migraine   . Ovarian cyst   . PCOS (polycystic ovarian syndrome)   . Preventative health care 10/27/2015  . Thrombocytosis 04/09/2016  . Vaginal Pap smear, abnormal    HGSIL    Past  Surgical History:  Procedure Laterality Date  . ADENOIDECTOMY  2002  . TYMPANOSTOMY TUBE PLACEMENT Bilateral 1999    Family Psychiatric History:  Maternal aunt anxiety, Maternal grandfather anxiety, Mother anxiety and OCD  Family History:  Family History  Problem Relation Age of Onset  . Hypertension Mother   . Heart disease Mother        CAD, stent at 61, s/p cardiac arrest with Defib  . Hyperlipidemia Mother   . Diabetes Mother        s/p gastric bypass  . Obesity Mother        s/p gastric bypass  . OCD Mother   . Hyperlipidemia Father   . Hypertension Father   . Anxiety disorder Father   . Asthma Sister   . Hyperlipidemia Brother   . Anxiety disorder Brother   . Stroke Maternal Grandmother   . Thyroid disease Maternal Grandmother   . Anxiety disorder Maternal Grandmother   . Heart disease Maternal Grandfather        MI first at 77, s/p stents and CABG  . Hyperlipidemia Maternal Grandfather   . Hypertension Maternal Grandfather   . Stroke Maternal Grandfather   . Cancer Maternal Grandfather        melanoma  . Aneurysm Maternal Grandfather        brain  . Kidney disease Maternal Grandfather        tumor  . Heart disease Paternal Grandmother   . Cancer Paternal Lucilla Edin and brain  . Heart disease Maternal Uncle   . Vision loss Maternal Uncle   . Anxiety disorder Maternal Uncle   . Anxiety disorder Maternal Aunt   . Depression Maternal Aunt     Social History:  Social History   Socioeconomic History  . Marital status: Single    Spouse name: Not on file  . Number of children: 0  . Years of education: 57  . Highest education level: Not on file  Occupational History    Comment: [employer]  Tobacco Use  . Smoking status: Heavy Tobacco Smoker    Packs/day: 1.50    Types: Cigarettes  . Smokeless tobacco: Current User    Types: Snuff  Vaping Use  . Vaping Use: Never used  Substance and Sexual Activity  . Alcohol use: No    Alcohol/week:  0.0 standard drinks    Comment: maybe every 6 months  . Drug use: Yes    Types: Marijuana    Comment: daily  . Sexual activity: Yes    Birth control/protection: Pill  Other Topics Concern  . Not on file  Social History Narrative  . Not on file   Social Determinants of Health   Financial Resource Strain: Not on file  Food Insecurity: Not on file  Transportation Needs: Not on file  Physical Activity: Not on file  Stress: Not on file  Social Connections: Not on file    Allergies:  Allergies  Allergen Reactions  . Oxycodone Nausea Only    Oxycontin also  . Seroquel [Quetiapine Fumarate]     Mental status changes    Metabolic Disorder Labs: Lab Results  Component Value Date   HGBA1C 5.2 11/17/2018   No results found for: PROLACTIN Lab Results  Component Value Date   CHOL 223 (H) 11/17/2018   TRIG 55 11/17/2018   HDL 48 11/17/2018   CHOLHDL 4.6 (H) 11/17/2018   VLDL 21.4 05/05/2017   LDLCALC 166 (H) 11/17/2018   LDLCALC 155 (H) 05/05/2017   Lab Results  Component Value Date   TSH 0.735 11/17/2018   TSH 2.49 05/05/2017    Therapeutic Level Labs: No results found for: LITHIUM No results found for: VALPROATE No components found for:  CBMZ  Current Medications: Current Outpatient Medications  Medication Sig Dispense Refill  . albuterol (PROVENTIL HFA;VENTOLIN HFA) 108 (90 Base) MCG/ACT inhaler Inhale 2 puffs into the lungs every 6 (six) hours as needed for wheezing or shortness of breath. 3 Inhaler 0  . amphetamine-dextroamphetamine (ADDERALL) 20 MG tablet Take 1 tablet (20 mg total) by mouth 2 (two) times daily. 60 tablet 0  . cyclobenzaprine (FLEXERIL) 10 MG tablet Take 1 tablet (10 mg total) by mouth 2 (two) times daily as needed for muscle spasms. 10 tablet 0  . diclofenac (CATAFLAM) 50 MG tablet Take 1 tablet (50 mg total) by mouth 3 (three) times daily as needed. 30 tablet 2  . doxycycline (VIBRAMYCIN) 100 MG capsule Take 1 capsule (100 mg total) by mouth  2 (two) times daily. 14 capsule 0  . famotidine (PEPCID) 20 MG tablet Take 1 tablet (20 mg total) by mouth 2 (two) times daily. (Patient not taking: Reported on 12/26/2018) 180 tablet 0  . gabapentin (NEURONTIN) 300 MG capsule Take 1 capsule (300 mg total) by mouth 3 (three) times daily. 90 capsule 2  . lamoTRIgine (LAMICTAL) 200 MG tablet Take 1 tablet (200 mg total) by mouth at bedtime. Take one a day 30 tablet 2  . Levonorgestrel-Ethinyl Estradiol (AMETHIA) 0.15-0.03 &0.01 MG tablet Take 1 tablet by mouth daily. 91 tablet 4  . mometasone-formoterol (DULERA) 100-5 MCG/ACT AERO Inhale 2 puffs into the lungs 2 (two) times daily. 3 Inhaler 1  . montelukast (SINGULAIR) 10 MG tablet Take 1 tablet (10 mg total) by mouth at bedtime. 90 tablet 0  . omeprazole (PRILOSEC) 40 MG capsule Take 1 capsule (40 mg total) by mouth 2 (two) times daily. 180 capsule 3  . ondansetron (ZOFRAN) 4 MG tablet Take 1 tablet (4 mg total) by mouth every 8 (eight) hours as needed for nausea or vomiting. 20 tablet 0  . risperiDONE (RISPERDAL) 2 MG tablet Take 1 tablet (2 mg total) by mouth at bedtime. 60 tablet 2  . valACYclovir (VALTREX) 1000 MG tablet Take 1 tablet (1,000 mg total) by mouth daily. 90 tablet 4  . venlafaxine XR (EFFEXOR-XR) 75 MG 24 hr capsule Take 1 capsule (75 mg total) by mouth daily with breakfast. 30 capsule 2   No current facility-administered medications for this visit.     Musculoskeletal: Strength & Muscle Tone: Unable to assess due to telephone visit Gait & Station: Unable to assess due to telephone visit Patient leans: N/A  Psychiatric Specialty Exam: Review of Systems  There were no vitals taken for this visit.There is no height or weight on file to calculate BMI.  General Appearance: Unable to assess due to telephone visit  Eye Contact:  Unable to assess  due to telephone visit  Speech:  Clear and Coherent and Normal Rate  Volume:  Normal  Mood:  Euthymic  Affect:  Appropriate and  Congruent  Thought Process:  Coherent, Goal Directed and Linear  Orientation:  Full (Time, Place, and Person)  Thought Content: WDL and Logical   Suicidal Thoughts:  No  Homicidal Thoughts:  No  Memory:  Immediate;   Good Recent;   Good Remote;   Good  Judgement:  Good  Insight:  Good  Psychomotor Activity:  Normal  Concentration:  Concentration: Fair and Attention Span: Fair  Recall:  Good  Fund of Knowledge: Good  Language: Good  Akathisia:  No  Handed:  Right  AIMS (if indicated): Not done  Assets:  Communication Skills Desire for Improvement Financial Resources/Insurance Housing Social Support  ADL's:  Intact  Cognition: WNL  Sleep:  Good   Screenings: GAD-7   Flowsheet Row Video Visit from 06/19/2020 in [facility] Video Visit from 03/21/2020 in [facility] Video Visit from 12/20/2019 in [facility]  Total GAD-7 Score 6 14 20     PHQ2-9   Flowsheet Row Video Visit from 06/19/2020 in [facility] Video Visit from 03/21/2020 in [facility] Video Visit from 12/20/2019 in [facility] Office Visit from 08/22/2015 in [place] [facility]  PHQ-2 Total Score 2 4 6 5   PHQ-9 Total Score 9 18 24 19        Assessment and Plan: Patient notes that she is doing well on her current medication regimen however notes that she dislikes her weight gain.  No medication changes made today.  Patient agreeable to continue medication prescription.  1. GAD (generalized anxiety disorder)  Continue- venlafaxine XR (EFFEXOR-XR) 75 MG 24 hr capsule; Take 1 capsule (75 mg total) by mouth daily with breakfast.  Dispense: 30 capsule; Refill: 2 Continue- gabapentin (NEURONTIN) 300 MG capsule; Take 1 capsule (300 mg total) by mouth 3 (three) times daily.  Dispense: 90 capsule; Refill: 2  2. Bipolar 1 disorder,  depressed (HCC)  Continue- risperiDONE (RISPERDAL) 2 MG tablet; Take 1 tablet (2 mg total) by mouth at bedtime.  Dispense: 60 tablet; Refill: 2 Continue- lamoTRIgine (LAMICTAL) 200 MG tablet; Take 1 tablet (200 mg total) by mouth at bedtime. Take one a day  Dispense: 30 tablet; Refill: 2 Continue- venlafaxine XR (EFFEXOR-XR) 75 MG 24 hr capsule; Take 1 capsule (75 mg total) by mouth daily with breakfast.  Dispense: 30 capsule; Refill: 2 Continue- gabapentin (NEURONTIN) 300 MG capsule; Take 1 capsule (300 mg total) by mouth 3 (three) times daily.  Dispense: 90 capsule; Refill: 2  3. Severe episode of recurrent major depressive disorder, without psychotic features ([facility])  Continue- venlafaxine XR (EFFEXOR-XR) 75 MG 24 hr capsule; Take 1 capsule (75 mg total) by mouth daily with breakfast.  Dispense: 30 capsule; Refill: 2  4. Attention deficit hyperactivity disorder (ADHD), predominantly inattentive type  Continue- amphetamine-dextroamphetamine (ADDERALL) 20 MG tablet; Take 1 tablet (20 mg total) by mouth 2 (two) times daily.  Dispense: 60 tablet; Refill: 0  Follow up in 3 months  Salley Slaughter, NP 06/19/2020, 2:52 PM

## 2020-07-19 ENCOUNTER — Other Ambulatory Visit (HOSPITAL_COMMUNITY): Payer: Self-pay | Admitting: Psychiatry

## 2020-07-19 ENCOUNTER — Telehealth (HOSPITAL_COMMUNITY): Payer: Self-pay

## 2020-07-19 DIAGNOSIS — F900 Attention-deficit hyperactivity disorder, predominantly inattentive type: Secondary | ICD-10-CM

## 2020-07-19 MED ORDER — AMPHETAMINE-DEXTROAMPHETAMINE 20 MG PO TABS: 20.0000 mg | ORAL_TABLET | Freq: Two times a day (BID) | ORAL | 0 refills | Status: DC

## 2020-07-19 NOTE — Telephone Encounter (Signed)
Medication refilled and sent to preferred pharmacy

## 2020-07-19 NOTE — Telephone Encounter (Signed)
Patient called requesting a refill on her Adderall 20mg  to be sent to [facility] on [address]. Thank you

## 2020-07-24 NOTE — Telephone Encounter (Signed)
Notified patient.

## 2020-08-22 ENCOUNTER — Other Ambulatory Visit (HOSPITAL_COMMUNITY): Payer: Self-pay | Admitting: Psychiatry

## 2020-08-22 ENCOUNTER — Telehealth (HOSPITAL_COMMUNITY): Payer: Self-pay | Admitting: *Deleted

## 2020-08-22 DIAGNOSIS — F900 Attention-deficit hyperactivity disorder, predominantly inattentive type: Secondary | ICD-10-CM

## 2020-08-22 MED ORDER — AMPHETAMINE-DEXTROAMPHETAMINE 20 MG PO TABS: 20.0000 mg | ORAL_TABLET | Freq: Two times a day (BID) | ORAL | 0 refills | Status: DC

## 2020-08-22 NOTE — Telephone Encounter (Signed)
Medication refilled and sent to her pharmacy.

## 2020-08-22 NOTE — Telephone Encounter (Signed)
Patient called and requested a refill of Adderall.  Her next appt is in August.

## 2020-09-19 ENCOUNTER — Encounter (HOSPITAL_COMMUNITY): Payer: Self-pay | Admitting: Psychiatry

## 2020-09-19 ENCOUNTER — Telehealth (INDEPENDENT_AMBULATORY_CARE_PROVIDER_SITE_OTHER): Payer: No Payment, Other | Admitting: Psychiatry

## 2020-09-19 ENCOUNTER — Other Ambulatory Visit: Payer: Self-pay

## 2020-09-19 DIAGNOSIS — F411 Generalized anxiety disorder: Secondary | ICD-10-CM

## 2020-09-19 DIAGNOSIS — F332 Major depressive disorder, recurrent severe without psychotic features: Secondary | ICD-10-CM | POA: Diagnosis not present

## 2020-09-19 DIAGNOSIS — F319 Bipolar disorder, unspecified: Secondary | ICD-10-CM

## 2020-09-19 DIAGNOSIS — F900 Attention-deficit hyperactivity disorder, predominantly inattentive type: Secondary | ICD-10-CM

## 2020-09-19 MED ORDER — GABAPENTIN 300 MG PO CAPS: 300.0000 mg | ORAL_CAPSULE | Freq: Three times a day (TID) | ORAL | 2 refills | Status: DC

## 2020-09-19 MED ORDER — VENLAFAXINE HCL ER 75 MG PO CP24: 75.0000 mg | ORAL_CAPSULE | Freq: Every day | ORAL | 2 refills | Status: DC

## 2020-09-19 MED ORDER — AMPHETAMINE-DEXTROAMPHETAMINE 20 MG PO TABS: 20.0000 mg | ORAL_TABLET | Freq: Two times a day (BID) | ORAL | 0 refills | Status: DC

## 2020-09-19 MED ORDER — RISPERIDONE 2 MG PO TABS: 2.0000 mg | ORAL_TABLET | Freq: Every day | ORAL | 2 refills | Status: DC

## 2020-09-19 MED ORDER — LAMOTRIGINE 200 MG PO TABS: 200.0000 mg | ORAL_TABLET | Freq: Every day | ORAL | 2 refills | Status: DC

## 2020-09-19 NOTE — Progress Notes (Signed)
[facility] MD/PA/NP OP Progress Note Virtual Visit via Telephone Note  I connected with Kathryn [name] on 09/19/20 at  3:30 PM EDT by telephone and verified that I am speaking with the correct person using two identifiers.  Location: Patient: home Provider: Clinic   I discussed the limitations, risks, security and privacy concerns of performing an evaluation and management service by telephone and the availability of in person appointments. I also discussed with the patient that there may be a patient responsible charge related to this service. The patient expressed understanding and agreed to proceed.   I provided 30 minutes of non-face-to-face time during this encounter.      09/19/2020 9:26 AM Kathryn [name]  MRN:  [id number]  Chief Complaint: "Things have been going well"  HPI: 25 year old female seen today for follow up psychiatric evaluation. She has a psychiatric history of bipolar I disorder, ADHD, anxiety, and insomnia. She is currently managed on adderall 20 mg  twice daily, gabapentin 300 mg three times daily, lamictal 200 mg daily, Risperdal 2 mg nightly, and effexor 75 mg daily. Today patient reports that medications are effective in managing her psychiatric conditions. She notes that she takes gabapentin once or twice a day instead of three times.     Today she was unable to logon virtually so her assessment was done over the phone.  During exam she was pleasant, cooperative, and engaged in conversation.  She informed Probation officer that things have been going well.  She notes that she was considering discontinuing Risperdal.  Provider asked patient why.  She notes that it is a sleep aid and she feels like she does not need it.  Provider informed patient that Risperdal is an antipsychotic that helps manage her mood.  She informed Probation officer that at this time she would like to continue with because she feels mentally stable.  She does note that she dislikes the 30 pounds that she is gained since being on  Risperdal however reports that she is able to cope with it and noted that she has been going to the gym regularly to work out.  Patient informed Probation officer that her anxiety and depression continues to be minimal.  Provider conducted a GAD-7 and patient scored a 6, at her last visit she scored a 6.  Provider also conducted a PHQ-9 and patient scored a 4, at her last visit she scored a 9.  She notes that she sleeps approximately 5 hours nightly and reports that she has a good appetite.  Today she denies SI/HI/AVH, mania, or paranoia.    Patient notes that she is going to physical therapy to help manage her broken ankle.  She notes that she is no longer in pain and is able to walk on it.  No medication changes made today.  Patient agreeable to continue medications as prescribed. No other concerns at this time..      Visit Diagnosis:    ICD-10-CM   1. GAD (generalized anxiety disorder)  F41.1 venlafaxine XR (EFFEXOR-XR) 75 MG 24 hr capsule    gabapentin (NEURONTIN) 300 MG capsule    2. Bipolar 1 disorder, depressed (HCC)  F31.9 venlafaxine XR (EFFEXOR-XR) 75 MG 24 hr capsule    risperiDONE (RISPERDAL) 2 MG tablet    lamoTRIgine (LAMICTAL) 200 MG tablet    gabapentin (NEURONTIN) 300 MG capsule    3. Severe episode of recurrent major depressive disorder, without psychotic features (HCC)  F33.2 venlafaxine XR (EFFEXOR-XR) 75 MG 24 hr capsule  4. Attention deficit hyperactivity disorder (ADHD), predominantly inattentive type  F90.0 amphetamine-dextroamphetamine (ADDERALL) 20 MG tablet      Past Psychiatric History:  bipolar I disorder, ADHD, anxiety, and insomnia.  Past Medical History:  Past Medical History:  Diagnosis Date   Abnormal liver function tests 02/02/2016   ADHD 06/10/2016   Alcohol use 02/23/2016   Allergy    Anxiety    Arm fracture, right    as child, fx in three places, no surgery   Depression    Depression with anxiety 24-Apr-2014   PTSD s/p death of best friend    Essential  hypertension 02/02/2016   GERD (gastroesophageal reflux disease)    H/O cold sores 04/15/2015   Headache 04/09/2016   Hyperlipidemia, mixed 02/02/2016   Migraine    Ovarian cyst    PCOS (polycystic ovarian syndrome)    Preventative health care 10/27/2015   Thrombocytosis 04/09/2016   Vaginal Pap smear, abnormal    HGSIL    Past Surgical History:  Procedure Laterality Date   ADENOIDECTOMY  2002   TYMPANOSTOMY TUBE PLACEMENT Bilateral 1999    Family Psychiatric History:  Maternal aunt anxiety, Maternal grandfather anxiety, Mother anxiety and OCD  Family History:  Family History  Problem Relation Age of Onset   Hypertension Mother    Heart disease Mother        CAD, stent at 46, s/p cardiac arrest with Defib   Hyperlipidemia Mother    Diabetes Mother        s/p gastric bypass   Obesity Mother        s/p gastric bypass   OCD Mother    Hyperlipidemia Father    Hypertension Father    Anxiety disorder Father    Asthma Sister    Hyperlipidemia Brother    Anxiety disorder Brother    Stroke Maternal Grandmother    Thyroid disease Maternal Grandmother    Anxiety disorder Maternal Grandmother    Heart disease Maternal Grandfather        MI first at 37, s/p stents and CABG   Hyperlipidemia Maternal Grandfather    Hypertension Maternal Grandfather    Stroke Maternal Grandfather    Cancer Maternal Grandfather        melanoma   Aneurysm Maternal Grandfather        brain   Kidney disease Maternal Grandfather        tumor   Heart disease Paternal Grandmother    Cancer Paternal Grandfather        ung and brain   Heart disease Maternal Uncle    Vision loss Maternal Uncle    Anxiety disorder Maternal Uncle    Anxiety disorder Maternal Aunt    Depression Maternal Aunt     Social History:  Social History   Socioeconomic History   Marital status: Single    Spouse name: Not on file   Number of children: 0   Years of education: 12   Highest education level: Not on file   Occupational History    Comment: [employer]  Tobacco Use   Smoking status: Heavy Smoker    Packs/day: 1.50    Types: Cigarettes   Smokeless tobacco: Current    Types: Snuff  Vaping Use   Vaping Use: Never used  Substance and Sexual Activity   Alcohol use: No    Alcohol/week: 0.0 standard drinks    Comment: maybe every 6 months   Drug use: Yes    Types: Marijuana    Comment:  daily   Sexual activity: Yes    Birth control/protection: Pill  Other Topics Concern   Not on file  Social History Narrative   Not on file   Social Determinants of Health   Financial Resource Strain: Not on file  Food Insecurity: Not on file  Transportation Needs: Not on file  Physical Activity: Not on file  Stress: Not on file  Social Connections: Not on file    Allergies:  Allergies  Allergen Reactions   Oxycodone Nausea Only    Oxycontin also   Seroquel [Quetiapine Fumarate]     Mental status changes    Metabolic Disorder Labs: Lab Results  Component Value Date   HGBA1C 5.2 11/17/2018   No results found for: PROLACTIN Lab Results  Component Value Date   CHOL 223 (H) 11/17/2018   TRIG 55 11/17/2018   HDL 48 11/17/2018   CHOLHDL 4.6 (H) 11/17/2018   VLDL 21.4 05/05/2017   LDLCALC 166 (H) 11/17/2018   LDLCALC 155 (H) 05/05/2017   Lab Results  Component Value Date   TSH 0.735 11/17/2018   TSH 2.49 05/05/2017    Therapeutic Level Labs: No results found for: LITHIUM No results found for: VALPROATE No components found for:  CBMZ  Current Medications: Current Outpatient Medications  Medication Sig Dispense Refill   albuterol (PROVENTIL HFA;VENTOLIN HFA) 108 (90 Base) MCG/ACT inhaler Inhale 2 puffs into the lungs every 6 (six) hours as needed for wheezing or shortness of breath. 3 Inhaler 0   amphetamine-dextroamphetamine (ADDERALL) 20 MG tablet Take 1 tablet (20 mg total) by mouth 2 (two) times daily. 60 tablet 0   cyclobenzaprine (FLEXERIL) 10 MG tablet Take 1 tablet (10  mg total) by mouth 2 (two) times daily as needed for muscle spasms. 10 tablet 0   diclofenac (CATAFLAM) 50 MG tablet Take 1 tablet (50 mg total) by mouth 3 (three) times daily as needed. 30 tablet 2   doxycycline (VIBRAMYCIN) 100 MG capsule Take 1 capsule (100 mg total) by mouth 2 (two) times daily. 14 capsule 0   famotidine (PEPCID) 20 MG tablet Take 1 tablet (20 mg total) by mouth 2 (two) times daily. (Patient not taking: Reported on 12/26/2018) 180 tablet 0   gabapentin (NEURONTIN) 300 MG capsule Take 1 capsule (300 mg total) by mouth 3 (three) times daily. 90 capsule 2   lamoTRIgine (LAMICTAL) 200 MG tablet Take 1 tablet (200 mg total) by mouth at bedtime. Take one a day 30 tablet 2   Levonorgestrel-Ethinyl Estradiol (AMETHIA) 0.15-0.03 &0.01 MG tablet Take 1 tablet by mouth daily. 91 tablet 4   mometasone-formoterol (DULERA) 100-5 MCG/ACT AERO Inhale 2 puffs into the lungs 2 (two) times daily. 3 Inhaler 1   montelukast (SINGULAIR) 10 MG tablet Take 1 tablet (10 mg total) by mouth at bedtime. 90 tablet 0   omeprazole (PRILOSEC) 40 MG capsule Take 1 capsule (40 mg total) by mouth 2 (two) times daily. 180 capsule 3   ondansetron (ZOFRAN) 4 MG tablet Take 1 tablet (4 mg total) by mouth every 8 (eight) hours as needed for nausea or vomiting. 20 tablet 0   risperiDONE (RISPERDAL) 2 MG tablet Take 1 tablet (2 mg total) by mouth at bedtime. 60 tablet 2   valACYclovir (VALTREX) 1000 MG tablet Take 1 tablet (1,000 mg total) by mouth daily. 90 tablet 4   venlafaxine XR (EFFEXOR-XR) 75 MG 24 hr capsule Take 1 capsule (75 mg total) by mouth daily with breakfast. 30 capsule 2   No  current facility-administered medications for this visit.     Musculoskeletal: Strength & Muscle Tone:  Unable to assess due to telephone visit Gait & Station:  Unable to assess due to telephone visit Patient leans: N/A  Psychiatric Specialty Exam: Review of Systems  There were no vitals taken for this visit.There is no  height or weight on file to calculate BMI.  General Appearance:  Unable to assess due to telephone visit  Eye Contact:   Unable to assess due to telephone visit  Speech:  Clear and Coherent and Normal Rate  Volume:  Normal  Mood:  Euthymic  Affect:  Appropriate and Congruent  Thought Process:  Coherent, Goal Directed and Linear  Orientation:  Full (Time, Place, and Person)  Thought Content: WDL and Logical   Suicidal Thoughts:  No  Homicidal Thoughts:  No  Memory:  Immediate;   Good Recent;   Good Remote;   Good  Judgement:  Good  Insight:  Good  Psychomotor Activity:  Normal  Concentration:  Concentration: Fair and Attention Span: Fair  Recall:  Good  Fund of Knowledge: Good  Language: Good  Akathisia:  No  Handed:  Right  AIMS (if indicated): Not done  Assets:  Communication Skills Desire for Improvement Financial Resources/Insurance Housing Social Support  ADL's:  Intact  Cognition: WNL  Sleep:  Good   Screenings: GAD-7    Flowsheet Row Video Visit from 09/19/2020 in [facility] Video Visit from 06/19/2020 in [facility] Video Visit from 03/21/2020 in [facility] Video Visit from 12/20/2019 in [facility]  Total GAD-7 Score '6 6 14 20      '$ PHQ2-9    Flowsheet Row Video Visit from 09/19/2020 in [facility] Video Visit from 06/19/2020 in [facility] Video Visit from 03/21/2020 in [facility] Video Visit from 12/20/2019 in [facility] Office Visit from 08/22/2015 in [facility] [place]  PHQ-2 Total Score '1 2 4 6 5  '$ PHQ-9 Total Score '4 9 18 24 19        '$ Assessment and Plan: Patient notes that she is doing well on her current medication regimen however notes that she dislikes her weight gain.  No medication changes made  today.  Patient agreeable to continue medication prescription.  1. GAD (generalized anxiety disorder)  Continue- venlafaxine XR (EFFEXOR-XR) 75 MG 24 hr capsule; Take 1 capsule (75 mg total) by mouth daily with breakfast.  Dispense: 30 capsule; Refill: 2 Continue- gabapentin (NEURONTIN) 300 MG capsule; Take 1 capsule (300 mg total) by mouth 3 (three) times daily.  Dispense: 90 capsule; Refill: 2  2. Bipolar 1 disorder, depressed (HCC)  Continue- risperiDONE (RISPERDAL) 2 MG tablet; Take 1 tablet (2 mg total) by mouth at bedtime.  Dispense: 60 tablet; Refill: 2 Continue- lamoTRIgine (LAMICTAL) 200 MG tablet; Take 1 tablet (200 mg total) by mouth at bedtime. Take one a day  Dispense: 30 tablet; Refill: 2 Continue- venlafaxine XR (EFFEXOR-XR) 75 MG 24 hr capsule; Take 1 capsule (75 mg total) by mouth daily with breakfast.  Dispense: 30 capsule; Refill: 2 Continue- gabapentin (NEURONTIN) 300 MG capsule; Take 1 capsule (300 mg total) by mouth 3 (three) times daily.  Dispense: 90 capsule; Refill: 2  3. Severe episode of recurrent major depressive disorder, without psychotic features ([place])  Continue- venlafaxine XR (EFFEXOR-XR) 75 MG 24 hr capsule;  Take 1 capsule (75 mg total) by mouth daily with breakfast.  Dispense: 30 capsule; Refill: 2  4. Attention deficit hyperactivity disorder (ADHD), predominantly inattentive type  Continue- amphetamine-dextroamphetamine (ADDERALL) 20 MG tablet; Take 1 tablet (20 mg total) by mouth 2 (two) times daily.  Dispense: 60 tablet; Refill: 0  Follow up in 3 months  Salley Slaughter, NP 09/19/2020, 9:26 AM

## 2020-10-22 ENCOUNTER — Telehealth (HOSPITAL_COMMUNITY): Payer: Self-pay | Admitting: *Deleted

## 2020-10-22 ENCOUNTER — Other Ambulatory Visit (HOSPITAL_COMMUNITY): Payer: Self-pay | Admitting: Psychiatry

## 2020-10-22 DIAGNOSIS — F900 Attention-deficit hyperactivity disorder, predominantly inattentive type: Secondary | ICD-10-CM

## 2020-10-22 MED ORDER — AMPHETAMINE-DEXTROAMPHETAMINE 20 MG PO TABS: 20.0000 mg | ORAL_TABLET | Freq: Two times a day (BID) | ORAL | 0 refills | Status: DC

## 2020-10-22 NOTE — Telephone Encounter (Signed)
VM left on writers phone requesting a new rx for her Adderall. She should be out at this time. Her next appt with Dr Ronne Binning is on 12/19/20

## 2020-10-22 NOTE — Telephone Encounter (Signed)
Medication refilled and sent to preferred pharmacy

## 2020-11-18 ENCOUNTER — Other Ambulatory Visit (HOSPITAL_COMMUNITY): Payer: Self-pay | Admitting: Psychiatry

## 2020-11-18 ENCOUNTER — Telehealth (HOSPITAL_COMMUNITY): Payer: Self-pay | Admitting: *Deleted

## 2020-11-18 DIAGNOSIS — F900 Attention-deficit hyperactivity disorder, predominantly inattentive type: Secondary | ICD-10-CM

## 2020-11-18 MED ORDER — AMPHETAMINE-DEXTROAMPHETAMINE 20 MG PO TABS: 20.0000 mg | ORAL_TABLET | Freq: Two times a day (BID) | ORAL | 0 refills | Status: DC

## 2020-11-18 NOTE — Telephone Encounter (Signed)
Patient LVM Requesting Refill  amphetamine-dextroamphetamine (ADDERALL) 20 MG table

## 2020-11-18 NOTE — Telephone Encounter (Signed)
Medication refilled and sent to preferred pharmacy

## 2020-12-18 ENCOUNTER — Other Ambulatory Visit (HOSPITAL_COMMUNITY): Payer: Self-pay | Admitting: Psychiatry

## 2020-12-18 DIAGNOSIS — F319 Bipolar disorder, unspecified: Secondary | ICD-10-CM

## 2020-12-18 DIAGNOSIS — F411 Generalized anxiety disorder: Secondary | ICD-10-CM

## 2020-12-18 DIAGNOSIS — F332 Major depressive disorder, recurrent severe without psychotic features: Secondary | ICD-10-CM

## 2020-12-19 ENCOUNTER — Telehealth (INDEPENDENT_AMBULATORY_CARE_PROVIDER_SITE_OTHER): Payer: No Payment, Other | Admitting: Psychiatry

## 2020-12-19 ENCOUNTER — Encounter (HOSPITAL_COMMUNITY): Payer: Self-pay | Admitting: Psychiatry

## 2020-12-19 DIAGNOSIS — F900 Attention-deficit hyperactivity disorder, predominantly inattentive type: Secondary | ICD-10-CM

## 2020-12-19 DIAGNOSIS — F319 Bipolar disorder, unspecified: Secondary | ICD-10-CM | POA: Diagnosis not present

## 2020-12-19 DIAGNOSIS — F332 Major depressive disorder, recurrent severe without psychotic features: Secondary | ICD-10-CM | POA: Diagnosis not present

## 2020-12-19 DIAGNOSIS — F411 Generalized anxiety disorder: Secondary | ICD-10-CM

## 2020-12-19 MED ORDER — VENLAFAXINE HCL ER 75 MG PO CP24: ORAL_CAPSULE | ORAL | 3 refills | Status: DC

## 2020-12-19 MED ORDER — GABAPENTIN 300 MG PO CAPS: 300.0000 mg | ORAL_CAPSULE | Freq: Three times a day (TID) | ORAL | 3 refills | Status: DC

## 2020-12-19 MED ORDER — AMPHETAMINE-DEXTROAMPHETAMINE 20 MG PO TABS: 20.0000 mg | ORAL_TABLET | Freq: Two times a day (BID) | ORAL | 0 refills | Status: DC

## 2020-12-19 MED ORDER — LAMOTRIGINE 200 MG PO TABS: 200.0000 mg | ORAL_TABLET | Freq: Every day | ORAL | 2 refills | Status: DC

## 2020-12-19 MED ORDER — RISPERIDONE 2 MG PO TABS: 2.0000 mg | ORAL_TABLET | Freq: Every day | ORAL | 3 refills | Status: DC
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
## 2021-01-20 ENCOUNTER — Telehealth (HOSPITAL_COMMUNITY): Payer: Self-pay

## 2021-01-20 ENCOUNTER — Other Ambulatory Visit (HOSPITAL_COMMUNITY): Payer: Self-pay | Admitting: Psychiatry

## 2021-01-20 DIAGNOSIS — F900 Attention-deficit hyperactivity disorder, predominantly inattentive type: Secondary | ICD-10-CM

## 2021-01-20 MED ORDER — AMPHETAMINE-DEXTROAMPHETAMINE 20 MG PO TABS: 20.0000 mg | ORAL_TABLET | Freq: Two times a day (BID) | ORAL | 0 refills | Status: DC

## 2021-01-20 NOTE — Telephone Encounter (Signed)
Patient called requesting a refill on her Adderall 20mg . It was sent to [place] on [address] last time. Followup appt scheduled for 1/26. Please review and advise. Thank you

## 2021-01-20 NOTE — Telephone Encounter (Signed)
Medication refilled and sent to preferred pharmacy

## 2021-01-30 NOTE — Telephone Encounter (Signed)
FOLLOWED UP/NOTIFIED PATIENT - PT DIDN'T PICK UP SO WRITER LVM

## 2021-02-21 ENCOUNTER — Telehealth (HOSPITAL_COMMUNITY): Payer: Self-pay | Admitting: *Deleted

## 2021-02-21 ENCOUNTER — Other Ambulatory Visit (HOSPITAL_COMMUNITY): Payer: Self-pay | Admitting: Psychiatry

## 2021-02-21 DIAGNOSIS — F900 Attention-deficit hyperactivity disorder, predominantly inattentive type: Secondary | ICD-10-CM

## 2021-02-21 MED ORDER — AMPHETAMINE-DEXTROAMPHETAMINE 20 MG PO TABS: 20.0000 mg | ORAL_TABLET | Freq: Two times a day (BID) | ORAL | 0 refills | Status: DC

## 2021-02-21 NOTE — Telephone Encounter (Signed)
VM request left on writers phone for her adderall to be called in for this month. She should in fact be out of it as of today. Will forward request to Eulis Canner NP.

## 2021-02-21 NOTE — Telephone Encounter (Signed)
Medication refilled and sent to preferred pharmacy

## 2021-03-13 ENCOUNTER — Encounter (HOSPITAL_COMMUNITY): Payer: Self-pay | Admitting: Psychiatry

## 2021-03-13 ENCOUNTER — Telehealth (INDEPENDENT_AMBULATORY_CARE_PROVIDER_SITE_OTHER): Payer: Managed Care, Other (non HMO) | Admitting: Psychiatry

## 2021-03-13 DIAGNOSIS — F3178 Bipolar disorder, in full remission, most recent episode mixed: Secondary | ICD-10-CM | POA: Diagnosis not present

## 2021-03-13 DIAGNOSIS — F319 Bipolar disorder, unspecified: Secondary | ICD-10-CM | POA: Diagnosis not present

## 2021-03-13 DIAGNOSIS — F332 Major depressive disorder, recurrent severe without psychotic features: Secondary | ICD-10-CM | POA: Diagnosis not present

## 2021-03-13 DIAGNOSIS — F411 Generalized anxiety disorder: Secondary | ICD-10-CM

## 2021-03-13 MED ORDER — LAMOTRIGINE 200 MG PO TABS: 200.0000 mg | ORAL_TABLET | Freq: Every day | ORAL | 2 refills | Status: DC

## 2021-03-13 MED ORDER — RISPERIDONE 2 MG PO TABS: 2.0000 mg | ORAL_TABLET | Freq: Every day | ORAL | 3 refills | Status: DC

## 2021-03-13 MED ORDER — GABAPENTIN 300 MG PO CAPS: 300.0000 mg | ORAL_CAPSULE | Freq: Three times a day (TID) | ORAL | 3 refills | Status: DC

## 2021-03-13 MED ORDER — VENLAFAXINE HCL ER 75 MG PO CP24: ORAL_CAPSULE | ORAL | 3 refills | Status: DC

## 2021-03-13 NOTE — Progress Notes (Signed)
BH MD/PA/NP OP Progress Note Virtual Visit via Telephone Note  I connected with Kathryn [name] on 03/13/21 at  9:00 AM EST by telephone and verified that I am speaking with the correct person using two identifiers.  Location: Patient: home Provider: Clinic   I discussed the limitations, risks, security and privacy concerns of performing an evaluation and management service by telephone and the availability of in person appointments. I also discussed with the patient that there may be a patient responsible charge related to this service. The patient expressed understanding and agreed to proceed.   I provided 30 minutes of non-face-to-face time during this encounter.      03/13/2021 9:03 AM Kathryn [name]  MRN:  [id number]  Chief Complaint: "I don't have motivation to do anything"  HPI: 26 year old female seen today for follow up psychiatric evaluation. She has a psychiatric history of bipolar I disorder, ADHD, anxiety, and insomnia. She is currently managed on adderall 20 mg  twice daily, gabapentin 300 mg three times daily, lamictal 200 mg daily, Risperdal 2 mg nightly, and effexor 75 mg daily. Today patient reports that medications are effective in managing her psychiatric conditions.    Today she was unable to logon virtually so her assessment was done over the phone.  During exam she was pleasant, cooperative, and engaged in conversation.  She informed Probation officer that he does not have motivation to do anything.  She notes that she believes that she may be overmedicated.  Provider informed patient that if medications were reduced Effexor would be recommended to be reduced first before mood stabilizers.  She endorsed understanding however notes that she feels like she can cope with her anhedonia.  She also informed Probation officer that she has been forgetful.  Provider asked patient if her forgetfulness is interfering with her activities of daily living.  She notes that it is not and informed Probation officer that she  is able to complete tasks at work and at home.  Provider informed patient that forgetfulness is a side effect of ADHD and recommended patient writing things down.  She endorsed understanding and agreed.    Patient notes that her anxiety and depression are well managed.  Writer conducted a GAD-7 and patient scored a 1, at her last visit she scored 2.  Provider also conducted PHQ-9 and patient scored a 7, at her last visit she scored a 2.  She endorses adequate sleep (8 to 10) and appetite.  Today she denies SI/HI/VAH, mania, or paranoia.    Patient notes that she has boil between her thigh and vagina which she reports is severely painful.  She plans to follow-up with her PCP tomorrow to have this addressed.  She notes that she has been managing her pain Aleve.   No medication changes made today.  Patient agreeable to continue medications as prescribed. Adderall not filled today as it was recently refilled. No other concerns at this time..      Visit Diagnosis:    ICD-10-CM   1. GAD (generalized anxiety disorder)  F41.1 gabapentin (NEURONTIN) 300 MG capsule    venlafaxine XR (EFFEXOR-XR) 75 MG 24 hr capsule    2. Bipolar 1 disorder, depressed (HCC)  F31.9 gabapentin (NEURONTIN) 300 MG capsule    venlafaxine XR (EFFEXOR-XR) 75 MG 24 hr capsule    risperiDONE (RISPERDAL) 2 MG tablet    lamoTRIgine (LAMICTAL) 200 MG tablet    3. Severe episode of recurrent major depressive disorder, without psychotic features (HCC)  F33.2 venlafaxine XR (  EFFEXOR-XR) 75 MG 24 hr capsule      Past Psychiatric History:  bipolar I disorder, ADHD, anxiety, and insomnia.  Past Medical History:  Past Medical History:  Diagnosis Date   Abnormal liver function tests 02/02/2016   ADHD 06/10/2016   Alcohol use 02/23/2016   Allergy    Anxiety    Arm fracture, right    as child, fx in three places, no surgery   Depression    Depression with anxiety 04/22/14   PTSD s/p death of best friend    Essential hypertension  02/02/2016   GERD (gastroesophageal reflux disease)    H/O cold sores 04/15/2015   Headache 04/09/2016   Hyperlipidemia, mixed 02/02/2016   Migraine    Ovarian cyst    PCOS (polycystic ovarian syndrome)    Preventative health care 10/27/2015   Thrombocytosis 04/09/2016   Vaginal Pap smear, abnormal    HGSIL    Past Surgical History:  Procedure Laterality Date   ADENOIDECTOMY  2002   TYMPANOSTOMY TUBE PLACEMENT Bilateral 1999    Family Psychiatric History:  Maternal aunt anxiety, Maternal grandfather anxiety, Mother anxiety and OCD  Family History:  Family History  Problem Relation Age of Onset   Hypertension Mother    Heart disease Mother        CAD, stent at 66, s/p cardiac arrest with Defib   Hyperlipidemia Mother    Diabetes Mother        s/p gastric bypass   Obesity Mother        s/p gastric bypass   OCD Mother    Hyperlipidemia Father    Hypertension Father    Anxiety disorder Father    Asthma Sister    Hyperlipidemia Brother    Anxiety disorder Brother    Stroke Maternal Grandmother    Thyroid disease Maternal Grandmother    Anxiety disorder Maternal Grandmother    Heart disease Maternal Grandfather        MI first at 59, s/p stents and CABG   Hyperlipidemia Maternal Grandfather    Hypertension Maternal Grandfather    Stroke Maternal Grandfather    Cancer Maternal Grandfather        melanoma   Aneurysm Maternal Grandfather        brain   Kidney disease Maternal Grandfather        tumor   Heart disease Paternal Grandmother    Cancer Paternal Grandfather        ung and brain   Heart disease Maternal Uncle    Vision loss Maternal Uncle    Anxiety disorder Maternal Uncle    Anxiety disorder Maternal Aunt    Depression Maternal Aunt     Social History:  Social History   Socioeconomic History   Marital status: Single    Spouse name: Not on file   Number of children: 0   Years of education: 12   Highest education level: Not on file  Occupational  History    Comment: [employer]  Tobacco Use   Smoking status: Heavy Smoker    Packs/day: 1.50    Types: Cigarettes   Smokeless tobacco: Current    Types: Snuff  Vaping Use   Vaping Use: Never used  Substance and Sexual Activity   Alcohol use: No    Alcohol/week: 0.0 standard drinks    Comment: maybe every 6 months   Drug use: Yes    Types: Marijuana    Comment: daily   Sexual activity: Yes    Birth  control/protection: Pill  Other Topics Concern   Not on file  Social History Narrative   Not on file   Social Determinants of Health   Financial Resource Strain: Not on file  Food Insecurity: Not on file  Transportation Needs: Not on file  Physical Activity: Not on file  Stress: Not on file  Social Connections: Not on file    Allergies:  Allergies  Allergen Reactions   Oxycodone Nausea Only    Oxycontin also   Seroquel [Quetiapine Fumarate]     Mental status changes    Metabolic Disorder Labs: Lab Results  Component Value Date   HGBA1C 5.2 11/17/2018   No results found for: PROLACTIN Lab Results  Component Value Date   CHOL 223 (H) 11/17/2018   TRIG 55 11/17/2018   HDL 48 11/17/2018   CHOLHDL 4.6 (H) 11/17/2018   VLDL 21.4 05/05/2017   LDLCALC 166 (H) 11/17/2018   LDLCALC 155 (H) 05/05/2017   Lab Results  Component Value Date   TSH 0.735 11/17/2018   TSH 2.49 05/05/2017    Therapeutic Level Labs: No results found for: LITHIUM No results found for: VALPROATE No components found for:  CBMZ  Current Medications: Current Outpatient Medications  Medication Sig Dispense Refill   albuterol (PROVENTIL HFA;VENTOLIN HFA) 108 (90 Base) MCG/ACT inhaler Inhale 2 puffs into the lungs every 6 (six) hours as needed for wheezing or shortness of breath. 3 Inhaler 0   amphetamine-dextroamphetamine (ADDERALL) 20 MG tablet Take 1 tablet (20 mg total) by mouth 2 (two) times daily. 60 tablet 0   cyclobenzaprine (FLEXERIL) 10 MG tablet Take 1 tablet (10 mg total) by  mouth 2 (two) times daily as needed for muscle spasms. 10 tablet 0   diclofenac (CATAFLAM) 50 MG tablet Take 1 tablet (50 mg total) by mouth 3 (three) times daily as needed. 30 tablet 2   doxycycline (VIBRAMYCIN) 100 MG capsule Take 1 capsule (100 mg total) by mouth 2 (two) times daily. 14 capsule 0   famotidine (PEPCID) 20 MG tablet Take 1 tablet (20 mg total) by mouth 2 (two) times daily. (Patient not taking: Reported on 12/26/2018) 180 tablet 0   gabapentin (NEURONTIN) 300 MG capsule Take 1 capsule (300 mg total) by mouth 3 (three) times daily. 90 capsule 3   lamoTRIgine (LAMICTAL) 200 MG tablet Take 1 tablet (200 mg total) by mouth at bedtime. Take one a day 30 tablet 2   Levonorgestrel-Ethinyl Estradiol (AMETHIA) 0.15-0.03 &0.01 MG tablet Take 1 tablet by mouth daily. 91 tablet 4   mometasone-formoterol (DULERA) 100-5 MCG/ACT AERO Inhale 2 puffs into the lungs 2 (two) times daily. 3 Inhaler 1   montelukast (SINGULAIR) 10 MG tablet Take 1 tablet (10 mg total) by mouth at bedtime. 90 tablet 0   omeprazole (PRILOSEC) 40 MG capsule Take 1 capsule (40 mg total) by mouth 2 (two) times daily. 180 capsule 3   ondansetron (ZOFRAN) 4 MG tablet Take 1 tablet (4 mg total) by mouth every 8 (eight) hours as needed for nausea or vomiting. 20 tablet 0   risperiDONE (RISPERDAL) 2 MG tablet Take 1 tablet (2 mg total) by mouth at bedtime. 60 tablet 3   valACYclovir (VALTREX) 1000 MG tablet Take 1 tablet (1,000 mg total) by mouth daily. 90 tablet 4   venlafaxine XR (EFFEXOR-XR) 75 MG 24 hr capsule TAKE ONE CAPSULE BY MOUTH DAILY  WITH BREAKFAST 30 capsule 3   No current facility-administered medications for this visit.     Musculoskeletal: Strength &  Muscle Tone:  Unable to assess due to telephone visit Gait & Station:  Unable to assess due to telephone visit Patient leans: N/A  Psychiatric Specialty Exam: Review of Systems  There were no vitals taken for this visit.There is no height or weight on file to  calculate BMI.  General Appearance:  Unable to assess due to telephone visit  Eye Contact:   Unable to assess due to telephone visit  Speech:  Clear and Coherent and Normal Rate  Volume:  Normal  Mood:  Euthymic  Affect:  Appropriate and Congruent  Thought Process:  Coherent, Goal Directed and Linear  Orientation:  Full (Time, Place, and Person)  Thought Content: WDL and Logical   Suicidal Thoughts:  No  Homicidal Thoughts:  No  Memory:  Immediate;   Good Recent;   Good Remote;   Good  Judgement:  Good  Insight:  Good  Psychomotor Activity:   Unable to assess due to telephone visit  Concentration:  Concentration: Good and Attention Span: Good  Recall:  Good  Fund of Knowledge: Good  Language: Good  Akathisia:  No  Handed:  Right  AIMS (if indicated): Not done  Assets:  Communication Skills Desire for Improvement Financial Resources/Insurance Housing Social Support  ADL's:  Intact  Cognition: WNL  Sleep:  Good   Screenings: GAD-7    Flowsheet Row Video Visit from 03/13/2021 in [facility] Video Visit from 12/19/2020 in [facility] Video Visit from 09/19/2020 in [facility] Video Visit from 06/19/2020 in [facility] Video Visit from 03/21/2020 in [facility]  Total GAD-7 Score 1 2 6 6 14       PHQ2-9    Flowsheet Row Video Visit from 03/13/2021 in [facility] Video Visit from 12/19/2020 in [facility] Video Visit from 09/19/2020 in [facility] Video Visit from 06/19/2020 in [facility] Video Visit from 03/21/2020 in [place]  PHQ-2 Total Score 2 1 1 2 4   PHQ-9 Total Score 7 2 4 9 18         Assessment and Plan: Patient endorses anhedonia and forgetfulness.  She however reports that she is able to  cope with it.  Provider informed patient to write things down to help assist with forgetfulness.  She informed Probation officer that her forgetfulness is not interfering with her activities of daily living.  Patient also notes that she feels overmedicated.  Provider informed patient that if her medications were reduced Effexor would be reduced first prior to mood stabilizers.  She endorsed understanding and agreed.  At this time no medication changes made today.  Patient agreeable to continue medication as prescribed.  Adderall not filled today as it was recently refilled.  1. GAD (generalized anxiety disorder)  Continue- venlafaxine XR (EFFEXOR-XR) 75 MG 24 hr capsule; Take 1 capsule (75 mg total) by mouth daily with breakfast.  Dispense: 30 capsule; Refill: 3 Continue- gabapentin (NEURONTIN) 300 MG capsule; Take 1 capsule (300 mg total) by mouth 3 (three) times daily.  Dispense: 90 capsule; Refill: 3  2. Bipolar 1 disorder, depressed (HCC)  Continue- risperiDONE (RISPERDAL) 2 MG tablet; Take 1 tablet (2 mg total) by mouth at bedtime.  Dispense: 60 tablet; Refill: 3 Continue- lamoTRIgine (LAMICTAL) 200 MG tablet; Take 1 tablet (200 mg total) by mouth at bedtime. Take one a day  Dispense: 30 tablet;  Refill: 3 Continue- venlafaxine XR (EFFEXOR-XR) 75 MG 24 hr capsule; Take 1 capsule (75 mg total) by mouth daily with breakfast.  Dispense: 30 capsule; Refill: 3 Continue- gabapentin (NEURONTIN) 300 MG capsule; Take 1 capsule (300 mg total) by mouth 3 (three) times daily.  Dispense: 90 capsule; Refill: 3  3. Severe episode of recurrent major depressive disorder, without psychotic features ([place])  Continue- venlafaxine XR (EFFEXOR-XR) 75 MG 24 hr capsule; Take 1 capsule (75 mg total) by mouth daily with breakfast.  Dispense: 30 capsule; Refill: 3  4. Attention deficit hyperactivity disorder (ADHD), predominantly inattentive type    Follow up in 3 months  Salley Slaughter, NP 03/13/2021, 9:03 AM

## 2021-03-24 ENCOUNTER — Other Ambulatory Visit (HOSPITAL_COMMUNITY): Payer: Self-pay | Admitting: Psychiatry

## 2021-03-24 ENCOUNTER — Telehealth (HOSPITAL_COMMUNITY): Payer: Self-pay | Admitting: *Deleted

## 2021-03-24 DIAGNOSIS — F900 Attention-deficit hyperactivity disorder, predominantly inattentive type: Secondary | ICD-10-CM

## 2021-03-24 MED ORDER — AMPHETAMINE-DEXTROAMPHETAMINE 20 MG PO TABS: 20.0000 mg | ORAL_TABLET | Freq: Two times a day (BID) | ORAL | 0 refills | Status: DC

## 2021-03-24 NOTE — Telephone Encounter (Signed)
Patient called requested refill on amphetamine-dextroamphetamine (ADDERALL) 20 MG tablet

## 2021-03-24 NOTE — Telephone Encounter (Signed)
Medication refilled and sent to preferred pharmacy

## 2021-04-15 ENCOUNTER — Other Ambulatory Visit: Payer: Self-pay

## 2021-04-15 DIAGNOSIS — N946 Dysmenorrhea, unspecified: Secondary | ICD-10-CM

## 2021-04-15 MED ORDER — LEVONORGEST-ETH ESTRAD 91-DAY 0.15-0.03 &0.01 MG PO TABS: 1.0000 | ORAL_TABLET | Freq: Every day | ORAL | 4 refills | Status: DC

## 2021-04-24 ENCOUNTER — Ambulatory Visit (HOSPITAL_BASED_OUTPATIENT_CLINIC_OR_DEPARTMENT_OTHER)
Admission: RE | Admit: 2021-04-24 | Discharge: 2021-04-24 | Disposition: A | Discharge status: Home / Self Care | Payer: Managed Care, Other (non HMO) | Source: Ambulatory Visit | Attending: Family Medicine | Admitting: Family Medicine

## 2021-04-24 ENCOUNTER — Encounter: Payer: Self-pay | Admitting: Family Medicine

## 2021-04-24 ENCOUNTER — Other Ambulatory Visit: Payer: Self-pay | Admitting: Family Medicine

## 2021-04-24 ENCOUNTER — Ambulatory Visit (INDEPENDENT_AMBULATORY_CARE_PROVIDER_SITE_OTHER): Payer: Managed Care, Other (non HMO) | Admitting: Family Medicine

## 2021-04-24 ENCOUNTER — Other Ambulatory Visit: Payer: Self-pay

## 2021-04-24 VITALS — BP 110/90 | HR 106 | Temp 97.7°F | Resp 18 | Ht 68.5 in

## 2021-04-24 DIAGNOSIS — S3992XA Unspecified injury of lower back, initial encounter: Secondary | ICD-10-CM | POA: Diagnosis present

## 2021-04-24 DIAGNOSIS — M545 Low back pain, unspecified: Secondary | ICD-10-CM

## 2021-04-24 DIAGNOSIS — S3210XA Unspecified fracture of sacrum, initial encounter for closed fracture: Secondary | ICD-10-CM

## 2021-04-24 MED ORDER — OXYCODONE-ACETAMINOPHEN 5-325 MG PO TABS: 1.0000 | ORAL_TABLET | Freq: Four times a day (QID) | ORAL | 0 refills | Status: AC | PRN

## 2021-04-24 MED ORDER — TRAMADOL HCL 50 MG PO TABS
50.0000 mg | ORAL_TABLET | Freq: Three times a day (TID) | ORAL | 0 refills | Status: DC | PRN
Start: 2021-04-24 — End: 2021-04-24

## 2021-04-24 NOTE — Assessment & Plan Note (Signed)
Alt ice and heat  ?Pain med sent in ?Donut pillow  ?xrays today ?rto 2 weeks or sooner prn ?

## 2021-04-24 NOTE — Progress Notes (Addendum)
Subjective:   By signing my name below, I, Shehryar Baig, attest that this documentation has been prepared under the direction and in the presence of Ann Held, DO. 04/24/2021    Patient ID: Kathryn [name], female    DOB: 23-Nov-1995, 26 y.o.   MRN: [id number]  Chief Complaint  Patient presents with   Tailbone Pain    Pt states having a fall on Sunday. Pt states she was roller skating and fell.     HPI Patient is in today for a office visit.   She complains of pain in her tailbone and low back for the past 4 days. She reports falling backwards while using roller blades and falling directly on her tailbone and feeling pain immediately. She did not hear any crunch or pop noise during her fall. She has bruised her tailbone in the past but finds her current pain more severe. She is taking OTC tylenol to manage her pain. She is also applying ice to the painful area. She cannot sit down with out pain so she is typically standing up. When sleeping she wakes up easily if she rolls on her tailbone. She denies having any muscle spasms, pregnancy, radiating pain. She has no issues while working from her pain and has continued working following her injury.    Past Medical History:  Diagnosis Date   Abnormal liver function tests 02/02/2016   ADHD 06/10/2016   Alcohol use 02/23/2016   Allergy    Anxiety    Arm fracture, right    as child, fx in three places, no surgery   Depression    Depression with anxiety 05/03/2014   PTSD s/p death of best friend    Essential hypertension 02/02/2016   GERD (gastroesophageal reflux disease)    H/O cold sores 04/15/2015   Headache 04/09/2016   Hyperlipidemia, mixed 02/02/2016   Migraine    Ovarian cyst    PCOS (polycystic ovarian syndrome)    Preventative health care 10/27/2015   Thrombocytosis 04/09/2016   Vaginal Pap smear, abnormal    HGSIL    Past Surgical History:  Procedure Laterality Date   ADENOIDECTOMY  2002   TYMPANOSTOMY TUBE  PLACEMENT Bilateral 1999    Family History  Problem Relation Age of Onset   Hypertension Mother    Heart disease Mother        CAD, stent at 77, s/p cardiac arrest with Defib   Hyperlipidemia Mother    Diabetes Mother        s/p gastric bypass   Obesity Mother        s/p gastric bypass   OCD Mother    Hyperlipidemia Father    Hypertension Father    Anxiety disorder Father    Asthma Sister    Hyperlipidemia Brother    Anxiety disorder Brother    Stroke Maternal Grandmother    Thyroid disease Maternal Grandmother    Anxiety disorder Maternal Grandmother    Heart disease Maternal Grandfather        MI first at 37, s/p stents and CABG   Hyperlipidemia Maternal Grandfather    Hypertension Maternal Grandfather    Stroke Maternal Grandfather    Cancer Maternal Grandfather        melanoma   Aneurysm Maternal Grandfather        brain   Kidney disease Maternal Grandfather        tumor   Heart disease Paternal Grandmother    Cancer Paternal Grandfather  ung and brain   Heart disease Maternal Uncle    Vision loss Maternal Uncle    Anxiety disorder Maternal Uncle    Anxiety disorder Maternal Aunt    Depression Maternal Aunt     Social History   Socioeconomic History   Marital status: Single    Spouse name: Not on file   Number of children: 0   Years of education: 12   Highest education level: Not on file  Occupational History    Comment: [employer]  Tobacco Use   Smoking status: Heavy Smoker    Packs/day: 1.50    Types: Cigarettes   Smokeless tobacco: Current    Types: Snuff  Vaping Use   Vaping Use: Never used  Substance and Sexual Activity   Alcohol use: No    Alcohol/week: 0.0 standard drinks    Comment: maybe every 6 months   Drug use: Yes    Types: Marijuana    Comment: daily   Sexual activity: Yes    Birth control/protection: Pill  Other Topics Concern   Not on file  Social History Narrative   Not on file   Social Determinants of Health    Financial Resource Strain: Not on file  Food Insecurity: Not on file  Transportation Needs: Not on file  Physical Activity: Not on file  Stress: Not on file  Social Connections: Not on file  Intimate Partner Violence: Not on file    Outpatient Medications Prior to Visit  Medication Sig Dispense Refill   amphetamine-dextroamphetamine (ADDERALL) 20 MG tablet Take 1 tablet (20 mg total) by mouth 2 (two) times daily. 60 tablet 0   lamoTRIgine (LAMICTAL) 200 MG tablet Take 1 tablet (200 mg total) by mouth at bedtime. Take one a day 30 tablet 2   Levonorgestrel-Ethinyl Estradiol (AMETHIA) 0.15-0.03 &0.01 MG tablet Take 1 tablet by mouth daily. 91 tablet 4   omeprazole (PRILOSEC) 40 MG capsule Take 1 capsule (40 mg total) by mouth 2 (two) times daily. 180 capsule 3   risperiDONE (RISPERDAL) 2 MG tablet Take 1 tablet (2 mg total) by mouth at bedtime. 60 tablet 3   valACYclovir (VALTREX) 1000 MG tablet Take 1 tablet (1,000 mg total) by mouth daily. 90 tablet 4   venlafaxine XR (EFFEXOR-XR) 75 MG 24 hr capsule TAKE ONE CAPSULE BY MOUTH DAILY  WITH BREAKFAST 30 capsule 3   doxycycline (VIBRAMYCIN) 100 MG capsule Take 1 capsule (100 mg total) by mouth 2 (two) times daily. 14 capsule 0   gabapentin (NEURONTIN) 300 MG capsule Take 1 capsule (300 mg total) by mouth 3 (three) times daily. 90 capsule 3   albuterol (PROVENTIL HFA;VENTOLIN HFA) 108 (90 Base) MCG/ACT inhaler Inhale 2 puffs into the lungs every 6 (six) hours as needed for wheezing or shortness of breath. (Patient not taking: Reported on 04/24/2021) 3 Inhaler 0   cyclobenzaprine (FLEXERIL) 10 MG tablet Take 1 tablet (10 mg total) by mouth 2 (two) times daily as needed for muscle spasms. (Patient not taking: Reported on 04/24/2021) 10 tablet 0   diclofenac (CATAFLAM) 50 MG tablet Take 1 tablet (50 mg total) by mouth 3 (three) times daily as needed. (Patient not taking: Reported on 04/24/2021) 30 tablet 2   famotidine (PEPCID) 20 MG tablet Take 1  tablet (20 mg total) by mouth 2 (two) times daily. (Patient not taking: Reported on 12/26/2018) 180 tablet 0   mometasone-formoterol (DULERA) 100-5 MCG/ACT AERO Inhale 2 puffs into the lungs 2 (two) times daily. (Patient not taking:  Reported on 04/24/2021) 3 Inhaler 1   montelukast (SINGULAIR) 10 MG tablet Take 1 tablet (10 mg total) by mouth at bedtime. (Patient not taking: Reported on 04/24/2021) 90 tablet 0   ondansetron (ZOFRAN) 4 MG tablet Take 1 tablet (4 mg total) by mouth every 8 (eight) hours as needed for nausea or vomiting. (Patient not taking: Reported on 04/24/2021) 20 tablet 0   No facility-administered medications prior to visit.    Allergies  Allergen Reactions   Oxycodone Nausea Only    Oxycontin also   Seroquel [Quetiapine Fumarate]     Mental status changes    Review of Systems  Constitutional:  Negative for fever and malaise/fatigue.  HENT:  Negative for congestion.   Eyes:  Negative for blurred vision.  Respiratory:  Negative for cough and shortness of breath.   Cardiovascular:  Negative for chest pain, palpitations and leg swelling.  Gastrointestinal:  Negative for vomiting.  Musculoskeletal:  Positive for back pain (low).       (+)tailbone pain  Skin:  Negative for rash.  Neurological:  Negative for loss of consciousness and headaches.      Objective:    Physical Exam Vitals and nursing note reviewed.  Constitutional:      General: She is not in acute distress.    Appearance: Normal appearance. She is not ill-appearing.  HENT:     Head: Normocephalic and atraumatic.     Right Ear: External ear normal.     Left Ear: External ear normal.  Eyes:     Extraocular Movements: Extraocular movements intact.     Pupils: Pupils are equal, round, and reactive to light.  Cardiovascular:     Rate and Rhythm: Normal rate and regular rhythm.     Heart sounds: Normal heart sounds. No murmur heard.   No gallop.  Pulmonary:     Effort: Pulmonary effort is normal. No  respiratory distress.     Breath sounds: Normal breath sounds. No wheezing or rales.  Musculoskeletal:        General: Tenderness and signs of injury present.     Lumbar back: Tenderness present. No spasms. Decreased range of motion.       Back:     Comments: + low back and coccyx pain No bruising No swelling   Skin:    General: Skin is warm and dry.  Neurological:     Mental Status: She is alert and oriented to person, place, and time.  Psychiatric:        Judgment: Judgment normal.    BP 110/90 (BP Location: Left Arm, Patient Position: Standing, Cuff Size: Normal)    Pulse (!) 106    Temp 97.7 F (36.5 C) (Oral)    Resp 18    Ht 5' 8.5" (1.74 m)    SpO2 98%    BMI 24.12 kg/m  Wt Readings from Last 3 Encounters:  12/13/19 161 lb (73 kg)  05/22/19 147 lb (66.7 kg)  01/30/19 148 lb (67.1 kg)    Diabetic Foot Exam - Simple   No data filed    Lab Results  Component Value Date   WBC 9.5 05/22/2019   HGB 14.4 05/22/2019   HCT 42.8 05/22/2019   PLT 388 05/22/2019   GLUCOSE 66 (L) 05/22/2019   CHOL 223 (H) 11/17/2018   TRIG 55 11/17/2018   HDL 48 11/17/2018   LDLDIRECT 138.0 01/22/2016   LDLCALC 166 (H) 11/17/2018   ALT 22 05/22/2019   AST 22 05/22/2019  NA 138 05/22/2019   K 3.8 05/22/2019   CL 102 05/22/2019   CREATININE 0.83 05/22/2019   BUN 8 05/22/2019   CO2 26 05/22/2019   TSH 0.735 11/17/2018   HGBA1C 5.2 11/17/2018    Lab Results  Component Value Date   TSH 0.735 11/17/2018   Lab Results  Component Value Date   WBC 9.5 05/22/2019   HGB 14.4 05/22/2019   HCT 42.8 05/22/2019   MCV 99.1 05/22/2019   PLT 388 05/22/2019   Lab Results  Component Value Date   NA 138 05/22/2019   K 3.8 05/22/2019   CO2 26 05/22/2019   GLUCOSE 66 (L) 05/22/2019   BUN 8 05/22/2019   CREATININE 0.83 05/22/2019   BILITOT 0.4 05/22/2019   ALKPHOS 57 05/22/2019   AST 22 05/22/2019   ALT 22 05/22/2019   PROT 7.3 05/22/2019   ALBUMIN 4.4 05/22/2019   CALCIUM 8.9  05/22/2019   ANIONGAP 10 05/22/2019   GFR 82.98 05/05/2017   Lab Results  Component Value Date   CHOL 223 (H) 11/17/2018   Lab Results  Component Value Date   HDL 48 11/17/2018   Lab Results  Component Value Date   LDLCALC 166 (H) 11/17/2018   Lab Results  Component Value Date   TRIG 55 11/17/2018   Lab Results  Component Value Date   CHOLHDL 4.6 (H) 11/17/2018   Lab Results  Component Value Date   HGBA1C 5.2 11/17/2018       Assessment & Plan:   Problem List Items Addressed This Visit       Unprioritized   Low back pain - Primary   Relevant Medications   traMADol (ULTRAM) 50 MG tablet   Other Relevant Orders   DG Lumbar Spine Complete   DG Sacrum/Coccyx   Other Visit Diagnoses     Tailbone injury, initial encounter       Relevant Medications   traMADol (ULTRAM) 50 MG tablet   Other Relevant Orders   DG Lumbar Spine Complete   DG Sacrum/Coccyx        Meds ordered this encounter  Medications   traMADol (ULTRAM) 50 MG tablet    Sig: Take 1 tablet (50 mg total) by mouth every 8 (eight) hours as needed for up to 5 days.    Dispense:  15 tablet    Refill:  0    I, Ann Held, DO, personally preformed the services described in this documentation.  All medical record entries made by the scribe were at my direction and in my presence.  I have reviewed the chart and discharge instructions (if applicable) and agree that the record reflects my personal performance and is accurate and complete. 04/24/2021   I,Shehryar Baig,acting as a scribe for Ann Held, DO.,have documented all relevant documentation on the behalf of Ann Held, DO,as directed by  Ann Held, DO while in the presence of Ann Held, DO.   Ann Held, DO

## 2021-04-24 NOTE — Patient Instructions (Signed)

## 2021-05-01 ENCOUNTER — Telehealth (HOSPITAL_COMMUNITY): Payer: Self-pay | Admitting: *Deleted

## 2021-05-01 ENCOUNTER — Encounter: Payer: Self-pay | Admitting: Family Medicine

## 2021-05-01 ENCOUNTER — Other Ambulatory Visit (HOSPITAL_COMMUNITY): Payer: Self-pay | Admitting: Physician Assistant

## 2021-05-01 MED ORDER — AMPHETAMINE-DEXTROAMPHETAMINE 20 MG PO TABS: 20.0000 mg | ORAL_TABLET | Freq: Two times a day (BID) | ORAL | 0 refills | Status: DC

## 2021-05-01 NOTE — Telephone Encounter (Signed)
Patient called back after speaking with Ce Ce NP. She described her as "incredibly rude" and doesn't want to see her going forward. Ce Ce maintained she would need to see her before giving her a rx for her Adderall. Patient says she can not keep the appt we changed her to with Ce Ce on the 20 th of march due to her work schedule. Discussed situation with my supervisor and will send this request over to Dr Adele Schilder to resolve.  ?

## 2021-05-01 NOTE — Progress Notes (Signed)
Provider was contacted by Glory Buff. Olevia Bowens, RN regarding patient's request for Adderall prescription refill.  Patient is normally being seen by Dr. Ronne Binning, but Dr. Ronne Binning is currently at the office.  Patient was last e-prescribed Adderall on February 6th, 2023.  Patient should have been out of her prescription on 3/8 which coincides with the time in which she is requesting refills on her prescription.  Provider to e-prescribed patient's Adderall prescription to preferred pharmacy.  Per chart review, patient has a follow-up appointment scheduled for 05/05/2021. ?

## 2021-05-01 NOTE — Telephone Encounter (Signed)
Called patient back after consulting with Ce Ce NP re patients request for new rx for her adderall. She was told an rx would not be available to her per provider till she is seen on the 20th as a virtual appt. 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 Her appts are made in advance per Brittneys "direction for a reason". She asked to speak with providers superior, her superior is out of the office till next week. She demanded to speak with the provider covering for Brittneys leave of absence. I will forward this concern to Ce Ce to ask her to call the patient back.  ?

## 2021-05-01 NOTE — Telephone Encounter (Signed)
VM from patient requesting her Adderall be sent in. Per her chart she should have been out of Adderall on or around 3/8. Will forward request to the provider filling in for Brittney who is away on leave.  ?

## 2021-05-01 NOTE — Telephone Encounter (Signed)
I reviewed the chart.  Adderall prescription was sent today earlier by provider.  Please let me know if there is any more questions. ?

## 2021-05-05 ENCOUNTER — Telehealth (HOSPITAL_COMMUNITY): Payer: No Payment, Other | Admitting: Psychiatry

## 2021-05-05 ENCOUNTER — Other Ambulatory Visit: Payer: Self-pay | Admitting: Family Medicine

## 2021-05-05 DIAGNOSIS — S3210XA Unspecified fracture of sacrum, initial encounter for closed fracture: Secondary | ICD-10-CM

## 2021-05-05 MED ORDER — HYDROCODONE-ACETAMINOPHEN 5-325 MG PO TABS: 1.0000 | ORAL_TABLET | Freq: Four times a day (QID) | ORAL | 0 refills | Status: DC | PRN

## 2021-05-06 ENCOUNTER — Ambulatory Visit: Payer: Managed Care, Other (non HMO) | Admitting: Family Medicine

## 2021-05-07 ENCOUNTER — Telehealth (HOSPITAL_COMMUNITY): Payer: Self-pay | Admitting: *Deleted

## 2021-05-07 NOTE — Telephone Encounter (Signed)
Call from patient wanting to make a complaint to Ce Ce NP's supervisor and secondly to make an interim appt with a different provider other than Ce Ce till Eulis Canner NP returns from maternity leave. I spoke with Lyric at the front desk, and to [place], my supervisor re these two requests. I called her back and explained she would seen by Ludwig Clarks PA till [place] returns. The front desk is aware that this was Ok'd by Raquel Sarna and to schedule her with him, then for her to leave her concern with front desk staff and they will email Dr Dwyane Dee with her concerns for her to call Donie back.  ?

## 2021-05-27 ENCOUNTER — Encounter (HOSPITAL_COMMUNITY): Payer: Self-pay | Admitting: Physician Assistant

## 2021-05-27 ENCOUNTER — Telehealth (INDEPENDENT_AMBULATORY_CARE_PROVIDER_SITE_OTHER): Payer: BC Managed Care – PPO | Admitting: Physician Assistant

## 2021-05-27 DIAGNOSIS — F411 Generalized anxiety disorder: Secondary | ICD-10-CM | POA: Diagnosis not present

## 2021-05-27 DIAGNOSIS — F319 Bipolar disorder, unspecified: Secondary | ICD-10-CM

## 2021-05-27 DIAGNOSIS — F332 Major depressive disorder, recurrent severe without psychotic features: Secondary | ICD-10-CM

## 2021-05-27 MED ORDER — VENLAFAXINE HCL ER 75 MG PO CP24: ORAL_CAPSULE | ORAL | 3 refills | Status: DC

## 2021-05-27 MED ORDER — LAMOTRIGINE 200 MG PO TABS: 200.0000 mg | ORAL_TABLET | Freq: Every day | ORAL | 2 refills | Status: DC

## 2021-05-27 MED ORDER — RISPERIDONE 2 MG PO TABS: 2.0000 mg | ORAL_TABLET | Freq: Every day | ORAL | 3 refills | Status: DC

## 2021-05-27 NOTE — Progress Notes (Addendum)
[place] MD/PA/NP OP Progress Note  Virtual Visit via Telephone Note  I connected with Kathryn [name] on 05/27/21 at  3:30 PM EDT by telephone and verified that I am speaking with the correct person using two identifiers.  Location: Patient: Home Provider: Clinic   I discussed the limitations, risks, security and privacy concerns of performing an evaluation and management service by telephone and the availability of in person appointments. I also discussed with the patient that there may be a patient responsible charge related to this service. The patient expressed understanding and agreed to proceed.  Follow Up Instructions:  I discussed the assessment and treatment plan with the patient. The patient was provided an opportunity to ask questions and all were answered. The patient agreed with the plan and demonstrated an understanding of the instructions.   The patient was advised to call back or seek an in-person evaluation if the symptoms worsen or if the condition fails to improve as anticipated.  I provided 12 minutes of non-face-to-face time during this encounter.  Kathryn Mood, PA   05/27/2021 11:17 PM Kathryn [name]  MRN:  [id number]  Chief Complaint:  Chief Complaint  Patient presents with   Follow-up   Medication Refill   HPI:   Kathryn [name] is a 26 year old female with a past psychiatric history significant for generalized anxiety disorder, attention deficit disorder (unspecified hyperactivity presents), and bipolar 1 disorder (depressed) who presents to [facility] via virtual telephone visit for follow-up and medication management.  Patient is currently being managed on the following medications:  Adderall 20 mg 2 times daily Risperidone 2 mg at bedtime Venlafaxine XR (Effexor XR) 75 mg 24-hour capsule daily Lamotrigine 200 mg at bedtime  Patient reports no issues or concerns regarding her current medication regimen.  She denies  experiencing any adverse effects when taking her medications and states that things have been going well.  She reports recently coming out of a depressive episode characterized by fatigue and exhaustion.  She reports that the depressive episode lasted for roughly a month before the improvement of her [name].  Patient denies anxiety and further denies any new stressors at this time.  A PHQ-9 screen was performed with the patient scoring a 6.  A GAD-7 screen was also performed with the patient scoring a 3.  Patient is alert and oriented x4, calm, cooperative, and fully engaged in conversation during the encounter.  Patient endorses good [name].  She reports feeling happy and relaxed.  Patient is currently on vacation visiting the [employer].  Patient denies suicidal or homicidal ideations.  She further denied auditory or visual hallucinations and does not appear to be responding to internal/external stimuli.  Patient endorses fair sleep and receives on average 5 hours of sleep each night.  She endorses disrupted sleep due to her injured tailbone.  Patient endorses good appetite and eats on average 2 meals per day.  Patient endorses alcohol consumption occasionally.  Patient endorses tobacco use and smokes on average 20 cigarettes/day.  Patient denies illicit drug use.  Visit Diagnosis:    ICD-10-CM   1. GAD (generalized anxiety disorder)  F41.1 venlafaxine XR (EFFEXOR-XR) 75 MG 24 hr capsule    2. Bipolar 1 disorder, depressed (HCC)  F31.9 venlafaxine XR (EFFEXOR-XR) 75 MG 24 hr capsule    risperiDONE (RISPERDAL) 2 MG tablet    lamoTRIgine (LAMICTAL) 200 MG tablet      Past Psychiatric History:  Bipolar I disorder ADHD Anxiety Insomnia.  Past  Medical History:  Past Medical History:  Diagnosis Date   Abnormal liver function tests 02/02/2016   ADHD 06/10/2016   Alcohol use 02/23/2016   Allergy    Anxiety    Arm fracture, right    as child, fx in three places, no surgery   Depression     Depression with anxiety 2014/04/19   PTSD s/p death of best friend    Essential hypertension 02/02/2016   GERD (gastroesophageal reflux disease)    H/O cold sores 04/15/2015   Headache 04/09/2016   Hyperlipidemia, mixed 02/02/2016   Migraine    Ovarian cyst    PCOS (polycystic ovarian syndrome)    Preventative health care 10/27/2015   Thrombocytosis 04/09/2016   Vaginal Pap smear, abnormal    HGSIL    Past Surgical History:  Procedure Laterality Date   ADENOIDECTOMY  2002   TYMPANOSTOMY TUBE PLACEMENT Bilateral 1999    Family Psychiatric History:  Maternal aunt - anxiety Maternal grandfather - anxiety Mother - anxiety and OCD  Family History:  Family History  Problem Relation Age of Onset   Hypertension Mother    Heart disease Mother        CAD, stent at 4, s/p cardiac arrest with Defib   Hyperlipidemia Mother    Diabetes Mother        s/p gastric bypass   Obesity Mother        s/p gastric bypass   OCD Mother    Hyperlipidemia Father    Hypertension Father    Anxiety disorder Father    Asthma Sister    Hyperlipidemia Brother    Anxiety disorder Brother    Stroke Maternal Grandmother    Thyroid disease Maternal Grandmother    Anxiety disorder Maternal Grandmother    Heart disease Maternal Grandfather        MI first at 93, s/p stents and CABG   Hyperlipidemia Maternal Grandfather    Hypertension Maternal Grandfather    Stroke Maternal Grandfather    Cancer Maternal Grandfather        melanoma   Aneurysm Maternal Grandfather        brain   Kidney disease Maternal Grandfather        tumor   Heart disease Paternal Grandmother    Cancer Paternal Grandfather        ung and brain   Heart disease Maternal Uncle    Vision loss Maternal Uncle    Anxiety disorder Maternal Uncle    Anxiety disorder Maternal Aunt    Depression Maternal Aunt     Social History:  Social History   Socioeconomic History   Marital status: Single    Spouse name: Not on file   Number  of children: 0   Years of education: 12   Highest education level: Not on file  Occupational History    Comment: [employer]  Tobacco Use   Smoking status: Heavy Smoker    Packs/day: 1.50    Types: Cigarettes   Smokeless tobacco: Current    Types: Snuff  Vaping Use   Vaping Use: Never used  Substance and Sexual Activity   Alcohol use: No    Alcohol/week: 0.0 standard drinks    Comment: maybe every 6 months   Drug use: Yes    Types: Marijuana    Comment: daily   Sexual activity: Yes    Birth control/protection: Pill  Other Topics Concern   Not on file  Social History Narrative   Not on file  Social Determinants of Health   Financial Resource Strain: Not on file  Food Insecurity: Not on file  Transportation Needs: Not on file  Physical Activity: Not on file  Stress: Not on file  Social Connections: Not on file    Allergies:  Allergies  Allergen Reactions   Oxycodone Nausea Only    Oxycontin also   Seroquel [Quetiapine Fumarate]     Mental status changes    Metabolic Disorder Labs: Lab Results  Component Value Date   HGBA1C 5.2 11/17/2018   No results found for: PROLACTIN Lab Results  Component Value Date   CHOL 223 (H) 11/17/2018   TRIG 55 11/17/2018   HDL 48 11/17/2018   CHOLHDL 4.6 (H) 11/17/2018   VLDL 21.4 05/05/2017   LDLCALC 166 (H) 11/17/2018   LDLCALC 155 (H) 05/05/2017   Lab Results  Component Value Date   TSH 0.735 11/17/2018   TSH 2.49 05/05/2017    Therapeutic Level Labs: No results found for: LITHIUM No results found for: VALPROATE No components found for:  CBMZ  Current Medications: Current Outpatient Medications  Medication Sig Dispense Refill   amphetamine-dextroamphetamine (ADDERALL) 20 MG tablet Take 1 tablet (20 mg total) by mouth 2 (two) times daily. 60 tablet 0   HYDROcodone-acetaminophen (NORCO) 5-325 MG tablet Take 1 tablet by mouth every 6 (six) hours as needed for moderate pain. 30 tablet 0   lamoTRIgine  (LAMICTAL) 200 MG tablet Take 1 tablet (200 mg total) by mouth at bedtime. Take one a day 30 tablet 2   Levonorgestrel-Ethinyl Estradiol (AMETHIA) 0.15-0.03 &0.01 MG tablet Take 1 tablet by mouth daily. 91 tablet 4   omeprazole (PRILOSEC) 40 MG capsule Take 1 capsule (40 mg total) by mouth 2 (two) times daily. 180 capsule 3   risperiDONE (RISPERDAL) 2 MG tablet Take 1 tablet (2 mg total) by mouth at bedtime. 60 tablet 3   valACYclovir (VALTREX) 1000 MG tablet Take 1 tablet (1,000 mg total) by mouth daily. 90 tablet 4   venlafaxine XR (EFFEXOR-XR) 75 MG 24 hr capsule TAKE ONE CAPSULE BY MOUTH DAILY  WITH BREAKFAST 30 capsule 3   No current facility-administered medications for this visit.     Musculoskeletal: Strength & Muscle Tone: Unable to assess due to telemedicine visit [place]: Unable to assess due to telemedicine visit Patient leans: Unable to assess due to telemedicine visit  Psychiatric Specialty Exam: Review of Systems  Psychiatric/Behavioral:  Positive for sleep disturbance. Negative for decreased concentration, dysphoric [name], hallucinations, self-injury and suicidal ideas. The patient is not nervous/anxious and is not hyperactive.    There were no vitals taken for this visit.There is no height or weight on file to calculate BMI.  General Appearance: Unable to assess due to telemedicine visit  Eye Contact:  Unable to assess due to telemedicine visit  Speech:  Clear and Coherent and Normal Rate  Volume:  Normal  [name]:  Euthymic  Affect:  Appropriate  Thought Process:  Coherent and Descriptions of Associations: Intact  Orientation:  Full (Time, Place, and Person)  Thought Content: WDL   Suicidal Thoughts:  No  Homicidal Thoughts:  No  Memory:  Immediate;   Good Recent;   Good Remote;   Good  Judgement:  Good  Insight:  Good  Psychomotor Activity:  Normal  Concentration:  Concentration: Good and Attention Span: Good  Recall:  Good  Fund of Knowledge: Good   Language: Good  Akathisia:  No  Handed:  Right  AIMS (if indicated):  not done  Assets:  Communication Skills Desire for Improvement Financial Resources/Insurance Housing Social Support  ADL's:  Intact  Cognition: WNL  Sleep:  Fair   Screenings: GAD-7    Flowsheet Row Video Visit from 05/27/2021 in [facility] Video Visit from 03/13/2021 in [facility] Video Visit from 12/19/2020 in [facility] Video Visit from 09/19/2020 in [facility] Video Visit from 06/19/2020 in [facility]  Total GAD-7 Score '3 1 2 6 6      '$ PHQ2-9    Flowsheet Row Video Visit from 05/27/2021 in [facility] Video Visit from 03/13/2021 in [facility] Video Visit from 12/19/2020 in [facility] Video Visit from 09/19/2020 in [facility] Video Visit from 06/19/2020 in [place]  PHQ-2 Total Score '3 2 1 1 2  '$ PHQ-9 Total Score '6 7 2 4 9      '$ Flowsheet Row Video Visit from 05/27/2021 in [place] [facility]        Assessment and Plan:   Kathryn [name] is a 26 year old female with a past psychiatric history significant for generalized anxiety disorder, attention deficit disorder (unspecified hyperactivity presents), and bipolar 1 disorder (depressed) who presents to [facility] via virtual telephone visit for follow-up and medication management.  Patient reports that she is doing well and has been taking her medications as prescribed.  She reports recently coming out of the depressive spell characterized by exhaustion and is doing much better.  Patient denies anxiety or any new stressors.  Patient to continue taking medications as prescribed.  Patient's  medications to be e-prescribed to pharmacy of choice.  Collaboration of Care: Collaboration of Care: Medication Management AEB provider managing patient's psychiatric medications, Primary Care Provider AEB patient being followed by primary care provider, and Psychiatrist AEB patient being followed by a mental health provider  Patient/Guardian was advised Release of Information must be obtained prior to any record release in order to collaborate their care with an outside provider. Patient/Guardian was advised if they have not already done so to contact the registration department to sign all necessary forms in order for [name] to release information regarding their care.   Consent: Patient/Guardian gives verbal consent for treatment and assignment of benefits for services provided during this visit. Patient/Guardian expressed understanding and agreed to proceed.   1. GAD (generalized anxiety disorder)  - venlafaxine XR (EFFEXOR-XR) 75 MG 24 hr capsule; TAKE ONE CAPSULE BY MOUTH DAILY  WITH BREAKFAST  Dispense: 30 capsule; Refill: 3  2. Bipolar 1 disorder, depressed (HCC)  - venlafaxine XR (EFFEXOR-XR) 75 MG 24 hr capsule; TAKE ONE CAPSULE BY MOUTH DAILY  WITH BREAKFAST  Dispense: 30 capsule; Refill: 3 - risperiDONE (RISPERDAL) 2 MG tablet; Take 1 tablet (2 mg total) by mouth at bedtime.  Dispense: 60 tablet; Refill: 3 - lamoTRIgine (LAMICTAL) 200 MG tablet; Take 1 tablet (200 mg total) by mouth at bedtime. Take one a day  Dispense: 30 tablet; Refill: 2  Patient to follow-up in 3 months Provider spent a total of 12 minutes with the patient/reviewing patient's chart  Kathryn Mood, PA 05/27/2021, 11:17 PM

## 2021-05-28 ENCOUNTER — Telehealth (HOSPITAL_COMMUNITY): Payer: Managed Care, Other (non HMO) | Admitting: Psychiatry

## 2021-05-28 ENCOUNTER — Telehealth (HOSPITAL_COMMUNITY): Payer: No Payment, Other | Admitting: Psychiatry

## 2021-06-02 ENCOUNTER — Telehealth (HOSPITAL_COMMUNITY): Payer: Self-pay | Admitting: *Deleted

## 2021-06-02 ENCOUNTER — Other Ambulatory Visit (HOSPITAL_COMMUNITY): Payer: Self-pay | Admitting: Physician Assistant

## 2021-06-02 DIAGNOSIS — F900 Attention-deficit hyperactivity disorder, predominantly inattentive type: Secondary | ICD-10-CM

## 2021-06-02 MED ORDER — AMPHETAMINE-DEXTROAMPHETAMINE 20 MG PO TABS: 20.0000 mg | ORAL_TABLET | Freq: Two times a day (BID) | ORAL | 0 refills | Status: DC

## 2021-06-02 NOTE — Telephone Encounter (Signed)
Provider was contacted by Eliezer Lofts, RMA regarding patient's Adderall refill request. Patient's medication to be e-prescribed to pharmacy of choice.

## 2021-06-02 NOTE — Progress Notes (Signed)
Provider was contacted by Eliezer Lofts, RMA regarding patient's Adderall refill request. Patient's medication to be e-prescribed to pharmacy of choice. ?

## 2021-06-02 NOTE — Telephone Encounter (Signed)
PATIENT CALLED REQUESTED REFILL amphetamine-dextroamphetamine (ADDERALL) 20 MG tablet ?

## 2021-06-04 ENCOUNTER — Ambulatory Visit (INDEPENDENT_AMBULATORY_CARE_PROVIDER_SITE_OTHER): Payer: BC Managed Care – PPO | Admitting: Obstetrics & Gynecology

## 2021-06-04 ENCOUNTER — Encounter: Payer: Self-pay | Admitting: Obstetrics & Gynecology

## 2021-06-04 ENCOUNTER — Other Ambulatory Visit (HOSPITAL_COMMUNITY)
Admission: RE | Admit: 2021-06-04 | Discharge: 2021-06-04 | Disposition: A | Discharge status: Home / Self Care | Payer: BC Managed Care – PPO | Source: Ambulatory Visit | Attending: Obstetrics & Gynecology | Admitting: Obstetrics & Gynecology

## 2021-06-04 ENCOUNTER — Encounter: Payer: Self-pay | Admitting: Family Medicine

## 2021-06-04 VITALS — BP 128/74 | HR 75 | Ht 68.5 in | Wt 144.0 lb

## 2021-06-04 DIAGNOSIS — B009 Herpesviral infection, unspecified: Secondary | ICD-10-CM

## 2021-06-04 DIAGNOSIS — Z8742 Personal history of other diseases of the female genital tract: Secondary | ICD-10-CM

## 2021-06-04 DIAGNOSIS — Z3009 Encounter for other general counseling and advice on contraception: Secondary | ICD-10-CM

## 2021-06-04 DIAGNOSIS — N946 Dysmenorrhea, unspecified: Secondary | ICD-10-CM

## 2021-06-04 DIAGNOSIS — Z01419 Encounter for gynecological examination (general) (routine) without abnormal findings: Secondary | ICD-10-CM | POA: Diagnosis not present

## 2021-06-04 DIAGNOSIS — N362 Urethral caruncle: Secondary | ICD-10-CM

## 2021-06-04 MED ORDER — VALACYCLOVIR HCL 1 G PO TABS: 1000.0000 mg | ORAL_TABLET | Freq: Every day | ORAL | 4 refills | Status: DC

## 2021-06-04 MED ORDER — LEVONORGEST-ETH ESTRAD 91-DAY 0.15-0.03 &0.01 MG PO TABS: 1.0000 | ORAL_TABLET | Freq: Every day | ORAL | 4 refills | Status: DC

## 2021-06-04 NOTE — Progress Notes (Signed)
Pt here for Annual- no concerns ?Pt requesting refill of Valtrex ?Last pap- 12/26/18- HSIL ?

## 2021-06-04 NOTE — Progress Notes (Signed)
Subjective:  ?  ? Kathryn [name] is a 26 y.o. female here for a routine exam.  Current complaints: none. Abstinent for over 1 year.  Lost insurance so, did not f/u for excisional procedure after abnormal PAP. Had small urethral cyst on initial visit.     ?  ?Gynecologic History ?Patient's last menstrual period was 04/30/2021 (approximate). ?Contraception: OCP (estrogen/progesterone) ?Last Pap: 12/26/2018. Results were: abnormal:  ?Satisfactory for evaluation; transformation zone component PRESENT.    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 ?A. ENDOCERVIX, CURETTAGE:  ?-  Atypical squamous epithelium  ?-  Benign endocervical glandular epithelium  ?-  See comment  ? ?B. 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 ?COMMENT:  ? ?A.  There is a small focus of atypical squamous epithelium.  The  ?cytology is worrisome for high-grade dysplasia.  ?Last mammogram: n/a  ? ?Obstetric History ?OB History  ?Gravida Para Term Preterm AB Living  ?0 0 0 0 0 0  ?SAB IAB Ectopic Multiple Live Births  ?0 0 0 0 0  ? ? ? ?The following portions of the patient's history were reviewed and updated as appropriate: allergies, current medications, past family history, past medical history, past social history, past surgical history, and problem list. ? ?Review of Systems ?Pertinent items are noted in HPI.  ?  ?Objective:  ?BP 128/74   Pulse 75   Ht 5' 8.5" (1.74 m)   Wt 144 lb (65.3 kg)   LMP 04/30/2021 (Approximate)   BMI 21.58 kg/m?  ? ?General Appearance:    Alert, cooperative, no distress, appears stated age  ?Head:    Normocephalic, without obvious abnormality, atraumatic  ?Eyes:    conjunctiva/corneas clear, EOM's intact, both eyes  ?Ears:    Normal external ear canals, both ears  ?Nose:   Nares normal, septum midline, mucosa normal, no drainage    or sinus tenderness  ?Throat:   Lips,  mucosa, and tongue normal; teeth and gums normal  ?Neck:   Supple, symmetrical, trachea midline, no adenopathy;  ?  thyroid:  no enlargement/tenderness/nodules  ?Back:     Symmetric, no curvature, ROM normal, no CVA tenderness  ?Lungs:     respirations unlabored  ?Chest Wall:    No tenderness or deformity  ? Heart:    Regular rate and rhythm  ?Breast Exam:    No tenderness, masses, or nipple abnormality  ?Abdomen:     Soft, non-tender, bowel sounds active all four quadrants,  ?  no masses, no organomegaly  ?Genitalia:    Normal female without lesion, discharge or tenderness  ? Large urethral polyp on narrow stalk. Attempted removal. Limited by pts discomfort.  The majority of the polyp was removed from its stalk.   ?Extremities:   Extremities normal, atraumatic, no cyanosis or edema  ?Pulses:   2+ and symmetric all extremities  ?Skin:   Skin color, texture, turgor normal, no rashes or lesions  ?  ? ?Assessment:  ? ? Healthy female exam.  ?  ?Plan:  ? ?Diagnoses and all orders for this visit: ? ?Well female exam with routine gynecological exam ?-     Cytology - PAP( [place]) ? ?HSV (herpes simplex virus) infection ?-     valACYclovir (VALTREX) 1000 MG tablet; Take 1 tablet (1,000 mg total) by mouth daily. ? ?History of abnormal cervical Papanicolaou smear ?-  Cancel: Cytology - PAP( [place]) ? ?Urethral polyp ? ?Encounter for other general counseling or advice on contraception ? ?Dysmenorrhea ?-     Levonorgestrel-Ethinyl Estradiol (AMETHIA) 0.15-0.03 &0.01 MG tablet; Take 1 tablet by mouth daily. ? ? F/u in 4 weeks or sooner prn  ? ?Lindsea Olivar L. Harraway-Smith, M.D., [place] ? ?

## 2021-06-09 LAB — CYTOLOGY - PAP
Comment: NEGATIVE
Diagnosis: NEGATIVE
Diagnosis: REACTIVE
High risk HPV: NEGATIVE

## 2021-06-18 ENCOUNTER — Encounter: Payer: Self-pay | Admitting: Family

## 2021-06-18 ENCOUNTER — Ambulatory Visit (INDEPENDENT_AMBULATORY_CARE_PROVIDER_SITE_OTHER): Payer: BC Managed Care – PPO | Admitting: Family

## 2021-06-18 VITALS — BP 130/70 | HR 84 | Temp 98.7°F | Ht 68.5 in | Wt 179.9 lb

## 2021-06-18 DIAGNOSIS — Z0000 Encounter for general adult medical examination without abnormal findings: Secondary | ICD-10-CM | POA: Diagnosis not present

## 2021-06-18 DIAGNOSIS — F319 Bipolar disorder, unspecified: Secondary | ICD-10-CM | POA: Diagnosis not present

## 2021-06-18 DIAGNOSIS — Z23 Encounter for immunization: Secondary | ICD-10-CM

## 2021-06-18 DIAGNOSIS — I10 Essential (primary) hypertension: Secondary | ICD-10-CM | POA: Diagnosis not present

## 2021-06-18 DIAGNOSIS — Z720 Tobacco use: Secondary | ICD-10-CM | POA: Diagnosis not present

## 2021-06-18 DIAGNOSIS — E785 Hyperlipidemia, unspecified: Secondary | ICD-10-CM | POA: Diagnosis not present

## 2021-06-18 LAB — COMPREHENSIVE METABOLIC PANEL
ALT: 20 U/L (ref 0–35)
AST: 20 U/L (ref 0–37)
Albumin: 4.5 g/dL (ref 3.5–5.2)
Alkaline Phosphatase: 59 U/L (ref 39–117)
BUN: 8 mg/dL (ref 6–23)
CO2: 27 mEq/L (ref 19–32)
Calcium: 9.4 mg/dL (ref 8.4–10.5)
Chloride: 103 mEq/L (ref 96–112)
Creatinine, Ser: 0.99 mg/dL (ref 0.40–1.20)
GFR: 78.74 mL/min (ref >60.00)
Glucose, Bld: 86 mg/dL (ref 70–99)
Potassium: 3.8 mEq/L (ref 3.5–5.1)
Sodium: 138 mEq/L (ref 135–145)
Total Bilirubin: 0.7 mg/dL (ref 0.2–1.2)
Total Protein: 6.9 g/dL (ref 6.0–8.3)

## 2021-06-18 LAB — LIPID PANEL
Cholesterol: 222 mg/dL — ABNORMAL HIGH (ref 0–200)
HDL: 56.9 mg/dL (ref >39.00)
LDL Cholesterol: 150 mg/dL — ABNORMAL HIGH (ref 0–99)
NonHDL: 164.89
Total CHOL/HDL Ratio: 4
Triglycerides: 72 mg/dL (ref 0.0–149.0)
VLDL: 14.4 mg/dL (ref 0.0–40.0)

## 2021-06-18 NOTE — Assessment & Plan Note (Signed)
Reports feeling well on current medications. She follows closely with psychiatry.  ?

## 2021-06-18 NOTE — Assessment & Plan Note (Signed)
She states previous hx of suicidal ideation on chantix.  Would avoid wellbutrin given her other meds being managed by psychiatry. I recommended that she purchase and begin the nicotine patch when she is ready to quit.  ?

## 2021-06-18 NOTE — Assessment & Plan Note (Signed)
BP Readings from Last 3 Encounters:  ?06/18/21 130/70  ?06/04/21 128/74  ?04/24/21 110/90  ? Not currently on anti-hypertensive. BP looks good. Monitor.  ?

## 2021-06-18 NOTE — Progress Notes (Signed)
? ?Subjective:  ? ?By signing my name below, I, Joaquin Music, attest that this documentation has been prepared under the direction and in the presence of Debbrah Alar, NP 06/18/2021  ? ? ? Patient ID: Kathryn [name], female    DOB: 09-05-1995, 26 y.o.   MRN: [id number] ? ?Chief Complaint  ?Patient presents with  ? Annual Exam  ? ? ?HPI ?Patient is in today for a comprehensive physical exam. ? ?Depression- She reports her mental health is doing better at this time. She is trying to find the motivation to perform certain tasks. ?Pap smear- Last checked on 06/04/2021. Results were normal. Repeat in 3-5 years. ?Immunizations- She is UTD on tetanus vaccines. She has one Gardasil vaccine at this time and will receive the second dose today. She has 1 Covid-19 vaccine at this time. ?Diet and Exercise- She does not exercise at this time because she recently broke her tailbone.  ?Social history- She drinks occasionally and uses marijuana sometimes. She smokes 1.50 packs a day and is willing to reduce.  ?Vision and Dental- UTD on vision and dental ? ?She denies having any unexpected weight change, ear pain, hearing loss and rhinorrhea, visual disturbance, cough, chest pain and leg swelling, nausea, vomiting, diarrhea and blood in stool, or dysuria and frequency, for myalgias, rash, headaches, adenopathy, depression or anxiety at this time  ? ?No recent surgeries. No changes to family medical history.  ? ?Past Medical History:  ?Diagnosis Date  ? Abnormal liver function tests 02/02/2016  ? ADHD 06/10/2016  ? Alcohol use 02/23/2016  ? Allergy   ? Anxiety   ? Arm fracture, right   ? as child, fx in three places, no surgery  ? Depression   ? Depression with anxiety 04-29-14  ? PTSD s/p death of best friend   ? Essential hypertension 02/02/2016  ? GERD (gastroesophageal reflux disease)   ? 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 Headache 04/09/2016  ? Hyperlipidemia, mixed 02/02/2016  ? Migraine   ? Ovarian cyst   ? PCOS (polycystic ovarian  syndrome)   ? Preventative health care 10/27/2015  ? Thrombocytosis 04/09/2016  ? Vaginal Pap smear, abnormal   ? HGSIL  ? ? ?Past Surgical History:  ?Procedure Laterality Date  ? ADENOIDECTOMY  2002  ? TYMPANOSTOMY TUBE PLACEMENT Bilateral 1999  ? ? ?Family History  ?Problem Relation Age of Onset  ? Hypertension Mother   ? Heart disease Mother   ?     CAD, stent at 73, s/p cardiac arrest with Defib  ? Hyperlipidemia Mother   ? Diabetes Mother   ?     s/p gastric bypass  ? Obesity Mother   ?     s/p gastric bypass  ? OCD Mother   ? Hyperlipidemia Father   ? Hypertension Father   ? Anxiety disorder Father   ? Asthma Sister   ? Hyperlipidemia Brother   ? Anxiety disorder Brother   ? Stroke Maternal Grandmother   ? Thyroid disease Maternal Grandmother   ? Anxiety disorder Maternal Grandmother   ? Heart disease Maternal Grandfather   ?     MI first at 19, s/p stents and CABG  ? Hyperlipidemia Maternal Grandfather   ? Hypertension Maternal Grandfather   ? Stroke Maternal Grandfather   ? Cancer Maternal Grandfather   ?     melanoma  ? Aneurysm Maternal Grandfather   ?     brain  ? Kidney disease Maternal Grandfather   ?  tumor  ? Heart disease Paternal Grandmother   ? Cancer Paternal Grandfather   ?     ung and brain  ? Heart disease Maternal Uncle   ? Vision loss Maternal Uncle   ? Anxiety disorder Maternal Uncle   ? Anxiety disorder Maternal Aunt   ? Depression Maternal Aunt   ? ? ?Social History  ? ?Socioeconomic History  ? Marital status: Single  ?  Spouse name: Not on file  ? Number of children: 0  ? Years of education: 22  ? Highest education level: Not on file  ?Occupational History  ?  Comment: [employer]  ?Tobacco Use  ? Smoking status: Heavy Smoker  ?  Packs/day: 1.50  ?  Types: Cigarettes  ? Smokeless tobacco: Former  ?  Types: Snuff  ?Vaping Use  ? Vaping Use: Never used  ?Substance and Sexual Activity  ? Alcohol use: No  ?  Alcohol/week: 0.0 standard drinks  ?  Comment: maybe every 6 months  ? Drug use:  Yes  ?  Types: Marijuana  ?  Comment: daily  ? Sexual activity: Yes  ?  Partners: Male  ?  Birth control/protection: Pill  ?Other Topics Concern  ? Not on file  ?Social History Narrative  ? Not on file  ? ?Social Determinants of Health  ? ?Financial Resource Strain: Not on file  ?Food Insecurity: Not on file  ?Transportation Needs: Not on file  ?Physical Activity: Not on file  ?Stress: Not on file  ?Social Connections: Not on file  ?Intimate Partner Violence: Not on file  ? ? ?Outpatient Medications Prior to Visit  ?Medication Sig Dispense Refill  ? ferrous sulfate 325 (65 FE) MG tablet Take 325 mg by mouth daily with breakfast.    ? vitamin B-12 (CYANOCOBALAMIN) 500 MCG tablet Take 500 mcg by mouth daily.    ? amphetamine-dextroamphetamine (ADDERALL) 20 MG tablet Take 1 tablet (20 mg total) by mouth 2 (two) times daily. 60 tablet 0  ? HYDROcodone-acetaminophen (NORCO) 5-325 MG tablet Take 1 tablet by mouth every 6 (six) hours as needed for moderate pain. 30 tablet 0  ? lamoTRIgine (LAMICTAL) 200 MG tablet Take 1 tablet (200 mg total) by mouth at bedtime. Take one a day 30 tablet 2  ? Levonorgestrel-Ethinyl Estradiol (AMETHIA) 0.15-0.03 &0.01 MG tablet Take 1 tablet by mouth daily. 91 tablet 4  ? omeprazole (PRILOSEC) 40 MG capsule Take 1 capsule (40 mg total) by mouth 2 (two) times daily. 180 capsule 3  ? risperiDONE (RISPERDAL) 2 MG tablet Take 1 tablet (2 mg total) by mouth at bedtime. 60 tablet 3  ? valACYclovir (VALTREX) 1000 MG tablet Take 1 tablet (1,000 mg total) by mouth daily. 90 tablet 4  ? venlafaxine XR (EFFEXOR-XR) 75 MG 24 hr capsule TAKE ONE CAPSULE BY MOUTH DAILY  WITH BREAKFAST 30 capsule 3  ? ?No facility-administered medications prior to visit.  ? ? ?Allergies  ?Allergen Reactions  ? Oxycodone Nausea Only, Nausea And Vomiting and Other (See Comments)  ?  Oxycontin also ?Oxycontin also ?Oxycontin also ?Oxycontin also ?  ? Quetiapine Fumarate Other (See Comments)  ?  Mental status changes ?Mental  status changes ?Mental status changes ?Mental status changes ?  ? ? ?Review of Systems  ?Constitutional:  Negative for fever.  ?HENT:  Negative for ear pain and hearing loss.   ?     (-)nystagmus ?(-)adenopathy  ?Eyes:  Negative for blurred vision.  ?Respiratory:  Negative for cough, shortness of breath  and wheezing.   ?Cardiovascular:  Negative for chest pain and leg swelling.  ?Gastrointestinal:  Negative for blood in stool, diarrhea, nausea and vomiting.  ?Genitourinary:  Negative for dysuria and frequency.  ?Musculoskeletal:  Positive for joint pain (ankle). Negative for myalgias.  ?Skin:  Negative for rash.  ?Neurological:  Negative for headaches.  ?Psychiatric/Behavioral:  Negative for depression. The patient is not nervous/anxious.   ? ?   ?Objective:  ?  ?Physical Exam ?Constitutional:   ?   General: She is not in acute distress. ?   Appearance: Normal appearance. She is not ill-appearing.  ?HENT:  ?   Head: Normocephalic and atraumatic.  ?   Right Ear: Tympanic membrane, ear canal and external ear normal.  ?   Left Ear: Tympanic membrane, ear canal and external ear normal.  ?Eyes:  ?   Extraocular Movements: Extraocular movements intact.  ?   Right eye: No nystagmus.  ?   Left eye: No nystagmus.  ?   Pupils: Pupils are equal, round, and reactive to light.  ?Cardiovascular:  ?   Rate and Rhythm: Normal rate and regular rhythm.  ?   Pulses: Normal pulses.  ?   Heart sounds: Normal heart sounds. No murmur heard. ?Pulmonary:  ?   Effort: Pulmonary effort is normal. No respiratory distress.  ?   Breath sounds: Normal breath sounds. No wheezing or rhonchi.  ?Abdominal:  ?   General: Bowel sounds are normal. There is no distension.  ?   Palpations: Abdomen is soft.  ?   Tenderness: There is no abdominal tenderness. There is no guarding or rebound.  ?Musculoskeletal:  ?   Cervical back: Neck supple.  ?Lymphadenopathy:  ?   Cervical: No cervical adenopathy.  ?Skin: ?   General: Skin is warm and dry.  ?Neurological:   ?   Mental Status: She is alert and oriented to person, place, and time.  ?   Deep Tendon Reflexes:  ?   Reflex Scores: ?     Patellar reflexes are 2+ on the right side and 2+ on the left side. ?Psychia

## 2021-06-18 NOTE — Assessment & Plan Note (Signed)
Pap up to date- managed by GYN. Recommended that she get a covid Booster at her pharmacy. Gardisil #2 given today.  ?

## 2021-06-18 NOTE — Patient Instructions (Signed)
Continue your work on healthy diet and regular exercise. ?Please quit smoking. ? ?

## 2021-06-24 ENCOUNTER — Ambulatory Visit: Payer: Self-pay | Admitting: Family Medicine

## 2021-06-24 NOTE — Progress Notes (Incomplete)
? ?Subjective:  ? ?By signing my name below, I, Kathryn [name], attest that this documentation has been prepared under the direction and in the presence of Mosie Lukes, MD. 06/24/2021  ? ? ? Patient ID: Kathryn [name], female    DOB: 1995-11-26, 26 y.o.   MRN: [id number] ? ?No chief complaint on file. ? ? ?HPI ?Patient is in today for an office visit ? ?Past Medical History:  ?Diagnosis Date  ? Abnormal liver function tests 02/02/2016  ? ADHD 06/10/2016  ? Alcohol use 02/23/2016  ? Allergy   ? Anxiety   ? Arm fracture, right   ? as child, fx in three places, no surgery  ? Depression   ? Depression with anxiety 03-May-2014  ? PTSD s/p death of best friend   ? Essential hypertension 02/02/2016  ? GERD (gastroesophageal reflux disease)   ? 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 Headache 04/09/2016  ? Hyperlipidemia, mixed 02/02/2016  ? Migraine   ? Ovarian cyst   ? PCOS (polycystic ovarian syndrome)   ? Preventative health care 10/27/2015  ? Thrombocytosis 04/09/2016  ? Vaginal Pap smear, abnormal   ? HGSIL  ? ? ?Past Surgical History:  ?Procedure Laterality Date  ? ADENOIDECTOMY  2002  ? TYMPANOSTOMY TUBE PLACEMENT Bilateral 1999  ? ? ?Family History  ?Problem Relation Age of Onset  ? Hypertension Mother   ? Heart disease Mother   ?     CAD, stent at 14, s/p cardiac arrest with Defib  ? Hyperlipidemia Mother   ? Diabetes Mother   ?     s/p gastric bypass  ? Obesity Mother   ?     s/p gastric bypass  ? OCD Mother   ? Hyperlipidemia Father   ? Hypertension Father   ? Anxiety disorder Father   ? Asthma Sister   ? Hyperlipidemia Brother   ? Anxiety disorder Brother   ? Stroke Maternal Grandmother   ? Thyroid disease Maternal Grandmother   ? Anxiety disorder Maternal Grandmother   ? Heart disease Maternal Grandfather   ?     MI first at 66, s/p stents and CABG  ? Hyperlipidemia Maternal Grandfather   ? Hypertension Maternal Grandfather   ? Stroke Maternal Grandfather   ? Cancer Maternal Grandfather   ?     melanoma  ? Aneurysm Maternal  Grandfather   ?     brain  ? Kidney disease Maternal Grandfather   ?     tumor  ? Heart disease Paternal Grandmother   ? Cancer Paternal Grandfather   ?     ung and brain  ? Heart disease Maternal Uncle   ? Vision loss Maternal Uncle   ? Anxiety disorder Maternal Uncle   ? Anxiety disorder Maternal Aunt   ? Depression Maternal Aunt   ? ? ?Social History  ? ?Socioeconomic History  ? Marital status: Single  ?  Spouse name: Not on file  ? Number of children: 0  ? Years of education: 45  ? Highest education level: Not on file  ?Occupational History  ?  Comment: [employer]  ?Tobacco Use  ? Smoking status: Heavy Smoker  ?  Packs/day: 1.50  ?  Types: Cigarettes  ? Smokeless tobacco: Former  ?  Types: Snuff  ?Vaping Use  ? Vaping Use: Never used  ?Substance and Sexual Activity  ? Alcohol use: No  ?  Alcohol/week: 0.0 standard drinks  ?  Comment: maybe every 6 months  ? Drug  use: Yes  ?  Types: Marijuana  ?  Comment: daily  ? Sexual activity: Yes  ?  Partners: Male  ?  Birth control/protection: Pill  ?Other Topics Concern  ? Not on file  ?Social History Narrative  ? Not on file  ? ?Social Determinants of Health  ? ?Financial Resource Strain: Not on file  ?Food Insecurity: Not on file  ?Transportation Needs: Not on file  ?Physical Activity: Not on file  ?Stress: Not on file  ?Social Connections: Not on file  ?Intimate Partner Violence: Not on file  ? ? ?Outpatient Medications Prior to Visit  ?Medication Sig Dispense Refill  ? amphetamine-dextroamphetamine (ADDERALL) 20 MG tablet Take 1 tablet (20 mg total) by mouth 2 (two) times daily. 60 tablet 0  ? ferrous sulfate 325 (65 FE) MG tablet Take 325 mg by mouth daily with breakfast.    ? HYDROcodone-acetaminophen (NORCO) 5-325 MG tablet Take 1 tablet by mouth every 6 (six) hours as needed for moderate pain. 30 tablet 0  ? lamoTRIgine (LAMICTAL) 200 MG tablet Take 1 tablet (200 mg total) by mouth at bedtime. Take one a day 30 tablet 2  ? Levonorgestrel-Ethinyl Estradiol  (AMETHIA) 0.15-0.03 &0.01 MG tablet Take 1 tablet by mouth daily. 91 tablet 4  ? omeprazole (PRILOSEC) 40 MG capsule Take 1 capsule (40 mg total) by mouth 2 (two) times daily. 180 capsule 3  ? risperiDONE (RISPERDAL) 2 MG tablet Take 1 tablet (2 mg total) by mouth at bedtime. 60 tablet 3  ? valACYclovir (VALTREX) 1000 MG tablet Take 1 tablet (1,000 mg total) by mouth daily. 90 tablet 4  ? venlafaxine XR (EFFEXOR-XR) 75 MG 24 hr capsule TAKE ONE CAPSULE BY MOUTH DAILY  WITH BREAKFAST 30 capsule 3  ? vitamin B-12 (CYANOCOBALAMIN) 500 MCG tablet Take 500 mcg by mouth daily.    ? ?No facility-administered medications prior to visit.  ? ? ?Allergies  ?Allergen Reactions  ? Oxycodone Nausea Only, Nausea And Vomiting and Other (See Comments)  ?  Oxycontin also ?Oxycontin also ?Oxycontin also ?Oxycontin also ?  ? Quetiapine Fumarate Other (See Comments)  ?  Mental status changes ?Mental status changes ?Mental status changes ?Mental status changes ?  ? ? ?Review of Systems  ?Constitutional:  Negative for fever and malaise/fatigue.  ?HENT:  Negative for congestion.   ?Eyes:  Negative for redness.  ?Respiratory:  Negative for shortness of breath.   ?Cardiovascular:  Negative for chest pain, palpitations and leg swelling.  ?Gastrointestinal:  Negative for abdominal pain, blood in stool and nausea.  ?Genitourinary:  Negative for dysuria and frequency.  ?Musculoskeletal:  Negative for falls.  ?Skin:  Negative for rash.  ?Neurological:  Negative for dizziness, loss of consciousness and headaches.  ?Endo/Heme/Allergies:  Negative for polydipsia.  ?Psychiatric/Behavioral:  Negative for depression. The patient is not nervous/anxious.   ? ?   ?Objective:  ?  ?Physical Exam ?Constitutional:   ?   General: She is not in acute distress. ?   Appearance: She is well-developed.  ?HENT:  ?   Head: Normocephalic and atraumatic.  ?Eyes:  ?   Conjunctiva/sclera: Conjunctivae normal.  ?Neck:  ?   Thyroid: No thyromegaly.  ?Cardiovascular:  ?    Rate and Rhythm: Normal rate and regular rhythm.  ?   Heart sounds: Normal heart sounds. No murmur heard. ?Pulmonary:  ?   Effort: Pulmonary effort is normal. No respiratory distress.  ?   Breath sounds: Normal breath sounds.  ?Abdominal:  ?  General: Bowel sounds are normal. There is no distension.  ?   Palpations: Abdomen is soft. There is no mass.  ?   Tenderness: There is no abdominal tenderness.  ?Musculoskeletal:  ?   Cervical back: Neck supple.  ?Lymphadenopathy:  ?   Cervical: No cervical adenopathy.  ?Skin: ?   General: Skin is warm and dry.  ?Neurological:  ?   Mental Status: She is alert and oriented to person, place, and time.  ?Psychiatric:     ?   Behavior: Behavior normal.  ? ? ?There were no vitals taken for this visit. ?Wt Readings from Last 3 Encounters:  ?06/18/21 179 lb 14.4 oz (81.6 kg)  ?06/04/21 144 lb (65.3 kg)  ?12/13/19 161 lb (73 kg)  ? ? ?Diabetic Foot Exam - Simple   ?No data filed ?  ? ?Lab Results  ?Component Value Date  ? WBC 9.5 05/22/2019  ? HGB 14.4 05/22/2019  ? HCT 42.8 05/22/2019  ? PLT 388 05/22/2019  ? GLUCOSE 86 06/18/2021  ? CHOL 222 (H) 06/18/2021  ? TRIG 72.0 06/18/2021  ? HDL 56.90 06/18/2021  ? LDLDIRECT 138.0 01/22/2016  ? LDLCALC 150 (H) 06/18/2021  ? ALT 20 06/18/2021  ? AST 20 06/18/2021  ? NA 138 06/18/2021  ? K 3.8 06/18/2021  ? CL 103 06/18/2021  ? CREATININE 0.99 06/18/2021  ? BUN 8 06/18/2021  ? CO2 27 06/18/2021  ? TSH 0.735 11/17/2018  ? HGBA1C 5.2 11/17/2018  ? ? ?Lab Results  ?Component Value Date  ? TSH 0.735 11/17/2018  ? ?Lab Results  ?Component Value Date  ? WBC 9.5 05/22/2019  ? HGB 14.4 05/22/2019  ? HCT 42.8 05/22/2019  ? MCV 99.1 05/22/2019  ? PLT 388 05/22/2019  ? ?Lab Results  ?Component Value Date  ? NA 138 06/18/2021  ? K 3.8 06/18/2021  ? CO2 27 06/18/2021  ? GLUCOSE 86 06/18/2021  ? BUN 8 06/18/2021  ? CREATININE 0.99 06/18/2021  ? BILITOT 0.7 06/18/2021  ? ALKPHOS 59 06/18/2021  ? AST 20 06/18/2021  ? ALT 20 06/18/2021  ? PROT 6.9 06/18/2021   ? ALBUMIN 4.5 06/18/2021  ? CALCIUM 9.4 06/18/2021  ? ANIONGAP 10 05/22/2019  ? GFR 78.74 06/18/2021  ? ?Lab Results  ?Component Value Date  ? CHOL 222 (H) 06/18/2021  ? ?Lab Results  ?Component Value Date  ? HDL

## 2021-07-02 ENCOUNTER — Ambulatory Visit: Payer: BC Managed Care – PPO | Admitting: Obstetrics & Gynecology

## 2021-07-03 ENCOUNTER — Telehealth (HOSPITAL_COMMUNITY): Payer: Self-pay | Admitting: *Deleted

## 2021-07-03 ENCOUNTER — Other Ambulatory Visit (HOSPITAL_COMMUNITY): Payer: Self-pay | Admitting: Physician Assistant

## 2021-07-03 DIAGNOSIS — F900 Attention-deficit hyperactivity disorder, predominantly inattentive type: Secondary | ICD-10-CM

## 2021-07-03 MED ORDER — AMPHETAMINE-DEXTROAMPHETAMINE 20 MG PO TABS: 20.0000 mg | ORAL_TABLET | Freq: Two times a day (BID) | ORAL | 0 refills | Status: DC

## 2021-07-03 NOTE — Telephone Encounter (Signed)
VM for writer requesting new rx be send to her preferred pharmacy for her adderall. She has a future appt with Ludwig Clarks PA as she had conflict with the NP that was covering for [facility] while she was out on leave. She should be out of adderall, last written on 4/17. Will forward request to the provider.

## 2021-07-03 NOTE — Telephone Encounter (Signed)
Provider was contacted by Glory Buff. Olevia Bowens, RN regarding patient's medication refill request. Patient's medication to be e-prescribed to pharmacy of choice.

## 2021-07-03 NOTE — Progress Notes (Signed)
Provider was contacted by Glory Buff. Olevia Bowens, RN regarding patient's medication refill request.  Patient is requesting that her Adderall be sent to her pharmacy of choice.  Patient's medication to be e-prescribed to pharmacy of choice.

## 2021-07-04 ENCOUNTER — Telehealth (HOSPITAL_COMMUNITY): Payer: Self-pay | Admitting: *Deleted

## 2021-07-04 ENCOUNTER — Other Ambulatory Visit (HOSPITAL_COMMUNITY): Payer: Self-pay | Admitting: Physician Assistant

## 2021-07-04 DIAGNOSIS — F900 Attention-deficit hyperactivity disorder, predominantly inattentive type: Secondary | ICD-10-CM

## 2021-07-04 MED ORDER — AMPHETAMINE-DEXTROAMPHETAMINE 10 MG PO TABS: 20.0000 mg | ORAL_TABLET | Freq: Two times a day (BID) | ORAL | 0 refills | Status: DC

## 2021-07-04 NOTE — Progress Notes (Signed)
Provider was contacted by Glory Buff. Olevia Bowens, RN and notified that patient's pharmacy has run out of Adderall 20 mg tablets but having plenty of 10 mg tablets available.  Provider to fill patient's prescriptions using Adderall 10 mg tablets.  Patient's medication to be prescribed to pharmacy of choice.

## 2021-07-04 NOTE — Telephone Encounter (Signed)
Called back to check to see if her adderall rx had been sent into her pharmacy It had been sent in yesterday. She called me back after she checked with her pharmacy because the pharmacy had not notified her it was there and said they do not have 20 mg but have plenty of 10 mg if Eddie PA could change her rx to 10 mg taking 2 of them a day. Will notify Ludwig Clarks and ask him to make this change.

## 2021-07-04 NOTE — Telephone Encounter (Signed)
Provider was contacted by Glory Buff. Olevia Bowens, RN and notified that patient's pharmacy has run out of Adderall 20 mg tablets but having plenty of 10 mg tablets available.  Provider to fill patient's prescriptions using Adderall 10 mg tablets.  Patient's medication to be prescribed to pharmacy of choice.

## 2021-07-31 ENCOUNTER — Telehealth (HOSPITAL_COMMUNITY): Payer: Self-pay | Admitting: *Deleted

## 2021-07-31 NOTE — Telephone Encounter (Signed)
PATIENT CALLED LVM REQUESTED REFILLS ON amphetamine-dextroamphetamine (ADDERALL)

## 2021-08-01 NOTE — Telephone Encounter (Signed)
Patient's medication was e-prescribed to pharmacy of choice.

## 2021-08-26 ENCOUNTER — Telehealth (INDEPENDENT_AMBULATORY_CARE_PROVIDER_SITE_OTHER): Payer: No Payment, Other | Admitting: Student in an Organized Health Care Education/Training Program

## 2021-08-26 DIAGNOSIS — F319 Bipolar disorder, unspecified: Secondary | ICD-10-CM | POA: Diagnosis not present

## 2021-08-26 DIAGNOSIS — F411 Generalized anxiety disorder: Secondary | ICD-10-CM | POA: Diagnosis not present

## 2021-08-26 MED ORDER — RISPERIDONE 1 MG PO TABS: 1.0000 mg | ORAL_TABLET | Freq: Every day | ORAL | 0 refills | Status: DC

## 2021-08-26 MED ORDER — LAMOTRIGINE 200 MG PO TABS: 200.0000 mg | ORAL_TABLET | Freq: Every day | ORAL | 2 refills | Status: DC

## 2021-08-26 MED ORDER — VENLAFAXINE HCL ER 75 MG PO CP24: ORAL_CAPSULE | ORAL | 3 refills | Status: DC

## 2021-08-26 NOTE — Progress Notes (Signed)
Virtual Visit via Telephone Note  I connected with [name] on 08/26/21 at  3:30 PM EDT by telephone and verified that I am speaking with the correct person using two identifiers.  Location: Patient: Home Provider: [place]   I discussed the limitations, risks, security and privacy concerns of performing an evaluation and management service by telephone and the availability of in person appointments. I also discussed with the patient that there may be a patient responsible charge related to this service. The patient expressed understanding and agreed to proceed.   History of Present Illness: Kathryn [name] is a 26 year old female with a past psychiatric history significant for generalized anxiety disorder, attention deficit disorder (unspecified hyperactivity presents), and bipolar 1 disorder (depressed) who presents to [facility] via virtual telephone visit for follow-up and medication management.  She reports that she has been doing well without any issues.  She states she feels like her medications may be a bit much because she feels kind of "meh."  She states she feels like she is in a better place in life.  When asked to describe this she states that she is now holding down a full-time job.  She states she is no longer around people doing drugs.  She states that she is learned how to manage her money better.  She states that all of these things combined have put her in a much more stable place and so would like to discuss decreasing specifically her Risperdal.  She states that she has had some weight gain due to it and since this had been the last 1 added she would like to decrease this.  Discussed we could trial decreasing it to 1 mg with a follow-up appointment in 4 weeks to reassess.  Discussed if she notices any mood instability or issues she needs to call him back and begin taking 2 mg at night again.  She reported understanding and had no concerns with  this.  She reports she has noticed her Adderall and not being as effective as it has been in the past.  She states she still feels it being effective but does not seem to be lasting as long.  She states she has been on Wellbutrin and Strattera in the past without effect.  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  Discussed medication holidays with her.  Discussed that this entails not taking the Adderall on the days she is not working or needing to study as the body can build up tolerance to the medication if it is taken every day.  She states she is willing to trial this.  She reports no SI, HI, or AVH.  She reports her sleep is doing good.  She reports her appetite is doing good.  She reports no other concerns at present.   Observations/Objective:   Psychiatric Specialty Exam:   Review of Systems  Respiratory:  Negative for shortness of breath.   Cardiovascular:  Negative for chest pain.  Gastrointestinal:  Negative for abdominal pain, constipation, diarrhea, nausea and vomiting.  Neurological:  Negative for dizziness, weakness and headaches.  Psychiatric/Behavioral:  Positive for decreased concentration. Negative for dysphoric mood, self-injury, sleep disturbance and suicidal ideas. The patient is not nervous/anxious.     There were no vitals taken for this visit.There is no height or weight on file to calculate BMI.  General Appearance: Could not assess  Eye Contact:  Could  not assess  Speech:  Clear and Coherent and Normal Rate  Volume:  Normal  Mood:  Euthymic  Affect:  Appropriate and Congruent  Thought Process:  Coherent and Goal Directed  Orientation:  Full (Time, Place, and Person)  Thought Content:  Logical  Suicidal Thoughts:  No  Homicidal Thoughts:  No  Memory:  Immediate;   Good Recent;   Good  Judgement:  Fair  Insight:  Fair  Psychomotor Activity:  Normal  Concentration:  Concentration: Good and  Attention Span: Good  Recall:  Good  Fund of Knowledge:  Good  Language:  Good  Akathisia:  Could not assess  Handed:  Right  AIMS (if indicated):     Assets:  Communication Skills Desire for Improvement Financial Resources/Insurance Housing Resilience Social Support Vocational/Educational  ADL's:  Intact  Cognition:  WNL  Sleep:   fair    Assessment and Plan:  Kathryn [name] is a 26 year old female with a past psychiatric history significant for generalized anxiety disorder, attention deficit disorder (unspecified hyperactivity presents), and bipolar 1 disorder (depressed) who presents to [facility] via virtual telephone visit for follow-up and medication management.   Mae has been doing good since her last appointment.  She reports that she feels like her medications are a little too much.  She reports that since she is in a more stable place she feels like her medication could be decreased.  We will decrease her Risperdal and then have a follow up appointment in 4 weeks to reassess.  She will trial medication holidays with her Adderall to see if this improves her response to it.   1. GAD (generalized anxiety disorder)   - venlafaxine XR (EFFEXOR-XR) 75 MG 24 hr capsule; TAKE ONE CAPSULE BY MOUTH DAILY  WITH BREAKFAST  Dispense: 30 capsule; Refill: 3    2. Bipolar 1 disorder, depressed (HCC)   - venlafaxine XR (EFFEXOR-XR) 75 MG 24 hr capsule; TAKE ONE CAPSULE BY MOUTH DAILY  WITH BREAKFAST  Dispense: 30 capsule; Refill: 3 -Decrease risperiDONE (RISPERDAL) to 1 MG tablet; Take 1 tablet (1 mg total) by mouth at bedtime.  Dispense: 30 tablet; Refill: 0 - lamoTRIgine (LAMICTAL) 200 MG tablet; Take 1 tablet (200 mg total) by mouth at bedtime. Take one a day  Dispense: 30 tablet; Refill: 2   3. ADHD  -Adderall 20 mg (10 mg tablets) BID.  120 tablets with 0 refills.    Follow Up Instructions:    I discussed the assessment and  treatment plan with the patient. The patient was provided an opportunity to ask questions and all were answered. The patient agreed with the plan and demonstrated an understanding of the instructions.   The patient was advised to call back or seek an in-person evaluation if the symptoms worsen or if the condition fails to improve as anticipated.  I provided 10 minutes of non-face-to-face time during this encounter.   Briant Cedar, MD

## 2021-08-27 ENCOUNTER — Other Ambulatory Visit (HOSPITAL_COMMUNITY): Payer: Self-pay | Admitting: Psychiatry

## 2021-08-27 DIAGNOSIS — F900 Attention-deficit hyperactivity disorder, predominantly inattentive type: Secondary | ICD-10-CM

## 2021-08-27 MED ORDER — AMPHETAMINE-DEXTROAMPHETAMINE 10 MG PO TABS: 20.0000 mg | ORAL_TABLET | Freq: Two times a day (BID) | ORAL | 0 refills | Status: DC

## 2021-08-27 NOTE — Telephone Encounter (Signed)
Refill for Adderall 10 mg take 2 tablets twice daily, total 120 pills was sent to the pharmacy, [facility], [facility]

## 2021-09-23 ENCOUNTER — Other Ambulatory Visit (HOSPITAL_COMMUNITY): Payer: Self-pay | Admitting: Student in an Organized Health Care Education/Training Program

## 2021-09-23 DIAGNOSIS — F319 Bipolar disorder, unspecified: Secondary | ICD-10-CM

## 2021-09-23 MED ORDER — RISPERIDONE 1 MG PO TABS: 1.0000 mg | ORAL_TABLET | Freq: Every day | ORAL | 1 refills | Status: DC

## 2021-09-23 NOTE — Progress Notes (Signed)
Received request for refills of patient's Risperdal.  This was sent.   Sent: -Risperdal 1 mg QHS.  30 tablets with 1 refill.    Fatima Sanger MD Resident

## 2021-09-30 ENCOUNTER — Telehealth (HOSPITAL_COMMUNITY): Payer: No Payment, Other | Admitting: Student in an Organized Health Care Education/Training Program

## 2021-09-30 ENCOUNTER — Telehealth (HOSPITAL_COMMUNITY): Payer: Self-pay | Admitting: Student in an Organized Health Care Education/Training Program

## 2021-09-30 ENCOUNTER — Other Ambulatory Visit (HOSPITAL_COMMUNITY): Payer: Self-pay | Admitting: Physician Assistant

## 2021-09-30 DIAGNOSIS — F900 Attention-deficit hyperactivity disorder, predominantly inattentive type: Secondary | ICD-10-CM

## 2021-09-30 DIAGNOSIS — F319 Bipolar disorder, unspecified: Secondary | ICD-10-CM

## 2021-09-30 MED ORDER — RISPERIDONE 1 MG PO TABS: 1.0000 mg | ORAL_TABLET | Freq: Every day | ORAL | 1 refills | Status: DC

## 2021-09-30 MED ORDER — AMPHETAMINE-DEXTROAMPHETAMINE 10 MG PO TABS: 20.0000 mg | ORAL_TABLET | Freq: Two times a day (BID) | ORAL | 0 refills | Status: DC

## 2021-09-30 NOTE — Telephone Encounter (Signed)
Patient's medications were  e-prescribed to pharmacy of choice.

## 2021-09-30 NOTE — Progress Notes (Signed)
Provider was contacted by Nicola Girt regarding patient's request for medications to be filled.  Patient's medications to be e-prescribed to pharmacy of choice.

## 2021-10-14 ENCOUNTER — Telehealth (INDEPENDENT_AMBULATORY_CARE_PROVIDER_SITE_OTHER): Payer: No Payment, Other | Admitting: Student in an Organized Health Care Education/Training Program

## 2021-10-14 DIAGNOSIS — F411 Generalized anxiety disorder: Secondary | ICD-10-CM

## 2021-10-14 DIAGNOSIS — F319 Bipolar disorder, unspecified: Secondary | ICD-10-CM

## 2021-10-14 DIAGNOSIS — F900 Attention-deficit hyperactivity disorder, predominantly inattentive type: Secondary | ICD-10-CM

## 2021-10-14 DIAGNOSIS — F3130 Bipolar disorder, current episode depressed, mild or moderate severity, unspecified: Secondary | ICD-10-CM | POA: Diagnosis not present

## 2021-10-14 NOTE — Progress Notes (Signed)
Virtual Visit via Video Note  I connected with Kathryn [name] on 10/14/21 at  3:00 PM EDT by a video enabled telemedicine application and verified that I am speaking with the correct person using two identifiers.  Location: Patient: work Provider: [employer]   I discussed the limitations of evaluation and management by telemedicine and the availability of in person appointments. The patient expressed understanding and agreed to proceed.  History of Present Illness:  Kathryn [name] is a 26 year old female with a past psychiatric history significant for generalized anxiety disorder, attention deficit disorder (unspecified hyperactivity presents), and bipolar 1 disorder (depressed) who presents to [facility] via virtual telephone visit for follow-up and medication management.  She reports that she has been doing good since our last appointment.  She reports she has tolerated the reduction in her Risperdal well without any issues.  She reports no mood instability or anger issues.  She reports no side effects from her medications.  She reports she is currently a little more stressed than normal because she has an upcoming test and so her sleep is a little worse than normal.  She asked if she would be able to stop her Risperdal given she tolerated the reduction.   Discussed we could trial stopping the Risperdal.  She reports no SI, HI, or AVH.  She reports her appetite is doing good.  She reports her sleep is fair.  She reports no other concerns at present.  She will return in approximately 4 weeks for follow up.    Observations/Objective:  Psychiatric Specialty Exam:   Review of Systems  Respiratory:  Negative for shortness of breath.   Cardiovascular:  Negative for chest pain.  Gastrointestinal:  Negative for abdominal pain, constipation, diarrhea, nausea and vomiting.  Neurological:  Negative for dizziness, weakness and headaches.   Psychiatric/Behavioral:  Negative for agitation, dysphoric mood, hallucinations, self-injury, sleep disturbance and suicidal ideas. The patient is nervous/anxious (mild situational).     There were no vitals taken for this visit.There is no height or weight on file to calculate BMI.  General Appearance: Casual and Fairly Groomed  Eye Contact:  Good  Speech:  Clear and Coherent and Normal Rate  Volume:  Normal  Mood:  Euthymic  Affect:  Appropriate and Congruent  Thought Process:  Coherent and Goal Directed  Orientation:  Full (Time, Place, and Person)  Thought Content:  Logical  Suicidal Thoughts:  No  Homicidal Thoughts:  No  Memory:  Immediate;   Good Recent;   Good  Judgement:  Good  Insight:  Good  Psychomotor Activity:  Normal  Concentration:  Concentration: Good and Attention Span: Good  Recall:  Good  Fund of Knowledge:  Good  Language:  Good  Akathisia:  Negative  Handed:  Right  AIMS (if indicated):     Assets:  Communication Skills Desire for Improvement Financial Resources/Insurance Housing Resilience Social Support Vocational/Educational  ADL's:  Intact  Cognition:  WNL  Sleep:   fair     Assessment and Plan:  Kathryn [name] is a 26 year old female with a past psychiatric history significant for generalized anxiety disorder, attention deficit disorder (unspecified hyperactivity presents), and bipolar 1 disorder (depressed) who presents to [facility] via virtual telephone visit for follow-up and medication management.   Kathryn [name] has continued to do well since her last appointment with the decrease in the Risperdal.  Her mood has remained stable and she has not had any issues with anger  or irritability.  Given this we will stop the Risperdal at this time.  We will not make any other medication changes at this time.  She will return for follow-up in 1 month to monitor for changes with stopping the Risperdal.   1. GAD  (generalized anxiety disorder)   - venlafaxine XR (EFFEXOR-XR) 75 MG 24 hr capsule; TAKE ONE CAPSULE BY MOUTH DAILY  WITH BREAKFAST  No refill sent at this time.     2. Bipolar 1 disorder, depressed (HCC)   - venlafaxine XR (EFFEXOR-XR) 75 MG 24 hr capsule; TAKE ONE CAPSULE BY MOUTH DAILY  WITH BREAKFAST  No refill sent at this time. -Stop Risperdal - lamoTRIgine (LAMICTAL) 200 MG tablet; Take 1 tablet (200 mg total) by mouth at bedtime. No refill sent at this time     3. ADHD   -Adderall 20 mg (10 mg tablets) BID.  Recently refilled will send refill when needed.   Follow Up Instructions:    I discussed the assessment and treatment plan with the patient. The patient was provided an opportunity to ask questions and all were answered. The patient agreed with the plan and demonstrated an understanding of the instructions.   The patient was advised to call back or seek an in-person evaluation if the symptoms worsen or if the condition fails to improve as anticipated.  I provided 8 minutes of non-face-to-face time during this encounter.   Briant Cedar, MD

## 2021-10-15 ENCOUNTER — Encounter (HOSPITAL_COMMUNITY): Payer: Self-pay | Admitting: Student in an Organized Health Care Education/Training Program

## 2021-10-31 ENCOUNTER — Telehealth (HOSPITAL_COMMUNITY): Payer: Self-pay

## 2021-10-31 ENCOUNTER — Other Ambulatory Visit (HOSPITAL_COMMUNITY): Payer: Self-pay | Admitting: Psychiatry

## 2021-10-31 DIAGNOSIS — F900 Attention-deficit hyperactivity disorder, predominantly inattentive type: Secondary | ICD-10-CM

## 2021-10-31 MED ORDER — AMPHETAMINE-DEXTROAMPHETAMINE 10 MG PO TABS: 20.0000 mg | ORAL_TABLET | Freq: Two times a day (BID) | ORAL | 0 refills | Status: DC

## 2021-10-31 NOTE — Telephone Encounter (Signed)
Medication refilled and sent to preferred pharmacy

## 2021-12-24 ENCOUNTER — Other Ambulatory Visit (HOSPITAL_COMMUNITY): Payer: Self-pay | Admitting: Psychiatry

## 2021-12-24 DIAGNOSIS — F319 Bipolar disorder, unspecified: Secondary | ICD-10-CM

## 2021-12-29 ENCOUNTER — Telehealth (HOSPITAL_COMMUNITY): Payer: Self-pay | Admitting: Student in an Organized Health Care Education/Training Program

## 2021-12-29 DIAGNOSIS — F411 Generalized anxiety disorder: Secondary | ICD-10-CM

## 2021-12-29 DIAGNOSIS — F319 Bipolar disorder, unspecified: Secondary | ICD-10-CM

## 2021-12-29 MED ORDER — LAMOTRIGINE 200 MG PO TABS: 200.0000 mg | ORAL_TABLET | Freq: Every day | ORAL | 1 refills | Status: DC

## 2021-12-29 MED ORDER — VENLAFAXINE HCL ER 75 MG PO CP24: ORAL_CAPSULE | ORAL | 1 refills | Status: DC

## 2021-12-29 NOTE — Telephone Encounter (Signed)
See message that patient needed refills on her medications.  Will send in a 2 month refill but prior to additional refills will need an appointment.   Sent: -Effexor 75 mg daily.  30 tablets with 1 refill. -Lamictal 200 mg QHS.  30 tablets with 1 refill.    Fatima Sanger MD Resident

## 2021-12-31 ENCOUNTER — Ambulatory Visit (INDEPENDENT_AMBULATORY_CARE_PROVIDER_SITE_OTHER): Payer: No Payment, Other | Admitting: Student in an Organized Health Care Education/Training Program

## 2021-12-31 VITALS — BP 139/88 | HR 84 | Ht 68.5 in | Wt 181.2 lb

## 2021-12-31 DIAGNOSIS — F3178 Bipolar disorder, in full remission, most recent episode mixed: Secondary | ICD-10-CM

## 2021-12-31 DIAGNOSIS — F900 Attention-deficit hyperactivity disorder, predominantly inattentive type: Secondary | ICD-10-CM

## 2021-12-31 DIAGNOSIS — F411 Generalized anxiety disorder: Secondary | ICD-10-CM

## 2021-12-31 MED ORDER — RISPERIDONE 1 MG PO TABS: 1.0000 mg | ORAL_TABLET | Freq: Every day | ORAL | Status: DC

## 2021-12-31 MED ORDER — AMPHETAMINE-DEXTROAMPHETAMINE 10 MG PO TABS: 20.0000 mg | ORAL_TABLET | Freq: Two times a day (BID) | ORAL | 0 refills | Status: DC

## 2021-12-31 NOTE — Progress Notes (Signed)
BH MD/PA/NP OP Progress Note  12/31/2021 10:32 AM Kathryn [name]  MRN:  [id number]  Chief Complaint:  Chief Complaint  Patient presents with   Follow-up   Medication Refill   HPI:  Kathryn [name] is a 26 yr old female who presents for follow up and medication management.  PPHx is significant for Bipolar Disorder, GAD, PTSD, and ADHD.  She reports that she is been doing well since her last appointment and things are getting better.  She reports that even though we did stop the Risperdal she restarted it about a month ago.  She reports that she got into an argument with her roommate and so was kicked out of the apartment and had to live in her car for 3 weeks.  She reports that she knew her anxiety would be high and she did not want to become unstable/that is why she restarted it at 1 mg at night.  She reports that she now has a new apartment and that her mood remained stable throughout this whole ordeal.  Discussed with her about continuing with Risperdal for now and she was agreeable to this.  She reports no SI, HI, or AVH.  She reports her sleep is good.  She reports her appetite is doing good.  She reports no other concerns at present.  She will return for follow-up in approximately 2 months.    Visit Diagnosis:    ICD-10-CM   1. Bipolar 1 disorder, mixed, full remission ([place])  F31.78     2. GAD (generalized anxiety disorder)  F41.1     3. Attention deficit hyperactivity disorder (ADHD), predominantly inattentive type  F90.0 amphetamine-dextroamphetamine (ADDERALL) 10 MG tablet      Past Psychiatric History: Bipolar Disorder, GAD, PTSD, and ADHD.  Past Medical History:  Past Medical History:  Diagnosis Date   Abnormal liver function tests 02/02/2016   ADHD 06/10/2016   Alcohol use 02/23/2016   Allergy    Anxiety    Arm fracture, right    as child, fx in three places, no surgery   Depression    Depression with anxiety 05/11/14   PTSD s/p death of best friend    Essential  hypertension 02/02/2016   GERD (gastroesophageal reflux disease)    H/O cold sores 04/15/2015   Headache 04/09/2016   Hyperlipidemia, mixed 02/02/2016   Migraine    Ovarian cyst    PCOS (polycystic ovarian syndrome)    Preventative health care 10/27/2015   Thrombocytosis 04/09/2016   Vaginal Pap smear, abnormal    HGSIL    Past Surgical History:  Procedure Laterality Date   ADENOIDECTOMY  2002   TYMPANOSTOMY TUBE PLACEMENT Bilateral 1999    Family Psychiatric History: Father- Anxiety Mother- OCD Brother- Anxiety Maternal Grandmother- Anxiety Maternal Uncle- Anxiety Maternal Aunt- Anxiety, Depression  Family History:  Family History  Problem Relation Age of Onset   Hypertension Mother    Heart disease Mother        CAD, stent at 44, s/p cardiac arrest with Defib   Hyperlipidemia Mother    Diabetes Mother        s/p gastric bypass   Obesity Mother        s/p gastric bypass   OCD Mother    Hyperlipidemia Father    Hypertension Father    Anxiety disorder Father    Asthma Sister    Hyperlipidemia Brother    Anxiety disorder Brother    Stroke Maternal Grandmother    Thyroid disease Maternal Grandmother  Anxiety disorder Maternal Grandmother    Heart disease Maternal Grandfather        MI first at 81, s/p stents and CABG   Hyperlipidemia Maternal Grandfather    Hypertension Maternal Grandfather    Stroke Maternal Grandfather    Cancer Maternal Grandfather        melanoma   Aneurysm Maternal Grandfather        brain   Kidney disease Maternal Grandfather        tumor   Heart disease Paternal Grandmother    Cancer Paternal Grandfather        ung and brain   Heart disease Maternal Uncle    Vision loss Maternal Uncle    Anxiety disorder Maternal Uncle    Anxiety disorder Maternal Aunt    Depression Maternal Aunt     Social History:  Social History   Socioeconomic History   Marital status: Single    Spouse name: Not on file   Number of children: 0   Years  of education: 12   Highest education level: Not on file  Occupational History    Comment: [employer]  Tobacco Use   Smoking status: Heavy Smoker    Packs/day: 1.50    Types: Cigarettes   Smokeless tobacco: Former    Types: Snuff  Vaping Use   Vaping Use: Never used  Substance and Sexual Activity   Alcohol use: No    Alcohol/week: 0.0 standard drinks of alcohol    Comment: maybe every 6 months   Drug use: Yes    Types: Marijuana    Comment: daily   Sexual activity: Yes    Partners: Male    Birth control/protection: Pill  Other Topics Concern   Not on file  Social History Narrative   Not on file   Social Determinants of Health   Financial Resource Strain: Not on file  Food Insecurity: Not on file  Transportation Needs: Not on file  Physical Activity: Not on file  Stress: Not on file  Social Connections: Not on file    Allergies:  Allergies  Allergen Reactions   Oxycodone Nausea Only, Nausea And Vomiting and Other (See Comments)    Oxycontin also Oxycontin also Oxycontin also Oxycontin also    Quetiapine Fumarate Other (See Comments)    Mental status changes Mental status changes Mental status changes Mental status changes     Metabolic Disorder Labs: Lab Results  Component Value Date   HGBA1C 5.2 11/17/2018   No results found for: "PROLACTIN" Lab Results  Component Value Date   CHOL 222 (H) 06/18/2021   TRIG 72.0 06/18/2021   HDL 56.90 06/18/2021   CHOLHDL 4 06/18/2021   VLDL 14.4 06/18/2021   LDLCALC 150 (H) 06/18/2021   LDLCALC 166 (H) 11/17/2018   Lab Results  Component Value Date   TSH 0.735 11/17/2018   TSH 2.49 05/05/2017    Therapeutic Level Labs: No results found for: "LITHIUM" No results found for: "VALPROATE" No results found for: "CBMZ"  Current Medications: Current Outpatient Medications  Medication Sig Dispense Refill   risperiDONE (RISPERDAL) 1 MG tablet Take 1 tablet (1 mg total) by mouth at bedtime.      amphetamine-dextroamphetamine (ADDERALL) 10 MG tablet Take 2 tablets (20 mg total) by mouth 2 (two) times daily. 120 tablet 0   ferrous sulfate 325 (65 FE) MG tablet Take 325 mg by mouth daily with breakfast.     HYDROcodone-acetaminophen (NORCO) 5-325 MG tablet Take 1 tablet by mouth every  6 (six) hours as needed for moderate pain. 30 tablet 0   lamoTRIgine (LAMICTAL) 200 MG tablet Take 1 tablet (200 mg total) by mouth at bedtime. Take one a day 30 tablet 1   Levonorgestrel-Ethinyl Estradiol (AMETHIA) 0.15-0.03 &0.01 MG tablet Take 1 tablet by mouth daily. 91 tablet 4   omeprazole (PRILOSEC) 40 MG capsule Take 1 capsule (40 mg total) by mouth 2 (two) times daily. 180 capsule 3   valACYclovir (VALTREX) 1000 MG tablet Take 1 tablet (1,000 mg total) by mouth daily. 90 tablet 4   venlafaxine XR (EFFEXOR-XR) 75 MG 24 hr capsule TAKE ONE CAPSULE BY MOUTH DAILY  WITH BREAKFAST 30 capsule 1   vitamin B-12 (CYANOCOBALAMIN) 500 MCG tablet Take 500 mcg by mouth daily.     No current facility-administered medications for this visit.     Musculoskeletal: Strength & Muscle Tone: within normal limits Gait & Station: normal Patient leans: N/A  Psychiatric Specialty Exam: Review of Systems  Respiratory:  Negative for shortness of breath.   Cardiovascular:  Negative for chest pain.  Gastrointestinal:  Negative for abdominal pain, constipation, diarrhea, nausea and vomiting.  Neurological:  Negative for dizziness, weakness and headaches.  Psychiatric/Behavioral:  Negative for dysphoric mood, hallucinations, sleep disturbance and suicidal ideas. The patient is not nervous/anxious.     Blood pressure 139/88, pulse 84, height 5' 8.5" (1.74 m), weight 181 lb 3.2 oz (82.2 kg), SpO2 100 %.Body mass index is 27.15 kg/m.  General Appearance: Casual and Fairly Groomed  Eye Contact:  Good  Speech:  Clear and Coherent and Normal Rate  Volume:  Normal  Mood:   "good"  Affect:  Appropriate and Congruent  Thought  Process:  Coherent and Goal Directed  Orientation:  Full (Time, Place, and Person)  Thought Content: WDL and Logical   Suicidal Thoughts:  No  Homicidal Thoughts:  No  Memory:  Immediate;   Good Recent;   Good  Judgement:  Good  Insight:  Good  Psychomotor Activity:  Normal  Concentration:  Concentration: Good and Attention Span: Good  Recall:  Good  Fund of Knowledge: Good  Language: Good  Akathisia:  Negative  Handed:  Right  AIMS (if indicated): not done  Assets:  Communication Skills Desire for Improvement Housing Physical Health Resilience  ADL's:  Intact  Cognition: WNL  Sleep:  Good   Screenings: GAD-7    Flowsheet Row Video Visit from 05/27/2021 in [facility] Video Visit from 03/13/2021 in [facility] Video Visit from 12/19/2020 in [facility] Video Visit from 09/19/2020 in [facility] Video Visit from 06/19/2020 in [facility]  Total GAD-7 Score '3 1 2 6 6      '$ PHQ2-9    Flowsheet Row Video Visit from 05/27/2021 in [facility] Video Visit from 03/13/2021 in [facility] Video Visit from 12/19/2020 in [facility] Video Visit from 09/19/2020 in [facility] Video Visit from 06/19/2020 in [place]  PHQ-2 Total Score '3 2 1 1 2  '$ PHQ-9 Total Score '6 7 2 4 9      '$ Flowsheet Row Video Visit from 05/27/2021 in [place] No Risk        Assessment and Plan:  Kathryn [name] is a 26 yr old female who presents for follow up and medication management.  PPHx is significant for Bipolar Disorder, GAD, PTSD, and ADHD.   Kathryn [name] has been doing well and handled a major event well without issue.  She restarted her Risperdal to ensure mood stability and will  continue at least until the next appointment.  We will not make any other changes to her medications at this time.  She will return for follow-up in approximately 2 months.   Bipolar 1 disorder, depressed  GAD -Continue Effexor XR 75 mg daily for depression and anxiety.  No refills sent. -Continue Risperdal 1 mg QHS for mood stability.  No refills sent. -Continue Lamictal 200 mg QHS for mood stability.  No refills sent.     ADHD -Continue Adderall 20 mg BID.  120 (10 mg) tablets with 0 refills.    Collaboration of Care: Collaboration of Care: Referral or follow-up with counselor/therapist [facility]  Patient/Guardian was advised Release of Information must be obtained prior to any record release in order to collaborate their care with an outside provider. Patient/Guardian was advised if they have not already done so to contact the registration department to sign all necessary forms in order for [name] to release information regarding their care.   Consent: Patient/Guardian gives verbal consent for treatment and assignment of benefits for services provided during this visit. Patient/Guardian expressed understanding and agreed to proceed.    Briant Cedar, MD 12/31/2021, 10:32 AM

## 2022-01-14 ENCOUNTER — Encounter: Payer: Self-pay | Admitting: Family Medicine

## 2022-01-14 ENCOUNTER — Telehealth (INDEPENDENT_AMBULATORY_CARE_PROVIDER_SITE_OTHER): Payer: Self-pay | Admitting: Family Medicine

## 2022-01-14 ENCOUNTER — Telehealth: Payer: Self-pay | Admitting: Family Medicine

## 2022-01-14 DIAGNOSIS — J069 Acute upper respiratory infection, unspecified: Secondary | ICD-10-CM

## 2022-01-14 NOTE — Telephone Encounter (Signed)
Patient requesting a work excuse for her appointment today be loaded to her [employer]

## 2022-01-14 NOTE — Progress Notes (Signed)
Virtual Visit via Video Note  I connected with Kathryn [name] on 01/14/22 at 11:00 AM EST by a video enabled telemedicine application 2/2 [id number] pandemic and verified that I am speaking with the correct person using two identifiers.  Location patient: home Location provider:work or home office Persons participating in the virtual visit: patient, provider  I discussed the limitations of evaluation and management by telemedicine and the availability of in person appointments. The patient expressed understanding and agreed to proceed. Chief Complaint  Patient presents with   Nasal Congestion    Cold Congestion      HPI: Pt is a 26 year old female with HTN, allergies, GERD, PCOS, ADHD, h/o bipolar 1 d/o, and tobacco use followed by Dr. Roque Cash and seen today for acute concern.  On Saturday pt developed a stuffy nose, like a cold.  Last night patient developed productive cough and ear pressure.  Coughing makes her throat feel irritated.  Denies HA, fever, sore throat, chills, n/v, diarrhea.  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 Several have been sick with cold like symptoms.  Tried Mucinex.  Taking allergy meds daily.  Has no spray at home but not currently using this does not feel like it is effective.  ROS: See pertinent positives and negatives per HPI.  Past Medical History:  Diagnosis Date   Abnormal liver function tests 02/02/2016   ADHD 06/10/2016   Alcohol use 02/23/2016   Allergy    Anxiety    Arm fracture, right    as child, fx in three places, no surgery   Depression    Depression with anxiety 04-17-14   PTSD s/p death of best friend    Essential hypertension 02/02/2016   GERD (gastroesophageal reflux disease)    H/O cold sores 04/15/2015   Headache 04/09/2016   Hyperlipidemia, mixed 02/02/2016   Migraine    Ovarian cyst    PCOS (polycystic ovarian syndrome)    Preventative health care 10/27/2015   Thrombocytosis 04/09/2016   Vaginal Pap smear, abnormal    HGSIL    Past Surgical History:   Procedure Laterality Date   ADENOIDECTOMY  2002   TYMPANOSTOMY TUBE PLACEMENT Bilateral 1999    Family History  Problem Relation Age of Onset   Hypertension Mother    Heart disease Mother        CAD, stent at 16, s/p cardiac arrest with Defib   Hyperlipidemia Mother    Diabetes Mother        s/p gastric bypass   Obesity Mother        s/p gastric bypass   OCD Mother    Hyperlipidemia Father    Hypertension Father    Anxiety disorder Father    Asthma Sister    Hyperlipidemia Brother    Anxiety disorder Brother    Stroke Maternal Grandmother    Thyroid disease Maternal Grandmother    Anxiety disorder Maternal Grandmother    Heart disease Maternal Grandfather        MI first at 37, s/p stents and CABG   Hyperlipidemia Maternal Grandfather    Hypertension Maternal Grandfather    Stroke Maternal Grandfather    Cancer Maternal Grandfather        melanoma   Aneurysm Maternal Grandfather        brain   Kidney disease Maternal Grandfather        tumor   Heart disease Paternal Grandmother    Cancer Paternal Grandfather        ung and brain   Heart disease  Maternal Uncle    Vision loss Maternal Uncle    Anxiety disorder Maternal Uncle    Anxiety disorder Maternal Aunt    Depression Maternal Aunt      Current Outpatient Medications:    amphetamine-dextroamphetamine (ADDERALL) 10 MG tablet, Take 2 tablets (20 mg total) by mouth 2 (two) times daily., Disp: 120 tablet, Rfl: 0   ferrous sulfate 325 (65 FE) MG tablet, Take 325 mg by mouth daily with breakfast., Disp: , Rfl:    HYDROcodone-acetaminophen (NORCO) 5-325 MG tablet, Take 1 tablet by mouth every 6 (six) hours as needed for moderate pain., Disp: 30 tablet, Rfl: 0   lamoTRIgine (LAMICTAL) 200 MG tablet, Take 1 tablet (200 mg total) by mouth at bedtime. Take one a day, Disp: 30 tablet, Rfl: 1   Levonorgestrel-Ethinyl Estradiol (AMETHIA) 0.15-0.03 &0.01 MG tablet, Take 1 tablet by mouth daily., Disp: 91 tablet, Rfl: 4    omeprazole (PRILOSEC) 40 MG capsule, Take 1 capsule (40 mg total) by mouth 2 (two) times daily., Disp: 180 capsule, Rfl: 3   risperiDONE (RISPERDAL) 1 MG tablet, Take 1 tablet (1 mg total) by mouth at bedtime., Disp: , Rfl:    valACYclovir (VALTREX) 1000 MG tablet, Take 1 tablet (1,000 mg total) by mouth daily., Disp: 90 tablet, Rfl: 4   venlafaxine XR (EFFEXOR-XR) 75 MG 24 hr capsule, TAKE ONE CAPSULE BY MOUTH DAILY  WITH BREAKFAST, Disp: 30 capsule, Rfl: 1   vitamin B-12 (CYANOCOBALAMIN) 500 MCG tablet, Take 500 mcg by mouth daily., Disp: , Rfl:   EXAM:  VITALS per patient if applicable: RR between 32-95 bpm  GENERAL: alert, oriented, appears well and in no acute distress  HEENT: atraumatic, conjunctiva clear, no obvious abnormalities on inspection of external nose and ears  NECK: normal movements of the head and neck  LUNGS: No coughing during visit.  On inspection no signs of respiratory distress, breathing rate appears normal, no obvious gross SOB, gasping or wheezing  CV: no obvious cyanosis  MS: moves all visible extremities without noticeable abnormality  PSYCH/NEURO: pleasant and cooperative, no obvious depression or anxiety, speech and thought processing grossly intact  ASSESSMENT AND PLAN:  Discussed the following assessment and plan:  Viral URI with cough -Discussed symptoms likely viral in nature such as acute nasopharyngitis.  Also consider COVID-19 and influenza. -COVID testing encouraged the patient currently at the end of the window for antiviral medication. -Discussed supportive care including OTC cough/cold medication, antihistamine, Flonase nasal spray, rest, hydration, etc. -h/o tobaccu use.  No history of COPD per chart review.  No antibiotic not indicated at this time. -Given strict precautions  Discussed limitations of virtual visit for respiratory evaluation. Follow-up with PCP as needed for continued or worsened symptoms   I discussed the assessment and  treatment plan with the patient. The patient was provided an opportunity to ask questions and all were answered. The patient agreed with the plan and demonstrated an understanding of the instructions.   The patient was advised to call back or seek an in-person evaluation if the symptoms worsen or if the condition fails to improve as anticipated.   Billie Ruddy, MD

## 2022-01-15 NOTE — Telephone Encounter (Signed)
[place] for note saying pt was seen.

## 2022-01-16 ENCOUNTER — Encounter (HOSPITAL_COMMUNITY): Payer: Self-pay

## 2022-01-16 ENCOUNTER — Ambulatory Visit (INDEPENDENT_AMBULATORY_CARE_PROVIDER_SITE_OTHER): Payer: 59 | Admitting: Mental Health

## 2022-01-16 DIAGNOSIS — F319 Bipolar disorder, unspecified: Secondary | ICD-10-CM

## 2022-01-16 DIAGNOSIS — F900 Attention-deficit hyperactivity disorder, predominantly inattentive type: Secondary | ICD-10-CM

## 2022-01-16 DIAGNOSIS — F4310 Post-traumatic stress disorder, unspecified: Secondary | ICD-10-CM | POA: Insufficient documentation

## 2022-01-16 NOTE — Plan of Care (Signed)
  Problem: Depression CCP Problem  1  Goal: LTG: Pierina WILL SCORE LESS THAN 10 ON THE PATIENT HEALTH QUESTIONNAIRE (PHQ-9) Outcome: Progressing Goal: STG: Fronia will increase management of moods AEB development of effective coping skills with ability to explore and engage in enjoyable activities and self-care weekly withn the next 6 months  Outcome: Progressing   Problem: PTSD-Trauma Disorder CCP Problem  1  Goal: LTG: Elimination of maladaptive behaviors and thinking patterns which interfere with resolution of trauma per self report  Outcome: Progressing Goal: STG: Jaliya will process past traumatic events in healthy manner AEB ability to identify and reframe distorted thinking patterns daily as needed within the next 6 months  Outcome: Progressing

## 2022-01-16 NOTE — Telephone Encounter (Signed)
Letter sent to patient via mychart.  
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
## 2022-01-28 ENCOUNTER — Other Ambulatory Visit (HOSPITAL_COMMUNITY): Payer: Self-pay | Admitting: Psychiatry

## 2022-01-28 DIAGNOSIS — F411 Generalized anxiety disorder: Secondary | ICD-10-CM

## 2022-01-28 DIAGNOSIS — F319 Bipolar disorder, unspecified: Secondary | ICD-10-CM

## 2022-01-29 ENCOUNTER — Other Ambulatory Visit (HOSPITAL_COMMUNITY): Payer: Self-pay

## 2022-02-02 ENCOUNTER — Telehealth (HOSPITAL_COMMUNITY): Payer: Self-pay | Admitting: *Deleted

## 2022-02-02 DIAGNOSIS — F900 Attention-deficit hyperactivity disorder, predominantly inattentive type: Secondary | ICD-10-CM

## 2022-02-02 NOTE — Telephone Encounter (Signed)
Pt that sees you at [facility] called several times she says trying to get the Adderall10 mg refilled. Last script was sent to pharmacy ( [facility] [address] ) on 12/31/21. Pt also requested a refill of Neurontin 300 mg. The last script I see for that was written on 03/13/21 #90 with 3 refills. Pt has an appointment with you on 03/02/22. Please review and advise.thanks.

## 2022-02-03 MED ORDER — AMPHETAMINE-DEXTROAMPHETAMINE 10 MG PO TABS: 20.0000 mg | ORAL_TABLET | Freq: Two times a day (BID) | ORAL | 0 refills | Status: DC

## 2022-02-03 NOTE — Telephone Encounter (Signed)
Patient requested refills of her Adderall and Gabapentin.  The Adderall will be sent, however, we will not fill the Gabapentin at this time as she has not been on this medication since March 2023.  She does have an appointment on 03/02/2022 so can discuss restarting Gabapentin at this time.    Sent: -Adderall 20 mg BID.  120 (10 mg) tablets with 0 refills.   Fatima Sanger MD Resident

## 2022-02-06 ENCOUNTER — Ambulatory Visit (INDEPENDENT_AMBULATORY_CARE_PROVIDER_SITE_OTHER): Payer: No Typology Code available for payment source | Admitting: Mental Health

## 2022-02-06 DIAGNOSIS — F411 Generalized anxiety disorder: Secondary | ICD-10-CM

## 2022-02-06 DIAGNOSIS — F319 Bipolar disorder, unspecified: Secondary | ICD-10-CM | POA: Diagnosis not present

## 2022-02-06 DIAGNOSIS — F900 Attention-deficit hyperactivity disorder, predominantly inattentive type: Secondary | ICD-10-CM

## 2022-02-06 DIAGNOSIS — F4310 Post-traumatic stress disorder, unspecified: Secondary | ICD-10-CM

## 2022-02-06 NOTE — Progress Notes (Signed)
THERAPIST PROGRESS NOTE Virtual Visit via Video Note  I connected with Kathryn [name] on 02/06/22 at  8:00 AM EST by a video enabled telemedicine application and verified that I am speaking with the correct person using two identifiers.  Location: Patient: address on file Provider: home office   I discussed the limitations of evaluation and management by telemedicine and the availability of in person appointments. The patient expressed understanding and agreed to proceed.  I discussed the assessment and treatment plan with the patient. The patient was provided an opportunity to ask questions and all were answered. The patient agreed with the plan and demonstrated an understanding of the instructions.   The patient was advised to call back or seek an in-person evaluation if the symptoms worsen or if the condition fails to improve as anticipated.  I provided 53 minutes of non-face-to-face time during this encounter.   Kathryn [name], [facility]   Session Time: 8:06 am (53 minutes)   Participation Level: Active  Behavioral Response: CasualAlertAdequate  Type of Therapy: Individual Therapy  Treatment Goals addressed: STG: Kathryn [name] will increase management of moods AEB development of effective coping skills with ability to explore and engage in enjoyable activities and self-care weekly withn the next 6 months   STG: Kathryn [name] will process past traumatic events in healthy manner AEB ability to identify and reframe distorted thinking patterns daily as needed within the next 6 months   ProgressTowards Goals: Initial  Interventions: CBT and Supportive  Summary: Kathryn [name] is a 26 y.o. female who presents with dx of bipolar d/o, PTSD, GAD and ADHD. Kathryn [name] presents alert and oriented; mood and affect adequate. Speech clear and coherent at normal rate and tone. Engaged and receptive to interventions. Shares for moods to be stable and medication compliant however expresses concern for  medications to have dulled her out and shares hx of being 'bubbly, talking going out and doing things." Shares hx of being inconsistent with medications due to this and wanting to remain compliant. Able to explore with therapist exploration of new normal and working though social anxieties. Shares negative thinking patterns and overthinking which have played as a barrier to engagement with others. Reports desire to have a family, re-engage in another relationship and strong friendships. Explored with therapist working to externalize anxiety as form of coping with anxious moods. Agrees to obtain journal to support in externalizing anxiety. Identifies large amount of anxiety to be unrealistic with 'what if' statements. Shares low self-esteem and low confidence levels to also contribute to low moods and increased anxiety. 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 Denies SI/HI/AVH.    Suicidal/Homicidal: Nowithout intent/plan  Therapist Response: Therapist engaged Kathryn [name] in [employer]. Confirmed current location and ability to hold confidential session. Therapist engaged Kathryn [name] in check in and assessed for current level of functioning, sxs management and level of stressors. Provided safe space for Kathryn [name] to share concern related to social anxiety and concerns for autism dx. Explored hx of manic episodes and working to identify normality of moods vs experiencing mood episode. Explored barriers to desired level of functioning. Engaged in education on anxiety and ways in which avoidance can perpetuate anxiety and mood concerns. Engaged in working to identify realistic vs. Unrealistic anxiety and working to externalize anxieties. Encouraged use of journal to work to cope with anxiety and  process thoughts and feelings in healthy manner and  explore progress. Reviewed session; provided follow up session and assessed for safety.   Plan: Return again in  x 4 weeks.  Diagnosis: Bipolar 1 disorder, depressed (HCC)  Post-traumatic stress  GAD (generalized anxiety disorder)  Attention deficit hyperactivity disorder (ADHD), predominantly inattentive type  Collaboration of Care: Other Ability to contact insurance to explore psychologist in network for concern for autism dx  Patient/Guardian was advised Release of Information must be obtained prior to any record release in order to collaborate their care with an outside provider. Patient/Guardian was advised if they have not already done so to contact the registration department to sign all necessary forms in order for [name] to release information regarding their care.   Consent: Patient/Guardian gives verbal consent for treatment and assignment of benefits for services provided during this visit. Patient/Guardian expressed understanding and agreed to proceed.   Kathryn [name], [facility] 02/06/2022

## 2022-03-02 ENCOUNTER — Ambulatory Visit (INDEPENDENT_AMBULATORY_CARE_PROVIDER_SITE_OTHER)
Payer: No Typology Code available for payment source | Admitting: Student in an Organized Health Care Education/Training Program

## 2022-03-02 DIAGNOSIS — F319 Bipolar disorder, unspecified: Secondary | ICD-10-CM

## 2022-03-02 DIAGNOSIS — F411 Generalized anxiety disorder: Secondary | ICD-10-CM

## 2022-03-02 DIAGNOSIS — F900 Attention-deficit hyperactivity disorder, predominantly inattentive type: Secondary | ICD-10-CM | POA: Diagnosis not present

## 2022-03-02 MED ORDER — VENLAFAXINE HCL ER 75 MG PO CP24: ORAL_CAPSULE | ORAL | 1 refills | Status: DC

## 2022-03-02 MED ORDER — AMPHETAMINE-DEXTROAMPHETAMINE 12.5 MG PO TABS: 25.0000 mg | ORAL_TABLET | Freq: Two times a day (BID) | ORAL | 0 refills | Status: DC

## 2022-03-02 MED ORDER — LAMOTRIGINE 200 MG PO TABS: 200.0000 mg | ORAL_TABLET | Freq: Every day | ORAL | 1 refills | Status: DC

## 2022-03-02 NOTE — Progress Notes (Signed)
BH MD/PA/NP OP Progress Note  Virtual Visit via Video Note  I connected with Kathryn [name] on 03/02/22 at  1:00 PM EST by a video enabled telemedicine application and verified that I am speaking with the correct person using two identifiers.  Location: Patient: Work Provider: [facility]   I discussed the limitations of evaluation and management by telemedicine and the availability of in person appointments. The patient expressed understanding and agreed to proceed.  03/02/2022 5:06 PM Kathryn [name]  MRN:  [id number]  Chief Complaint:  Chief Complaint  Patient presents with   Follow-up   ADHD   HPI:  Kathryn [name] is a 27 yr old female who presents via Virtual Video Visit for Follow Up and Medication Management. PPHx is significant for Bipolar Disorder, GAD, PTSD, and ADHD.   She reports that she has been doing well since her last appointment.  She reports that her mood has remained stable.  She reports no side effects from her medications.  She reports she is continuing to take 1 mg of Risperdal at night and plans to for the next month or 2 as she is planning to quit smoking and wants to ensure her mood stays stable during this additional stressor.  She reports that she is still having issues with concentration and focus at work.  Discussed when she takes her Adderall and she reports usually around 7 to 8 AM and 1 to 2 PM.  She reports that on the weekends she will only take 1 dose a day if she needs to do something.  Encouraged her to take medication holidays as she is able to.  Discussed we could trial a small increase in her Adderall.  She also reports that she has started therapy and so far it has been going well.  She reports no SI, HI, or AVH.  She reports her sleep is good.  She reports her appetite is doing good.  She reports no other concerns at present.  She return for follow-up in 6 weeks.  Visit Diagnosis:    ICD-10-CM   1. Attention deficit hyperactivity disorder (ADHD), predominantly  inattentive type  F90.0 amphetamine-dextroamphetamine (ADDERALL) 12.5 MG tablet    2. GAD (generalized anxiety disorder)  F41.1 venlafaxine XR (EFFEXOR-XR) 75 MG 24 hr capsule    3. Bipolar 1 disorder, depressed (HCC)  F31.9 venlafaxine XR (EFFEXOR-XR) 75 MG 24 hr capsule    lamoTRIgine (LAMICTAL) 200 MG tablet      Past Psychiatric History: Bipolar Disorder, GAD, PTSD, and ADHD.   Past Medical History:  Past Medical History:  Diagnosis Date   Abnormal liver function tests 02/02/2016   ADHD 06/10/2016   Alcohol use 02/23/2016   Allergy    Anxiety    Arm fracture, right    as child, fx in three places, no surgery   Depression    Depression with anxiety 2014/04/26   PTSD s/p death of best friend    Essential hypertension 02/02/2016   GERD (gastroesophageal reflux disease)    H/O cold sores 04/15/2015   Headache 04/09/2016   Hyperlipidemia, mixed 02/02/2016   Migraine    Ovarian cyst    PCOS (polycystic ovarian syndrome)    Preventative health care 10/27/2015   Thrombocytosis 04/09/2016   Vaginal Pap smear, abnormal    HGSIL    Past Surgical History:  Procedure Laterality Date   ADENOIDECTOMY  2002   TYMPANOSTOMY TUBE PLACEMENT Bilateral 1999    Family Psychiatric History: Maternal Aunt- Anxiety Maternal Grandfather- Anxiety Mother-  Anxiety and OCD  Family History:  Family History  Problem Relation Age of Onset   Hypertension Mother    Heart disease Mother        CAD, stent at 94, s/p cardiac arrest with Defib   Hyperlipidemia Mother    Diabetes Mother        s/p gastric bypass   Obesity Mother        s/p gastric bypass   OCD Mother    Hyperlipidemia Father    Hypertension Father    Anxiety disorder Father    Asthma Sister    Hyperlipidemia Brother    Anxiety disorder Brother    Stroke Maternal Grandmother    Thyroid disease Maternal Grandmother    Anxiety disorder Maternal Grandmother    Heart disease Maternal Grandfather        MI first at 75, s/p stents  and CABG   Hyperlipidemia Maternal Grandfather    Hypertension Maternal Grandfather    Stroke Maternal Grandfather    Cancer Maternal Grandfather        melanoma   Aneurysm Maternal Grandfather        brain   Kidney disease Maternal Grandfather        tumor   Heart disease Paternal Grandmother    Cancer Paternal Grandfather        ung and brain   Heart disease Maternal Uncle    Vision loss Maternal Uncle    Anxiety disorder Maternal Uncle    Anxiety disorder Maternal Aunt    Depression Maternal Aunt     Social History:  Social History   Socioeconomic History   Marital status: Single    Spouse name: Not on file   Number of children: 0   Years of education: 12   Highest education level: Not on file  Occupational History    Comment: [employer]  Tobacco Use   Smoking status: Heavy Smoker    Packs/day: 1.50    Types: Cigarettes   Smokeless tobacco: Former    Types: Snuff  Vaping Use   Vaping Use: Never used  Substance and Sexual Activity   Alcohol use: No    Alcohol/week: 0.0 standard drinks of alcohol    Comment: maybe every 6 months   Drug use: Yes    Types: Marijuana    Comment: daily   Sexual activity: Yes    Partners: Male    Birth control/protection: Pill  Other Topics Concern   Not on file  Social History Narrative   Not on file   Social Determinants of Health   Financial Resource Strain: Medium Risk (01/16/2022)   Overall Financial Resource Strain (CARDIA)    Difficulty of Paying Living Expenses: Somewhat hard  Food Insecurity: No Food Insecurity (01/16/2022)   Hunger Vital Sign    Worried About Running Out of Food in the Last Year: Never true    Ran Out of Food in the Last Year: Never true  Transportation Needs: No Transportation Needs (01/16/2022)   PRAPARE - Hydrologist (Medical): No    Lack of Transportation (Non-Medical): No  Physical Activity: Inactive (01/16/2022)   Exercise Vital Sign    Days of Exercise per  Week: 0 days    Minutes of Exercise per Session: 0 min  Stress: No Stress Concern Present (01/16/2022)   [place]    Feeling of Stress : Only a little  Social Connections: Socially Isolated (01/16/2022)  Social Licensed conveyancer [NHANES]    Frequency of Communication with Friends and Family: More than three times a week    Frequency of Social Gatherings with Friends and Family: Never    Attends Religious Services: Never    Marine scientist or Organizations: No    Attends Archivist Meetings: Never    Marital Status: Never married    Allergies:  Allergies  Allergen Reactions   Oxycodone Nausea Only, Nausea And Vomiting and Other (See Comments)    Oxycontin also Oxycontin also Oxycontin also Oxycontin also    Quetiapine Fumarate Other (See Comments)    Mental status changes Mental status changes Mental status changes Mental status changes     Metabolic Disorder Labs: Lab Results  Component Value Date   HGBA1C 5.2 11/17/2018   No results found for: "PROLACTIN" Lab Results  Component Value Date   CHOL 222 (H) 06/18/2021   TRIG 72.0 06/18/2021   HDL 56.90 06/18/2021   CHOLHDL 4 06/18/2021   VLDL 14.4 06/18/2021   LDLCALC 150 (H) 06/18/2021   LDLCALC 166 (H) 11/17/2018   Lab Results  Component Value Date   TSH 0.735 11/17/2018   TSH 2.49 05/05/2017    Therapeutic Level Labs: No results found for: "LITHIUM" No results found for: "VALPROATE" No results found for: "CBMZ"  Current Medications: Current Outpatient Medications  Medication Sig Dispense Refill   amphetamine-dextroamphetamine (ADDERALL) 12.5 MG tablet Take 2 tablets by mouth 2 (two) times daily. 120 tablet 0   ferrous sulfate 325 (65 FE) MG tablet Take 325 mg by mouth daily with breakfast.     HYDROcodone-acetaminophen (NORCO) 5-325 MG tablet Take 1 tablet by mouth every 6 (six) hours as needed for moderate  pain. 30 tablet 0   lamoTRIgine (LAMICTAL) 200 MG tablet Take 1 tablet (200 mg total) by mouth at bedtime. Take one a day 30 tablet 1   Levonorgestrel-Ethinyl Estradiol (AMETHIA) 0.15-0.03 &0.01 MG tablet Take 1 tablet by mouth daily. 91 tablet 4   omeprazole (PRILOSEC) 40 MG capsule Take 1 capsule (40 mg total) by mouth 2 (two) times daily. 180 capsule 3   risperiDONE (RISPERDAL) 1 MG tablet Take 1 tablet (1 mg total) by mouth at bedtime.     valACYclovir (VALTREX) 1000 MG tablet Take 1 tablet (1,000 mg total) by mouth daily. 90 tablet 4   venlafaxine XR (EFFEXOR-XR) 75 MG 24 hr capsule TAKE ONE CAPSULE BY MOUTH DAILY  WITH BREAKFAST 30 capsule 1   vitamin B-12 (CYANOCOBALAMIN) 500 MCG tablet Take 500 mcg by mouth daily.     No current facility-administered medications for this visit.     Musculoskeletal: Strength & Muscle Tone: within normal limits Gait & Station: normal Patient leans: N/A  Psychiatric Specialty Exam: Review of Systems  Respiratory:  Negative for shortness of breath.   Cardiovascular:  Negative for chest pain.  Gastrointestinal:  Negative for abdominal pain, constipation, diarrhea, nausea and vomiting.  Neurological:  Negative for dizziness, weakness and headaches.  Psychiatric/Behavioral:  Negative for dysphoric mood, hallucinations, sleep disturbance and suicidal ideas. The patient is not nervous/anxious.     There were no vitals taken for this visit.There is no height or weight on file to calculate BMI.  General Appearance: Casual and Fairly Groomed  Eye Contact:  Good  Speech:  Clear and Coherent and Normal Rate  Volume:  Normal  Mood:   "good"  Affect:  Appropriate and Congruent  Thought Process:  Coherent and Goal Directed  Orientation:  Full (Time, Place, and Person)  Thought Content: WDL and Logical   Suicidal Thoughts:  No  Homicidal Thoughts:  No  Memory:  Immediate;   Good Recent;   Good  Judgement:  Good  Insight:  Good  Psychomotor Activity:   Normal  Concentration:  Concentration: Good and Attention Span: Good  Recall:  Good  Fund of Knowledge: Good  Language: Good  Akathisia:  Negative  Handed:  Right  AIMS (if indicated): not done  Assets:  Communication Skills Desire for Improvement Housing Physical Health Resilience  ADL's:  Intact  Cognition: WNL  Sleep:  Good   Screenings: AUDIT    Health and safety inspector from 01/16/2022 in [facility]  Alcohol Use Disorder Identification Test Final Score (AUDIT) 8      GAD-7    Flowsheet Row Counselor from 01/16/2022 in [facility] Video Visit from 05/27/2021 in [facility] Video Visit from 03/13/2021 in [facility] Video Visit from 12/19/2020 in [facility] Video Visit from 09/19/2020 in [facility]  Total GAD-7 Score '7 3 1 2 6      '$ PHQ2-9    [place] Counselor from 01/16/2022 in [facility] Video Visit from 05/27/2021 in [facility] Video Visit from 03/13/2021 in [facility] Video Visit from 12/19/2020 in [facility] Video Visit from 09/19/2020 in [place]  PHQ-2 Total Score '5 3 2 1 1  '$ PHQ-9 Total Score '14 6 7 2 4      '$ [place] Counselor from 01/16/2022 in [facility] Video Visit from 05/27/2021 in [place] No Risk        Assessment and Plan:  Kathryn [name] is a 27 yr old female who presents via Virtual Video Visit for Follow Up and Medication Management. PPHx is significant for Bipolar Disorder, GAD, PTSD, and ADHD.    Kathryn [name] has continued to do well since her last appointment.  She wants to continue the Risperdal for now as she is working on stopping smoking.   She is continuing to have issues with attention and focus so we will increase her Adderall.  We will not make any other changes to her medication at this time.  She will return follow-up in approximately 6 weeks.    Bipolar 1 disorder, depressed  GAD -Continue Effexor XR 75 mg daily for depression and anxiety.  30 tablets with 1 refill. -Continue Risperdal 1 mg QHS for mood stability.  No refills sent. -Continue Lamictal 200 mg QHS for mood stability.  30 tablets with 1 refill.     ADHD -Continue Adderall 20 mg BID.  120 (12.5 mg) tablets with 0 refills.    Collaboration of Care: Collaboration of Care: Other provider involved in patient's care AEB [facility] Therapist  Patient/Guardian was advised Release of Information must be obtained prior to any record release in order to collaborate their care with an outside provider. Patient/Guardian was advised if they have not already done so to contact the registration department to sign all necessary forms in order for [name] to release information regarding their care.   Consent: Patient/Guardian gives verbal consent for treatment and assignment of benefits for services provided during this visit. Patient/Guardian expressed understanding and agreed to proceed.    Redgie Grayer  Kai Levins, MD 03/02/2022, 5:06 PM    Follow Up Instructions:    I discussed the assessment and treatment plan with the patient. The patient was provided an opportunity to ask questions and all were answered. The patient agreed with the plan and demonstrated an understanding of the instructions.   The patient was advised to call back or seek an in-person evaluation if the symptoms worsen or if the condition fails to improve as anticipated.  I provided 12 minutes of non-face-to-face time during this encounter.   Briant Cedar, MD

## 2022-03-09 ENCOUNTER — Ambulatory Visit (INDEPENDENT_AMBULATORY_CARE_PROVIDER_SITE_OTHER): Payer: No Typology Code available for payment source | Admitting: Mental Health

## 2022-03-09 DIAGNOSIS — F411 Generalized anxiety disorder: Secondary | ICD-10-CM

## 2022-03-09 DIAGNOSIS — F4310 Post-traumatic stress disorder, unspecified: Secondary | ICD-10-CM

## 2022-03-09 DIAGNOSIS — F900 Attention-deficit hyperactivity disorder, predominantly inattentive type: Secondary | ICD-10-CM

## 2022-03-09 DIAGNOSIS — F319 Bipolar disorder, unspecified: Secondary | ICD-10-CM
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
## 2022-03-30 ENCOUNTER — Ambulatory Visit (INDEPENDENT_AMBULATORY_CARE_PROVIDER_SITE_OTHER): Payer: No Typology Code available for payment source | Admitting: Mental Health

## 2022-03-30 DIAGNOSIS — F411 Generalized anxiety disorder: Secondary | ICD-10-CM

## 2022-03-30 DIAGNOSIS — F4310 Post-traumatic stress disorder, unspecified: Secondary | ICD-10-CM

## 2022-03-30 DIAGNOSIS — F319 Bipolar disorder, unspecified: Secondary | ICD-10-CM

## 2022-03-30 NOTE — Progress Notes (Signed)
THERAPIST PROGRESS NOTE Virtual Visit via Video Note  I connected with Kathryn [name] on 03/30/22 at  8:00 AM EST by a video enabled telemedicine application and verified that I am speaking with the correct person using two identifiers.  Location: Patient: home address  Provider: home office    I discussed the limitations of evaluation and management by telemedicine and the availability of in person appointments. The patient expressed understanding and agreed to proceed.  I discussed the assessment and treatment plan with the patient. The patient was provided an opportunity to ask questions and all were answered. The patient agreed with the plan and demonstrated an understanding of the instructions.   The patient was advised to call back or seek an in-person evaluation if the symptoms worsen or if the condition fails to improve as anticipated.  I provided 50 minutes of non-face-to-face time during this encounter.   Marion Downer, [facility]   Session Time: 8:05am ( 50 minutes)  Participation Level: Active  Behavioral Response: CasualAlertAdequate   Type of Therapy: Individual Therapy  Treatment Goals addressed: STG: Kathryn [name] will increase management of moods AEB development of effective coping skills with ability to explore and engage in enjoyable activities and self-care weekly withn the next 6 months    STG: Kathryn [name] will process past traumatic events in healthy manner AEB ability to identify and reframe distorted thinking patterns daily as needed within the next 6 months    ProgressTowards Goals: Progressing  Interventions: CBT and Supportive  Summary: Kathryn [name] is a 27 y.o. female who presents with dx of bipolar d/o, PTSD, GAD and ADHD. Kathryn [name] presents alert and oriented; mood and affect adequate. Speech clear and coherent at normal rate and tone. Engaged and receptive to interventions. Denies extreme highs or lows. Reports for moods to have been stable. Shares concerning  working on social anxiety and has been increasing engagement in community. Reports events of presenting out to eat alone and presenting for a date. Reports feelings of anxiety and able to navigate through with anxiety decreasing. Able to process exercising good judgement with dating. Denies trauma sxs and able to process feelings of grief and working to not avoid talking about girlfriend who passed and engagement with her daughter and family. Shares ongoing vivid dreams of abondement and dreams deceased girlfriend using substances. Shares working to set boundaries with self and others and engaging in self-care. Shares plans of presenting to school and shares worries of engaging in school. Explored feelings of self-esteem and working towards progress. Denies SI/HI. Sxs stable at this time; progress with engagement in community and self-care. Processing traumatic memories in effective manner.    Suicidal/Homicidal: Nowithout intent/plan  Therapist Response: Therapist engaged Kathryn [name] in [employer]. Confirmed current location and ability to hold confidential session. Therapist engaged Kathryn [name] in check in and assessed for current level of functioning sxs management. Engaged Kathryn [name] in sharing concerning ability to make progress with social anxiety and explored ability to navigate social settings. Explored abiity to engage in healthy thought process and engagement in balanced thinking. Guided discovery in working through anxiety and 'spotlight fallacy.' Supported in discussing ability to set boundaries with self and others. Assessed for trauma sxs and provided psycho-education on avoidance and role it plays with trauma and anxiety. Reviewed ability to engage in homework and exploring feelings of self. Reviewed session. Provided follow up appointment. Assessed for safety. No safety concerns present at this time.   Plan: Return again in  x 4 weeks.  Diagnosis: Bipolar  1 disorder, depressed  ([place])  Post-traumatic stress  GAD (generalized anxiety disorder)  Collaboration of Care: Other None  Patient/Guardian was advised Release of Information must be obtained prior to any record release in order to collaborate their care with an outside provider. Patient/Guardian was advised if they have not already done so to contact the registration department to sign all necessary forms in order for [name] to release information regarding their care.   Consent: Patient/Guardian gives verbal consent for treatment and assignment of benefits for services provided during this visit. Patient/Guardian expressed understanding and agreed to proceed.   Rockey Situ [place], [facility] 03/30/2022

## 2022-04-02 ENCOUNTER — Telehealth (HOSPITAL_COMMUNITY): Payer: Self-pay | Admitting: *Deleted

## 2022-04-02 DIAGNOSIS — F900 Attention-deficit hyperactivity disorder, predominantly inattentive type: Secondary | ICD-10-CM

## 2022-04-02 NOTE — Telephone Encounter (Signed)
Patient called asking for refill of Adderall. Next appointment 04/17/22.

## 2022-04-03 ENCOUNTER — Telehealth (HOSPITAL_COMMUNITY): Payer: Self-pay

## 2022-04-03 MED ORDER — AMPHETAMINE-DEXTROAMPHETAMINE 12.5 MG PO TABS: 25.0000 mg | ORAL_TABLET | Freq: Two times a day (BID) | ORAL | 0 refills | Status: DC

## 2022-04-03 NOTE — Telephone Encounter (Signed)
Received message that patient needed refill of her Adderall.  Per PDMP last fill was 1/16.  Next appointment scheduled is 3/1.  Refill was sent.   Sent: -Adderall 25 mg BID.  120 (12.5 mg) tablets with 0 refills.    Fatima Sanger MD Resident

## 2022-04-03 NOTE — Telephone Encounter (Signed)
Medication management - Telephone call with patient's [place] after completing a prior authorization on-line with CoverMyMeds for her Adderall 12.5 mg, #120 per month. Collateral verified able to now fill as prescribed.

## 2022-04-17 ENCOUNTER — Telehealth (HOSPITAL_COMMUNITY): Payer: Self-pay | Admitting: Student in an Organized Health Care Education/Training Program

## 2022-04-17 ENCOUNTER — Encounter (HOSPITAL_COMMUNITY): Payer: No Payment, Other | Admitting: Student in an Organized Health Care Education/Training Program

## 2022-04-17 DIAGNOSIS — F319 Bipolar disorder, unspecified: Secondary | ICD-10-CM

## 2022-04-17 DIAGNOSIS — F900 Attention-deficit hyperactivity disorder, predominantly inattentive type: Secondary | ICD-10-CM

## 2022-04-17 DIAGNOSIS — F411 Generalized anxiety disorder: Secondary | ICD-10-CM

## 2022-04-17 MED ORDER — VENLAFAXINE HCL ER 75 MG PO CP24: ORAL_CAPSULE | ORAL | 1 refills | Status: DC

## 2022-04-17 MED ORDER — LAMOTRIGINE 200 MG PO TABS: 200.0000 mg | ORAL_TABLET | Freq: Every day | ORAL | 1 refills | Status: DC

## 2022-04-17 MED ORDER — RISPERIDONE 1 MG PO TABS: 1.0000 mg | ORAL_TABLET | Freq: Every day | ORAL | Status: DC

## 2022-04-17 MED ORDER — AMPHETAMINE-DEXTROAMPHETAMINE 12.5 MG PO TABS: 25.0000 mg | ORAL_TABLET | Freq: Two times a day (BID) | ORAL | 0 refills | Status: DC

## 2022-04-17 NOTE — Progress Notes (Deleted)
BH MD/PA/NP OP Progress Note  04/17/2022 7:23 AM Kathryn [name]  MRN:  [id number]  Chief Complaint: No chief complaint on file.  HPI:  Kathryn [name] is a 27 yr old female who presents via Virtual Video Visit for Follow Up and Medication Management.  PPHx is significant for Bipolar Disorder, GAD, PTSD, and ADHD.   ***   Visit Diagnosis: No diagnosis found.  Past Psychiatric History: Bipolar Disorder, GAD, PTSD, and ADHD.  Past Medical History:  Past Medical History:  Diagnosis Date   Abnormal liver function tests 02/02/2016   ADHD 06/10/2016   Alcohol use 02/23/2016   Allergy    Anxiety    Arm fracture, right    as child, fx in three places, no surgery   Depression    Depression with anxiety 04-30-2014   PTSD s/p death of best friend    Essential hypertension 02/02/2016   GERD (gastroesophageal reflux disease)    H/O cold sores 04/15/2015   Headache 04/09/2016   Hyperlipidemia, mixed 02/02/2016   Migraine    Ovarian cyst    PCOS (polycystic ovarian syndrome)    Preventative health care 10/27/2015   Thrombocytosis 04/09/2016   Vaginal Pap smear, abnormal    HGSIL    Past Surgical History:  Procedure Laterality Date   ADENOIDECTOMY  2002   TYMPANOSTOMY TUBE PLACEMENT Bilateral 1999    Family Psychiatric History: Maternal Aunt- Anxiety Maternal Grandfather- Anxiety Mother- Anxiety and OCD  Family History:  Family History  Problem Relation Age of Onset   Hypertension Mother    Heart disease Mother        CAD, stent at 67, s/p cardiac arrest with Defib   Hyperlipidemia Mother    Diabetes Mother        s/p gastric bypass   Obesity Mother        s/p gastric bypass   OCD Mother    Hyperlipidemia Father    Hypertension Father    Anxiety disorder Father    Asthma Sister    Hyperlipidemia Brother    Anxiety disorder Brother    Stroke Maternal Grandmother    Thyroid disease Maternal Grandmother    Anxiety disorder Maternal Grandmother    Heart disease Maternal  Grandfather        MI first at 38, s/p stents and CABG   Hyperlipidemia Maternal Grandfather    Hypertension Maternal Grandfather    Stroke Maternal Grandfather    Cancer Maternal Grandfather        melanoma   Aneurysm Maternal Grandfather        brain   Kidney disease Maternal Grandfather        tumor   Heart disease Paternal Grandmother    Cancer Paternal Grandfather        ung and brain   Heart disease Maternal Uncle    Vision loss Maternal Uncle    Anxiety disorder Maternal Uncle    Anxiety disorder Maternal Aunt    Depression Maternal Aunt     Social History:  Social History   Socioeconomic History   Marital status: Single    Spouse name: Not on file   Number of children: 0   Years of education: 12   Highest education level: Not on file  Occupational History    Comment: [employer]  Tobacco Use   Smoking status: Heavy Smoker    Packs/day: 1.50    Types: Cigarettes   Smokeless tobacco: Former    Types: Snuff  Vaping Use  Vaping Use: Never used  Substance and Sexual Activity   Alcohol use: No    Alcohol/week: 0.0 standard drinks of alcohol    Comment: maybe every 6 months   Drug use: Yes    Types: Marijuana    Comment: daily   Sexual activity: Yes    Partners: Male    Birth control/protection: Pill  Other Topics Concern   Not on file  Social History Narrative   Not on file   Social Determinants of Health   Financial Resource Strain: Medium Risk (01/16/2022)   Overall Financial Resource Strain (CARDIA)    Difficulty of Paying Living Expenses: Somewhat hard  Food Insecurity: No Food Insecurity (01/16/2022)   Hunger Vital Sign    Worried About Running Out of Food in the Last Year: Never true    [place] in the Last Year: Never true  Transportation Needs: No Transportation Needs (01/16/2022)   PRAPARE - Hydrologist (Medical): No    Lack of Transportation (Non-Medical): No  Physical Activity: Inactive (01/16/2022)    Exercise Vital Sign    Days of Exercise per Week: 0 days    Minutes of Exercise per Session: 0 min  Stress: No Stress Concern Present (01/16/2022)   [place]    Feeling of Stress : Only a little  Social Connections: Socially Isolated (01/16/2022)   Social Connection and Isolation Panel [NHANES]    Frequency of Communication with Friends and Family: More than three times a week    Frequency of Social Gatherings with Friends and Family: Never    Attends Religious Services: Never    Marine scientist or Organizations: No    Attends Archivist Meetings: Never    Marital Status: Never married    Allergies:  Allergies  Allergen Reactions   Oxycodone Nausea Only, Nausea And Vomiting and Other (See Comments)    Oxycontin also Oxycontin also Oxycontin also Oxycontin also    Quetiapine Fumarate Other (See Comments)    Mental status changes Mental status changes Mental status changes Mental status changes     Metabolic Disorder Labs: Lab Results  Component Value Date   HGBA1C 5.2 11/17/2018   No results found for: "PROLACTIN" Lab Results  Component Value Date   CHOL 222 (H) 06/18/2021   TRIG 72.0 06/18/2021   HDL 56.90 06/18/2021   CHOLHDL 4 06/18/2021   VLDL 14.4 06/18/2021   LDLCALC 150 (H) 06/18/2021   LDLCALC 166 (H) 11/17/2018   Lab Results  Component Value Date   TSH 0.735 11/17/2018   TSH 2.49 05/05/2017    Therapeutic Level Labs: No results found for: "LITHIUM" No results found for: "VALPROATE" No results found for: "CBMZ"  Current Medications: Current Outpatient Medications  Medication Sig Dispense Refill   amphetamine-dextroamphetamine (ADDERALL) 12.5 MG tablet Take 2 tablets by mouth 2 (two) times daily. 120 tablet 0   ferrous sulfate 325 (65 FE) MG tablet Take 325 mg by mouth daily with breakfast.     HYDROcodone-acetaminophen (NORCO) 5-325 MG tablet Take 1 tablet by  mouth every 6 (six) hours as needed for moderate pain. 30 tablet 0   lamoTRIgine (LAMICTAL) 200 MG tablet Take 1 tablet (200 mg total) by mouth at bedtime. Take one a day 30 tablet 1   Levonorgestrel-Ethinyl Estradiol (AMETHIA) 0.15-0.03 &0.01 MG tablet Take 1 tablet by mouth daily. 91 tablet 4   omeprazole (PRILOSEC) 40 MG capsule Take  1 capsule (40 mg total) by mouth 2 (two) times daily. 180 capsule 3   risperiDONE (RISPERDAL) 1 MG tablet Take 1 tablet (1 mg total) by mouth at bedtime.     valACYclovir (VALTREX) 1000 MG tablet Take 1 tablet (1,000 mg total) by mouth daily. 90 tablet 4   venlafaxine XR (EFFEXOR-XR) 75 MG 24 hr capsule TAKE ONE CAPSULE BY MOUTH DAILY  WITH BREAKFAST 30 capsule 1   vitamin B-12 (CYANOCOBALAMIN) 500 MCG tablet Take 500 mcg by mouth daily.     No current facility-administered medications for this visit.     Musculoskeletal: Strength & Muscle Tone: {desc; muscle tone:32375} Gait & Station: {PE GAIT ED EF:[id number] Patient leans: {Patient Leans:21022755}  Psychiatric Specialty Exam: Review of Systems  There were no vitals taken for this visit.There is no height or weight on file to calculate BMI.  General Appearance: {Appearance:22683}  Eye Contact:  {BHH EYE CONTACT:22684}  Speech:  {Speech:22685}  Volume:  {Volume (PAA):22686}  Mood:  {BHH MOOD:22306}  Affect:  {Affect (PAA):22687}  Thought Process:  {Thought Process (PAA):22688}  Orientation:  {BHH ORIENTATION (PAA):22689}  Thought Content: {Thought Content:22690}   Suicidal Thoughts:  {ST/HT (PAA):22692}  Homicidal Thoughts:  {ST/HT (PAA):22692}  Memory:  {BHH MEMORY:22881}  Judgement:  {Judgement (PAA):22694}  Insight:  {Insight (PAA):22695}  Psychomotor Activity:  {Psychomotor (PAA):22696}  Concentration:  {Concentration:21399}  Recall:  {BHH GOOD/FAIR/POOR:22877}  Fund of Knowledge: {BHH GOOD/FAIR/POOR:22877}  Language: {BHH GOOD/FAIR/POOR:22877}  Akathisia:  {BHH YES OR NO:22294}  Handed:   Right  AIMS (if indicated): {Desc; done/not:10129}  Assets:  {Assets (PAA):22698}  ADL's:  {BHH XO:4411959  Cognition: {chl bhh cognition:304700322}  Sleep:  {BHH GOOD/FAIR/POOR:22877}   Screenings: AUDIT    Health and safety inspector from 01/16/2022 in [facility]  Alcohol Use Disorder Identification Test Final Score (AUDIT) 8      GAD-7    Flowsheet Row Counselor from 01/16/2022 in [facility] Video Visit from 05/27/2021 in [facility] Video Visit from 03/13/2021 in [facility] Video Visit from 12/19/2020 in [facility] Video Visit from 09/19/2020 in [facility]  Total GAD-7 Score '7 3 1 2 6      '$ PHQ2-9    Flowsheet Row Counselor from 01/16/2022 in [facility] Video Visit from 05/27/2021 in [facility] Video Visit from 03/13/2021 in [facility] Video Visit from 12/19/2020 in [facility] Video Visit from 09/19/2020 in [place]  PHQ-2 Total Score '5 3 2 1 1  '$ PHQ-9 Total Score '14 6 7 2 4      '$ Flowsheet Row Counselor from 01/16/2022 in [facility] Video Visit from 05/27/2021 in [place] No Risk        Assessment and Plan:  Kathryn [name] is a 27 yr old female who presents via Virtual Video Visit for Follow Up and Medication Management. PPHx is significant for Bipolar Disorder, GAD, PTSD, and ADHD.    ***   Bipolar 1 disorder, depressed  GAD -Continue Effexor XR 75 mg daily for depression and anxiety.  30 tablets with 1 refill. -Continue Risperdal 1 mg QHS for mood stability.  No refills sent. -Continue Lamictal 200 mg QHS for mood stability.  30 tablets with 1 refill.      ADHD -Continue Adderall 20  mg BID.  120 (12.5 mg) tablets with 0 refills.   Collaboration of Care: Collaboration of Care: Other provider involved in patient's care AEB [facility] Therapist  Patient/Guardian was advised Release of Information must be obtained prior to any record release in order to collaborate their care with an outside provider. Patient/Guardian was advised if they have not already done so to contact the registration department to sign all necessary forms in order for [name] to release information regarding their care.   Consent: Patient/Guardian gives verbal consent for treatment and assignment of benefits for services provided during this visit. Patient/Guardian expressed understanding and agreed to proceed.    Briant Cedar, MD 04/17/2022, 7:23 AM

## 2022-04-17 NOTE — Telephone Encounter (Signed)
Received message that patient's appointment scheduled today was supposed to be virtual and needs refills of her medication sent in.  Refills will be sent in so that she can continue with her medications until her appointment can be rescheduled.   Sent: Bipolar 1 disorder, depressed  GAD -Continue Effexor XR 75 mg daily for depression and anxiety.  30 tablets with 1 refill. -Continue Risperdal 1 mg QHS for mood stability.  No refills sent. -Continue Lamictal 200 mg QHS for mood stability.  30 tablets with 1 refill.     ADHD -Continue Adderall 20 mg BID.  120 (12.5 mg) tablets with 0 refills.     Fatima Sanger MD Resident

## 2022-04-24 ENCOUNTER — Encounter: Payer: Self-pay | Admitting: Nurse Practitioner

## 2022-04-24 ENCOUNTER — Ambulatory Visit (INDEPENDENT_AMBULATORY_CARE_PROVIDER_SITE_OTHER): Payer: No Typology Code available for payment source | Admitting: Nurse Practitioner

## 2022-04-24 VITALS — BP 102/70 | HR 107 | Temp 98.5°F | Resp 16 | Ht 68.5 in | Wt 196.0 lb

## 2022-04-24 DIAGNOSIS — J069 Acute upper respiratory infection, unspecified: Secondary | ICD-10-CM | POA: Diagnosis not present

## 2022-04-24 DIAGNOSIS — Z20828 Contact with and (suspected) exposure to other viral communicable diseases: Secondary | ICD-10-CM | POA: Diagnosis not present

## 2022-04-24 LAB — POC COVID19 BINAXNOW: SARS Coronavirus 2 Ag: NEGATIVE

## 2022-04-24 LAB — POCT INFLUENZA A/B
Influenza A, POC: NEGATIVE
Influenza B, POC: NEGATIVE

## 2022-04-24 MED ORDER — XOFLUZA (80 MG DOSE) 2 X 40 MG PO TBPK: 1.0000 | ORAL_TABLET | Freq: Every morning | ORAL | 0 refills | Status: DC

## 2022-04-24 NOTE — Patient Instructions (Signed)
URI Instructions: Encourage adequate oral hydration. Avoid decongestants if you have high blood pressure. Encourage adequate oral hydration. Use over-the-counter  cold" medicine  such as Dayquil/nyquil for cough and congestion.  Use mucinex DM or Robitussin  or delsym for cough.  You can use plain "Tylenol" or "Advil" for fever, chills and achyness. Use cool mist humidifier at bedtime to help with nasal congestion and cough.  Cold/cough medications may have tylenol or ibuprofen or guaifenesin or dextromethophan in them, so be careful not to take beyond the recommended dose for each of these medications.   "Common cold" symptoms are usually triggered by a virus.  The antibiotics are usually not necessary. On average, a" viral cold" illness may take 7-10 days to resolve. Please, make an appointment if you are not better or if you're worse.

## 2022-04-24 NOTE — Progress Notes (Signed)
Acute Office Visit  Subjective:    Patient ID: Kathryn [name], female    DOB: 1995/07/01, 27 y.o.   MRN: [id number]  Chief Complaint  Patient presents with   URI    Started- Wednesday with dry cough, fever, headache and malaise    URI  This is a new problem. The current episode started in the past 7 days (onset on wednesday). The problem has been unchanged. The maximum temperature recorded prior to her arrival was 100.4 - 100.9 F. The fever has been present for 1 to 2 days. Associated symptoms include congestion, coughing, headaches, joint pain, a plugged ear sensation, rhinorrhea, sinus pain, sneezing and a sore throat. Pertinent negatives include no abdominal pain, chest pain, diarrhea, dysuria, ear pain, nausea, neck pain, rash, swollen glands, vomiting or wheezing. She has tried acetaminophen (and mucinex DM) for the symptoms.  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 Daily Tobacco and vaping use  Outpatient Medications Prior to Visit  Medication Sig   amphetamine-dextroamphetamine (ADDERALL) 12.5 MG tablet Take 2 tablets by mouth 2 (two) times daily.   ferrous sulfate 325 (65 FE) MG tablet Take 325 mg by mouth daily with breakfast.   lamoTRIgine (LAMICTAL) 200 MG tablet Take 1 tablet (200 mg total) by mouth at bedtime. Take one a day   Levonorgestrel-Ethinyl Estradiol (AMETHIA) 0.15-0.03 &0.01 MG tablet Take 1 tablet by mouth daily.   omeprazole (PRILOSEC) 40 MG capsule Take 1 capsule (40 mg total) by mouth 2 (two) times daily.   risperiDONE (RISPERDAL) 1 MG tablet Take 1 tablet (1 mg total) by mouth at bedtime.   valACYclovir (VALTREX) 1000 MG tablet Take 1 tablet (1,000 mg total) by mouth daily.   venlafaxine XR (EFFEXOR-XR) 75 MG 24 hr capsule TAKE ONE CAPSULE BY MOUTH DAILY  WITH BREAKFAST   vitamin B-12 (CYANOCOBALAMIN) 500 MCG tablet Take 500 mcg by mouth daily.   HYDROcodone-acetaminophen (NORCO) 5-325 MG tablet Take 1 tablet by mouth every 6 (six) hours  as needed for moderate pain. (Patient not taking: Reported on 04/24/2022)   No facility-administered medications prior to visit.   Reviewed past medical and social history.   Review of Systems  Constitutional:  Negative for activity change, appetite change and unexpected weight change.  HENT:  Positive for congestion, rhinorrhea, sinus pain, sneezing and sore throat. Negative for ear pain.   Respiratory:  Positive for cough. Negative for wheezing.   Cardiovascular: Negative.  Negative for chest pain.  Gastrointestinal: Negative.  Negative for abdominal pain, diarrhea, nausea and vomiting.  Endocrine: Negative for cold intolerance and heat intolerance.  Genitourinary: Negative.  Negative for dysuria.  Musculoskeletal:  Positive for joint pain. Negative for neck pain.  Skin: Negative.  Negative for rash.  Neurological:  Positive for headaches.  Hematological: Negative.   Psychiatric/Behavioral:  Negative for behavioral problems, decreased concentration, dysphoric mood, hallucinations, self-injury, sleep disturbance and suicidal ideas. The patient is not nervous/anxious.       Objective:    Physical Exam Constitutional:      General: She is not in acute distress. HENT:     Right Ear: Ear canal and external ear normal. A middle ear effusion is present. There is no impacted cerumen. No foreign body. No mastoid tenderness. Tympanic membrane is not injected, scarred, perforated, erythematous, retracted or bulging.     Left Ear: Ear canal and external ear normal. A middle ear effusion is present. There is no impacted cerumen. No foreign body. No mastoid tenderness.  Tympanic membrane is not injected, scarred, perforated, erythematous, retracted or bulging.     Nose: No nasal tenderness, mucosal edema, congestion or rhinorrhea.     Right Nostril: No occlusion.     Left Nostril: No occlusion.     Right Turbinates: Not enlarged, swollen or pale.     Left Turbinates: Not enlarged, swollen or pale.      Right Sinus: No maxillary sinus tenderness or frontal sinus tenderness.     Left Sinus: No maxillary sinus tenderness or frontal sinus tenderness.     Mouth/Throat:     Pharynx: Oropharynx is clear. Uvula midline.     Tonsils: No tonsillar exudate or tonsillar abscesses.  Eyes:     Extraocular Movements: Extraocular movements intact.     Conjunctiva/sclera: Conjunctivae normal.  Cardiovascular:     Rate and Rhythm: Normal rate and regular rhythm.     Pulses: Normal pulses.     Heart sounds: Normal heart sounds.  Pulmonary:     Effort: Pulmonary effort is normal.     Breath sounds: Normal breath sounds.  Musculoskeletal:     Cervical back: Normal range of motion and neck supple.  Lymphadenopathy:     Cervical: No cervical adenopathy.  Neurological:     Mental Status: She is alert and oriented to person, place, and time.    BP 102/70 (BP Location: Left Arm, Patient Position: Sitting, Cuff Size: Normal)   Pulse (!) 107   Temp 98.5 F (36.9 C) (Oral)   Resp 16   Ht 5' 8.5" (1.74 m)   Wt 196 lb (88.9 kg)   SpO2 98%   BMI 29.37 kg/m    Results for orders placed or performed in visit on 04/24/22  POC COVID-19  Result Value Ref Range   SARS Coronavirus 2 Ag Negative Negative  POCT Influenza A/B  Result Value Ref Range   Influenza A, POC Negative Negative   Influenza B, POC Negative Negative      Assessment & Plan:  Treated based of symptoms and direct exposure. Tamiflu not prescribed due to possible cross reaction with seroquel (caused muscle paralysis). She agreed take xofluza instead.  Problem List Items Addressed This Visit   None Visit Diagnoses     Viral URI with cough    -  Primary   Relevant Medications   Baloxavir Marboxil,80 MG Dose, (XOFLUZA, 80 MG DOSE,) 2 x 40 MG TBPK   Other Relevant Orders   POC COVID-19 (Completed)   POCT Influenza A/B (Completed)   Exposure to influenza       Relevant Medications   Baloxavir Marboxil,80 MG Dose, (XOFLUZA, 80 MG  DOSE,) 2 x 40 MG TBPK      Meds ordered this encounter  Medications   Baloxavir Marboxil,80 MG Dose, (XOFLUZA, 80 MG DOSE,) 2 x 40 MG TBPK    Sig: Take 1 tablet by mouth every morning. X 2days    Dispense:  2 each    Refill:  0    Order Specific Question:   Supervising Provider    Answer:   Libby Maw [[id number]]   Return if symptoms worsen or fail to improve.    Wilfred Lacy, NP

## 2022-04-27 ENCOUNTER — Ambulatory Visit (INDEPENDENT_AMBULATORY_CARE_PROVIDER_SITE_OTHER): Payer: No Typology Code available for payment source | Admitting: Mental Health

## 2022-04-27 DIAGNOSIS — F411 Generalized anxiety disorder: Secondary | ICD-10-CM

## 2022-04-27 DIAGNOSIS — F900 Attention-deficit hyperactivity disorder, predominantly inattentive type: Secondary | ICD-10-CM

## 2022-04-27 DIAGNOSIS — F319 Bipolar disorder, unspecified: Secondary | ICD-10-CM | POA: Diagnosis not present

## 2022-04-27 DIAGNOSIS — F4310 Post-traumatic stress disorder, unspecified: Secondary | ICD-10-CM

## 2022-04-27 NOTE — Progress Notes (Signed)
THERAPIST PROGRESS NOTE Virtual Visit via Video Note  I connected with Kathryn [name] on 04/27/22 at  8:00 AM EDT by a video enabled telemedicine application and verified that I am speaking with the correct person using two identifiers.  Location: Patient: home address on file- in car Provider: office    I discussed the limitations of evaluation and management by telemedicine and the availability of in person appointments. The patient expressed understanding and agreed to proceed.  I discussed the assessment and treatment plan with the patient. The patient was provided an opportunity to ask questions and all were answered. The patient agreed with the plan and demonstrated an understanding of the instructions.   The patient was advised to call back or seek an in-person evaluation if the symptoms worsen or if the condition fails to improve as anticipated.  I provided 53 minutes of non-face-to-face time during this encounter.   Marion Downer, [facility]   Session Time: 8:05am   Participation Level: Active  Behavioral Response: CasualAlertEuthymic  Type of Therapy: Individual Therapy  Treatment Goals addressed: STG: Kathryn [name] will increase management of moods AEB development of effective coping skills with ability to explore and engage in enjoyable activities and self-care weekly withn the next 6 months    STG: Kathryn [name] will process past traumatic events in healthy manner AEB ability to identify and reframe distorted thinking patterns daily as needed within the next 6 months    ProgressTowards Goals: Progressing  Interventions: CBT, Strength-based, and Supportive  Summary:  Kathryn [name] is a 27 y.o. female who presents with dx of bipolar d/o, PTSD, GAD and ADHD. Kathryn [name] presents alert and oriented; mood and affect adequate. Speech clear and coherent at normal rate and tone. Engaged and receptive to interventions. Denies extreme highs or lows. Reports for moods to have been stable.  Shares concerning recent work difficulty and shares abilty to navigate and cope with situation well. Notes increase in self-care and engagement in enjoyable activities with going fishing. Shares ongoing concern for social anxiety but continues to engage with others when able to; denies isolation. Improved communication with family members. Shares has started to engage with a female and shares thoughts and concerns in regards to dating. Shares ongoing insecurities that cause social anxiety and need to continue to work on self-esteem and self-confidence with negative thinking patterns of self. Shares concerns with presenting to school and thoughts of current employment. Increased ability to manage moods noted; denies depression engaging in self-care and enjoyable activities. Denies trauma sxs at this time working to process trauma events and feelings of grief adequately. Denies SI/HI. Progress with goals noted; sxs stability.  Suicidal/Homicidal: Nowithout intent/plan  Therapist Response: Therapist engaged Kathryn [name] in [employer]. Confirmed current location and ability to hold confidential session. Therapist engaged Kathryn [name] in check in and assessed for current level of functioning sxs management. Provided safe space for Kathryn [name] to share thoughts and concerns regarding work and engagement in dating world. Provided supportive feedback and validated concerns. Engaged in guided discovery to explore alternative thoughts with future telling thoughts present. Explored factors that are going well and ability to work though anxious situations. Encouraged ongoing work on self-esteem and self-confidence and engagement in positive affirmations to support. Assessed for trauma related sxs. Explored factors in which need ongoing support. Reviewed session and provided follow up appointment. No safety concerns noted.   Plan: Return again in  x 4 weeks.  Diagnosis: Bipolar 1 disorder, depressed (HCC)  Post-traumatic  stress  GAD (generalized anxiety  disorder)  Attention deficit hyperactivity disorder (ADHD), predominantly inattentive type  Collaboration of Care: Other None  Patient/Guardian was advised Release of Information must be obtained prior to any record release in order to collaborate their care with an outside provider. Patient/Guardian was advised if they have not already done so to contact the registration department to sign all necessary forms in order for [name] to release information regarding their care.   Consent: Patient/Guardian gives verbal consent for treatment and assignment of benefits for services provided during this visit. Patient/Guardian expressed understanding and agreed to proceed.   Kathryn [name] [place], [facility] 04/27/2022

## 2022-04-30 ENCOUNTER — Telehealth (HOSPITAL_COMMUNITY): Payer: Self-pay | Admitting: *Deleted

## 2022-04-30 NOTE — Telephone Encounter (Signed)
VM left from patient needing her Adderall and Effexor. Checked chart and she should have a supply. Confirmed with [facility] pharmacy she has a rx for her effexor from 3/1 she can pick up and she is on day 25 of her adderall so too soon to pick up. She also needs to make an appt with her provider to cont tx. She was called and notified of the above.

## 2022-05-06 ENCOUNTER — Ambulatory Visit: Payer: No Typology Code available for payment source | Admitting: Family Medicine

## 2022-05-06 ENCOUNTER — Encounter: Payer: Self-pay | Admitting: Family Medicine

## 2022-05-06 VITALS — BP 121/75 | HR 86 | Temp 98.1°F | Wt 194.0 lb

## 2022-05-06 DIAGNOSIS — S39012A Strain of muscle, fascia and tendon of lower back, initial encounter: Secondary | ICD-10-CM | POA: Diagnosis not present

## 2022-05-06 MED ORDER — CYCLOBENZAPRINE HCL 10 MG PO TABS: 10.0000 mg | ORAL_TABLET | Freq: Two times a day (BID) | ORAL | 0 refills | Status: DC

## 2022-05-06 MED ORDER — NAPROXEN 500 MG PO TABS: 500.0000 mg | ORAL_TABLET | Freq: Two times a day (BID) | ORAL | 0 refills | Status: DC

## 2022-05-06 NOTE — Progress Notes (Signed)
Kathryn [name] , 1995-04-03, 27 y.o., female MRN: [id number] Patient Care Team    Relationship Specialty Notifications Start End  Mosie Lukes, MD PCP - General Family Medicine  01/02/15     Chief Complaint  Patient presents with   Mamie Levers around 5     Subjective: Kathryn [name] is a 27 y.o. Pt presents for an OV with complaints of left sided back pain after fall yesterday 5pm.  Associated symptoms include pain is worse with side bending.  She has never had to have a back surgery in the past.  She has strained her back in the past due to weight lifting.  She reports yesterday she is playing with the kids and jumped on a mat that moved on her, causing her to jerk and lose her balance.  She reports her back was fine initially, however when she woke up this morning she noticed left-sided low back discomfort.  She states this does not radiate to her buttocks or lower extremity.  Pt has tried advil , heat to ease their symptoms.      04/24/2022   11:25 AM 01/16/2022    8:19 AM 05/27/2021    3:41 PM 03/13/2021    8:48 AM 12/19/2020    8:48 AM  Depression screen PHQ 2/9  Decreased Interest 0      Down, Depressed, Hopeless 0      PHQ - 2 Score 0      Altered sleeping 2      Tired, decreased energy 0      Change in appetite 0      Feeling bad or failure about yourself  0      Trouble concentrating 0      Moving slowly or fidgety/restless 0      Suicidal thoughts 0      PHQ-9 Score 2      Difficult doing work/chores Not difficult at all         Information is confidential and restricted. Go to Review Flowsheets to unlock data.    Allergies  Allergen Reactions   Oxycodone Nausea Only, Nausea And Vomiting and Other (See Comments)    Oxycontin also Oxycontin also Oxycontin also Oxycontin also    Quetiapine Fumarate Other (See Comments)    Mental status changes Mental status changes Mental status changes Mental status changes    Social History   Social  History Narrative   Not on file   Past Medical History:  Diagnosis Date   Abnormal liver function tests 02/02/2016   ADHD 06/10/2016   Alcohol use 02/23/2016   Allergy    Anxiety    Arm fracture, right    as child, fx in three places, no surgery   Depression    Depression with anxiety 2014/04/30   PTSD s/p death of best friend    Essential hypertension 02/02/2016   GERD (gastroesophageal reflux disease)    H/O cold sores 04/15/2015   Headache 04/09/2016   Hyperlipidemia, mixed 02/02/2016   Migraine    Ovarian cyst    PCOS (polycystic ovarian syndrome)    Preventative health care 10/27/2015   Thrombocytosis 04/09/2016   Vaginal Pap smear, abnormal    HGSIL   Past Surgical History:  Procedure Laterality Date   ADENOIDECTOMY  2002   TYMPANOSTOMY TUBE PLACEMENT Bilateral 1999   Family History  Problem Relation Age of Onset   Hypertension Mother    Heart disease Mother  CAD, stent at 23, s/p cardiac arrest with Defib   Hyperlipidemia Mother    Diabetes Mother        s/p gastric bypass   Obesity Mother        s/p gastric bypass   OCD Mother    Hyperlipidemia Father    Hypertension Father    Anxiety disorder Father    Asthma Sister    Hyperlipidemia Brother    Anxiety disorder Brother    Stroke Maternal Grandmother    Thyroid disease Maternal Grandmother    Anxiety disorder Maternal Grandmother    Heart disease Maternal Grandfather        MI first at 90, s/p stents and CABG   Hyperlipidemia Maternal Grandfather    Hypertension Maternal Grandfather    Stroke Maternal Grandfather    Cancer Maternal Grandfather        melanoma   Aneurysm Maternal Grandfather        brain   Kidney disease Maternal Grandfather        tumor   Heart disease Paternal Grandmother    Cancer Paternal Grandfather        ung and brain   Heart disease Maternal Uncle    Vision loss Maternal Uncle    Anxiety disorder Maternal Uncle    Anxiety disorder Maternal Aunt    Depression Maternal  Aunt    Allergies as of 05/06/2022       Reactions   Oxycodone Nausea Only, Nausea And Vomiting, Other (See Comments)   Oxycontin also Oxycontin also Oxycontin also Oxycontin also   Quetiapine Fumarate Other (See Comments)   Mental status changes Mental status changes Mental status changes Mental status changes        Medication List        Accurate as of May 06, 2022 11:27 AM. If you have any questions, ask your nurse or doctor.          amphetamine-dextroamphetamine 12.5 MG tablet Commonly known as: Adderall Take 2 tablets by mouth 2 (two) times daily.   cyanocobalamin 500 MCG tablet Commonly known as: VITAMIN B12 Take 500 mcg by mouth daily.   ferrous sulfate 325 (65 FE) MG tablet Take 325 mg by mouth daily with breakfast.   HYDROcodone-acetaminophen 5-325 MG tablet Commonly known as: Norco Take 1 tablet by mouth every 6 (six) hours as needed for moderate pain.   lamoTRIgine 200 MG tablet Commonly known as: LAMICTAL Take 1 tablet (200 mg total) by mouth at bedtime. Take one a day   Levonorgestrel-Ethinyl Estradiol 0.15-0.03 &0.01 MG tablet Commonly known as: AMETHIA Take 1 tablet by mouth daily.   omeprazole 40 MG capsule Commonly known as: PRILOSEC Take 1 capsule (40 mg total) by mouth 2 (two) times daily.   risperiDONE 1 MG tablet Commonly known as: RisperDAL Take 1 tablet (1 mg total) by mouth at bedtime.   valACYclovir 1000 MG tablet Commonly known as: VALTREX Take 1 tablet (1,000 mg total) by mouth daily.   venlafaxine XR 75 MG 24 hr capsule Commonly known as: EFFEXOR-XR TAKE ONE CAPSULE BY MOUTH DAILY  WITH BREAKFAST   Xofluza (80 MG Dose) 2 x 40 MG Tbpk Generic drug: Baloxavir Marboxil(80 MG Dose) Take 1 tablet by mouth every morning. X 2days        All past medical history, surgical history, allergies, family history, immunizations andmedications were updated in the EMR today and reviewed under the history and medication portions  of their EMR.     ROS  Negative, with the exception of above mentioned in HPI   Objective:  BP 121/75   Pulse 86   Temp 98.1 F (36.7 C)   Wt 194 lb (88 kg)   SpO2 100%   BMI 29.07 kg/m  Body mass index is 29.07 kg/m. Physical Exam Vitals and nursing note reviewed.  Constitutional:      General: She is not in acute distress.    Appearance: Normal appearance. She is normal weight. She is not ill-appearing or toxic-appearing.  HENT:     Head: Normocephalic and atraumatic.  Eyes:     General: No scleral icterus.       Right eye: No discharge.        Left eye: No discharge.     Extraocular Movements: Extraocular movements intact.     Conjunctiva/sclera: Conjunctivae normal.     Pupils: Pupils are equal, round, and reactive to light.  Musculoskeletal:     Cervical back: Normal.     Thoracic back: Normal.     Lumbar back: Spasms and tenderness present. No swelling or bony tenderness. Decreased range of motion. Negative right straight leg raise test and negative left straight leg raise test.       Back:     Comments: Lumbar spine: No bony tenderness.  No SI joint pain.  Hypertropic lower lumbar paraspinal muscles.  Full range of motion with discomfort on bilateral sidebending and bilateral rotation.  Neurovascular intact distally.  Skin:    Findings: No rash.  Neurological:     Mental Status: She is alert and oriented to person, place, and time. Mental status is at baseline.     Motor: No weakness.     Coordination: Coordination normal.     Gait: Gait normal.  Psychiatric:        Mood and Affect: Mood normal.        Behavior: Behavior normal.        Thought Content: Thought content normal.        Judgment: Judgment normal.      No results found. No results found. No results found for this or any previous visit (from the past 24 hour(s)).  Assessment/Plan: Angeleah Hallaway is a 27 y.o. female present for OV for  1. Lumbar strain, initial encounter Rest.  This first 48  to 72 hours, then heat application can be helpful. Naproxen twice daily with food prescribed.  Take as scheduled for the first 5 to 7 days, then can decrease as needed. Flexeril twice daily as needed with sedation precaution. Follow-up in 2-4 weeks if symptoms or not improving with PCP.   Reviewed expectations re: course of current medical issues. Discussed self-management of symptoms. Outlined signs and symptoms indicating need for more acute intervention. Patient verbalized understanding and all questions were answered. Patient received an After-Visit Summary.    No orders of the defined types were placed in this encounter.  No orders of the defined types were placed in this encounter.  Referral Orders  No referral(s) requested today     Note is dictated utilizing voice recognition software. Although note has been proof read prior to signing, occasional typographical errors still can be missed. If any questions arise, please do not hesitate to call for verification.   electronically signed by:  Howard Pouch, DO  [place]

## 2022-05-11 DIAGNOSIS — Z20822 Contact with and (suspected) exposure to covid-19: Secondary | ICD-10-CM | POA: Diagnosis not present

## 2022-05-11 DIAGNOSIS — J039 Acute tonsillitis, unspecified: Secondary | ICD-10-CM | POA: Diagnosis not present

## 2022-05-25 ENCOUNTER — Encounter (HOSPITAL_COMMUNITY): Payer: Self-pay | Admitting: Student in an Organized Health Care Education/Training Program

## 2022-05-25 ENCOUNTER — Ambulatory Visit (INDEPENDENT_AMBULATORY_CARE_PROVIDER_SITE_OTHER): Payer: No Typology Code available for payment source | Admitting: Mental Health

## 2022-05-25 ENCOUNTER — Telehealth (INDEPENDENT_AMBULATORY_CARE_PROVIDER_SITE_OTHER)
Payer: No Typology Code available for payment source | Admitting: Student in an Organized Health Care Education/Training Program

## 2022-05-25 DIAGNOSIS — F411 Generalized anxiety disorder: Secondary | ICD-10-CM

## 2022-05-25 DIAGNOSIS — F319 Bipolar disorder, unspecified: Secondary | ICD-10-CM | POA: Diagnosis not present

## 2022-05-25 DIAGNOSIS — F900 Attention-deficit hyperactivity disorder, predominantly inattentive type: Secondary | ICD-10-CM

## 2022-05-25 DIAGNOSIS — F4310 Post-traumatic stress disorder, unspecified: Secondary | ICD-10-CM

## 2022-05-25 MED ORDER — LAMOTRIGINE 200 MG PO TABS: 200.0000 mg | ORAL_TABLET | Freq: Every day | ORAL | 1 refills | Status: DC

## 2022-05-25 MED ORDER — RISPERIDONE 1 MG PO TABS: 1.0000 mg | ORAL_TABLET | Freq: Every day | ORAL | 1 refills | Status: DC

## 2022-05-25 MED ORDER — AMPHETAMINE-DEXTROAMPHETAMINE 12.5 MG PO TABS: 25.0000 mg | ORAL_TABLET | Freq: Two times a day (BID) | ORAL | 0 refills | Status: DC

## 2022-05-25 MED ORDER — VENLAFAXINE HCL ER 75 MG PO CP24: ORAL_CAPSULE | ORAL | 1 refills | Status: DC

## 2022-05-25 NOTE — Progress Notes (Signed)
BH MD/PA/NP OP Progress Note  Virtual Visit via Video Note  I connected with Kathryn [name] on 05/25/22 at  2:30 PM EDT by a video enabled telemedicine application and verified that I am speaking with the correct person using two identifiers.  Location: Patient: Work [facility]   I discussed the limitations of evaluation and management by telemedicine and the availability of in person appointments. The patient expressed understanding and agreed to proceed.  05/25/2022 2:46 PM Kathryn [name]  MRN:  [id number]  Chief Complaint:  Chief Complaint  Patient presents with   Follow-up   Medication Refill   HPI:  Kathryn [name] is a 27 yr old female who presents via Virtual Video Visit for Follow Up and Medication Management. PPHx is significant for Bipolar Disorder, GAD, PTSD, and ADHD.  She reports that things have been going well since her last appointment.  She reports that her mood has been stable.  She reports that actually been the most stable it has been in a while.  She reports her attention and focus are doing better.  She reports no side effects to her medications.  She reports she continues to have the occasional issue with her social anxiety flaring up.  She reports that it is not hindering her ability to do her day-to-day things that need to be done and thinks that it will continue to get better with time.  She reports that her sleep has been fair the last little bit but that she did discuss this with her therapist and will be working on improving her sleep hygiene to tackle this.  Discussed with her that since she was doing stable we would not make any changes to her medications at this time she was agreeable with this.  She asked about smoking cessation aids and discussed wanting 1-800-quit-now with her.  Discussed that they have coaches and can send samples of nicotine gum or patches she reports she will look into them.  She reports no SI, HI, or AVH.  She reports her sleep is fair.  She  reports her appetite is good.  She reports no other concerns at present.  She will return for follow-up in approximately 2 months.  Discussed with patient that Resident Provider would be transitioning their care to another Resident Provider starting July 2024.  She reported understanding and had no concerns.    Visit Diagnosis:    ICD-10-CM   1. Bipolar 1 disorder, depressed  F31.9 risperiDONE (RISPERDAL) 1 MG tablet    venlafaxine XR (EFFEXOR-XR) 75 MG 24 hr capsule    lamoTRIgine (LAMICTAL) 200 MG tablet    2. GAD (generalized anxiety disorder)  F41.1 venlafaxine XR (EFFEXOR-XR) 75 MG 24 hr capsule    3. Attention deficit hyperactivity disorder (ADHD), predominantly inattentive type  F90.0 amphetamine-dextroamphetamine (ADDERALL) 12.5 MG tablet      Past Psychiatric History: Bipolar Disorder, GAD, PTSD, and ADHD.   Past Medical History:  Past Medical History:  Diagnosis Date   Abnormal liver function tests 02/02/2016   ADHD 06/10/2016   Alcohol use 02/23/2016   Allergy    Anxiety    Arm fracture, right    as child, fx in three places, no surgery   Depression    Depression with anxiety 04/11/2014   PTSD s/p death of best friend    Essential hypertension 02/02/2016   GERD (gastroesophageal reflux disease)    H/O cold sores 04/15/2015   Headache 04/09/2016   Hyperlipidemia, mixed 02/02/2016   Migraine  Ovarian cyst    PCOS (polycystic ovarian syndrome)    Preventative health care 10/27/2015   Thrombocytosis 04/09/2016   Vaginal Pap smear, abnormal    HGSIL    Past Surgical History:  Procedure Laterality Date   ADENOIDECTOMY  2002   TYMPANOSTOMY TUBE PLACEMENT Bilateral 1999    Family Psychiatric History: Maternal Aunt- Anxiety Maternal Grandfather- Anxiety Mother- Anxiety and OCD  Family History:  Family History  Problem Relation Age of Onset   Hypertension Mother    Heart disease Mother        CAD, stent at 62, s/p cardiac arrest with Defib   Hyperlipidemia  Mother    Diabetes Mother        s/p gastric bypass   Obesity Mother        s/p gastric bypass   OCD Mother    Hyperlipidemia Father    Hypertension Father    Anxiety disorder Father    Asthma Sister    Hyperlipidemia Brother    Anxiety disorder Brother    Stroke Maternal Grandmother    Thyroid disease Maternal Grandmother    Anxiety disorder Maternal Grandmother    Heart disease Maternal Grandfather        MI first at 72, s/p stents and CABG   Hyperlipidemia Maternal Grandfather    Hypertension Maternal Grandfather    Stroke Maternal Grandfather    Cancer Maternal Grandfather        melanoma   Aneurysm Maternal Grandfather        brain   Kidney disease Maternal Grandfather        tumor   Heart disease Paternal Grandmother    Cancer Paternal Grandfather        ung and brain   Heart disease Maternal Uncle    Vision loss Maternal Uncle    Anxiety disorder Maternal Uncle    Anxiety disorder Maternal Aunt    Depression Maternal Aunt     Social History:  Social History   Socioeconomic History   Marital status: Single    Spouse name: Not on file   Number of children: 0   Years of education: 12   Highest education level: Not on file  Occupational History    Comment: [employer]  Tobacco Use   Smoking status: Heavy Smoker    Packs/day: 1.5    Types: Cigarettes   Smokeless tobacco: Former    Types: Snuff  Vaping Use   Vaping Use: Never used  Substance and Sexual Activity   Alcohol use: Yes    Comment: maybe every 6 months   Drug use: Yes    Types: Marijuana    Comment: daily   Sexual activity: Yes    Partners: Male    Birth control/protection: Pill  Other Topics Concern   Not on file  Social History Narrative   Not on file   Social Determinants of Health   Financial Resource Strain: Medium Risk (01/16/2022)   Overall Financial Resource Strain (CARDIA)    Difficulty of Paying Living Expenses: Somewhat hard  Food Insecurity: No Food Insecurity  (01/16/2022)   Hunger Vital Sign    Worried About Running Out of Food in the Last Year: Never true    Ran Out of Food in the Last Year: Never true  Transportation Needs: No Transportation Needs (01/16/2022)   PRAPARE - Administrator, Civil Service (Medical): No    Lack of Transportation (Non-Medical): No  Physical Activity: Inactive (01/16/2022)   Exercise Vital  Sign    Days of Exercise per Week: 0 days    Minutes of Exercise per Session: 0 min  Stress: No Stress Concern Present (01/16/2022)   [employer] of Occupational Health - Occupational Stress Questionnaire    Feeling of Stress : Only a little  Social Connections: Socially Isolated (01/16/2022)   Social Connection and Isolation Panel [NHANES]    Frequency of Communication with Friends and Family: More than three times a week    Frequency of Social Gatherings with Friends and Family: Never    Attends Religious Services: Never    Database administrator or Organizations: No    Attends Banker Meetings: Never    Marital Status: Never married    Allergies:  Allergies  Allergen Reactions   Oxycodone Nausea Only, Nausea And Vomiting and Other (See Comments)    Oxycontin also Oxycontin also Oxycontin also Oxycontin also    Quetiapine Fumarate Other (See Comments)    Mental status changes Mental status changes Mental status changes Mental status changes     Metabolic Disorder Labs: Lab Results  Component Value Date   HGBA1C 5.2 11/17/2018   No results found for: "PROLACTIN" Lab Results  Component Value Date   CHOL 222 (H) 06/18/2021   TRIG 72.0 06/18/2021   HDL 56.90 06/18/2021   CHOLHDL 4 06/18/2021   VLDL 14.4 06/18/2021   LDLCALC 150 (H) 06/18/2021   LDLCALC 166 (H) 11/17/2018   Lab Results  Component Value Date   TSH 0.735 11/17/2018   TSH 2.49 05/05/2017    Therapeutic Level Labs: No results found for: "LITHIUM" No results found for: "VALPROATE" No results found for:  "CBMZ"  Current Medications: Current Outpatient Medications  Medication Sig Dispense Refill   amphetamine-dextroamphetamine (ADDERALL) 12.5 MG tablet Take 2 tablets by mouth 2 (two) times daily. 120 tablet 0   Baloxavir Marboxil,80 MG Dose, (XOFLUZA, 80 MG DOSE,) 2 x 40 MG TBPK Take 1 tablet by mouth every morning. X 2days (Patient not taking: Reported on 05/06/2022) 2 each 0   cyclobenzaprine (FLEXERIL) 10 MG tablet Take 1 tablet (10 mg total) by mouth every 12 (twelve) hours. 30 tablet 0   ferrous sulfate 325 (65 FE) MG tablet Take 325 mg by mouth daily with breakfast.     HYDROcodone-acetaminophen (NORCO) 5-325 MG tablet Take 1 tablet by mouth every 6 (six) hours as needed for moderate pain. (Patient not taking: Reported on 04/24/2022) 30 tablet 0   lamoTRIgine (LAMICTAL) 200 MG tablet Take 1 tablet (200 mg total) by mouth at bedtime. Take one a day 30 tablet 1   Levonorgestrel-Ethinyl Estradiol (AMETHIA) 0.15-0.03 &0.01 MG tablet Take 1 tablet by mouth daily. 91 tablet 4   naproxen (NAPROSYN) 500 MG tablet Take 1 tablet (500 mg total) by mouth 2 (two) times daily with a meal. 30 tablet 0   omeprazole (PRILOSEC) 40 MG capsule Take 1 capsule (40 mg total) by mouth 2 (two) times daily. 180 capsule 3   risperiDONE (RISPERDAL) 1 MG tablet Take 1 tablet (1 mg total) by mouth at bedtime. 30 tablet 1   valACYclovir (VALTREX) 1000 MG tablet Take 1 tablet (1,000 mg total) by mouth daily. 90 tablet 4   venlafaxine XR (EFFEXOR-XR) 75 MG 24 hr capsule TAKE ONE CAPSULE BY MOUTH DAILY  WITH BREAKFAST 30 capsule 1   vitamin B-12 (CYANOCOBALAMIN) 500 MCG tablet Take 500 mcg by mouth daily.     No current facility-administered medications for this visit.  Musculoskeletal: Strength & Muscle Tone: within normal limits Gait & Station:  Sitting During Interview Patient leans: N/A  Psychiatric Specialty Exam: Review of Systems  Respiratory:  Negative for shortness of breath.   Cardiovascular:  Negative  for chest pain.  Gastrointestinal:  Negative for abdominal pain, constipation, diarrhea, nausea and vomiting.  Neurological:  Negative for dizziness, weakness and headaches.  Psychiatric/Behavioral:  Negative for dysphoric mood, hallucinations, sleep disturbance and suicidal ideas. The patient is not nervous/anxious.     There were no vitals taken for this visit.There is no height or weight on file to calculate BMI.  General Appearance: Casual and Fairly Groomed  Eye Contact:  Good  Speech:  Clear and Coherent and Normal Rate  Volume:  Normal  Mood:  Euthymic  Affect:  Appropriate and Congruent  Thought Process:  Coherent and Goal Directed  Orientation:  Full (Time, Place, and Person)  Thought Content: WDL and Logical   Suicidal Thoughts:  No  Homicidal Thoughts:  No  Memory:  Immediate;   Good Recent;   Good  Judgement:  Good  Insight:  Good  Psychomotor Activity:  Normal  Concentration:  Concentration: Good and Attention Span: Good  Recall:  Good  Fund of Knowledge: Good  Language: Good  Akathisia:  Negative  Handed:  Right  AIMS (if indicated): not done  Assets:  Communication Skills Desire for Improvement Housing Physical Health Resilience  ADL's:  Intact  Cognition: WNL  Sleep:  Fair   Screenings: AUDIT    Advertising copywriter from [facility]/16/2022 in [facility]  Alcohol Use Disorder Identification Test Final Score (AUDIT) 8      GAD-7    Flowsheet Row Office Visit from 04/24/2022 in [facility] from 01/16/2022 in [facility] Video Visit from 05/27/2021 in [facility] Video Visit from 03/13/2021 in [facility] Video Visit from 12/19/2020 in [facility]  Total GAD-7 Score 0 7 3 1 2       PHQ2-9    Flowsheet Row Office Visit from 04/24/2022 in [facility]  [place] Counselor from 01/16/2022 in [facility] Video Visit from 05/27/2021 in [facility] Video Visit from 03/13/2021 in [facility] Video Visit from 12/19/2020 in [facility]  PHQ-2 Total Score 0 5 3 2 1   PHQ-9 Total Score 2 14 6 7 2       Flowsheet Row Counselor from 01/16/2022 in [facility] Video Visit from 05/27/2021 in [facility]  C-SSRS RISK CATEGORY Low Risk No Risk        Assessment and Plan:  Amaria Mundorf is a 27 yr old female who presents via Virtual Video Visit for Follow Up and Medication Management. PPHx is significant for Bipolar Disorder, GAD, PTSD, and ADHD.   Ashana has continued to do well on her medication regimen without side effects.  We will not make any changes to her medication at this time.  Refills were sent in.  She will return for follow-up in approximately 2 months.   Bipolar 1 disorder, depressed  GAD -Continue Effexor XR 75 mg daily for depression and anxiety.  30 tablets with 1 refill. -Continue Risperdal 1 mg QHS for mood stability.  30 tablets with 1 refill. -Continue Lamictal 200 mg QHS for mood stability.  30 tablets  with 1 refill.     ADHD -Continue Adderall 20 mg BID.  120 (12.5 mg) tablets with 0 refills.  Last fill 3/20    Collaboration of Care: Collaboration of Care: Other provider involved in patient's care AEB [facility] Therapist  Patient/Guardian was advised Release of Information must be obtained prior to any record release in order to collaborate their care with an outside provider. Patient/Guardian was advised if they have not already done so to contact the registration department to sign all necessary forms in order for [name] to release information regarding their care.   Consent: Patient/Guardian gives verbal consent for treatment and assignment of benefits for  services provided during this visit. Patient/Guardian expressed understanding and agreed to proceed.    Lauro Franklin, MD 05/25/2022, 2:46 PM   Follow Up Instructions:    I discussed the assessment and treatment plan with the patient. The patient was provided an opportunity to ask questions and all were answered. The patient agreed with the plan and demonstrated an understanding of the instructions.   The patient was advised to call back or seek an in-person evaluation if the symptoms worsen or if the condition fails to improve as anticipated.  I provided 11 minutes of non-face-to-face time during this encounter.   Lauro Franklin, MD

## 2022-05-25 NOTE — Progress Notes (Signed)
THERAPIST PROGRESS NOTE Virtual Visit via Video Note  I connected with Kathryn [name] on 05/25/22 at  8:00 AM EDT by a video enabled telemedicine application and verified that I am speaking with the correct person using two identifiers.  Location: Patient: home address on file Provider: office    I discussed the limitations of evaluation and management by telemedicine and the availability of in person appointments. The patient expressed understanding and agreed to proceed.  I discussed the assessment and treatment plan with the patient. The patient was provided an opportunity to ask questions and all were answered. The patient agreed with the plan and demonstrated an understanding of the instructions.   The patient was advised to call back or seek an in-person evaluation if the symptoms worsen or if the condition fails to improve as anticipated.  I provided 54 minutes of non-face-to-face time during this encounter.   Kathryn [name], [facility]   Session Time: 8:04am ( 54 minutes)  Participation Level: Active  Behavioral Response: CasualAlertWNL  Type of Therapy: Individual Therapy  Treatment Goals addressed:  STG: Revia will increase management of moods AEB development of effective coping skills with ability to explore and engage in enjoyable activities and self-care weekly withn the next 6 months   STG: Kathryn [name] will process past traumatic events in healthy manner AEB ability to identify and reframe distorted thinking patterns daily as needed within the next 6 months      ProgressTowards Goals: Progressing  Interventions: CBT and Supportive  Summary: Kathryn [name] is a 27 y.o. female who presents with dx of bipolar d/o, PTSD, GAD and ADHD. Kathryn [name] presents alert and oriented; mood and affect adequate. Speech clear and coherent at normal rate and tone. Engaged and receptive to interventions. Shares for moods to have been stable and shares for desire to start making goals and  plans for her future. Notes feelings of being 'stationary." Shares desire to present to school and explore need to move from current residence. Shares concern for her current drinking behaviors and reports to drink daily if not every other day of a few shows up to pint of liquor. Notes drinks due to difficulty falling asleep as well as 'something to do.' Shares to have done this every day /every other day for months and concern for withdrawal sxs. Shares will get off work and purchase alcohol. Engages with therapist to explore sleep hygiene and working to identify avoidance of feelings. Receptive of education on signs of withdrawals and ability to engage in detox if needed. Agrees to work to process feelings and emotions though journaling along with taking note of any sxs she experiences from ceasing drinking. Denies desire to engage with AA at this time. Agrees to follow up with medication provider for medications to support in refraining from use. Denies current safety concerns. Progress with goals with engagement in enjoyable activities; onoging work to engage in prosocial coping.    Suicidal/Homicidal: Nowithout intent/plan  Therapist Response: Therapist engaged Kathryn [name] in [employer]. Confirmed current location and ability to hold confidential session. Therapist engaged Kathryn [name] in check in and assessed for current level of functioning sxs management. Provided safe space for Kathryn [name] to share thoughts and concerns regarding work stressors. Provided supportive feedback. Engaged Kathryn [name] in assessing for level of concern for drinking behaviors and internal motivation to cease drinking. Assessed for amount and frequency of use. Educated on withdrawal sxs and possible need to present to detox. Educated on [employer]. Explored desire to attend to [employer] Encouraged  exploring sleep medications with provider and educated on sleep hygiene and healthy habits to support with sleep. Explored ability to sit with  emotions and explored being alone vs. Loneliness. Encouraged working to sit with emotions and safety concerns of ceasing alcohol. Educated on antabuse medications to discuss with provider. Reviewed session, provided follow up and assessed for safety.   Plan: Return again in  x 4 weeks.  Diagnosis: Bipolar 1 disorder, depressed  Post-traumatic stress  Attention deficit hyperactivity disorder (ADHD), predominantly inattentive type  Collaboration of Care: Other None  Patient/Guardian was advised Release of Information must be obtained prior to any record release in order to collaborate their care with an outside provider. Patient/Guardian was advised if they have not already done so to contact the registration department to sign all necessary forms in order for [name] to release information regarding their care.   Consent: Patient/Guardian gives verbal consent for treatment and assignment of benefits for services provided during this visit. Patient/Guardian expressed understanding and agreed to proceed.   Kathryn [name], Kathryn [name] 05/25/2022

## 2022-05-28 ENCOUNTER — Encounter (HOSPITAL_COMMUNITY): Payer: Self-pay

## 2022-06-08 ENCOUNTER — Encounter: Payer: Self-pay | Admitting: Family Medicine

## 2022-06-08 ENCOUNTER — Ambulatory Visit: Payer: No Typology Code available for payment source | Admitting: Family Medicine

## 2022-06-08 VITALS — BP 129/82 | HR 75 | Temp 97.6°F | Ht 68.5 in | Wt 204.0 lb

## 2022-06-08 DIAGNOSIS — R519 Headache, unspecified: Secondary | ICD-10-CM

## 2022-06-08 DIAGNOSIS — J3489 Other specified disorders of nose and nasal sinuses: Secondary | ICD-10-CM | POA: Diagnosis not present

## 2022-06-08 LAB — POC COVID19 BINAXNOW: SARS Coronavirus 2 Ag: NEGATIVE

## 2022-06-08 MED ORDER — AMOXICILLIN-POT CLAVULANATE 875-125 MG PO TABS: 1.0000 | ORAL_TABLET | Freq: Two times a day (BID) | ORAL | 0 refills | Status: DC

## 2022-06-08 MED ORDER — PREDNISONE 20 MG PO TABS: 40.0000 mg | ORAL_TABLET | Freq: Every day | ORAL | 0 refills | Status: AC

## 2022-06-08 NOTE — Progress Notes (Signed)
Acute Office Visit  Subjective:     Patient ID: Kathryn [name], female    DOB: 1995-10-24, 27 y.o.   MRN: [id number]  Chief Complaint  Patient presents with   Sinus Problem       Upper Respiratory Infection: Patient complains of symptoms of a URI. Symptoms include  headache, 5/10 frontal and ethmoid sinus pressure, nasal congestion, occasional cough and chills. She has been having some allergy symptoms the past few weeks as well, but this pain has been more severe than her usual allergies . Onset of symptoms was 3 days ago, unchanged since that time.  She is drinking plenty of fluids. Evaluation to date: none. Treatment to date:  Mucinex - didn't help at all  . She denies rhinorrhea, sore throat, sneezing, ear pain, chest pain, dyspnea, wheezing, fevers. 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      ROS All review of systems negative except what is listed in the HPI      Objective:    BP 129/82   Pulse 75   Temp 97.6 F (36.4 C) (Oral)   Ht 5' 8.5" (1.74 m)   Wt 204 lb (92.5 kg)   SpO2 100%   BMI 30.57 kg/m    Physical Exam Vitals reviewed.  Constitutional:      Appearance: Normal appearance.  HENT:     Head: Normocephalic and atraumatic.     Comments: Frontal and ethmoid sinuses very tender to palpation with mild visible inflammation     Right Ear: Tympanic membrane normal.     Left Ear: Tympanic membrane normal.     Nose: No congestion or rhinorrhea.     Mouth/Throat:     Mouth: Mucous membranes are moist.     Pharynx: No oropharyngeal exudate or posterior oropharyngeal erythema.  Eyes:     Conjunctiva/sclera: Conjunctivae normal.  Cardiovascular:     Rate and Rhythm: Normal rate and regular rhythm.     Pulses: Normal pulses.     Heart sounds: Normal heart sounds.  Pulmonary:     Effort: Pulmonary effort is normal.     Breath sounds: Normal breath sounds. No wheezing, rhonchi or rales.  Musculoskeletal:     Cervical back: Normal range  of motion and neck supple. No tenderness.  Lymphadenopathy:     Cervical: No cervical adenopathy.  Skin:    General: Skin is warm and dry.  Neurological:     Mental Status: She is alert and oriented to person, place, and time.  Psychiatric:        Mood and Affect: Mood normal.        Behavior: Behavior normal.        Thought Content: Thought content normal.        Judgment: Judgment normal.     Results for orders placed or performed in visit on 06/08/22  POC COVID-19 BinaxNow  Result Value Ref Range   SARS Coronavirus 2 Ag Negative Negative        Assessment & Plan:   Problem List Items Addressed This Visit   None Visit Diagnoses     Sinus headache    -  Primary   Relevant Orders   POC COVID-19 BinaxNow (Completed)   Sinus pressure       Relevant Medications   predniSONE (DELTASONE) 20 MG tablet   amoxicillin-clavulanate (AUGMENTIN) 875-125 MG tablet   Other Relevant Orders   POC COVID-19 BinaxNow (Completed)     COVID testing: negative  Start prednisone burst, continue allergy medications.  Continue supportive measures including rest, hydration, humidifier use, steam showers, warm compresses to sinuses, warm liquids with lemon and honey, and over-the-counter cough, cold, and analgesics as needed.  If not improving by the end of the week or if you feel significantly worse before then, start antibiotics - sending in Augmentin.  Patient aware of signs/symptoms requiring further/urgent evaluation.       Meds ordered this encounter  Medications   predniSONE (DELTASONE) 20 MG tablet    Sig: Take 2 tablets (40 mg total) by mouth daily with breakfast for 5 days.    Dispense:  10 tablet    Refill:  0    Order Specific Question:   Supervising Provider    Answer:   Danise Edge A [[id number]]   amoxicillin-clavulanate (AUGMENTIN) 875-125 MG tablet    Sig: Take 1 tablet by mouth 2 (two) times daily.    Dispense:  20 tablet    Refill:  0    Order Specific Question:    Supervising Provider    Answer:   Danise Edge A [[id number]]    Return if symptoms worsen or fail to improve.  Clayborne Dana, NP

## 2022-06-08 NOTE — Patient Instructions (Signed)
COVID testing: negative  Start prednisone burst, continue allergy medications.  Continue supportive measures including rest, hydration, humidifier use, steam showers, warm compresses to sinuses, warm liquids with lemon and honey, and over-the-counter cough, cold, and analgesics as needed.  If not improving by the end of the week or if you feel significantly worse before then, start antibiotics - sending in Augmentin.   Please contact office for follow-up if symptoms do not improve or worsen. Seek emergency care if symptoms become severe.   The following information is provided as a Counsellor for ADULT patients only and does NOT take into account PREGNANCY, ALLERGIES, LIVER CONDITIONS, KIDNEY CONDITIONS, GASTROINTESTINAL CONDITIONS, OR PRESCRIPTION MEDICATION INTERACTIONS. Please be sure to ask your provider if the following are safe to take with your specific medical history, conditions, or current medication regimen if you are unsure.   Adult Basic Symptom Management for Sinusitis  Congestion: Guaifenesin (Mucinex)- follow directions on packaging with a maximum dose of  in a 24 hour period.  Pain/Fever: Ibuprofen  -  every 4-6 hours as needed (MAX  in a 24 hour period) Pain/Fever: Tylenol  -  every 6-8 hours as needed (MAX  in a 24 hour period)  Cough: Dextromethorphan (Delsym)- follow directions on packing with a maximum dose of  in a 24 hour period.  Nasal Stuffiness: Saline nasal spray and/or Nettie Pot with sterile saline solution  Runny Nose: Fluticasone nasal spray (Flonase) OR Mometasone nasal spray (Nasonex) OR Triamcinolone Acetonide nasal spray (Nasacort)- follow directions on the packaging  Pain/Pressure: Warm washcloth to the face  Sore Throat: Warm salt water gargles  If you have allergies, you may also consider taking an oral antihistamine (like Zyrtec or Claritin) as these may also help with your symptoms.  **Many  medications will have more than one ingredient, be sure you are reading the packaging carefully and not taking more than one dose of the same kind of medication at the same time or too close together. It is OK to use formulas that have all of the ingredients you want, but do not take them in a combined medication and as separate dose too close together. If you have any questions, the pharmacist will be happy to help you decide what is safe.

## 2022-06-29 ENCOUNTER — Ambulatory Visit (INDEPENDENT_AMBULATORY_CARE_PROVIDER_SITE_OTHER): Payer: No Typology Code available for payment source | Admitting: Mental Health

## 2022-06-29 DIAGNOSIS — F4310 Post-traumatic stress disorder, unspecified: Secondary | ICD-10-CM | POA: Diagnosis not present

## 2022-06-29 DIAGNOSIS — F319 Bipolar disorder, unspecified: Secondary | ICD-10-CM

## 2022-06-29 DIAGNOSIS — F900 Attention-deficit hyperactivity disorder, predominantly inattentive type: Secondary | ICD-10-CM | POA: Diagnosis not present

## 2022-06-29 NOTE — Progress Notes (Signed)
THERAPIST PROGRESS NOTE Virtual Visit via Video Note  I connected with Kathryn [name] on 06/29/22 at  8:00 AM EDT by a video enabled telemedicine application and verified that I am speaking with the correct person using two identifiers.  Location: Patient: home address on file Provider: home office    I discussed the limitations of evaluation and management by telemedicine and the availability of in person appointments. The patient expressed understanding and agreed to proceed.  I discussed the assessment and treatment plan with the patient. The patient was provided an opportunity to ask questions and all were answered. The patient agreed with the plan and demonstrated an understanding of the instructions.   The patient was advised to call back or seek an in-person evaluation if the symptoms worsen or if the condition fails to improve as anticipated.  I provided 53 minutes of non-face-to-face time during this encounter.   Dorris Singh, [facility]   Session Time: 8:06 am ( 53 minutes)  Participation Level: Active  Behavioral Response: CasualAlertWNL  Type of Therapy: Individual Therapy  Treatment Goals addressed:  STG: Dai will increase management of moods AEB development of effective coping skills with ability to explore and engage in enjoyable activities and self-care weekly withn the next 6 months    STG: [place] will process past traumatic events in healthy manner AEB ability to identify and reframe distorted thinking patterns daily as needed within the next 6 months    ProgressTowards Goals: Progressing  Interventions: CBT and Supportive  Summary: [name] is a 27 y.o. female who presents with dx of bipolar d/o, PTSD, GAD and ADHD. Kathryn [name] presents alert and oriented; mood and affect adequate. Speech clear and coherent at normal rate and tone. Engaged and receptive to interventions. Shares to be doing well and describes moods as "not bad." Shares working on making  behavioral changes and being consistent. Notes to have reduced drinking behaviors from daily/every other day to x 2 weekly drinking of x 1 glass of wine, 2 shots or vodka or 2 to 3 beers. Shares has changed behavior of getting off work and purchasing a soda vs. Purchasing alcohol. Shares can drink after work when stressful and identifies other relaxation coping mechanisms. Shares use of coping with taking long shower, cudding with dog (Taz), Lego and writing in journal. Explored sxs of trauma and reports for sxs to have largely resolved. Notes " I still have parts of her with me." And shares relationships with deceased ex-girlfriends family and daughter. Notes to have good memories. Shares hx of relationship with father and explores avoidance vs. Boundary setting. Denies safety concerns. Progress with goals; use of coping skills; decrease in trauma sxs and working to process past trauma in healthy manner. Sxs stable at this time.   Suicidal/Homicidal: Nowithout intent/plan  Therapist Response: Therapist engaged Kathryn [name] in [employer]. Confirmed current location and ability to hold confidential session. Therapist engaged Kathryn [name] in check in and assessed for current level of functioning sxs management. Provided safe space for [name] to share thoughts and feelings. Provided supportive feedback. Assessed for drinking behaviors and ability to engage in replacement behaviors reviewed intrinsic motivation to reduce/cease drinking behaviors. Explored factors that have lead to drinking behaviors and hx of substance use. Engaged in processing avoidance behaviors vs. Healthy and appropriate boundary setting. Supported in processing grieving of individuals that are still physically with Kathryn [name]. Assessed for moods and trauma sxs. Processed feelings of grief and managing feelings and feeling feelings. Reviewed session, provided follow up.  Assessed for safety concerns.   Plan: Return again in  x 4 weeks.  Diagnosis:  Bipolar 1 disorder, depressed (HCC)  Post-traumatic stress  Attention deficit hyperactivity disorder (ADHD), predominantly inattentive type  Collaboration of Care: Other None  Patient/Guardian was advised Release of Information must be obtained prior to any record release in order to collaborate their care with an outside provider. Patient/Guardian was advised if they have not already done so to contact the registration department to sign all necessary forms in order for Kathryn [name] to release information regarding their care.   Consent: Patient/Guardian gives verbal consent for treatment and assignment of benefits for services provided during this visit. Patient/Guardian expressed understanding and agreed to proceed.   Stephan Minister [place], [facility] 06/29/2022

## 2022-06-30 ENCOUNTER — Other Ambulatory Visit: Payer: Self-pay

## 2022-06-30 DIAGNOSIS — N946 Dysmenorrhea, unspecified: Secondary | ICD-10-CM

## 2022-06-30 MED ORDER — LEVONORGEST-ETH ESTRAD 91-DAY 0.15-0.03 &0.01 MG PO TABS: 1.0000 | ORAL_TABLET | Freq: Every day | ORAL | 4 refills | Status: AC

## 2022-07-06 ENCOUNTER — Telehealth (HOSPITAL_COMMUNITY): Payer: Self-pay | Admitting: *Deleted

## 2022-07-06 DIAGNOSIS — F900 Attention-deficit hyperactivity disorder, predominantly inattentive type: Secondary | ICD-10-CM

## 2022-07-06 NOTE — Telephone Encounter (Signed)
Pt LVM requesting a refill of the Adderall 12.5 mg tabs 2 tabs taken BID. Last e-scribed to [facility] [address] on 05/25/22. Pt has a f/u scheduled for 07/29/22.

## 2022-07-07 MED ORDER — AMPHETAMINE-DEXTROAMPHETAMINE 12.5 MG PO TABS: 25.0000 mg | ORAL_TABLET | Freq: Two times a day (BID) | ORAL | 0 refills | Status: DC

## 2022-07-07 NOTE — Telephone Encounter (Signed)
Received message patient needed refill of her Adderall.  Per PDMP last filled 4/18.  Refill was sent.    Sent: -Adderall 25 mg BID.  120 (12.5 mg) tablets with 0 refills.    Arna Snipe MD Resident

## 2022-07-29 ENCOUNTER — Ambulatory Visit: Payer: No Typology Code available for payment source | Admitting: Nurse Practitioner

## 2022-07-29 ENCOUNTER — Other Ambulatory Visit (HOSPITAL_COMMUNITY): Payer: Self-pay | Admitting: Student in an Organized Health Care Education/Training Program

## 2022-07-29 ENCOUNTER — Telehealth (HOSPITAL_COMMUNITY)
Payer: No Typology Code available for payment source | Admitting: Student in an Organized Health Care Education/Training Program

## 2022-07-29 ENCOUNTER — Telehealth (HOSPITAL_COMMUNITY): Payer: Self-pay | Admitting: Student in an Organized Health Care Education/Training Program

## 2022-07-29 ENCOUNTER — Encounter: Payer: Self-pay | Admitting: Nurse Practitioner

## 2022-07-29 VITALS — BP 130/89 | HR 76 | Temp 98.4°F | Resp 16 | Ht 68.5 in | Wt 201.1 lb

## 2022-07-29 DIAGNOSIS — F411 Generalized anxiety disorder: Secondary | ICD-10-CM

## 2022-07-29 DIAGNOSIS — R6889 Other general symptoms and signs: Secondary | ICD-10-CM | POA: Diagnosis not present

## 2022-07-29 DIAGNOSIS — F319 Bipolar disorder, unspecified: Secondary | ICD-10-CM

## 2022-07-29 DIAGNOSIS — F900 Attention-deficit hyperactivity disorder, predominantly inattentive type: Secondary | ICD-10-CM

## 2022-07-29 LAB — POC COVID19 BINAXNOW: SARS Coronavirus 2 Ag: NEGATIVE

## 2022-07-29 MED ORDER — AMPHETAMINE-DEXTROAMPHETAMINE 12.5 MG PO TABS: 25.0000 mg | ORAL_TABLET | Freq: Two times a day (BID) | ORAL | 0 refills | Status: DC

## 2022-07-29 MED ORDER — RISPERIDONE 1 MG PO TABS
1.0000 mg | ORAL_TABLET | Freq: Every day | ORAL | 1 refills | Status: DC
Start: 2022-07-29 — End: 2022-08-28

## 2022-07-29 MED ORDER — LAMOTRIGINE 200 MG PO TABS
200.0000 mg | ORAL_TABLET | Freq: Every day | ORAL | 1 refills | Status: DC
Start: 2022-07-29 — End: 2022-08-28

## 2022-07-29 MED ORDER — VENLAFAXINE HCL ER 75 MG PO CP24: ORAL_CAPSULE | ORAL | 1 refills | Status: DC

## 2022-07-29 NOTE — Progress Notes (Signed)
Refilled patient's medications, including Aderrall 25mg  BID. [name]'s provider is out of office currently.   PGY-3  Eliseo Gum, MD

## 2022-07-29 NOTE — Patient Instructions (Signed)
URI Instructions: Flonase and Afrin use: apply 1spray of afrin in each nare, wait , then apply 2sprays of flonase in each nare. Use both nasal spray consecutively x 3days, then flonase only for at least 14days.  Encourage adequate oral hydration. Use over-the-counter  cold" medicine  such as Dayquil/nyquil for cough and congestion.  Use mucinex DM or Robitussin  or delsym for cough without congestion  You can use plain "Tylenol" or "Advil" for fever, chills and achyness. Use cool mist humidifier at bedtime to help with nasal congestion and cough.  Cold/cough medications may have tylenol or ibuprofen or guaifenesin or dextromethophan in them, so be careful not to take beyond the recommended dose for each of these medications.   "Common cold" symptoms are usually triggered by a virus.  The antibiotics are usually not necessary. On average, a" viral cold" illness may take 7-10 days to resolve. Please, make an appointment if you are not better or if you're worse.

## 2022-07-29 NOTE — Progress Notes (Signed)
Established Patient Visit  Patient: Kathryn [name]   DOB: Feb 17, 1995   27 y.o. Female  MRN: [id number] Visit Date: 07/29/2022  Subjective:    Chief Complaint  Patient presents with   URI    Headache, ears hurt/ pressure and stuffy nose.  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  Employer sent her to rule out illness    URI  This is a new problem. The current episode started yesterday. The problem has been unchanged. There has been no fever. Associated symptoms include congestion, ear pain, headaches and sinus pain. Pertinent negatives include no abdominal pain, chest pain, coughing, diarrhea, dysuria, joint pain, joint swelling, nausea, neck pain, plugged ear sensation, rash, rhinorrhea, sneezing, sore throat, swollen glands, vomiting or wheezing. She has tried nothing for the symptoms.  Daily tobacco use Varicella vaccine completed in High School per patient. No rash present today  Reviewed medical, surgical, and social history today  Medications: Outpatient Medications Prior to Visit  Medication Sig   ferrous sulfate 325 (65 FE) MG tablet Take 325 mg by mouth daily with breakfast.   Levonorgestrel-Ethinyl Estradiol (AMETHIA) 0.15-0.03 &0.01 MG tablet Take 1 tablet by mouth daily.   omeprazole (PRILOSEC) 40 MG capsule Take 1 capsule (40 mg total) by mouth 2 (two) times daily.   valACYclovir (VALTREX) 1000 MG tablet Take 1 tablet (1,000 mg total) by mouth daily.   vitamin B-12 (CYANOCOBALAMIN) 500 MCG tablet Take 500 mcg by mouth daily.   [DISCONTINUED] amoxicillin-clavulanate (AUGMENTIN) 875-125 MG tablet Take 1 tablet by mouth 2 (two) times daily.   [DISCONTINUED] amphetamine-dextroamphetamine (ADDERALL) 12.5 MG tablet Take 2 tablets by mouth 2 (two) times daily.   [DISCONTINUED] lamoTRIgine (LAMICTAL) 200 MG tablet Take 1 tablet (200 mg total) by mouth at bedtime. Take one a day   [DISCONTINUED] risperiDONE (RISPERDAL) 1 MG  tablet Take 1 tablet (1 mg total) by mouth at bedtime.   [DISCONTINUED] venlafaxine XR (EFFEXOR-XR) 75 MG 24 hr capsule TAKE ONE CAPSULE BY MOUTH DAILY  WITH BREAKFAST   No facility-administered medications prior to visit.   Reviewed past medical and social history.   ROS per HPI above      Objective:  BP 130/89 (BP Location: Left Arm, Patient Position: Sitting, Cuff Size: Large)   Pulse 76   Temp 98.4 F (36.9 C) (Oral)   Resp 16   Ht 5' 8.5" (1.74 m)   Wt 201 lb 1.6 oz (91.2 kg)   SpO2 98%   BMI 30.13 kg/m      Physical Exam Vitals and nursing note reviewed.  Constitutional:      General: She is not in acute distress. HENT:     Right Ear: Tympanic membrane, ear canal and external ear normal.     Left Ear: Tympanic membrane, ear canal and external ear normal.     Nose: No nasal tenderness, mucosal edema, congestion or rhinorrhea.     Right Nostril: No occlusion.     Left Nostril: No occlusion.     Right Turbinates: Swollen. Not enlarged or pale.     Left Turbinates: Swollen. Not enlarged or pale.     Right Sinus: No maxillary sinus tenderness or frontal sinus tenderness.     Left Sinus: No maxillary sinus tenderness or frontal sinus tenderness.     Mouth/Throat:     Pharynx: Oropharynx is clear. Uvula midline. Posterior  oropharyngeal erythema present. No pharyngeal swelling, oropharyngeal exudate or uvula swelling.     Tonsils: No tonsillar exudate or tonsillar abscesses.  Eyes:     Extraocular Movements: Extraocular movements intact.     Conjunctiva/sclera: Conjunctivae normal.  Cardiovascular:     Rate and Rhythm: Normal rate and regular rhythm.     Pulses: Normal pulses.     Heart sounds: Normal heart sounds.  Pulmonary:     Effort: Pulmonary effort is normal.     Breath sounds: Normal breath sounds.  Musculoskeletal:     Cervical back: Normal range of motion and neck supple.  Lymphadenopathy:     Cervical: No cervical adenopathy.  Skin:    General: Skin  is warm and dry.     Findings: No rash.  Neurological:     Mental Status: She is alert and oriented to person, place, and time.     Results for orders placed or performed in visit on 07/29/22  POC COVID-19 BinaxNow  Result Value Ref Range   SARS Coronavirus 2 Ag Negative Negative      Assessment & Plan:    Problem List Items Addressed This Visit   None Visit Diagnoses     Flu-like symptoms    -  Primary   Relevant Orders   POC COVID-19 BinaxNow (Completed)     Provided work note Advised to use OVER THE COUNTER meds for symptom management and maintain adequate oral hydration.  Return if symptoms worsen or fail to improve.     Alysia Penna, NP

## 2022-07-30 ENCOUNTER — Telehealth (HOSPITAL_COMMUNITY)
Payer: No Typology Code available for payment source | Admitting: Student in an Organized Health Care Education/Training Program

## 2022-08-04 ENCOUNTER — Ambulatory Visit (HOSPITAL_COMMUNITY): Payer: No Typology Code available for payment source | Admitting: Mental Health

## 2022-08-04 DIAGNOSIS — F319 Bipolar disorder, unspecified: Secondary | ICD-10-CM

## 2022-08-04 DIAGNOSIS — F411 Generalized anxiety disorder: Secondary | ICD-10-CM

## 2022-08-04 NOTE — Progress Notes (Signed)
THERAPIST PROGRESS NOTE Virtual Visit via Video Note  I connected with Kathryn [name] on 08/04/22 at  8:00 AM EDT by a video enabled telemedicine application and verified that I am speaking with the correct person using two identifiers.  Location: Patient: home address on file Provider: office   I discussed the limitations of evaluation and management by telemedicine and the availability of in person appointments. The patient expressed understanding and agreed to proceed.  I discussed the assessment and treatment plan with the patient. The patient was provided an opportunity to ask questions and all were answered. The patient agreed with the plan and demonstrated an understanding of the instructions.   The patient was advised to call back or seek an in-person evaluation if the symptoms worsen or if the condition fails to improve as anticipated.  I provided 53 minutes of non-face-to-face time during this encounter.   Dorris Singh, [facility]   Session Time: 8:05am   Participation Level: Active  Behavioral Response: CasualAlertDysphoric  Type of Therapy: Individual Therapy  Treatment Goals addressed: STG: Kathryn [name] will increase management of moods AEB development of effective coping skills with ability to explore and engage in enjoyable activities and self-care weekly withn the next 6 months    STG: Kathryn [name] will process past traumatic events in healthy manner AEB ability to identify and reframe distorted thinking patterns daily as needed within the next 6 months    ProgressTowards Goals: Progressing  Interventions: CBT and Supportive  Summary: Kathryn [name] is a 27 y.o. female who presents with dx of bipolar d/o, PTSD, GAD and ADHD. Kathryn [name] presents alert and oriented; mood and affect adequate. Speech clear and coherent at normal rate and tone. Engaged and receptive to interventions. Shares to be doing "ok." Reports chief complaint of increase in stressors related to work, upcoming  anniversary of previous partner's birthday and passing away, grand-mothers passing anniversary. Shares concerned about ability to do well in school now that she has applied. Shares feelings of loneliness, " I don't have that person." Shares maldaptive thinking related engagement with female friend potential partner in which she assumed she would be jugemental against her. Notes to feel as if she is not where she wants to be in life. Notes excessive drinking last night in which she was feeling bad about herself " I handled it the wrong way. It got to me." Notes has not see family and feels as if she needs a break.  Notes concern for school with ADHD dx and notes Auditory processing disorder with hx of special education courses in school. Engaged with therapist and exploration of working to manage moods and setting weekly/daily routine for self to increase attendance to healthy habits, eating well, gym attendance etc. Progress with goals, sxs stable at this time. Will increase frequency of sessions to support during anniversary of previous spouse passing and entrance to school. Denies SI/HI  Suicidal/Homicidal: Nowithout intent/plan  Therapist Response: Therapist engaged Kathryn [name] in [employer]. Confirmed current location and ability to hold confidential session. Therapist engaged Kathryn [name] in check in and assessed for current level of functioning sxs management. Provided safe place to share thoughts and feelings regarding current stressors. Provided support and encouragement; validated feelings .Explored factors contributing to excess drinking behaviors. Engaged in working through feelings of self-judgement and self-depreciating thoughts. Explored ways in which she has projected this judgement onto others. Identified distorted thoughts and supported in reframing. Explored working to increase awareness of internal dialogue and monitor. Encouraged working to identify strengths and  not comparing self to others.  Explored areas in which can support in increase level of functioning with routine and attendance to healthier eating habits. Reviewed session and provided follow up appointment.   Plan: Return again in  x 4 weeks.  Diagnosis: Bipolar 1 disorder, depressed (HCC)  GAD (generalized anxiety disorder)  Collaboration of Care: Other None  Patient/Guardian was advised Release of Information must be obtained prior to any record release in order to collaborate their care with an outside provider. Patient/Guardian was advised if they have not already done so to contact the registration department to sign all necessary forms in order for [name] to release information regarding their care.   Consent: Patient/Guardian gives verbal consent for treatment and assignment of benefits for services provided during this visit. Patient/Guardian expressed understanding and agreed to proceed.   Stephan Minister [place], [facility] 08/04/2022

## 2022-08-28 ENCOUNTER — Encounter (HOSPITAL_COMMUNITY): Payer: Self-pay | Admitting: Student in an Organized Health Care Education/Training Program

## 2022-08-28 ENCOUNTER — Ambulatory Visit (INDEPENDENT_AMBULATORY_CARE_PROVIDER_SITE_OTHER)
Payer: No Typology Code available for payment source | Admitting: Student in an Organized Health Care Education/Training Program

## 2022-08-28 DIAGNOSIS — F411 Generalized anxiety disorder: Secondary | ICD-10-CM

## 2022-08-28 DIAGNOSIS — F900 Attention-deficit hyperactivity disorder, predominantly inattentive type: Secondary | ICD-10-CM | POA: Diagnosis not present

## 2022-08-28 DIAGNOSIS — F319 Bipolar disorder, unspecified: Secondary | ICD-10-CM

## 2022-08-28 MED ORDER — AMPHETAMINE-DEXTROAMPHETAMINE 12.5 MG PO TABS
25.0000 mg | ORAL_TABLET | Freq: Two times a day (BID) | ORAL | 0 refills | Status: DC
Start: 2022-09-07 — End: 2022-11-27

## 2022-08-28 MED ORDER — AMPHETAMINE-DEXTROAMPHETAMINE 12.5 MG PO TABS
25.0000 mg | ORAL_TABLET | Freq: Two times a day (BID) | ORAL | 0 refills | Status: DC
Start: 2022-10-08 — End: 2022-11-27

## 2022-08-28 MED ORDER — LAMOTRIGINE 200 MG PO TABS
200.0000 mg | ORAL_TABLET | Freq: Every day | ORAL | 2 refills | Status: DC
Start: 2022-08-28 — End: 2022-11-27

## 2022-08-28 MED ORDER — RISPERIDONE 1 MG PO TABS: 1.0000 mg | ORAL_TABLET | Freq: Every day | ORAL | 2 refills | Status: DC

## 2022-08-28 MED ORDER — AMPHETAMINE-DEXTROAMPHETAMINE 12.5 MG PO TABS: 25.0000 mg | ORAL_TABLET | Freq: Two times a day (BID) | ORAL | 0 refills | Status: DC

## 2022-08-28 MED ORDER — VENLAFAXINE HCL ER 75 MG PO CP24: ORAL_CAPSULE | ORAL | 2 refills | Status: DC

## 2022-08-28 NOTE — Progress Notes (Addendum)
BH MD/PA/NP OP Progress Note  Virtual Visit via Video Note  I connected with Kathryn [name] on 08/28/22 at  8:00 AM EDT by a video enabled telemedicine application and verified that I am speaking with the correct person using two identifiers.  Location: Patient: Work Provider: [facility]   I discussed the limitations of evaluation and management by telemedicine and the availability of in person appointments. The patient expressed understanding and agreed to proceed.    08/28/2022 8:32 AM Kathryn [name]  MRN:  [id number]  Chief Complaint:  Chief Complaint  Patient presents with   Follow-up   Bipolar 1 Disorder   ADHD   Medication Refill   HPI:  Kathryn [name] is a 27 yr old female who presents for Follow Up and Medication Management. PPHx is significant for Bipolar Disorder, GAD, PTSD, and ADHD.  She reports that she has been doing okay.  She reports her sleep has been a little less good this month.  She reports that this month is hard for her because this was her girlfriend's both birthday and anniversary of her death.  She reports that while this is tough for her she is been managing appropriately and continuing to work on this in therapy.  She reports her mood has remained stable.  She reports no side effects to her medications.  Discussed with her that we would not make any changes to her medications at this time and she was agreeable with this.  She reports no SI, HI, or AVH.  She reports sleep is fair.  She reports her appetite is doing good.  She reports no other concerns at present.  She will return for follow-up in approximately 3 months.    Visit Diagnosis:    ICD-10-CM   1. Bipolar 1 disorder, depressed (HCC)  F31.9 lamoTRIgine (LAMICTAL) 200 MG tablet    risperiDONE (RISPERDAL) 1 MG tablet    venlafaxine XR (EFFEXOR-XR) 75 MG 24 hr capsule    2. Attention deficit hyperactivity disorder (ADHD), predominantly inattentive type  F90.0 amphetamine-dextroamphetamine (ADDERALL) 12.5 MG  tablet    amphetamine-dextroamphetamine (ADDERALL) 12.5 MG tablet    amphetamine-dextroamphetamine (ADDERALL) 12.5 MG tablet    3. GAD (generalized anxiety disorder)  F41.1 venlafaxine XR (EFFEXOR-XR) 75 MG 24 hr capsule       Past Psychiatric History: Bipolar Disorder, GAD, PTSD, and ADHD.   Past Medical History:  Past Medical History:  Diagnosis Date   Abnormal liver function tests 02/02/2016   ADHD 06/10/2016   Alcohol use 02/23/2016   Allergy    Anxiety    Arm fracture, right    as child, fx in three places, no surgery   Depression    Depression with anxiety 18-Apr-2014   PTSD s/p death of best friend    Essential hypertension 02/02/2016   GERD (gastroesophageal reflux disease)    H/O cold sores 04/15/2015   Headache 04/09/2016   Hyperlipidemia, mixed 02/02/2016   Migraine    Ovarian cyst    PCOS (polycystic ovarian syndrome)    Preventative health care 10/27/2015   Thrombocytosis 04/09/2016   Vaginal Pap smear, abnormal    HGSIL    Past Surgical History:  Procedure Laterality Date   ADENOIDECTOMY  2002   TYMPANOSTOMY TUBE PLACEMENT Bilateral 1999    Family Psychiatric History: Maternal Aunt- Anxiety Maternal Grandfather- Anxiety Mother- Anxiety and OCD  Family History:  Family History  Problem Relation Age of Onset   Hypertension Mother    Heart disease Mother  CAD, stent at 10, s/p cardiac arrest with Defib   Hyperlipidemia Mother    Diabetes Mother        s/p gastric bypass   Obesity Mother        s/p gastric bypass   OCD Mother    Hyperlipidemia Father    Hypertension Father    Anxiety disorder Father    Asthma Sister    Hyperlipidemia Brother    Anxiety disorder Brother    Stroke Maternal Grandmother    Thyroid disease Maternal Grandmother    Anxiety disorder Maternal Grandmother    Heart disease Maternal Grandfather        MI first at 58, s/p stents and CABG   Hyperlipidemia Maternal Grandfather    Hypertension Maternal Grandfather     Stroke Maternal Grandfather    Cancer Maternal Grandfather        melanoma   Aneurysm Maternal Grandfather        brain   Kidney disease Maternal Grandfather        tumor   Heart disease Paternal Grandmother    Cancer Paternal Grandfather        ung and brain   Heart disease Maternal Uncle    Vision loss Maternal Uncle    Anxiety disorder Maternal Uncle    Anxiety disorder Maternal Aunt    Depression Maternal Aunt     Social History:  Social History   Socioeconomic History   Marital status: Single    Spouse name: Not on file   Number of children: 0   Years of education: 12   Highest education level: Not on file  Occupational History    Comment: [employer]  Tobacco Use   Smoking status: Heavy Smoker    Current packs/day: 1.50    Types: Cigarettes   Smokeless tobacco: Former    Types: Snuff  Vaping Use   Vaping status: Never Used  Substance and Sexual Activity   Alcohol use: Yes    Comment: maybe every 6 months   Drug use: Yes    Types: Marijuana    Comment: daily   Sexual activity: Yes    Partners: Male    Birth control/protection: Pill  Other Topics Concern   Not on file  Social History Narrative   Not on file   Social Determinants of Health   Financial Resource Strain: Medium Risk (01/16/2022)   Overall Financial Resource Strain (CARDIA)    Difficulty of Paying Living Expenses: Somewhat hard  Food Insecurity: No Food Insecurity (01/16/2022)   Hunger Vital Sign    Worried About Running Out of Food in the Last Year: Never true    Ran Out of Food in the Last Year: Never true  Transportation Needs: No Transportation Needs (01/16/2022)   PRAPARE - Administrator, Civil Service (Medical): No    Lack of Transportation (Non-Medical): No  Physical Activity: Inactive (01/16/2022)   Exercise Vital Sign    Days of Exercise per Week: 0 days    Minutes of Exercise per Session: 0 min  Stress: No Stress Concern Present (01/16/2022)   [employer] of  Occupational Health - Occupational Stress Questionnaire    Feeling of Stress : Only a little  Social Connections: Socially Isolated (01/16/2022)   Social Connection and Isolation Panel [NHANES]    Frequency of Communication with Friends and Family: More than three times a week    Frequency of Social Gatherings with Friends and Family: Never    Attends Religious  Services: Never    Active Member of Clubs or Organizations: No    Attends Banker Meetings: Never    Marital Status: Never married    Allergies:  Allergies  Allergen Reactions   Oxycodone Nausea Only, Nausea And Vomiting and Other (See Comments)    Oxycontin also Oxycontin also Oxycontin also Oxycontin also    Quetiapine Fumarate Other (See Comments)    Mental status changes Mental status changes Mental status changes Mental status changes     Metabolic Disorder Labs: Lab Results  Component Value Date   HGBA1C 5.2 11/17/2018   No results found for: "PROLACTIN" Lab Results  Component Value Date   CHOL 222 (H) 06/18/2021   TRIG 72.0 06/18/2021   HDL 56.90 06/18/2021   CHOLHDL 4 06/18/2021   VLDL 14.4 06/18/2021   LDLCALC 150 (H) 06/18/2021   LDLCALC 166 (H) 11/17/2018   Lab Results  Component Value Date   TSH 0.735 11/17/2018   TSH 2.49 05/05/2017    Therapeutic Level Labs: No results found for: "LITHIUM" No results found for: "VALPROATE" No results found for: "CBMZ"  Current Medications: Current Outpatient Medications  Medication Sig Dispense Refill   [START ON 10/08/2022] amphetamine-dextroamphetamine (ADDERALL) 12.5 MG tablet Take 2 tablets by mouth 2 (two) times daily. 120 tablet 0   [START ON 09/07/2022] amphetamine-dextroamphetamine (ADDERALL) 12.5 MG tablet Take 2 tablets by mouth 2 (two) times daily. 120 tablet 0   [START ON 11/08/2022] amphetamine-dextroamphetamine (ADDERALL) 12.5 MG tablet Take 2 tablets by mouth 2 (two) times daily. 120 tablet 0   ferrous sulfate 325 (65 FE) MG  tablet Take 325 mg by mouth daily with breakfast.     lamoTRIgine (LAMICTAL) 200 MG tablet Take 1 tablet (200 mg total) by mouth at bedtime. Take one a day 30 tablet 2   Levonorgestrel-Ethinyl Estradiol (AMETHIA) 0.15-0.03 &0.01 MG tablet Take 1 tablet by mouth daily. 91 tablet 4   omeprazole (PRILOSEC) 40 MG capsule Take 1 capsule (40 mg total) by mouth 2 (two) times daily. 180 capsule 3   risperiDONE (RISPERDAL) 1 MG tablet Take 1 tablet (1 mg total) by mouth at bedtime. 30 tablet 2   valACYclovir (VALTREX) 1000 MG tablet Take 1 tablet (1,000 mg total) by mouth daily. 90 tablet 4   venlafaxine XR (EFFEXOR-XR) 75 MG 24 hr capsule TAKE ONE CAPSULE BY MOUTH DAILY  WITH BREAKFAST 30 capsule 2   vitamin B-12 (CYANOCOBALAMIN) 500 MCG tablet Take 500 mcg by mouth daily.     No current facility-administered medications for this visit.     Musculoskeletal: Strength & Muscle Tone: within normal limits Gait & Station:  Sitting During Interview Patient leans: N/A  Psychiatric Specialty Exam: Review of Systems  Respiratory:  Negative for shortness of breath.   Cardiovascular:  Negative for chest pain.  Gastrointestinal:  Negative for abdominal pain, constipation, diarrhea, nausea and vomiting.  Neurological:  Negative for dizziness, weakness and headaches.  Psychiatric/Behavioral:  Positive for dysphoric mood (mild) and sleep disturbance (mild). Negative for hallucinations and suicidal ideas. The patient is not nervous/anxious.     There were no vitals taken for this visit.There is no height or weight on file to calculate BMI.  General Appearance: Casual and Fairly Groomed  Eye Contact:  Good  Speech:  Clear and Coherent and Normal Rate  Volume:  Normal  Mood:   "ok"  Affect:  Appropriate and Congruent  Thought Process:  Coherent and Goal Directed  Orientation:  Full (Time, Place, and  Person)  Thought Content: WDL and Logical   Suicidal Thoughts:  No  Homicidal Thoughts:  No  Memory:   Immediate;   Good Recent;   Good  Judgement:  Good  Insight:  Good  Psychomotor Activity:  Normal  Concentration:  Concentration: Good and Attention Span: Good  Recall:  Good  Fund of Knowledge: Good  Language: Good  Akathisia:  Negative  Handed:  Right  AIMS (if indicated): not done  Assets:  Communication Skills Desire for Improvement Housing Physical Health Resilience  ADL's:  Intact  Cognition: WNL  Sleep:  Fair   Screenings: AUDIT    Advertising copywriter from 01/16/2022 in [facility]  Alcohol Use Disorder Identification Test Final Score (AUDIT) 8      GAD-7    Flowsheet Row Office Visit from 04/24/2022 in [facility] from 01/16/2022 in [facility] Video Visit from 05/27/2021 in [facility] Video Visit from 03/13/2021 in [facility] Video Visit from 12/19/2020 in [facility]  Total GAD-7 Score 0 7 3 1 2       PHQ2-9    Flowsheet Row Office Visit from 04/24/2022 in [facility] [place] Counselor from 01/16/2022 in [facility] Video Visit from 05/27/2021 in [facility] Video Visit from 03/13/2021 in [facility] Video Visit from 12/19/2020 in [facility]  PHQ-2 Total Score 0 5 3 2 1   PHQ-9 Total Score 2 14 6 7 2       Flowsheet Row Counselor from 01/16/2022 in [facility] Video Visit from 05/27/2021 in [facility]  C-SSRS RISK CATEGORY Low Risk No Risk        Assessment and Plan:  Kathryn [name] is a 27 yr old female who presents for Follow Up and Medication Management. PPHx is significant for Bipolar Disorder, GAD, PTSD, and ADHD.   Kathryn [name] has continued to do well with her current  medication regimen.  This time of year is more difficult for her but she is managing appropriately.  We will not make any changes to her medications at this time.  Refills were sent in.  She will return for follow-up in approximately 3 months.   Bipolar 1 disorder, depressed  GAD -Continue Effexor XR 75 mg daily for depression and anxiety.  30 tablets with 2 refills. -Continue Risperdal 1 mg QHS for mood stability.  30 tablets with 2 refills. -Continue Lamictal 200 mg QHS for mood stability.  30 tablets with 2 refills.     ADHD -Continue Adderall 20 mg BID.  120 (12.5 mg) tablets with 2 refills.  Last fill 6/25 so First fill 7/22    Collaboration of Care: Collaboration of Care: Other provider involved in patient's care AEB [facility] Therapist  Patient/Guardian was advised Release of Information must be obtained prior to any record release in order to collaborate their care with an outside provider. Patient/Guardian was advised if they have not already done so to contact the registration department to sign all necessary forms in order for [name] to release information regarding their care.   Consent: Patient/Guardian gives verbal consent for treatment and assignment of benefits for services provided during this visit. Patient/Guardian expressed understanding and agreed to proceed.    Lauro Franklin, MD 08/28/2022, 8:32 AM   Follow Up Instructions:  I discussed the assessment and treatment plan with the patient. The patient was provided an opportunity to ask questions and all were answered. The patient agreed with the plan and demonstrated an understanding of the instructions.   The patient was advised to call back or seek an in-person evaluation if the symptoms worsen or if the condition fails to improve as anticipated.  I provided 10 minutes of non-face-to-face time during this encounter.   Lauro Franklin, MD

## 2022-09-02 ENCOUNTER — Ambulatory Visit (HOSPITAL_COMMUNITY): Payer: No Typology Code available for payment source | Admitting: Mental Health

## 2022-09-02 DIAGNOSIS — F319 Bipolar disorder, unspecified: Secondary | ICD-10-CM | POA: Diagnosis not present

## 2022-09-02 DIAGNOSIS — F900 Attention-deficit hyperactivity disorder, predominantly inattentive type: Secondary | ICD-10-CM

## 2022-09-02 DIAGNOSIS — F4310 Post-traumatic stress disorder, unspecified: Secondary | ICD-10-CM

## 2022-09-02 NOTE — Progress Notes (Signed)
THERAPIST PROGRESS NOTE Virtual Visit via Video Note  I connected with Kathryn [name] on 09/02/22 at  2:00 PM EDT by a video enabled telemedicine application and verified that I am speaking with the correct person using two identifiers.  Location: Patient: home Provider: office   I discussed the limitations of evaluation and management by telemedicine and the availability of in person appointments. The patient expressed understanding and agreed to proceed.  I discussed the assessment and treatment plan with the patient. The patient was provided an opportunity to ask questions and all were answered. The patient agreed with the plan and demonstrated an understanding of the instructions.   The patient was advised to call back or seek an in-person evaluation if the symptoms worsen or if the condition fails to improve as anticipated.  I provided 40 minutes of non-face-to-face time during this encounter.   Dorris Singh, [facility]   Session Time: 2:04 pm ( 44 minutes)  Participation Level: Active  Behavioral Response: CasualAlertAnxious  Type of Therapy: Individual Therapy  Treatment Goals addressed: STG: Kathryn [name] will increase management of moods AEB development of effective coping skills with ability to explore and engage in enjoyable activities and self-care weekly withn the next 6 months    STG: Kathryn [name] will process past traumatic events in healthy manner AEB ability to identify and reframe distorted thinking patterns daily as needed within the next 6 months    ProgressTowards Goals: Progressing  Interventions: CBT and Supportive  Summary: Kathryn [name] is a 27 y.o. female who presents with dx of bipolar d/o, PTSD, GAD and ADHD. Kathryn [name] presents alert and oriented; mood and affect adequate. Speech clear and coherent at normal rate and tone. Engaged and receptive to interventions. Denies excessive elevated moods. Notes sxs of low mood and increased anxiety. Shares chief complaint of  anxiety related to deceased ex-girlfriend's [place]) upcoming birthday. Shares " I was freaking out." Notes increase in drinking behaviors and increased weight gain. Notes to be drinking vodka daily and believes feelings of loneliness to be a factor. Reports has been reading this book "Unfuck Yourself" and shares reading same concepts discussed in therapy services. Shares awareness of needing to restructure her thinking and work on making changes but unclear of where to start. Notes would like to reduce drinking behavior and hesitant on desire to completely cease. Engaged with therapist and agrees to work on identifying daily routine and identifying behavioral changes she would like to engage in. Shares plans to present home, spend time with family and go fishing with a friend. Notes likelihood of having 2 to 3 beers while fishing. Agrees to goals. Denies safety concerns. Regression with goals.   Suicidal/Homicidal: Nowithout intent/plan  Therapist Response: Therapist engaged Gustavo in [employer]. Confirmed current location and ability to hold confidential session. Therapist engaged Keslie in check in and assessed for current level of functioning sxs management. Provided safe place to share thoughts and feelings regarding current stressors. Provided supportive feedback and validated feelings. Engaged Jomaira in exploring factors that have contributed to increase in low mood and anxiety. Explored feelings of grief and loss. Engaged in discussion of thoughts and beliefs about what the anniversary of Johnny Bridge would mean. Explored working to reframe and restructure thoughts and working to process feelings and emotion in healthy manner. Educated on concerns for drinking behaviors and explored supports needed to cease. Educated on slow increase in drinking behaviors and presence of over confidence of ability to self regulate drinking behaviors without sobriety. Discussed consequences of  onging excessive  drinking behaviors. Explored routine and working to engage in replacement behaviors and identifying small goals she is willing to engage in to support in increase in functioning. Reviewed session and provided follow up. Homework to identify routine. No safety concerns reported.  Review self-sabotage at next session and new txt goals to focus on healthy behaviors.   Plan: Return again in x 3 weeks.  Diagnosis: Bipolar 1 disorder, depressed (HCC)  Post-traumatic stress  Attention deficit hyperactivity disorder (ADHD), predominantly inattentive type  Collaboration of Care: Other None  Patient/Guardian was advised Release of Information must be obtained prior to any record release in order to collaborate their care with an outside provider. Patient/Guardian was advised if they have not already done so to contact the registration department to sign all necessary forms in order for [name] to release information regarding their care.   Consent: Patient/Guardian gives verbal consent for treatment and assignment of benefits for services provided during this visit. Patient/Guardian expressed understanding and agreed to proceed.   Stephan Minister Winthrop, [facility] 09/02/2022

## 2022-09-07 ENCOUNTER — Ambulatory Visit (INDEPENDENT_AMBULATORY_CARE_PROVIDER_SITE_OTHER): Payer: No Typology Code available for payment source | Admitting: Mental Health

## 2022-09-07 DIAGNOSIS — F4310 Post-traumatic stress disorder, unspecified: Secondary | ICD-10-CM

## 2022-09-07 DIAGNOSIS — F900 Attention-deficit hyperactivity disorder, predominantly inattentive type: Secondary | ICD-10-CM

## 2022-09-07 DIAGNOSIS — F319 Bipolar disorder, unspecified: Secondary | ICD-10-CM | POA: Diagnosis not present

## 2022-09-07 DIAGNOSIS — F411 Generalized anxiety disorder: Secondary | ICD-10-CM

## 2022-09-07 NOTE — Progress Notes (Unsigned)
THERAPIST PROGRESS NOTE Virtual Visit via Video Note  I connected with Kathryn [name] on 09/07/22 at  9:00 AM EDT by a video enabled telemedicine application and verified that I am speaking with the correct person using two identifiers.  Location: Patient: home address on file Provider: home office   I discussed the limitations of evaluation and management by telemedicine and the availability of in person appointments. The patient expressed understanding and agreed to proceed.  I discussed the assessment and treatment plan with the patient. The patient was provided an opportunity to ask questions and all were answered. The patient agreed with the plan and demonstrated an understanding of the instructions.   The patient was advised to call back or seek an in-person evaluation if the symptoms worsen or if the condition fails to improve as anticipated.  I provided 54 minutes of non-face-to-face time during this encounter.   Dorris Singh, [facility]  Session Time: 9:05 am ( 54 minutes)  Participation Level: Active  Behavioral Response: CasualAlertDysphoric  Type of Therapy: Individual Therapy  Treatment Goals addressed: STG: Sowmya will increase overall wellness/ development of healthy habits  AEB development of daily routine with improvement in physical health with reduced drinking and binge eating behaviors per self report within the next 90 days.    STG: Lynise will increase management of moods AEB development of effective coping skills with ability to explore and engage in enjoyable activities and self-care weekly withn the next 6 months   ProgressTowards Goals: Progressing  Interventions: CBT and Supportive  Summary:Kathryn [name] is a 27 y.o. female who presents with dx of bipolar d/o, PTSD, GAD and ADHD. Azka presents alert and oriented; mood and affect adequate. Speech clear and coherent at normal rate and tone. Engaged and receptive to interventions. Mood reported to be  stable. Denies excessive highs or lows. Notes some low mood but working to cope. Notes upcoming anniversary of Martha's passing but feels she is doing well. Shares events of presenting to home town and spending time with family. Shares thoughts on feeling like an outsider in her family and for the relationship with her sister to have seemed different. Shares unclear if this is her own overthinking behaviors or based on reality. Shares to have not drank as heavy while at home town. Shares would like to continue to reduce drinking behaviors and make other life style changes. Identifies desire to get more in shape/loose weight, decrease drinking and and save money. Able to engage with therapist and identify specific goals related to those behavioral changes she would like to see in herself. Shares plans to drink no more than x 1 weekly of 2 \\to  3 beers; start walking, explore ability to eat healthier meals and start saving money she would have spend on alcohol. Agrees to treatment plan update. Denies SI/HI. Progress on goals; sxs stable at this time.    Suicidal/Homicidal: Nowithout intent/plan  Therapist Response: Therapist engaged Zelene in [employer]. Confirmed current location and ability to hold confidential session. Therapist engaged Carolin in check in and assessed for current level of functioning sxs management. Provided safe place to share thoughts and feelings in regards to visit home and interaction with family members. Assessed for level of drinking behaviors and expressed concern for level of usage reported. Supported in identifying life style changes and behavioral changes to support increased healthy habits. Educated on need to set SMART goals and discussed concern for self-sabotage. Explored feelings of grief with anniversary and ability to process thoughts  in balanced manner. Reviewed session, provided follow up.   Plan: Return again in  x 2 weeks.  Diagnosis: Bipolar 1 disorder,  depressed (HCC)  Post-traumatic stress  GAD (generalized anxiety disorder)  Attention deficit hyperactivity disorder (ADHD), predominantly inattentive type  Collaboration of Care: Other None  Patient/Guardian was advised Release of Information must be obtained prior to any record release in order to collaborate their care with an outside provider. Patient/Guardian was advised if they have not already done so to contact the registration department to sign all necessary forms in order for [name] to release information regarding their care.   Consent: Patient/Guardian gives verbal consent for treatment and assignment of benefits for services provided during this visit. Patient/Guardian expressed understanding and agreed to proceed.   Stephan Minister Crandall, [facility] 09/07/2022

## 2022-09-21 ENCOUNTER — Ambulatory Visit (INDEPENDENT_AMBULATORY_CARE_PROVIDER_SITE_OTHER): Payer: No Typology Code available for payment source | Admitting: Mental Health

## 2022-09-21 DIAGNOSIS — F900 Attention-deficit hyperactivity disorder, predominantly inattentive type: Secondary | ICD-10-CM

## 2022-09-21 DIAGNOSIS — F411 Generalized anxiety disorder: Secondary | ICD-10-CM

## 2022-09-21 DIAGNOSIS — F4310 Post-traumatic stress disorder, unspecified: Secondary | ICD-10-CM

## 2022-09-21 DIAGNOSIS — F319 Bipolar disorder, unspecified: Secondary | ICD-10-CM

## 2022-09-21 NOTE — Progress Notes (Signed)
THERAPIST PROGRESS NOTE Virtual Visit via Video Note  I connected with [name] on 09/21/22 at  8:00 AM EDT by a video enabled telemedicine application and verified that I am speaking with the correct person using two identifiers.  Location: Patient: home address on fine Provider: home address    I discussed the limitations of evaluation and management by telemedicine and the availability of in person appointments. The patient expressed understanding and agreed to proceed.   I discussed the assessment and treatment plan with the patient. The patient was provided an opportunity to ask questions and all were answered. The patient agreed with the plan and demonstrated an understanding of the instructions.   The patient was advised to call back or seek an in-person evaluation if the symptoms worsen or if the condition fails to improve as anticipated.  I provided 50 minutes of non-face-to-face time during this encounter.   Dorris Singh, [facility]   Session Time: 8:05 am   Participation Level: Active  Behavioral Response: CasualAlertEuthymic  Type of Therapy: Individual Therapy  Treatment Goals addressed: STG: Lindsay will increase overall wellness/ development of healthy habits  AEB development of daily routine with improvement in physical health with reduced drinking and binge eating behaviors per self report within the next 90 days.     STG: Chablis will increase management of moods AEB development of effective coping skills with ability to explore and engage in enjoyable activities and self-care weekly withn the next 6 months   ProgressTowards Goals: Progressing  Interventions: CBT and Supportive  Summary: :Laurice Deleonardis is a 27 y.o. female who presents with dx of bipolar d/o, PTSD, GAD and ADHD. Dellora presents alert and oriented; mood and affect euthymic. Speech clear and coherent at normal rate and tone. Engaged and receptive to interventions. Mood reported to be  stable. Laughs and smiles. Denies present concerns for anxiety and depression. Notes periods of low mood but shares to cope well. Notes behavioral changes and working to stick to goals developed. Notes to have started to lift weights and shares hx of really loving to weight train in the past. Shares has been able to increase ability to eat at home and reduced eating out behavior. Shares has not been able to drink x 1 weekly however decreased amount of drinks when she drinks and does not drink to get drunk. Shares events of spending time with Martha's family for anniversary of her passing and shares to have coped well with this. Shares thoughts on relationship with sister and processes thoughts of relationship with sister and father. Plans to start school in Winter with acceptance to [employer]. Shares plans to continue to work on progress with goal of healthy habits. Presented to friends house past week and engaging in enjoyable activities . Moods stable. Progress with goals. No safety concerns reported.   Suicidal/Homicidal: Nowithout intent/plan  Therapist Response: Therapist engaged Lanyla in [employer]. Confirmed current location and ability to hold confidential session. Therapist engaged Seanna in check in and assessed for current level of functioning sxs management. Provided safe place to share thoughts and feelings and progress towards identified goals. Explored factors contributing to progress and areas for improvement and barriers placed. Engaged in encouraging balanced internal dialogue and giving self grace vs. Excuses. Supported in process thoughts of family and discussing family dynamics. Reviewed focusing on what she can control and continuing to be her authentic self with interactions with family. Explored mourning type of relationships she wished she had with sister and  father. Encouraged ongoing working towards goals and practicing positive affirmations. . Assessed for excessive highs or  lows. Reviewed session and provided follow up.   Plan: Return again in  x 2 weeks.  Diagnosis: Bipolar 1 disorder, depressed (HCC)  Post-traumatic stress  GAD (generalized anxiety disorder)  Attention deficit hyperactivity disorder (ADHD), predominantly inattentive type  Collaboration of Care: Other None  Patient/Guardian was advised Release of Information must be obtained prior to any record release in order to collaborate their care with an outside provider. Patient/Guardian was advised if they have not already done so to contact the registration department to sign all necessary forms in order for [name] to release information regarding their care.   Consent: Patient/Guardian gives verbal consent for treatment and assignment of benefits for services provided during this visit. Patient/Guardian expressed understanding and agreed to proceed.   Stephan Minister [place], [facility] 09/21/2022

## 2022-10-05 ENCOUNTER — Ambulatory Visit (INDEPENDENT_AMBULATORY_CARE_PROVIDER_SITE_OTHER): Payer: No Typology Code available for payment source | Admitting: Mental Health

## 2022-10-05 DIAGNOSIS — F411 Generalized anxiety disorder: Secondary | ICD-10-CM | POA: Diagnosis not present

## 2022-10-05 DIAGNOSIS — F319 Bipolar disorder, unspecified: Secondary | ICD-10-CM | POA: Diagnosis not present

## 2022-10-05 DIAGNOSIS — F4310 Post-traumatic stress disorder, unspecified: Secondary | ICD-10-CM

## 2022-10-05 NOTE — Progress Notes (Unsigned)
THERAPIST PROGRESS NOTE Virtual Visit via Video Note  I connected with Kathryn Kathryn [name] on 10/05/22 at  8:00 AM EDT by a video enabled telemedicine application and verified that I am speaking with the correct person using two identifiers.  Location: Patient: home address on file Provider: home office   I discussed the limitations of evaluation and management by telemedicine and the availability of in person appointments. The patient expressed understanding and agreed to proceed.  I discussed the assessment and treatment plan with the patient. The patient was provided an opportunity to ask questions and all were answered. The patient agreed with the plan and demonstrated an understanding of the instructions.   The patient was advised to call back or seek an in-person evaluation if the symptoms worsen or if the condition fails to improve as anticipated.  I provided 45 minutes of non-face-to-face time during this encounter.   Kathryn Kathryn [name], Kathryn Kathryn [name]   Session Time: 8:08 am (45 minutes)   Participation Level: Active  Behavioral Response: Fairly GroomedAlertEuthymic  Type of Therapy: Individual Therapy  Treatment Goals addressed: STG: Kathryn Kathryn [name] increase overall wellness/ development of healthy habits AEB development of daily routine with improvement in physical health with reduced drinking and binge eating behaviors per self report within the next 90 days.    STG: Kathryn Kathryn [name] increase management of moods AEB development of effective coping skills with ability to explore and engage in enjoyable activities and self-care weekly withn the next 6 months   ProgressTowards Goals: Progressing  Interventions: CBT and Supportive  Summary::Kathryn Kathryn [name] is a 27 y.o. female who Kathryn [name] with dx of bipolar d/o, PTSD, GAD and ADHD. Kathryn Kathryn [name]; mood and affect euthymic. Speech clear and coherent at normal rate and tone. Engaged and receptive to interventions. Mood  reported to be stable. Laughs and smiles. Denies present concerns for anxiety and depression. Notes periods of situational anxiety related to work. Shares concerns for others have difficulty with changes at work and thoughts of her being in the middle of discussions around said change. Shares progress with identified healthy habits and goals. Shares to have increased her water intake, saving money, working out  x 5 days a week and has started meal prepping and eating more nutritious foods. Shares onging drinking of approximately x 4 days however has found self throwing away beer that she does not end up drinking and identifies probable drinking out of habit. Shares would like to be a social drinker and steer from drinking after work. Shares to have also lost x 5 pounds. Denies binge eating. Shares engagement in enjoyable activity with seeing family with sister having another child and shares thoughts surrounding family,  "I can't let the way they treat me effect how I see myself." Notes thoughts of them looking at her differently since she choose to move away but feels this was a good choice for her. Notes difficulty with self esteem and seeing self in positive light. Identifies being caring and other positive attributes but struggles. Agrees to work to start to journal thoughts and identify her positive attributes.Denies SI/HI  Suicidal/Homicidal: Nowith intent/plan  Therapist Response: Therapist engaged Kathryn Kathryn [name] in [employer]. Confirmed current location and ability to hold confidential session. Therapist engaged Kathryn Kathryn [name] in check in and assessed for current level of functioning sxs management. Provided safe place to share thoughts and feelings and progress towards goals. Explored factors that have supported in making progress and presence of any barriers. Explored working towards maintaining and adding  for sustained change. Discussed working to challenge old ways of being and thinking and developing new  systems of belief of self and who she is. Supported in processing work related stressors and anxiety. Provided support and encouragement; validated feelings. Explored esteem and positive ways in which she views herself. Encouraged working to journal to review day, what she learned about herself and identifying positive attributes. Reviewed session and provided follow up. No safety concerns noted.   Plan: Return again in  x 3 weeks.  Diagnosis: Bipolar 1 disorder, depressed (HCC)  Post-traumatic stress  GAD (generalized anxiety disorder)  Collaboration of Care: Other None  Patient/Guardian was advised Release of Information must be obtained prior to any record release in order to collaborate their care with an outside provider. Patient/Guardian was advised if they have not already done so to contact the registration department to sign all necessary forms in order for [name] to release information regarding their care.   Consent: Patient/Guardian gives verbal consent for treatment and assignment of benefits for services provided during this visit. Patient/Guardian expressed understanding and agreed to proceed.   Stephan Minister [place], [name] 10/05/2022

## 2022-10-27 NOTE — Progress Notes (Unsigned)
      Established patient visit   Patient: Kathryn [name]   DOB: 09-Feb-1996   27 y.o. Female  MRN: [id number] Visit Date: 10/28/2022  Today's healthcare provider: Alfredia Ferguson, PA-C   No chief complaint on file.  Subjective    HPI  ***  Medications: Outpatient Medications Prior to Visit  Medication Sig   [START ON 11/08/2022] amphetamine-dextroamphetamine (ADDERALL) 12.5 MG tablet Take 2 tablets by mouth 2 (two) times daily.   amphetamine-dextroamphetamine (ADDERALL) 12.5 MG tablet Take 2 tablets by mouth 2 (two) times daily.   amphetamine-dextroamphetamine (ADDERALL) 12.5 MG tablet Take 2 tablets by mouth 2 (two) times daily.   ferrous sulfate 325 (65 FE) MG tablet Take 325 mg by mouth daily with breakfast.   lamoTRIgine (LAMICTAL) 200 MG tablet Take 1 tablet (200 mg total) by mouth at bedtime. Take one a day   Levonorgestrel-Ethinyl Estradiol (AMETHIA) 0.15-0.03 &0.01 MG tablet Take 1 tablet by mouth daily.   omeprazole (PRILOSEC) 40 MG capsule Take 1 capsule (40 mg total) by mouth 2 (two) times daily.   risperiDONE (RISPERDAL) 1 MG tablet Take 1 tablet (1 mg total) by mouth at bedtime.   valACYclovir (VALTREX) 1000 MG tablet Take 1 tablet (1,000 mg total) by mouth daily.   venlafaxine XR (EFFEXOR-XR) 75 MG 24 hr capsule TAKE ONE CAPSULE BY MOUTH DAILY  WITH BREAKFAST   vitamin B-12 (CYANOCOBALAMIN) 500 MCG tablet Take 500 mcg by mouth daily.   No facility-administered medications prior to visit.    Review of Systems {Insert previous labs (optional):23779} {See past labs  Heme  Chem  Endocrine  Serology  Results Review (optional):1}   Objective    There were no vitals taken for this visit. {Insert last BP/Wt (optional):23777}{See vitals history (optional):1}  Physical Exam  ***  No results found for any visits on 10/28/22.  Assessment & Plan     ***  No follow-ups on file.      {provider attestation***:1}   Alfredia Ferguson, PA-C  [facility]  Primary Care at [facility] [phone] (phone) [phone] (fax)  [facility]

## 2022-10-28 ENCOUNTER — Ambulatory Visit: Payer: No Typology Code available for payment source | Admitting: Physician Assistant

## 2022-10-28 ENCOUNTER — Encounter: Payer: Self-pay | Admitting: Physician Assistant

## 2022-10-28 VITALS — BP 118/84 | HR 81 | Temp 98.3°F | Resp 20 | Wt 204.2 lb

## 2022-10-28 DIAGNOSIS — R6889 Other general symptoms and signs: Secondary | ICD-10-CM | POA: Diagnosis not present

## 2022-10-28 DIAGNOSIS — J069 Acute upper respiratory infection, unspecified: Secondary | ICD-10-CM | POA: Diagnosis not present

## 2022-10-28 DIAGNOSIS — U071 COVID-19: Secondary | ICD-10-CM | POA: Diagnosis not present

## 2022-10-28 LAB — POC COVID19 BINAXNOW: SARS Coronavirus 2 Ag: POSITIVE — AB

## 2022-10-28 LAB — POCT INFLUENZA A/B
Influenza A, POC: NEGATIVE
Influenza B, POC: NEGATIVE

## 2022-11-02 ENCOUNTER — Ambulatory Visit (INDEPENDENT_AMBULATORY_CARE_PROVIDER_SITE_OTHER): Payer: No Typology Code available for payment source | Admitting: Mental Health

## 2022-11-02 DIAGNOSIS — F4310 Post-traumatic stress disorder, unspecified: Secondary | ICD-10-CM

## 2022-11-02 DIAGNOSIS — F319 Bipolar disorder, unspecified: Secondary | ICD-10-CM

## 2022-11-02 DIAGNOSIS — F900 Attention-deficit hyperactivity disorder, predominantly inattentive type: Secondary | ICD-10-CM

## 2022-11-02 NOTE — Progress Notes (Signed)
THERAPIST PROGRESS NOTE Virtual Visit via Video Note  I connected with Kathryn [name] on 11/02/22 at  8:00 AM EDT by a video enabled telemedicine application and verified that I am speaking with the correct person using two identifiers.  Location: Patient: home address on file Provider: home office   I discussed the limitations of evaluation and management by telemedicine and the availability of in person appointments. The patient expressed understanding and agreed to proceed.  I discussed the assessment and treatment plan with the patient. The patient was provided an opportunity to ask questions and all were answered. The patient agreed with the plan and demonstrated an understanding of the instructions.   The patient was advised to call back or seek an in-person evaluation if the symptoms worsen or if the condition fails to improve as anticipated.  I provided 53 minutes of non-face-to-face time during this encounter.   Dorris Singh, [facility]   Session Time: 8:05 am ( 53 minutes)  Participation Level: Active  Behavioral Response: CasualAlertAdequate  Type of Therapy: Individual Therapy  Treatment Goals addressed: STG: Kathryn [name] will increase overall wellness/ development of healthy habits AEB development of daily routine with improvement in physical health with reduced drinking and binge eating behaviors per self report within the next 90 days.     STG: Kathryn [name] will increase management of moods AEB development of effective coping skills with ability to explore and engage in enjoyable activities and self-care weekly withn the next 6 months   ProgressTowards Goals: Progressing  Interventions: CBT and Supportive  Summary: Kathryn [name] is a 27 y.o. female who presents with dx of bipolar d/o, PTSD, GAD and ADHD. Kathryn [name] presents alert and oriented; mood and affect euthymic. Speech clear and coherent at normal rate and tone. Engaged and receptive to interventions. Mood reported to  be stable. Laughs and smiles with therapist. Kathryn [name] to be "doing" and notes concern for medical health at this time sharing to have stoamch issues and shares plans of following up with providers, noting frequent bowel movements and vomiting. Reports for moods to have been stable and shares has been maintaining healthy life style changes with no alcohol in the past x 7 days, with last use of x 2 shots in the sitting, increase in cooked meals with reduced eating out. Shares has started to work back (ceased due to illness). Processing with therapist concerning self talk and difficulty accepting compliments from others. Shares for negative commentary to feel more familiar and shares childhood of father speaking very negatively towards her. Shares thoughts of positive friendship she holds and thoughts of lines blurred on nature of relationship. Agrees to move forward being mindful of boundaries. Denies SI/HI/AVH. Progress with goals.   Suicidal/Homicidal: Nowithout intent/plan  Therapist Response:  Therapist engaged Kathryn [name] in [employer]. Confirmed current location and ability to hold confidential session. Therapist engaged Kathryn [name] in check in and assessed for current level of functioning sxs management. Provided safe place to share thoughts and feelings and progress towards goals. Supported in processing ability to maintain behavioral changes to support in increase healthy habits and quality of life. Explored with Kathryn [name] reported friendship and processed boundaries with self and others and being mindful of emotional boundaries. Processed working to be accepting of complaints and internal dialogue of self. Explored expectations of self and ability to give self grace vs. Strict time lines for things in which she may not have full control. Reviewed session and provided follow up. No safety concerns reported.   Plan: Return  again in  x 3 weeks.  Diagnosis: Bipolar 1 disorder, depressed  (HCC)  Post-traumatic stress  Attention deficit hyperactivity disorder (ADHD), predominantly inattentive type  Collaboration of Care: Other None  Patient/Guardian was advised Release of Information must be obtained prior to any record release in order to collaborate their care with an outside provider. Patient/Guardian was advised if they have not already done so to contact the registration department to sign all necessary forms in order for [name] to release information regarding their care.   Consent: Patient/Guardian gives verbal consent for treatment and assignment of benefits for services provided during this visit. Patient/Guardian expressed understanding and agreed to proceed.   Stephan Minister [place], [facility] 11/02/2022

## 2022-11-06 ENCOUNTER — Ambulatory Visit (HOSPITAL_COMMUNITY): Payer: No Typology Code available for payment source | Admitting: Mental Health

## 2022-11-06 DIAGNOSIS — F319 Bipolar disorder, unspecified: Secondary | ICD-10-CM | POA: Diagnosis not present

## 2022-11-06 DIAGNOSIS — F900 Attention-deficit hyperactivity disorder, predominantly inattentive type: Secondary | ICD-10-CM

## 2022-11-06 DIAGNOSIS — F4310 Post-traumatic stress disorder, unspecified: Secondary | ICD-10-CM

## 2022-11-06 NOTE — Progress Notes (Unsigned)
THERAPIST PROGRESS NOTE Virtual Visit via Video Note  I connected with Kathryn [name] on 11/06/22 at  1:00 PM EDT by a video enabled telemedicine application and verified that I am speaking with the correct person using two identifiers.  Location: Patient: home address on file Provider: office   I discussed the limitations of evaluation and management by telemedicine and the availability of in person appointments. The patient expressed understanding and agreed to proceed.  I discussed the assessment and treatment plan with the patient. The patient was provided an opportunity to ask questions and all were answered. The patient agreed with the plan and demonstrated an understanding of the instructions.   The patient was advised to call back or seek an in-person evaluation if the symptoms worsen or if the condition fails to improve as anticipated.  I provided 45 minutes of non-face-to-face time during this encounter.   Dorris Singh, [facility]   Session Time: 1:06 pm ( 45 minutes)   Participation Level: Active  Behavioral Response: CasualAlertAnxious  Type of Therapy: Individual Therapy  Treatment Goals addressed: STG: Kathryn [name] will increase overall wellness/ development of healthy habits AEB development of daily routine with improvement in physical health with reduced drinking and binge eating behaviors per self report within the next 90 days.     STG: Kathryn [name] will increase management of moods AEB development of effective coping skills with ability to explore and engage in enjoyable activities and self-care weekly withn the next 6 months   ProgressTowards Goals: Progressing  Interventions: CBT and Supportive  Summary: Kathryn [name] is a 27 y.o. female who presents with dx of bipolar d/o, PTSD, GAD and ADHD. Kathryn [name] presents alert and oriented; mood and affect anxious. Speech clear and coherent at normal rate and tone. Engaged and receptive to interventions. Shares with therapist  feelings of anxiety and agitation related to work. Shares with therapist recent events that occur at work and need to take x 2 days off following event. Shares thoughts on not feeling supported at work and awareness of place of employment to not do things in which are align with best practice. Shares feelings of frustration with work; " I am fed [employer] using skills of walking away in times of frustration at work to clear thoughts and get some air and not act out on feelings, noting " I know I can have big feelings" and do something impulsive. Shares to have given some thought and desires to quit position. 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 Shares to have signed up for classes and due to start 10/16. Shares have discussed thoughts and plans with trusted friend and wanted to discuss with therapist. Kathryn [name] desire to take break from field and does not want current employment to effect her desire to move forward in the field. Shares to be doing well adherenting to healthy habits with plans to purchase a fridge for her living space to increase attendance to healthier diet. Shares episodes of drinking with no more than x 2 drinks. Denies safety concerns. Ongoing progress with goals.    Suicidal/Homicidal: Nowithout intent/plan  Therapist Response: Therapist engaged Kathryn [name] in [employer]. Confirmed current location and ability to hold confidential session. Therapist engaged Kathryn [name] in check in and assessed for current level of functioning sxs management. Provided safe place to share thoughts and feelings in regard to recent stressors. Active empathic listening; provided support and encouragement. Explored with Kathryn [name] ability to regulate emotions in times  of distress and engage in self-soothing. Explored with Kathryn [name] thoughts on employment current financial standing and exploring her options. Affirmed ability to handle situation in calm fashion. Reviewed  progress on healthy goals. Provided feedback and reviewed session. No safety concerns reported.   Plan: Return again in  x 3 weeks.  Diagnosis: Bipolar 1 disorder, depressed (HCC)  Post-traumatic stress  Attention deficit hyperactivity disorder (ADHD), predominantly inattentive type  Collaboration of Care: Other None  Patient/Guardian was advised Release of Information must be obtained prior to any record release in order to collaborate their care with an outside provider. Patient/Guardian was advised if they have not already done so to contact the registration department to sign all necessary forms in order for [name] to release information regarding their care.   Consent: Patient/Guardian gives verbal consent for treatment and assignment of benefits for services provided during this visit. Patient/Guardian expressed understanding and agreed to proceed.   Stephan Minister [place], [facility] 11/06/2022

## 2022-11-12 ENCOUNTER — Telehealth (HOSPITAL_COMMUNITY): Payer: Self-pay | Admitting: Mental Health

## 2022-11-12 NOTE — Telephone Encounter (Signed)
Therapist returned call. Discussed concerns with pt and provided feedback. Reminded of upcoming appointment. No safety concerns reported. Pt thanked therapist for call.

## 2022-11-23 ENCOUNTER — Ambulatory Visit (INDEPENDENT_AMBULATORY_CARE_PROVIDER_SITE_OTHER): Payer: No Typology Code available for payment source | Admitting: Mental Health

## 2022-11-23 DIAGNOSIS — F411 Generalized anxiety disorder: Secondary | ICD-10-CM

## 2022-11-23 DIAGNOSIS — F4310 Post-traumatic stress disorder, unspecified: Secondary | ICD-10-CM

## 2022-11-23 DIAGNOSIS — F900 Attention-deficit hyperactivity disorder, predominantly inattentive type: Secondary | ICD-10-CM

## 2022-11-23 DIAGNOSIS — F319 Bipolar disorder, unspecified: Secondary | ICD-10-CM | POA: Diagnosis not present

## 2022-11-23 NOTE — Progress Notes (Signed)
THERAPIST PROGRESS NOTE Virtual Visit via Video Note  I connected with Kathryn [name] on 11/23/22 at  8:00 AM EDT by a video enabled telemedicine application and verified that I am speaking with the correct person using two identifiers.  Location: Patient: home address on file Provider: home office   I discussed the limitations of evaluation and management by telemedicine and the availability of in person appointments. The patient expressed understanding and agreed to proceed.  I discussed the assessment and treatment plan with the patient. The patient was provided an opportunity to ask questions and all were answered. The patient agreed with the plan and demonstrated an understanding of the instructions.   The patient was advised to call back or seek an in-person evaluation if the symptoms worsen or if the condition fails to improve as anticipated.  I provided 44 minutes of non-face-to-face time during this encounter.   Dorris Singh, [facility]   Session Time: 8:0 6 am   Participation Level: Active  Behavioral Response: CasualAlertEuthymic  Type of Therapy: Individual Therapy  Treatment Goals addressed:  STG: Kathryn [name] will increase overall wellness/ development of healthy habits AEB development of daily routine with improvement in physical health with reduced drinking and binge eating behaviors per self report within the next 90 days.    STG: Kathryn [name] will increase management of moods AEB development of effective coping skills with ability to explore and engage in enjoyable activities and self-care weekly withn the next 6 months     ProgressTowards Goals: Progressing  Interventions: CBT and Supportive  Summary: Kathryn [name] is a 27 y.o. female who presents with dx of bipolar d/o, PTSD, GAD and ADHD. Kathryn [name] presents alert and oriented; mood and affect stable; euthymic. Speech clear and coherent at normal rate and tone. Engaged and receptive to interventions. Shares with  therapist for feelings of anxiety to have calmed and reports working to manage stressors related to work. " I am not going to worry about what other people do." Shares has been able to reframe and process thoughts in regards to worries about work. States would still like to find new employment but is not rushing. Shares ongoing work towards progress with personal goals, continues to drink water, eat healthier meals and engage in exercise. States reduced alcohol use and shares x 1 event of socializing with others in which she did become drunk. Shares thoughts and feelings in regard to interaction with married friend with her disclosing to have feelings for her. Stating discussing on boundaries and friend needing to discuss with her therapist as well as other supports. Shares feelings of excitement for school and future focused. Denies safety concerns. Ongoing progress with goals. Sxs stable.   Suicidal/Homicidal: Nowithout intent/plan  Therapist Response: Therapist engaged Kathryn [name] in [employer]. Confirmed current location and ability to hold confidential session. Therapist engaged Kathryn [name] in check in and assessed for current level of functioning sxs management. Provided safe place to share thoughts in regards to work and ability to process concerns in balanced manner. Reviewed working to make decisions that are best for her and not engaging in emotional or impulsive decision making. Provided support in regards to friendship in which feelings have been discussed and ability to engage in wise mind decision making provided circumstances. Assessed for progress with increasing healthy lifestyle and habits. Reviewed session and provided follow up.   Plan: Return again in  x 4 weeks.  Diagnosis: Bipolar 1 disorder, depressed (HCC)  Post-traumatic stress  GAD (generalized anxiety disorder)  Attention deficit hyperactivity disorder (ADHD), predominantly inattentive type  Collaboration of Care: Other  None  Patient/Guardian was advised Release of Information must be obtained prior to any record release in order to collaborate their care with an outside provider. Patient/Guardian was advised if they have not already done so to contact the registration department to sign all necessary forms in order for [name] to release information regarding their care.   Consent: Patient/Guardian gives verbal consent for treatment and assignment of benefits for services provided during this visit. Patient/Guardian expressed understanding and agreed to proceed.   Stephan Minister [place], [facility] 11/23/2022

## 2022-11-27 ENCOUNTER — Encounter (HOSPITAL_COMMUNITY): Payer: Self-pay | Admitting: Student in an Organized Health Care Education/Training Program

## 2022-11-27 ENCOUNTER — Telehealth (INDEPENDENT_AMBULATORY_CARE_PROVIDER_SITE_OTHER)
Payer: No Typology Code available for payment source | Admitting: Student in an Organized Health Care Education/Training Program

## 2022-11-27 DIAGNOSIS — F411 Generalized anxiety disorder: Secondary | ICD-10-CM | POA: Diagnosis not present

## 2022-11-27 DIAGNOSIS — F319 Bipolar disorder, unspecified: Secondary | ICD-10-CM

## 2022-11-27 DIAGNOSIS — F900 Attention-deficit hyperactivity disorder, predominantly inattentive type: Secondary | ICD-10-CM | POA: Diagnosis not present

## 2022-11-27 MED ORDER — AMPHETAMINE-DEXTROAMPHETAMINE 12.5 MG PO TABS
25.0000 mg | ORAL_TABLET | Freq: Two times a day (BID) | ORAL | 0 refills | Status: DC
Start: 2023-01-08 — End: 2023-03-19

## 2022-11-27 MED ORDER — AMPHETAMINE-DEXTROAMPHETAMINE 12.5 MG PO TABS
25.0000 mg | ORAL_TABLET | Freq: Two times a day (BID) | ORAL | 0 refills | Status: DC
Start: 2023-02-05 — End: 2023-02-02

## 2022-11-27 MED ORDER — RISPERIDONE 1 MG PO TABS
1.0000 mg | ORAL_TABLET | Freq: Every day | ORAL | 2 refills | Status: DC
Start: 2022-11-27 — End: 2023-02-02

## 2022-11-27 MED ORDER — LAMOTRIGINE 200 MG PO TABS: 200.0000 mg | ORAL_TABLET | Freq: Every day | ORAL | 2 refills | Status: DC

## 2022-11-27 MED ORDER — AMPHETAMINE-DEXTROAMPHETAMINE 12.5 MG PO TABS
25.0000 mg | ORAL_TABLET | Freq: Two times a day (BID) | ORAL | 0 refills | Status: DC
Start: 2022-12-11 — End: 2023-03-19

## 2022-11-27 MED ORDER — VENLAFAXINE HCL ER 75 MG PO CP24
ORAL_CAPSULE | ORAL | 2 refills | Status: DC
Start: 2022-11-27 — End: 2023-02-02

## 2022-11-27 NOTE — Progress Notes (Signed)
BH MD/PA/NP OP Progress Note  Virtual Visit via Video Note  I connected with Kathryn [name] on 11/27/22 at 10:30 AM EDT by a video enabled telemedicine application and verified that I am speaking with the correct person using two identifiers.  Location: Patient: Work  Provider: [facility]   I discussed the limitations of evaluation and management by telemedicine and the availability of in person appointments. The patient expressed understanding and agreed to proceed.    11/27/2022 10:43 AM Kathryn [name]  MRN:  [id number]  Chief Complaint:  Chief Complaint  Patient presents with  . Follow-up  . Medication Refill   HPI:  Kathryn [name] is a 27 yr old female who presents for Follow Up and Medication Management. PPHx is significant for Bipolar Disorder, GAD, PTSD, and ADHD.  She reports that she has been doing good since her last appointment.  She reports that she "feels normal whatever that means."  She reports this is the most stable she has ever been since being on medications.  She reports that a few weeks ago she did have one tough week due to stress from work.  She reports that she was able to work with her therapist and overcome it and has not had issues since.  She reports her mood has remained stable.  She reports no side effects to her medications.  She reports that she has quit smoking as of 9 days ago.  She reports she started smoking at age 76 so it has been a little difficult but she is having less frequent cravings.  Discussed with her that since she is doing so well we would not make any changes to her medication at this time and she was agreeable with this.  She reports no SI, HI, or AVH.  She reports sleep is good.  She reports her appetite is doing good.  She reports no other concerns at present.  She returned follow-up in approximately 3 months.    Visit Diagnosis:    ICD-10-CM   1. Attention deficit hyperactivity disorder (ADHD), predominantly inattentive type  F90.0  amphetamine-dextroamphetamine (ADDERALL) 12.5 MG tablet    amphetamine-dextroamphetamine (ADDERALL) 12.5 MG tablet    amphetamine-dextroamphetamine (ADDERALL) 12.5 MG tablet    2. Bipolar 1 disorder, depressed (HCC)  F31.9 venlafaxine XR (EFFEXOR-XR) 75 MG 24 hr capsule    risperiDONE (RISPERDAL) 1 MG tablet    lamoTRIgine (LAMICTAL) 200 MG tablet    3. GAD (generalized anxiety disorder)  F41.1 venlafaxine XR (EFFEXOR-XR) 75 MG 24 hr capsule        Past Psychiatric History: Bipolar Disorder, GAD, PTSD, and ADHD.   Past Medical History:  Past Medical History:  Diagnosis Date  . Abnormal liver function tests 02/02/2016  . ADHD 06/10/2016  . Alcohol use 02/23/2016  . Allergy   . Anxiety   . Arm fracture, right    as child, fx in three places, no surgery  . Depression   . Depression with anxiety 04-30-14   PTSD s/p death of best friend   . Essential hypertension 02/02/2016  . GERD (gastroesophageal reflux disease)   . 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 Headache 04/09/2016  . Hyperlipidemia, mixed 02/02/2016  . Migraine   . Ovarian cyst   . PCOS (polycystic ovarian syndrome)   . Preventative health care 10/27/2015  . Thrombocytosis 04/09/2016  . Vaginal Pap smear, abnormal    HGSIL    Past Surgical History:  Procedure Laterality Date  . ADENOIDECTOMY  2002  . TYMPANOSTOMY TUBE  PLACEMENT Bilateral 1999    Family Psychiatric History: Maternal Aunt- Anxiety Maternal Grandfather- Anxiety Mother- Anxiety and OCD  Family History:  Family History  Problem Relation Age of Onset  . Hypertension Mother   . Heart disease Mother        CAD, stent at 7, s/p cardiac arrest with Defib  . Hyperlipidemia Mother   . Diabetes Mother        s/p gastric bypass  . Obesity Mother        s/p gastric bypass  . OCD Mother   . Hyperlipidemia Father   . Hypertension Father   . Anxiety disorder Father   . Asthma Sister   . Hyperlipidemia Brother   . Anxiety disorder Brother   . Stroke  Maternal Grandmother   . Thyroid disease Maternal Grandmother   . Anxiety disorder Maternal Grandmother   . Heart disease Maternal Grandfather        MI first at 53, s/p stents and CABG  . Hyperlipidemia Maternal Grandfather   . Hypertension Maternal Grandfather   . Stroke Maternal Grandfather   . Cancer Maternal Grandfather        melanoma  . Aneurysm Maternal Grandfather        brain  . Kidney disease Maternal Grandfather        tumor  . Heart disease Paternal Grandmother   . Cancer Paternal [place] and brain  . Heart disease Maternal Uncle   . Vision loss Maternal Uncle   . Anxiety disorder Maternal Uncle   . Anxiety disorder Maternal Aunt   . Depression Maternal Aunt     Social History:  Social History   Socioeconomic History  . Marital status: Single    Spouse name: Not on file  . Number of children: 0  . Years of education: 35  . Highest education level: Not on file  Occupational History    Comment: [employer]  Tobacco Use  . Smoking status: Heavy Smoker    Current packs/day: 1.50    Types: Cigarettes  . Smokeless tobacco: Former    Types: Snuff  Vaping Use  . Vaping status: Never Used  Substance and Sexual Activity  . Alcohol use: Yes    Comment: maybe every 6 months  . Drug use: Yes    Types: Marijuana    Comment: daily  . Sexual activity: Yes    Partners: Male    Birth control/protection: Pill  Other Topics Concern  . Not on file  Social History Narrative  . Not on file   Social Determinants of Health   Financial Resource Strain: Medium Risk (01/16/2022)   Overall Financial Resource Strain (CARDIA)   . Difficulty of Paying Living Expenses: Somewhat hard  Food Insecurity: No Food Insecurity (01/16/2022)   Hunger Vital Sign   . Worried About Programme researcher, broadcasting/film/video in the Last Year: Never true   . Ran Out of Food in the Last Year: Never true  Transportation Needs: No Transportation Needs (01/16/2022)   PRAPARE - Transportation   .  Lack of Transportation (Medical): No   . Lack of Transportation (Non-Medical): No  Physical Activity: Inactive (01/16/2022)   Exercise Vital Sign   . Days of Exercise per Week: 0 days   . Minutes of Exercise per Session: 0 min  Stress: No Stress Concern Present (01/16/2022)   [employer] of Occupational Health - Occupational Stress Questionnaire   . Feeling of Stress : Only a  little  Social Connections: Socially Isolated (01/16/2022)   Social Connection and Isolation Panel [NHANES]   . Frequency of Communication with Friends and Family: More than three times a week   . Frequency of Social Gatherings with Friends and Family: Never   . Attends Religious Services: Never   . Active Member of Clubs or Organizations: No   . Attends Banker Meetings: Never   . Marital Status: Never married    Allergies:  Allergies  Allergen Reactions  . Oxycodone Nausea Only, Nausea And Vomiting and Other (See Comments)    Oxycontin also Oxycontin also Oxycontin also Oxycontin also   . Quetiapine Fumarate Other (See Comments)    Mental status changes Mental status changes Mental status changes Mental status changes     Metabolic Disorder Labs: Lab Results  Component Value Date   HGBA1C 5.2 11/17/2018   No results found for: "PROLACTIN" Lab Results  Component Value Date   CHOL 222 (H) 06/18/2021   TRIG 72.0 06/18/2021   HDL 56.90 06/18/2021   CHOLHDL 4 06/18/2021   VLDL 14.4 06/18/2021   LDLCALC 150 (H) 06/18/2021   LDLCALC 166 (H) 11/17/2018   Lab Results  Component Value Date   TSH 0.735 11/17/2018   TSH 2.49 05/05/2017    Therapeutic Level Labs: No results found for: "LITHIUM" No results found for: "VALPROATE" No results found for: "CBMZ"  Current Medications: Current Outpatient Medications  Medication Sig Dispense Refill  . [START ON 12/11/2022] amphetamine-dextroamphetamine (ADDERALL) 12.5 MG tablet Take 2 tablets by mouth 2 (two) times daily. 120 tablet  0  . [START ON 01/08/2023] amphetamine-dextroamphetamine (ADDERALL) 12.5 MG tablet Take 2 tablets by mouth 2 (two) times daily. 120 tablet 0  . [START ON 02/05/2023] amphetamine-dextroamphetamine (ADDERALL) 12.5 MG tablet Take 2 tablets by mouth 2 (two) times daily. 120 tablet 0  . ferrous sulfate 325 (65 FE) MG tablet Take 325 mg by mouth daily with breakfast.    . lamoTRIgine (LAMICTAL) 200 MG tablet Take 1 tablet (200 mg total) by mouth at bedtime. Take one a day 30 tablet 2  . Levonorgestrel-Ethinyl Estradiol (AMETHIA) 0.15-0.03 &0.01 MG tablet Take 1 tablet by mouth daily. 91 tablet 4  . omeprazole (PRILOSEC) 40 MG capsule Take 1 capsule (40 mg total) by mouth 2 (two) times daily. 180 capsule 3  . risperiDONE (RISPERDAL) 1 MG tablet Take 1 tablet (1 mg total) by mouth at bedtime. 30 tablet 2  . valACYclovir (VALTREX) 1000 MG tablet Take 1 tablet (1,000 mg total) by mouth daily. 90 tablet 4  . venlafaxine XR (EFFEXOR-XR) 75 MG 24 hr capsule TAKE ONE CAPSULE BY MOUTH DAILY  WITH BREAKFAST 30 capsule 2  . vitamin B-12 (CYANOCOBALAMIN) 500 MCG tablet Take 500 mcg by mouth daily.     No current facility-administered medications for this visit.     Musculoskeletal: Strength & Muscle Tone: within normal limits Gait & Station:  Sitting During Interview Patient leans: N/A  Psychiatric Specialty Exam: Review of Systems  Respiratory:  Negative for shortness of breath.   Cardiovascular:  Negative for chest pain.  Gastrointestinal:  Negative for abdominal pain, constipation, diarrhea, nausea and vomiting.  Neurological:  Negative for dizziness, weakness and headaches.  Psychiatric/Behavioral:  Negative for dysphoric mood, hallucinations, sleep disturbance and suicidal ideas. The patient is not nervous/anxious.     There were no vitals taken for this visit.There is no height or weight on file to calculate BMI.  General Appearance: Casual and Fairly Groomed  Eye Contact:  Good  Speech:  Clear  and Coherent and Normal Rate  Volume:  Normal  Mood:   "ok"  Affect:  Appropriate and Congruent  Thought Process:  Coherent and Goal Directed  Orientation:  Full (Time, Place, and Person)  Thought Content: WDL and Logical   Suicidal Thoughts:  No  Homicidal Thoughts:  No  Memory:  Immediate;   Good Recent;   Good  Judgement:  Good  Insight:  Good  Psychomotor Activity:  Normal  Concentration:  Concentration: Good and Attention Span: Good  Recall:  Good  Fund of Knowledge: Good  Language: Good  Akathisia:  Negative  Handed:  Right  AIMS (if indicated): not done  Assets:  Communication Skills Desire for Improvement Housing Physical Health Resilience  ADL's:  Intact  Cognition: WNL  Sleep:  Good   Screenings: AUDIT    Advertising copywriter from 01/16/2022 in [facility]  Alcohol Use Disorder Identification Test Final Score (AUDIT) 8      GAD-7    Flowsheet Row Office Visit from 04/24/2022 in [facility] from 01/16/2022 in [facility] Video Visit from 05/27/2021 in [facility] Video Visit from 03/13/2021 in [facility] Video Visit from 12/19/2020 in [facility]  Total GAD-7 Score 0 7 3 1 2       PHQ2-9    Flowsheet Row Office Visit from 04/24/2022 in [facility] [place] Counselor from 01/16/2022 in [facility] Video Visit from 05/27/2021 in [facility] Video Visit from 03/13/2021 in [facility] Video Visit from 12/19/2020 in [facility]  PHQ-2 Total Score 0 5 3 2 1   PHQ-9 Total Score 2 14 6 7 2       Flowsheet Row Counselor from 01/16/2022 in [facility] Video Visit from 05/27/2021 in [facility]  C-SSRS RISK CATEGORY Low Risk No Risk        Assessment and Plan:  Kathryn [name] is a 27 yr old female who presents for Follow Up and Medication Management. PPHx is significant for Bipolar Disorder, GAD, PTSD, and ADHD.   Kathryn [name] has continued to do well on her medications.  We will not make any changes to her medications at this time.  Refills were sent in.  She will return for follow-up in approximately 3 months.   Bipolar 1 disorder, depressed  GAD -Continue Effexor XR 75 mg daily for depression and anxiety.  30 tablets with 2 refills. -Continue Risperdal 1 mg QHS for mood stability.  30 tablets with 2 refills. -Continue Lamictal 200 mg QHS for mood stability.  30 tablets with 2 refills.     ADHD -Continue Adderall 20 mg BID.  120 (12.5 mg) tablets with 2 refills.  Last fill 9/28 so First fill 10/25    Collaboration of Care: Collaboration of Care: Other provider involved in patient's care AEB [facility] Therapist  Patient/Guardian was advised Release of Information must be obtained prior to any record release in order to collaborate their care with an outside provider. Patient/Guardian was advised if they have not already done so to contact the registration department to sign all necessary forms in order for [name] to release information regarding their care.   Consent: Patient/Guardian gives verbal consent for treatment and assignment of benefits for services  provided during this visit. Patient/Guardian expressed understanding and agreed to proceed.    Lauro Franklin, MD 11/27/2022, 10:44 AM   Follow Up Instructions:    I discussed the assessment and treatment plan with the patient. The patient was provided an opportunity to ask questions and all were answered. The patient agreed with the plan and demonstrated an understanding of the instructions.   The patient was advised to call back or seek an in-person evaluation if the symptoms worsen or if the condition fails to  improve as anticipated.  I provided 6 minutes of non-face-to-face time during this encounter.   Lauro Franklin, MD

## 2022-12-14 ENCOUNTER — Ambulatory Visit (INDEPENDENT_AMBULATORY_CARE_PROVIDER_SITE_OTHER): Payer: No Typology Code available for payment source | Admitting: Mental Health

## 2022-12-14 DIAGNOSIS — F900 Attention-deficit hyperactivity disorder, predominantly inattentive type: Secondary | ICD-10-CM

## 2022-12-14 DIAGNOSIS — F319 Bipolar disorder, unspecified: Secondary | ICD-10-CM

## 2022-12-14 DIAGNOSIS — F4310 Post-traumatic stress disorder, unspecified: Secondary | ICD-10-CM

## 2022-12-14 DIAGNOSIS — F411 Generalized anxiety disorder: Secondary | ICD-10-CM

## 2022-12-14 NOTE — Progress Notes (Signed)
THERAPIST PROGRESS NOTE Virtual Visit via Video Note  I connected with Kathryn [name] on 12/14/22 at  8:00 AM EDT by a video enabled telemedicine application and verified that I am speaking with the correct person using two identifiers.  Location: Patient: home address on file Provider: home office   I discussed the limitations of evaluation and management by telemedicine and the availability of in person appointments. The patient expressed understanding and agreed to proceed.  I discussed the assessment and treatment plan with the patient. The patient was provided an opportunity to ask questions and all were answered. The patient agreed with the plan and demonstrated an understanding of the instructions.   The patient was advised to call back or seek an in-person evaluation if the symptoms worsen or if the condition fails to improve as anticipated.  I provided 50 minutes of non-face-to-face time during this encounter.   Dorris Singh, [facility]   Session Time: 8:06 am (50 minutes)  Participation Level: Active  Behavioral Response: Fairly GroomedAlertAnxious  Type of Therapy: Individual Therapy  Treatment Goals addressed: STG: Kashana will increase overall wellness/ development of healthy habits AEB development of daily routine with improvement in physical health with reduced drinking and binge eating behaviors per self report within the next 90 days.     STG: Nylayah will increase management of moods AEB development of effective coping skills with ability to explore and engage in enjoyable activities and self-care weekly withn the next 6 months   ProgressTowards Goals: Progressing  Interventions: CBT and Supportive  Summary: Kathryn [name] is a 27 y.o. female who presents with dx of bipolar d/o, PTSD, GAD and ADHD. Kathryn [name] presents alert and oriented; mood and affect stable; euthymic. Speech clear and coherent at normal rate and tone. Engaged and receptive to interventions.  Shares with therapist recent events of deciding to quit her position and shares evnets that lead up to choice. Notes concern for the children and lacking of ethical choices in current facilities. Shares upcoming position at new place of employment. Explored with therapist being in relationship with new partner and express concerns for moving in. Shares awnts to be thoughtful of decisions and does not want to be impulsive. Shares ongoing healthy behavioral changes with ceasing cigarettes with 20 days with no cirgarette; deneis excessive drinking with no episodes of being drunk; eating at home more and has continues to loose weight. Shares for moods to have been stable with some anxiety about visiting new partners family and shares ways in which anxiety effects decision making at times. Shares engagement in self-care with quiet time and playing legos. Shares balanced thinking. Denies safety concerns. Ongoing work towards goals. Progress with goals.   Suicidal/Homicidal: Nowithout intent/plan  Therapist Response: Therapist engaged Kathryn [name] in [employer]. Confirmed current location and ability to hold confidential session. Therapist engaged Kathryn [name] in check in and assessed for current level of functioning sxs management. Provided safe place to share thoughts in regards to work and decision to quit. Explored next steps and ability to maintain financial security. Affirmed ability to make choice in balanced manner and ensure she was ok. Explored ability to be assertive with others and making a choice she felt was best for her and her morals and values. Supported in working through [employer] and weigh out out pros and cons and writing things down. Assessed for stability in moods and progress towards healthy changes. Encouraged clear effective communication and decision making with new partner. Reviewed session and provided follow up.  No safety concerns.   Plan: Return again in  x 3 weeks.  Diagnosis:  Bipolar 1 disorder, depressed (HCC)  Post-traumatic stress  GAD (generalized anxiety disorder)  Attention deficit hyperactivity disorder (ADHD), predominantly inattentive type  Collaboration of Care: Other None  Patient/Guardian was advised Release of Information must be obtained prior to any record release in order to collaborate their care with an outside provider. Patient/Guardian was advised if they have not already done so to contact the registration department to sign all necessary forms in order for [name] to release information regarding their care.   Consent: Patient/Guardian gives verbal consent for treatment and assignment of benefits for services provided during this visit. Patient/Guardian expressed understanding and agreed to proceed.   Stephan Minister [place], [facility] 12/14/2022

## 2023-01-04 ENCOUNTER — Ambulatory Visit (INDEPENDENT_AMBULATORY_CARE_PROVIDER_SITE_OTHER): Payer: Self-pay | Admitting: Mental Health

## 2023-01-04 DIAGNOSIS — F4310 Post-traumatic stress disorder, unspecified: Secondary | ICD-10-CM

## 2023-01-04 DIAGNOSIS — F319 Bipolar disorder, unspecified: Secondary | ICD-10-CM

## 2023-01-04 DIAGNOSIS — F411 Generalized anxiety disorder: Secondary | ICD-10-CM

## 2023-01-04 NOTE — Progress Notes (Unsigned)
THERAPIST PROGRESS NOTE Virtual Visit via Video Note  I connected with Kathryn [name] on 01/04/23 at  8:00 AM EST by a video enabled telemedicine application and verified that I am speaking with the correct person using two identifiers.  Location: Patient: home address on file Provider: home office   I discussed the limitations of evaluation and management by telemedicine and the availability of in person appointments. The patient expressed understanding and agreed to proceed.   I discussed the assessment and treatment plan with the patient. The patient was provided an opportunity to ask questions and all were answered. The patient agreed with the plan and demonstrated an understanding of the instructions.   The patient was advised to call back or seek an in-person evaluation if the symptoms worsen or if the condition fails to improve as anticipated.  I provided 53 minutes of non-face-to-face time during this encounter.   Dorris Singh, [facility]   Session Time: 8:05 am ( 53 minutes)  Participation Level: Active  Behavioral Response: CasualAlertEuthymic  Type of Therapy: Individual Therapy  Treatment Goals addressed:  STG: Kathryn [name] will increase overall wellness/ development of healthy habits AEB development of daily routine with improvement in physical health with reduced drinking and binge eating behaviors per self report within the next 90 days.     STG: Kathryn [name] will increase management of moods AEB development of effective coping skills with ability to explore and engage in enjoyable activities and self-care weekly withn the next 6 months   ProgressTowards Goals: Progressing  Interventions: CBT and Supportive  Summary: Kathryn [name] is a 27 y.o. female who presents with dx of bipolar d/o, PTSD, GAD and ADHD. Kathryn [name] presents alert and oriented; mood and affect stable; euthymic. Speech clear and coherent at normal rate and tone. Engaged and receptive to interventions. Shares  with therapist recent events of deciding to move in with her girlfriend who has just purchased a home. Notes to have discussed living there and feelings of security of living with a partner. 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 Notes thoughts of becoming triggered when partner stated feelings of disappointment. Shares to have immediately noticed internal reaction and was able to communicate and step away to calm self. Shares thoughts of childhood and feelings of disappointment with family for not doing as well and for being gay. Shares thoughts on ways in which she has disappointed self. Shares thoughts on relation to not being able to believe others comments of being proud of her as she believes disappointment. Explored thoughts of family and expectations of self and them. Shares growth with ability to manage anger and able to walk away in times in which she feels she can become explosive with anger and able to manage this well. Shares ongoing attendance to healthy habits with some decrease due to moving and upcoming changes. Notes for moods to be stable and use of skills and engagement in enjoyable activities and self-care. Denies SI/HI.   Suicidal/Homicidal: Nowithout intent/plan  Therapist Response: Therapist engaged Kathryn [name] in [employer]. Confirmed current location and ability to hold confidential session. Therapist engaged Kathryn [name] in check in and assessed for current level of functioning sxs management. Active empathic listening; providing support and encouragement; validated feelings. Supported Museum/gallery exhibitions officer in processing thoughts and feelings in balanced way and supported in concerns for upcoming presidency and what if means for her ability to marriage with lesbian status. Supported in working through challenging thoughts of disappointment and what expectations  parents had for her in childhood. Assessed for progress towards goals. Reviewed session and  provided follow up.   Plan: Return again in  x 4 weeks.  Diagnosis: Post-traumatic stress  Bipolar 1 disorder, depressed (HCC)  GAD (generalized anxiety disorder)  Collaboration of Care: Other None  Patient/Guardian was advised Release of Information must be obtained prior to any record release in order to collaborate their care with an outside provider. Patient/Guardian was advised if they have not already done so to contact the registration department to sign all necessary forms in order for [name] to release information regarding their care.   Consent: Patient/Guardian gives verbal consent for treatment and assignment of benefits for services provided during this visit. Patient/Guardian expressed understanding and agreed to proceed.   Stephan Minister [place], [facility] 01/04/2023

## 2023-01-18 ENCOUNTER — Ambulatory Visit (INDEPENDENT_AMBULATORY_CARE_PROVIDER_SITE_OTHER): Payer: No Payment, Other | Admitting: Mental Health

## 2023-01-18 DIAGNOSIS — F319 Bipolar disorder, unspecified: Secondary | ICD-10-CM | POA: Diagnosis not present

## 2023-01-18 DIAGNOSIS — F4310 Post-traumatic stress disorder, unspecified: Secondary | ICD-10-CM

## 2023-01-18 DIAGNOSIS — F900 Attention-deficit hyperactivity disorder, predominantly inattentive type: Secondary | ICD-10-CM | POA: Diagnosis not present

## 2023-01-18 NOTE — Progress Notes (Signed)
THERAPIST PROGRESS NOTE Virtual Visit via Video Note  I connected with Kathryn [name] on 01/18/23 at  8:00 AM EST by a video enabled telemedicine application and verified that I am speaking with the correct person using two identifiers.  Location: Patient: home address on file Provider: home office   I discussed the limitations of evaluation and management by telemedicine and the availability of in person appointments. The patient expressed understanding and agreed to proceed.  I discussed the assessment and treatment plan with the patient. The patient was provided an opportunity to ask questions and all were answered. The patient agreed with the plan and demonstrated an understanding of the instructions.   The patient was advised to call back or seek an in-person evaluation if the symptoms worsen or if the condition fails to improve as anticipated.  I provided 56 minutes of non-face-to-face time during this encounter.   Kathryn [name], [facility]   Session Time: 8:05 am ( 56 minutes)  Participation Level: Active  Behavioral Response: CasualAlertEuthymic  Type of Therapy: Individual Therapy  Treatment Goals addressed: STG: Kathryn [name] will increase overall wellness/ development of healthy habits AEB development of daily routine with improvement in physical health with reduced drinking and binge eating behaviors per self report within the next 90 days.     STG: Kathryn [name] will increase management of moods AEB development of effective coping skills with ability to explore and engage in enjoyable activities and self-care weekly withn the next 6 months   ProgressTowards Goals: Progressing  Interventions: Supportive  Summary: Kathryn [name] is a 27 y.o. female who presents with dx of bipolar d/o, PTSD, GAD and ADHD. Kathryn [name] presents alert and oriented; mood and affect stable; euthymic. Speech clear and coherent at normal rate and tone. Engaged and receptive to interventions. Shares with  therapist recent events of following through and moving in with girlfriend. Shares feelings of stress and overwhelming with getting things done but has been able to manage. Some difficulty falling asleep with adjustments of new home. Shares thoughts of interaction with family over the holidays and processes with therapist thoughts on expectations and roles in families. Shares relationship with step-father Kathryn November and him sharing to be proud of her. Shares with therapist relationship with girlfriend and frustrations with her child's father. Engages with therapist and able to process others thoughts and feelings in regards to care taking for child and adjustment for various parties in working through new situations. Agrees to work to communicate expectations with partner and ability to communicate thoughts and explore compromise in having her needs met with alone time with girlfriend. Denies elevated or depressed moods. Shares continues to engage in healthy habits and eating well, reduced drinking and lacks binge eating at this time. Denies SI/HI. Progress with goals.  Suicidal/Homicidal: Nowithout intent/plan  Therapist Response:   Therapist engaged Kathryn [name] in [employer]. Confirmed current location and ability to hold confidential session. Therapist engaged Kathryn [name] in check in and assessed for current level of functioning sxs management. Active empathic listening; providing support and encouragement. Validated feelings. Engaged Kathryn [name] in exploring family dynamics and presence of expectations of family members. Explored realistic expectations and lacking feeling love from family members vs. Logically knowing family loves her and ways in which the express love to other siblings. Discussed working to interact with others based of hx of interactions and adjusting and holding accurate expectations and ability to communicate needs/wants from family. Supported in navigating feelings of frustration in relationship  and explored others thoughts and experience  outside of her own. Encouraged patience and communication of her needs, wants expectations with ability to have difficult conversations. Reviewed session provided follow up.   Plan: Return again in x 3 weeks.  Diagnosis: Bipolar 1 disorder, depressed (HCC)  Post-traumatic stress  Attention deficit hyperactivity disorder (ADHD), predominantly inattentive type  Collaboration of Care: Other None  Patient/Guardian was advised Release of Information must be obtained prior to any record release in order to collaborate their care with an outside provider. Patient/Guardian was advised if they have not already done so to contact the registration department to sign all necessary forms in order for [name] to release information regarding their care.   Consent: Patient/Guardian gives verbal consent for treatment and assignment of benefits for services provided during this visit. Patient/Guardian expressed understanding and agreed to proceed.   Stephan Minister [place], [facility] 01/18/2023

## 2023-02-02 ENCOUNTER — Telehealth (HOSPITAL_COMMUNITY): Payer: Self-pay | Admitting: Student in an Organized Health Care Education/Training Program

## 2023-02-02 DIAGNOSIS — F900 Attention-deficit hyperactivity disorder, predominantly inattentive type: Secondary | ICD-10-CM

## 2023-02-02 DIAGNOSIS — F319 Bipolar disorder, unspecified: Secondary | ICD-10-CM

## 2023-02-02 DIAGNOSIS — F411 Generalized anxiety disorder: Secondary | ICD-10-CM

## 2023-02-02 MED ORDER — VENLAFAXINE HCL ER 75 MG PO CP24
ORAL_CAPSULE | ORAL | 0 refills | Status: DC
Start: 2023-02-02 — End: 2023-02-26

## 2023-02-02 MED ORDER — LAMOTRIGINE 200 MG PO TABS
200.0000 mg | ORAL_TABLET | Freq: Every day | ORAL | 0 refills | Status: DC
Start: 2023-02-02 — End: 2023-02-26

## 2023-02-02 MED ORDER — AMPHETAMINE-DEXTROAMPHETAMINE 12.5 MG PO TABS: 25.0000 mg | ORAL_TABLET | Freq: Two times a day (BID) | ORAL | 0 refills | Status: DC

## 2023-02-02 MED ORDER — RISPERIDONE 1 MG PO TABS
1.0000 mg | ORAL_TABLET | Freq: Every day | ORAL | 0 refills | Status: DC
Start: 2023-02-02 — End: 2023-02-26

## 2023-02-02 NOTE — Telephone Encounter (Signed)
Received message that patient needed bridge of her medications until her follow up appointment scheduled for 02/26/23.  Refills were sent in.  Per PDMP last fill of Adderall 11/29.    Sent- -Continue Effexor XR 75 mg daily for depression and anxiety.  30 tablets with 0 refills. -Continue Risperdal 1 mg QHS for mood stability.  30 tablets with 0 refills. -Continue Lamictal 200 mg QHS for mood stability.  30 tablets with 0 refills. -Continue Adderall 20 mg BID.  120 (12.5 mg) tablets with 0 refills.    Arna Snipe MD Resident

## 2023-02-15 ENCOUNTER — Ambulatory Visit (HOSPITAL_COMMUNITY): Payer: No Payment, Other | Admitting: Mental Health

## 2023-02-15 ENCOUNTER — Encounter (HOSPITAL_COMMUNITY): Payer: Self-pay

## 2023-02-15 DIAGNOSIS — F411 Generalized anxiety disorder: Secondary | ICD-10-CM | POA: Diagnosis not present

## 2023-02-15 DIAGNOSIS — F900 Attention-deficit hyperactivity disorder, predominantly inattentive type: Secondary | ICD-10-CM

## 2023-02-15 DIAGNOSIS — F319 Bipolar disorder, unspecified: Secondary | ICD-10-CM

## 2023-02-15 NOTE — Progress Notes (Signed)
THERAPIST PROGRESS NOTE Virtual Visit via Video Note  I connected with Kathryn [name] on 02/15/23 at  8:00 AM EST by a video enabled telemedicine application and verified that I am speaking with the correct person using two identifiers.  Location: Patient[place] Provider: home office    I discussed the limitations of evaluation and management by telemedicine and the availability of in person appointments. The patient expressed understanding and agreed to proceed.  I discussed the assessment and treatment plan with the patient. The patient was provided an opportunity to ask questions and all were answered. The patient agreed with the plan and demonstrated an understanding of the instructions.   The patient was advised to call back or seek an in-person evaluation if the symptoms worsen or if the condition fails to improve as anticipated.  I provided 39 minutes of non-face-to-face time during this encounter.   Dorris Singh, [facility]   Session Time: 8:04 am ( 39 minutes)  Participation Level: Active  Behavioral Response: CasualAlertDysphoric  Type of Therapy: Individual Therapy  Treatment Goals addressed:  STG: Kathryn [name] will increase overall wellness/ development of healthy habits AEB development of daily routine with improvement in physical health with reduced drinking and binge eating behaviors per self report within the next 90 days.    STG: Kathryn [name] will increase management of moods AEB development of effective coping skills with ability to explore and engage in enjoyable activities and self-care weekly withn the next 6 months   ProgressTowards Goals: Progressing  Interventions: CBT and Supportive  Summary:  Kathryn [name] is a 27 y.o. female who presents with dx of bipolar d/o, PTSD, GAD and ADHD. Kathryn [name] presents alert and oriented; mood and affect stable but low; dysphoric. Speech clear and coherent at normal rate and tone. Engaged and receptive to interventions.  Notes recent increase in feelings of depression sharing increase in sleeping with ongoing lethargy; negative thinking, increased irritability. "Useless". 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 Fluctuating eating habits. Reports for partner to have shared that she is being a help to her but notes difficulty with adjusting, " " I have a hard time with change." Notes with now parenting role can feel as if partner's daughter can be annoying and working to adjust to new family role. Explores with therapist ability to work through adjustment and ability to maintain sense of stability for self. Shares increase in negative thinking. Notes need to refocus on healthy habits goal and working to engage in exercise, eating and self-care for self. Denies excessive drinking behaviors. Agrees to monitor thought process and reframe as needed as means of coping and working through feelings of depression. Shares to be medication compliant. Reviewed treatment plan and shares would like to continue to goals. Denies safety concerns.  PHQ: 15 GAD: 8  Suicidal/Homicidal: Nowithout intent/plan  Therapist Response: Therapist engaged Kathryn [name] in [employer]. Confirmed current location and ability to hold confidential session. Therapist engaged Kathryn [name] in check in and assessed for current level of functioning sxs management. Active empathic listening; providing support and encouragement. Explored feelings of increased depression and explored factors and triggers for depression. Provided support in processing feelings of depression and being overwhelmed. Assessed current presence of distorted thinking and reviewed ability to reframe thoughts and awareness of presence of negative thinking. Encouraged working to re-establish routine and monitoring thoughts. Assessed PHQ and GAD scores and reviewed treatment plan.   Plan: Return again in x 2 weeks.  Diagnosis: Bipolar 1 disorder, depressed  (HCC)  GAD (generalized anxiety disorder)  Attention deficit hyperactivity disorder (ADHD), predominantly inattentive type  Collaboration of Care: Other None  Patient/Guardian was advised Release of Information must be obtained prior to any record release in order to collaborate their care with an outside provider. Patient/Guardian was advised if they have not already done so to contact the registration department to sign all necessary forms in order for [name] to release information regarding their care.   Consent: Patient/Guardian gives verbal consent for treatment and assignment of benefits for services provided during this visit. Patient/Guardian expressed understanding and agreed to proceed.   Stephan Minister [place], [facility] 02/15/2023

## 2023-02-26 ENCOUNTER — Encounter (HOSPITAL_COMMUNITY): Payer: Self-pay

## 2023-02-26 ENCOUNTER — Telehealth (HOSPITAL_COMMUNITY): Payer: Self-pay | Admitting: Student in an Organized Health Care Education/Training Program

## 2023-02-26 ENCOUNTER — Encounter (HOSPITAL_COMMUNITY): Payer: No Payment, Other | Admitting: Student in an Organized Health Care Education/Training Program

## 2023-02-26 DIAGNOSIS — F900 Attention-deficit hyperactivity disorder, predominantly inattentive type: Secondary | ICD-10-CM

## 2023-02-26 DIAGNOSIS — F411 Generalized anxiety disorder: Secondary | ICD-10-CM

## 2023-02-26 DIAGNOSIS — F319 Bipolar disorder, unspecified: Secondary | ICD-10-CM

## 2023-02-26 MED ORDER — RISPERIDONE 1 MG PO TABS: 1.0000 mg | ORAL_TABLET | Freq: Every day | ORAL | 0 refills | Status: DC

## 2023-02-26 MED ORDER — AMPHETAMINE-DEXTROAMPHETAMINE 12.5 MG PO TABS: 25.0000 mg | ORAL_TABLET | Freq: Two times a day (BID) | ORAL | 0 refills | Status: DC

## 2023-02-26 MED ORDER — VENLAFAXINE HCL ER 75 MG PO CP24: ORAL_CAPSULE | ORAL | 0 refills | Status: DC

## 2023-02-26 MED ORDER — LAMOTRIGINE 200 MG PO TABS: 200.0000 mg | ORAL_TABLET | Freq: Every day | ORAL | 0 refills | Status: DC

## 2023-02-26 NOTE — Telephone Encounter (Signed)
 Received message that patient had rescheduled appointment for 1/31 but would need a bridge of her medications until that appointment.  These were sent in.    Sent: -Continue Effexor  XR 75 mg daily for depression and anxiety.  30 tablets with 0 refills. -Continue Risperdal  1 mg QHS for mood stability.  30 tablets with 0 refills. -Continue Lamictal  200 mg QHS for mood stability.  30 tablets with 0 refills. -Continue Adderall 20 mg BID.  120 (12.5 mg) tablets with 0 refills    Marolyn Rosser MD Resident

## 2023-02-26 NOTE — Progress Notes (Signed)
 This encounter was created in error - please disregard.

## 2023-02-26 NOTE — Progress Notes (Deleted)
 BH MD/PA/NP OP Progress Note  Virtual Visit via Video Note  I connected with Kathryn [name] on 02/26/23 at  9:00 AM EST by a video enabled telemedicine application and verified that I am speaking with the correct person using two identifiers.  Location: Patient: Work  Provider: [facility]   I discussed the limitations of evaluation and management by telemedicine and the availability of in person appointments. The patient expressed understanding and agreed to proceed.    02/26/2023 7:11 AM Kathryn [name]  MRN:  [id number]  Chief Complaint:  No chief complaint on file.  HPI:  Kathryn [name] is a 28 yr old female who presents for Follow Up and Medication Management. PPHx is significant for Bipolar Disorder, GAD, PTSD, and ADHD.  She reports ***    Visit Diagnosis:  No diagnosis found.     Past Psychiatric History: Bipolar Disorder, GAD, PTSD, and ADHD.   Past Medical History:  Past Medical History:  Diagnosis Date   Abnormal liver function tests 02/02/2016   ADHD 06/10/2016   Alcohol use 02/23/2016   Allergy    Anxiety    Arm fracture, right    as child, fx in three places, no surgery   Depression    Depression with anxiety April 30, 2014   PTSD s/p death of best friend    Essential hypertension 02/02/2016   GERD (gastroesophageal reflux disease)    H/O cold sores 04/15/2015   Headache 04/09/2016   Hyperlipidemia, mixed 02/02/2016   Migraine    Ovarian cyst    PCOS (polycystic ovarian syndrome)    Preventative health care 10/27/2015   Thrombocytosis 04/09/2016   Vaginal Pap smear, abnormal    HGSIL    Past Surgical History:  Procedure Laterality Date   ADENOIDECTOMY  2002   TYMPANOSTOMY TUBE PLACEMENT Bilateral 1999    Family Psychiatric History: Maternal Aunt- Anxiety Maternal Grandfather- Anxiety Mother- Anxiety and OCD  Family History:  Family History  Problem Relation Age of Onset   Hypertension Mother    Heart disease Mother        CAD, stent at 82, s/p cardiac  arrest with Defib   Hyperlipidemia Mother    Diabetes Mother        s/p gastric bypass   Obesity Mother        s/p gastric bypass   OCD Mother    Hyperlipidemia Father    Hypertension Father    Anxiety disorder Father    Asthma Sister    Hyperlipidemia Brother    Anxiety disorder Brother    Stroke Maternal Grandmother    Thyroid  disease Maternal Grandmother    Anxiety disorder Maternal Grandmother    Heart disease Maternal Grandfather        MI first at 78, s/p stents and CABG   Hyperlipidemia Maternal Grandfather    Hypertension Maternal Grandfather    Stroke Maternal Grandfather    Cancer Maternal Grandfather        melanoma   Aneurysm Maternal Grandfather        brain   Kidney disease Maternal Grandfather        tumor   Heart disease Paternal Grandmother    Cancer Paternal Grandfather        ung and brain   Heart disease Maternal Uncle    Vision loss Maternal Uncle    Anxiety disorder Maternal Uncle    Anxiety disorder Maternal Aunt    Depression Maternal Aunt     Social History:  Social History   Socioeconomic  History   Marital status: Single    Spouse name: Not on file   Number of children: 0   Years of education: 12   Highest education level: Not on file  Occupational History    Comment: [employer]  Tobacco Use   Smoking status: Heavy Smoker    Current packs/day: 1.50    Types: Cigarettes   Smokeless tobacco: Former    Types: Snuff  Vaping Use   Vaping status: Never Used  Substance and Sexual Activity   Alcohol use: Yes    Comment: maybe every 6 months   Drug use: Yes    Types: Marijuana    Comment: daily   Sexual activity: Yes    Partners: Male    Birth control/protection: Pill  Other Topics Concern   Not on file  Social History Narrative   Not on file   Social Drivers of Health   Financial Resource Strain: Medium Risk (01/16/2022)   Overall Financial Resource Strain (CARDIA)    Difficulty of Paying Living Expenses: Somewhat hard   Food Insecurity: No Food Insecurity (01/16/2022)   Hunger Vital Sign    Worried About Running Out of Food in the Last Year: Never true    Ran Out of Food in the Last Year: Never true  Transportation Needs: No Transportation Needs (01/16/2022)   PRAPARE - Administrator, Civil Service (Medical): No    Lack of Transportation (Non-Medical): No  Physical Activity: Inactive (01/16/2022)   Exercise Vital Sign    Days of Exercise per Week: 0 days    Minutes of Exercise per Session: 0 min  Stress: No Stress Concern Present (01/16/2022)   [employer] of Occupational Health - Occupational Stress Questionnaire    Feeling of Stress : Only a little  Social Connections: Socially Isolated (01/16/2022)   Social Connection and Isolation Panel [NHANES]    Frequency of Communication with Friends and Family: More than three times a week    Frequency of Social Gatherings with Friends and Family: Never    Attends Religious Services: Never    Database Administrator or Organizations: No    Attends Banker Meetings: Never    Marital Status: Never married    Allergies:  Allergies  Allergen Reactions   Oxycodone  Nausea Only, Nausea And Vomiting and Other (See Comments)    Oxycontin  also Oxycontin  also Oxycontin  also Oxycontin  also    Quetiapine  Fumarate Other (See Comments)    Mental status changes Mental status changes Mental status changes Mental status changes     Metabolic Disorder Labs: Lab Results  Component Value Date   HGBA1C 5.2 11/17/2018   No results found for: PROLACTIN Lab Results  Component Value Date   CHOL 222 (H) 06/18/2021   TRIG 72.0 06/18/2021   HDL 56.90 06/18/2021   CHOLHDL 4 06/18/2021   VLDL 14.4 06/18/2021   LDLCALC 150 (H) 06/18/2021   LDLCALC 166 (H) 11/17/2018   Lab Results  Component Value Date   TSH 0.735 11/17/2018   TSH 2.49 05/05/2017    Therapeutic Level Labs: No results found for: LITHIUM No results found for:  VALPROATE No results found for: CBMZ  Current Medications: Current Outpatient Medications  Medication Sig Dispense Refill   amphetamine -dextroamphetamine  (ADDERALL) 12.5 MG tablet Take 2 tablets by mouth 2 (two) times daily. 120 tablet 0   amphetamine -dextroamphetamine  (ADDERALL) 12.5 MG tablet Take 2 tablets by mouth 2 (two) times daily. 120 tablet 0   amphetamine -dextroamphetamine  (ADDERALL) 12.5  MG tablet Take 2 tablets by mouth 2 (two) times daily. 120 tablet 0   ferrous sulfate 325 (65 FE) MG tablet Take 325 mg by mouth daily with breakfast.     lamoTRIgine  (LAMICTAL ) 200 MG tablet Take 1 tablet (200 mg total) by mouth at bedtime. Take one a day 30 tablet 0   Levonorgestrel-Ethinyl Estradiol (AMETHIA) 0.15-0.03 &0.01 MG tablet Take 1 tablet by mouth daily. 91 tablet 4   omeprazole  (PRILOSEC) 40 MG capsule Take 1 capsule (40 mg total) by mouth 2 (two) times daily. 180 capsule 3   risperiDONE  (RISPERDAL ) 1 MG tablet Take 1 tablet (1 mg total) by mouth at bedtime. 30 tablet 0   valACYclovir  (VALTREX ) 1000 MG tablet Take 1 tablet (1,000 mg total) by mouth daily. 90 tablet 4   venlafaxine  XR (EFFEXOR -XR) 75 MG 24 hr capsule TAKE ONE CAPSULE BY MOUTH DAILY  WITH BREAKFAST 30 capsule 0   vitamin B-12 (CYANOCOBALAMIN) 500 MCG tablet Take 500 mcg by mouth daily.     No current facility-administered medications for this visit.     Musculoskeletal: Strength & Muscle Tone: within normal limits Gait & Station:  Sitting During Interview Patient leans: N/A *** Psychiatric Specialty Exam: Review of Systems  There were no vitals taken for this visit.There is no height or weight on file to calculate BMI.  General Appearance: Casual and Fairly Groomed  Eye Contact:  Good  Speech:  Clear and Coherent and Normal Rate  Volume:  Normal  Mood:   ok  Affect:  Appropriate and Congruent  Thought Process:  Coherent and Goal Directed  Orientation:  Full (Time, Place, and Person)  Thought Content:  WDL and Logical   Suicidal Thoughts:  No  Homicidal Thoughts:  No  Memory:  Immediate;   Good Recent;   Good  Judgement:  Good  Insight:  Good  Psychomotor Activity:  Normal  Concentration:  Concentration: Good and Attention Span: Good  Recall:  Good  Fund of Knowledge: Good  Language: Good  Akathisia:  Negative  Handed:  Right  AIMS (if indicated): not done  Assets:  Communication Skills Desire for Improvement Housing Physical Health Resilience  ADL's:  Intact  Cognition: WNL  Sleep:  Good   Screenings: AUDIT    Advertising Copywriter from 01/16/2022 in [facility]  Alcohol Use Disorder Identification Test Final Score (AUDIT) 8      GAD-7    Flowsheet Row Counselor from 02/15/2023 in [facility] Office Visit from 04/24/2022 in [facility] [employer] Counselor from 01/16/2022 in [facility] Video Visit from 05/27/2021 in [facility] Video Visit from 03/13/2021 in [facility]  Total GAD-7 Score 8 0 7 3 1       PHQ2-9    Flowsheet Row Counselor from 02/15/2023 in [facility] Office Visit from 04/24/2022 in [facility] Counselor from 01/16/2022 in [facility] Video Visit from 05/27/2021 in [facility] Video Visit from 03/13/2021 in [facility]  PHQ-2 Total Score 4 0 5 3 2   PHQ-9 Total Score 15 2 14 6 7       Flowsheet Row Counselor from 01/16/2022 in [facility] Video Visit from 05/27/2021 in [facility]  C-SSRS RISK CATEGORY Low Risk No Risk  Assessment and Plan:  Kathryn [name] is a 28 yr old female who presents for Follow Up and Medication Management. PPHx is significant for Bipolar  Disorder, GAD, PTSD, and ADHD.   Kathryn [name] ***   Bipolar 1 disorder, depressed  GAD -Continue Effexor  XR 75 mg daily for depression and anxiety.  30 tablets with 2 refills. -Continue Risperdal  1 mg QHS for mood stability.  30 tablets with 2 refills. -Continue Lamictal  200 mg QHS for mood stability.  30 tablets with 2 refills.     ADHD -Continue Adderall 20 mg BID.  120 (12.5 mg) tablets with 2 refills.  Last fill 9/28 so First fill 10/25    Collaboration of Care: Collaboration of Care: Other provider involved in patient's care AEB [facility] Therapist  Patient/Guardian was advised Release of Information must be obtained prior to any record release in order to collaborate their care with an outside provider. Patient/Guardian was advised if they have not already done so to contact the registration department to sign all necessary forms in order for us  to release information regarding their care.   Consent: Patient/Guardian gives verbal consent for treatment and assignment of benefits for services provided during this visit. Patient/Guardian expressed understanding and agreed to proceed.    Kathryn GORMAN Rosser, MD 02/26/2023, 7:11 AM   Follow Up Instructions:    I discussed the assessment and treatment plan with the patient. The patient was provided an opportunity to ask questions and all were answered. The patient agreed with the plan and demonstrated an understanding of the instructions.   The patient was advised to call back or seek an in-person evaluation if the symptoms worsen or if the condition fails to improve as anticipated.  I provided 6 minutes of non-face-to-face time during this encounter.   Kathryn GORMAN Rosser, MD

## 2023-03-12 ENCOUNTER — Ambulatory Visit (INDEPENDENT_AMBULATORY_CARE_PROVIDER_SITE_OTHER): Payer: No Payment, Other | Admitting: Mental Health

## 2023-03-12 DIAGNOSIS — F411 Generalized anxiety disorder: Secondary | ICD-10-CM | POA: Diagnosis not present

## 2023-03-12 DIAGNOSIS — F3130 Bipolar disorder, current episode depressed, mild or moderate severity, unspecified: Secondary | ICD-10-CM

## 2023-03-12 DIAGNOSIS — F4310 Post-traumatic stress disorder, unspecified: Secondary | ICD-10-CM

## 2023-03-12 DIAGNOSIS — F319 Bipolar disorder, unspecified: Secondary | ICD-10-CM

## 2023-03-12 NOTE — Progress Notes (Unsigned)
THERAPIST PROGRESS NOTE Virtual Visit via Video Note  I connected with Kathryn [name] on 03/12/23 at  8:00 AM EST by a video enabled telemedicine application and verified that I am speaking with the correct person using two identifiers.  Location: Patient: home address on file Provider: home office   I discussed the limitations of evaluation and management by telemedicine and the availability of in person appointments. The patient expressed understanding and agreed to proceed.  I discussed the assessment and treatment plan with the patient. The patient was provided an opportunity to ask questions and all were answered. The patient agreed with the plan and demonstrated an understanding of the instructions.   The patient was advised to call back or seek an in-person evaluation if the symptoms worsen or if the condition fails to improve as anticipated.  I provided 43 minutes of non-face-to-face time during this encounter.   Kathryn [name], [facility]   Session Time: 8:04 am (   Participation Level: Active  Behavioral Response: CasualAlertEuthymic  Type of Therapy: Individual Therapy  Treatment Goals addressed: STG: Kathryn [name] will increase overall wellness/ development of healthy habits AEB development of daily routine with improvement in physical health with reduced drinking and binge eating behaviors per self report within the next 90 days.     STG: Kathryn [name] will increase management of moods AEB development of effective coping skills with ability to explore and engage in enjoyable activities and self-care weekly withn the next 6 months   ProgressTowards Goals: Progressing  Interventions: Supportive  Summary:  Kathryn [name] is a 28 y.o. female who presents with dx of bipolar d/o, PTSD, GAD and ADHD. Aide presents alert and oriented; mood and affect stable.  Speech clear and coherent at normal rate and tone. Engaged and receptive to interventions. Shares for moods to have been steady  and "stable" and denies concerns for extreme highs or lows. Notes poor sleep at times. Shares eating better; responsible drinking " I can't think of the last time I was drunk" and shares attending to other routine and wellness goals with presenting to the gym regularly. Shares events of argument with girlfriend and shares with therapist factors that contributed to disagreement and ability to work through disagreement. Notes recentl increase worry about her dog noting for him to be an older dog. Able to process thoughts an feelings in balanced way. Progress with goals; sxs stable.   Suicidal/Homicidal: Nowithout intent/plan  Therapist Response: Therapist engaged Kathryn [name] in [employer]. Confirmed current location and ability to hold confidential session. Therapist engaged Kathryn [name] in check in and assessed for current level of functioning sxs management. Active empathic listening proviied support and encouragement. Supported Museum/gallery exhibitions officer in processing recent argument with girlfriend and explore factors contributing and effective communication skills with partner. Explored ability to attend to self-care and routine of maintaining progress with wellness goals. Explored thoughts of pet dog and explored feelings of support and love from others.  Reviewed session and provided follow up.   Plan: Return again in  x 4 weeks.  Diagnosis: Bipolar 1 disorder, depressed (HCC)  GAD (generalized anxiety disorder)  Post-traumatic stress  Collaboration of Care: Other None  Patient/Guardian was advised Release of Information must be obtained prior to any record release in order to collaborate their care with an outside provider. Patient/Guardian was advised if they have not already done so to contact the registration department to sign all necessary forms in order for [name] to release information regarding their care.   Consent: Patient/Guardian gives  verbal consent for treatment and assignment of benefits for services  provided during this visit. Patient/Guardian expressed understanding and agreed to proceed.   Kathryn [name] [place] 03/12/2023

## 2023-03-17 ENCOUNTER — Telehealth (HOSPITAL_COMMUNITY): Payer: Self-pay | Admitting: Mental Health

## 2023-03-19 ENCOUNTER — Telehealth (INDEPENDENT_AMBULATORY_CARE_PROVIDER_SITE_OTHER): Payer: No Payment, Other | Admitting: Student in an Organized Health Care Education/Training Program

## 2023-03-19 ENCOUNTER — Encounter (HOSPITAL_COMMUNITY): Payer: Self-pay | Admitting: Student in an Organized Health Care Education/Training Program

## 2023-03-19 DIAGNOSIS — F900 Attention-deficit hyperactivity disorder, predominantly inattentive type: Secondary | ICD-10-CM

## 2023-03-19 DIAGNOSIS — F411 Generalized anxiety disorder: Secondary | ICD-10-CM | POA: Diagnosis not present

## 2023-03-19 DIAGNOSIS — F319 Bipolar disorder, unspecified: Secondary | ICD-10-CM

## 2023-03-19 MED ORDER — AMPHETAMINE-DEXTROAMPHETAMINE 12.5 MG PO TABS: 25.0000 mg | ORAL_TABLET | Freq: Two times a day (BID) | ORAL | 0 refills | Status: DC

## 2023-03-19 MED ORDER — VENLAFAXINE HCL ER 75 MG PO CP24: ORAL_CAPSULE | ORAL | 2 refills | Status: DC

## 2023-03-19 MED ORDER — RISPERIDONE 1 MG PO TABS: 1.0000 mg | ORAL_TABLET | Freq: Every day | ORAL | 2 refills | Status: DC

## 2023-03-19 MED ORDER — LAMOTRIGINE 200 MG PO TABS: 200.0000 mg | ORAL_TABLET | Freq: Every day | ORAL | 2 refills | Status: DC

## 2023-03-19 MED ORDER — ONDANSETRON HCL 4 MG PO TABS: 4.0000 mg | ORAL_TABLET | Freq: Three times a day (TID) | ORAL | 0 refills | Status: DC | PRN

## 2023-03-19 NOTE — Progress Notes (Signed)
BH MD/PA/NP OP Progress Note  Virtual Visit via Video Note  I connected with Kathryn [name] on 03/19/23 at  8:30 AM EST by a video enabled telemedicine application and verified that I am speaking with the correct person using two identifiers.  Location: Patient: Work/Car Provider: [facility]   I discussed the limitations of evaluation and management by telemedicine and the availability of in person appointments. The patient expressed understanding and agreed to proceed.    03/19/2023 9:04 AM Kathryn [name]  MRN:  [id number]  Chief Complaint:  Chief Complaint  Patient presents with   Bipolar Depressed   Anxiety   Follow-up   HPI:  Kathryn [name] is a 28 yr old female who presents for Follow Up and Medication Management. PPHx is significant for Bipolar Disorder, GAD, PTSD, and ADHD.  She reports she had been good until last week.  She reports she is currently going through a break-up and this has been tough on her.  She reports that since then she has had trouble with sleep.  She reports having trouble with her mind continuing to think about things when trying to go to sleep and also waking up during the night.  She reports that through this her mood has remained stable.  She reports due to the increased anxiety she has had some nausea and vomiting.  Discussed with her that her response to the situation did not seem proportional and appropriate.  Discussed that due to this we would not make any changes to her medications at this time but did encourage her to when able to reduce her Adderall for the next 1 to 2 weeks and when not working not take it at all.  She reported understanding was agreeable with this.  Discussed with her that we could send in a small prescription of Zofran to address her nausea and vomiting when her anxiety becomes overwhelming.  She reports no SI, HI, or AVH.  She reports her sleep was good into the last week but has been poor since.  She reports her appetite has been fair.  She  reports no other concerns at present.  She return for follow-up in approximately 2 to 3 months.    Visit Diagnosis:    ICD-10-CM   1. Bipolar 1 disorder, depressed (HCC)  F31.9 risperiDONE (RISPERDAL) 1 MG tablet    lamoTRIgine (LAMICTAL) 200 MG tablet    venlafaxine XR (EFFEXOR-XR) 75 MG 24 hr capsule    2. Attention deficit hyperactivity disorder (ADHD), predominantly inattentive type  F90.0 amphetamine-dextroamphetamine (ADDERALL) 12.5 MG tablet    amphetamine-dextroamphetamine (ADDERALL) 12.5 MG tablet    amphetamine-dextroamphetamine (ADDERALL) 12.5 MG tablet    3. Anxiety state  F41.1 ondansetron (ZOFRAN) 4 MG tablet    4. GAD (generalized anxiety disorder)  F41.1 venlafaxine XR (EFFEXOR-XR) 75 MG 24 hr capsule         Past Psychiatric History: Bipolar Disorder, GAD, PTSD, and ADHD.   Past Medical History:  Past Medical History:  Diagnosis Date   Abnormal liver function tests 02/02/2016   ADHD 06/10/2016   Alcohol use 02/23/2016   Allergy    Anxiety    Arm fracture, right    as child, fx in three places, no surgery   Depression    Depression with anxiety 2014-05-11   PTSD s/p death of best friend    Essential hypertension 02/02/2016   GERD (gastroesophageal reflux disease)    H/O cold sores 04/15/2015   Headache 04/09/2016   Hyperlipidemia, mixed 02/02/2016  Migraine    Ovarian cyst    PCOS (polycystic ovarian syndrome)    Preventative health care 10/27/2015   Thrombocytosis 04/09/2016   Vaginal Pap smear, abnormal    HGSIL    Past Surgical History:  Procedure Laterality Date   ADENOIDECTOMY  2002   TYMPANOSTOMY TUBE PLACEMENT Bilateral 1999    Family Psychiatric History: Maternal Aunt- Anxiety Maternal Grandfather- Anxiety Mother- Anxiety and OCD  Family History:  Family History  Problem Relation Age of Onset   Hypertension Mother    Heart disease Mother        CAD, stent at 73, s/p cardiac arrest with Defib   Hyperlipidemia Mother    Diabetes  Mother        s/p gastric bypass   Obesity Mother        s/p gastric bypass   OCD Mother    Hyperlipidemia Father    Hypertension Father    Anxiety disorder Father    Asthma Sister    Hyperlipidemia Brother    Anxiety disorder Brother    Stroke Maternal Grandmother    Thyroid disease Maternal Grandmother    Anxiety disorder Maternal Grandmother    Heart disease Maternal Grandfather        MI first at 29, s/p stents and CABG   Hyperlipidemia Maternal Grandfather    Hypertension Maternal Grandfather    Stroke Maternal Grandfather    Cancer Maternal Grandfather        melanoma   Aneurysm Maternal Grandfather        brain   Kidney disease Maternal Grandfather        tumor   Heart disease Paternal Grandmother    Cancer Paternal Grandfather        ung and brain   Heart disease Maternal Uncle    Vision loss Maternal Uncle    Anxiety disorder Maternal Uncle    Anxiety disorder Maternal Aunt    Depression Maternal Aunt     Social History:  Social History   Socioeconomic History   Marital status: Single    Spouse name: Not on file   Number of children: 0   Years of education: 12   Highest education level: Not on file  Occupational History    Comment: [employer]  Tobacco Use   Smoking status: Heavy Smoker    Current packs/day: 1.50    Types: Cigarettes   Smokeless tobacco: Former    Types: Snuff  Vaping Use   Vaping status: Never Used  Substance and Sexual Activity   Alcohol use: Yes    Comment: maybe every 6 months   Drug use: Yes    Types: Marijuana    Comment: daily   Sexual activity: Yes    Partners: Male    Birth control/protection: Pill  Other Topics Concern   Not on file  Social History Narrative   Not on file   Social Drivers of Health   Financial Resource Strain: Medium Risk (01/16/2022)   Overall Financial Resource Strain (CARDIA)    Difficulty of Paying Living Expenses: Somewhat hard  Food Insecurity: No Food Insecurity (01/16/2022)   Hunger  Vital Sign    Worried About Running Out of Food in the Last Year: Never true    Ran Out of Food in the Last Year: Never true  Transportation Needs: No Transportation Needs (01/16/2022)   PRAPARE - Administrator, Civil Service (Medical): No    Lack of Transportation (Non-Medical): No  Physical Activity: Inactive (  01/16/2022)   Exercise Vital Sign    Days of Exercise per Week: 0 days    Minutes of Exercise per Session: 0 min  Stress: No Stress Concern Present (01/16/2022)   [employer] of Occupational Health - Occupational Stress Questionnaire    Feeling of Stress : Only a little  Social Connections: Socially Isolated (01/16/2022)   Social Connection and Isolation Panel [NHANES]    Frequency of Communication with Friends and Family: More than three times a week    Frequency of Social Gatherings with Friends and Family: Never    Attends Religious Services: Never    Database administrator or Organizations: No    Attends Banker Meetings: Never    Marital Status: Never married    Allergies:  Allergies  Allergen Reactions   Oxycodone Nausea Only, Nausea And Vomiting and Other (See Comments)    Oxycontin also Oxycontin also Oxycontin also Oxycontin also    Quetiapine Fumarate Other (See Comments)    Mental status changes Mental status changes Mental status changes Mental status changes     Metabolic Disorder Labs: Lab Results  Component Value Date   HGBA1C 5.2 11/17/2018   No results found for: "PROLACTIN" Lab Results  Component Value Date   CHOL 222 (H) 06/18/2021   TRIG 72.0 06/18/2021   HDL 56.90 06/18/2021   CHOLHDL 4 06/18/2021   VLDL 14.4 06/18/2021   LDLCALC 150 (H) 06/18/2021   LDLCALC 166 (H) 11/17/2018   Lab Results  Component Value Date   TSH 0.735 11/17/2018   TSH 2.49 05/05/2017    Therapeutic Level Labs: No results found for: "LITHIUM" No results found for: "VALPROATE" No results found for: "CBMZ"  Current  Medications: Current Outpatient Medications  Medication Sig Dispense Refill   ondansetron (ZOFRAN) 4 MG tablet Take 1 tablet (4 mg total) by mouth every 8 (eight) hours as needed for nausea or vomiting. 10 tablet 0   [START ON 04/13/2023] amphetamine-dextroamphetamine (ADDERALL) 12.5 MG tablet Take 2 tablets by mouth 2 (two) times daily. 120 tablet 0   [START ON 05/11/2023] amphetamine-dextroamphetamine (ADDERALL) 12.5 MG tablet Take 2 tablets by mouth 2 (two) times daily. 120 tablet 0   [START ON 06/08/2023] amphetamine-dextroamphetamine (ADDERALL) 12.5 MG tablet Take 2 tablets by mouth 2 (two) times daily. 120 tablet 0   ferrous sulfate 325 (65 FE) MG tablet Take 325 mg by mouth daily with breakfast.     lamoTRIgine (LAMICTAL) 200 MG tablet Take 1 tablet (200 mg total) by mouth at bedtime. Take one a day 30 tablet 2   Levonorgestrel-Ethinyl Estradiol (AMETHIA) 0.15-0.03 &0.01 MG tablet Take 1 tablet by mouth daily. 91 tablet 4   omeprazole (PRILOSEC) 40 MG capsule Take 1 capsule (40 mg total) by mouth 2 (two) times daily. 180 capsule 3   risperiDONE (RISPERDAL) 1 MG tablet Take 1 tablet (1 mg total) by mouth at bedtime. 30 tablet 2   valACYclovir (VALTREX) 1000 MG tablet Take 1 tablet (1,000 mg total) by mouth daily. 90 tablet 4   venlafaxine XR (EFFEXOR-XR) 75 MG 24 hr capsule TAKE ONE CAPSULE BY MOUTH DAILY  WITH BREAKFAST 30 capsule 2   vitamin B-12 (CYANOCOBALAMIN) 500 MCG tablet Take 500 mcg by mouth daily.     No current facility-administered medications for this visit.     Musculoskeletal: Strength & Muscle Tone: within normal limits Gait & Station:  Sitting During Interview Patient leans: N/A  Psychiatric Specialty Exam: Review of Systems  Respiratory:  Negative  for shortness of breath.   Cardiovascular:  Negative for chest pain.  Gastrointestinal:  Positive for nausea and vomiting. Negative for abdominal pain, constipation and diarrhea.  Neurological:  Negative for dizziness,  weakness and headaches.  Psychiatric/Behavioral:  Positive for dysphoric mood and sleep disturbance. Negative for hallucinations and suicidal ideas. The patient is nervous/anxious.     There were no vitals taken for this visit.There is no height or weight on file to calculate BMI.  General Appearance: Casual and Fairly Groomed  Eye Contact:  Good  Speech:  Clear and Coherent and Normal Rate  Volume:  Normal  Mood:   "a little sad/anxious but overall ok"  Affect:  Appropriate and Congruent  Thought Process:  Coherent and Goal Directed  Orientation:  Full (Time, Place, and Person)  Thought Content: WDL and Logical   Suicidal Thoughts:  No  Homicidal Thoughts:  No  Memory:  Immediate;   Good Recent;   Good  Judgement:  Good  Insight:  Good  Psychomotor Activity:  Normal  Concentration:  Concentration: Good and Attention Span: Good  Recall:  Good  Fund of Knowledge: Good  Language: Good  Akathisia:  Negative  Handed:  Right  AIMS (if indicated): not done  Assets:  Communication Skills Desire for Improvement Housing Physical Health Resilience  ADL's:  Intact  Cognition: WNL  Sleep:  Poor (in last week)   Screenings: AUDIT    Advertising copywriter from 01/16/2022 in [facility]  Alcohol Use Disorder Identification Test Final Score (AUDIT) 8      GAD-7    Flowsheet Row Counselor from 02/15/2023 in [facility] Office Visit from 04/24/2022 in [facility] [employer] Counselor from 01/16/2022 in [facility] Video Visit from 05/27/2021 in [facility] Video Visit from 03/13/2021 in [facility]  Total GAD-7 Score 8 0 7 3 1       PHQ2-9    Flowsheet Row Counselor from 02/15/2023 in [facility] Office Visit from 04/24/2022 in [facility] [place] Counselor  from 01/16/2022 in [facility] Video Visit from 05/27/2021 in [facility] Video Visit from 03/13/2021 in [facility]  PHQ-2 Total Score 4 0 5 3 2   PHQ-9 Total Score 15 2 14 6 7       Flowsheet Row Counselor from 01/16/2022 in [facility] Video Visit from 05/27/2021 in [facility]  C-SSRS RISK CATEGORY Low Risk No Risk        Assessment and Plan:  Kathryn [name] is a 28 yr old female who presents for Follow Up and Medication Management. PPHx is significant for Bipolar Disorder, GAD, PTSD, and ADHD.   Karon has had a significant stressor in that she is going through a break-up.  This has impacted her sleep, however, at this point her mood has remained stable and she is not showing any signs of mania/hypomania.  We have advised her to reduce her use of Adderall over the next week or so.  She is reporting some nausea and vomiting with her increase in anxiety so we will provide a small prescription of Zofran.  We will not make any other changes to her medications at this time.  Refills were sent in.  She will return for follow-up approximately 2-3 months.   Bipolar 1 disorder, depressed  GAD -Continue Effexor XR 75 mg daily for depression and anxiety.  30 tablets with 2 refills. -Continue Risperdal 1 mg QHS for mood stability.  30 tablets with 2 refills. -Continue Lamictal 200 mg QHS for mood stability.  30 tablets with 2 refills. -Start Zofran 4 mg q8 PRN nausea/vomiting due to anxiety.  10 tablets with 0 refills.     ADHD -Continue Adderall 25 mg BID.  120 (12.5 mg) tablets with 2 refills.  Last fill 1/28 so First fill 2/28    Collaboration of Care: Collaboration of Care: Other provider involved in patient's care AEB [facility] Therapist  Patient/Guardian was advised Release of Information must be obtained prior to any record release in order to  collaborate their care with an outside provider. Patient/Guardian was advised if they have not already done so to contact the registration department to sign all necessary forms in order for [name] to release information regarding their care.   Consent: Patient/Guardian gives verbal consent for treatment and assignment of benefits for services provided during this visit. Patient/Guardian expressed understanding and agreed to proceed.    Lauro Franklin, MD 03/19/2023, 9:04 AM   Follow Up Instructions:    I discussed the assessment and treatment plan with the patient. The patient was provided an opportunity to ask questions and all were answered. The patient agreed with the plan and demonstrated an understanding of the instructions.   The patient was advised to call back or seek an in-person evaluation if the symptoms worsen or if the condition fails to improve as anticipated.  I provided 10 minutes of non-face-to-face time during this encounter.   Lauro Franklin, MD

## 2023-03-22 ENCOUNTER — Ambulatory Visit (INDEPENDENT_AMBULATORY_CARE_PROVIDER_SITE_OTHER): Payer: No Payment, Other | Admitting: Mental Health

## 2023-03-22 DIAGNOSIS — F411 Generalized anxiety disorder: Secondary | ICD-10-CM

## 2023-03-22 DIAGNOSIS — F4310 Post-traumatic stress disorder, unspecified: Secondary | ICD-10-CM | POA: Diagnosis not present

## 2023-03-22 DIAGNOSIS — F319 Bipolar disorder, unspecified: Secondary | ICD-10-CM | POA: Diagnosis not present

## 2023-03-22 NOTE — Progress Notes (Signed)
THERAPIST PROGRESS NOTE Virtual Visit via Video Note  I connected with Kathryn [name] on 03/22/23 at  8:00 AM EST by a video enabled telemedicine application and verified that I am speaking with the correct person using two identifiers.  Location: Patient: home address on file Provider: home office   I discussed the limitations of evaluation and management by telemedicine and the availability of in person appointments. The patient expressed understanding and agreed to proceed.   I discussed the assessment and treatment plan with the patient. The patient was provided an opportunity to ask questions and all were answered. The patient agreed with the plan and demonstrated an understanding of the instructions.   The patient was advised to call back or seek an in-person evaluation if the symptoms worsen or if the condition fails to improve as anticipated.  I provided 48 minutes of non-face-to-face time during this encounter.   Dorris Singh, [facility]   Session Time: 8:05am ( 48 minutes)  Participation Level: Active  Behavioral Response: CasualAlertAnxious  Type of Therapy: Individual Therapy  Treatment Goals addressed:  STG: Kathryn [name] will increase overall wellness/ development of healthy habits AEB development of daily routine with improvement in physical health with reduced drinking and binge eating behaviors per self report within the next 90 days.    STG: Kathryn [name] will increase management of moods AEB development of effective coping skills with ability to explore and engage in enjoyable activities and self-care weekly withn the next 6 months   ProgressTowards Goals: Progressing  Interventions: CBT and Supportive  Summary:  Kathryn [name] is a 28 y.o. female who presents with dx of bipolar d/o, PTSD, GAD and ADHD. Kathryn [name] presents alert and oriented; mood and affect stable, slightly anxious.  Speech clear and coherent at normal rate and tone. Engaged and receptive to interventions.  Shares for moods to have increased with anxiety and shares presence of panic attacks. Notes recent changes in life with no longer being in relationship and no longer living with previous partner. Notes to have returned to previous residence. Notes use of attempting to cope with changes with distraction and keeping busy. Explored with therapist current feelings and thoughts and works to reframe thoughts surrounding ending of relationship with therapist support. Shares with therapist factors that lead to her decision to leave. Reports to be unclear of what she wants to do with her life and shares will move forward of exploration of positions and possible school hours. Ongoing work towards goals noting some regression, lacking eating, poor sleep but attending to routine. Denies suicidal thoughts.  Suicidal/Homicidal: Nowithout intent/plan  Therapist Response: Therapist engaged Kathryn [name] in [employer]. Confirmed current location and ability to hold confidential session. Therapist engaged Marqueta in check in and assessed for current level of functioning sxs management. Active empathic listening providing support and encouragement. Provided safe space for Vannah to share thoughts and feelings in regards to break up. Engaged in guided discovery exploring ability to process events in balanced manner. Encouraged Dori to trust her decision making and explored thoughts of family. Processed personal and emotional boundaries. Encouraged to explore ability to continue to work towards growth and exploration career options. No safety concerns reported.   Plan: Return again in x 5 weeks.  Diagnosis: Bipolar 1 disorder, depressed (HCC)  GAD (generalized anxiety disorder)  Post-traumatic stress  Collaboration of Care: Other None  Patient/Guardian was advised Release of Information must be obtained prior to any record release in order to collaborate their care with an outside provider.  Patient/Guardian was  advised if they have not already done so to contact the registration department to sign all necessary forms in order for [name] to release information regarding their care.   Consent: Patient/Guardian gives verbal consent for treatment and assignment of benefits for services provided during this visit. Patient/Guardian expressed understanding and agreed to proceed.   Stephan Minister [place], [facility] 03/22/2023

## 2023-04-05 ENCOUNTER — Ambulatory Visit (INDEPENDENT_AMBULATORY_CARE_PROVIDER_SITE_OTHER): Payer: No Payment, Other | Admitting: Mental Health

## 2023-04-05 DIAGNOSIS — F319 Bipolar disorder, unspecified: Secondary | ICD-10-CM | POA: Diagnosis not present

## 2023-04-05 DIAGNOSIS — F411 Generalized anxiety disorder: Secondary | ICD-10-CM

## 2023-04-05 NOTE — Progress Notes (Signed)
 THERAPIST PROGRESS NOTE Virtual Visit via Video Note  I connected with Kathryn [name] on 04/05/23 at  9:00 AM EST by a video enabled telemedicine application and verified that I am speaking with the correct person using two identifiers.  Location: Patient: home address Provider: home office   I discussed the limitations of evaluation and management by telemedicine and the availability of in person appointments. The patient expressed understanding and agreed to proceed.  I discussed the assessment and treatment plan with the patient. The patient was provided an opportunity to ask questions and all were answered. The patient agreed with the plan and demonstrated an understanding of the instructions.   The patient was advised to call back or seek an in-person evaluation if the symptoms worsen or if the condition fails to improve as anticipated.  I provided 48 minutes of non-face-to-face time during this encounter.   Dorris Singh, [facility]   Session Time: 9:05 am ( 48 minutes)  Participation Level: Active  Behavioral Response: CasualAlertEuthymic  Type of Therapy: Individual Therapy  Treatment Goals addressed:   STG: Kathryn [name] will increase overall wellness/ development of healthy habits AEB development of daily routine with improvement in physical health with reduced drinking and binge eating behaviors per self report within the next 90 days.     STG: Kathryn [name] will increase management of moods AEB development of effective coping skills with ability to explore and engage in enjoyable activities and self-care weekly withn the next 6 months  ProgressTowards Goals: Progressing  Interventions: Supportive  Summary:  Kathryn [name] is a 28 y.o. female who presents with dx of bipolar d/o, PTSD, GAD and ADHD. Kathryn [name] presents alert and oriented; mood and affect stable, slightly elevated.  Speech clear and coherent at normal rate and tone. Engaged and receptive to interventions. Shares for  moods to have been more stable but continues to have dreams and thoughts about ending of relationship. Shares consistent engagement in routine and shares for this to be going well. Notes some frustration with work and only working 25 hours needing 30 to be full time with benefits for health insurance. Shares thoughts of exploring what she would like to do employment wise in future with unclear if current work is for her long term. Explored with therapist hx of relationship and processing events. Denies safety concerns. Ongoing work towards goals; denies SI/HI  Suicidal/Homicidal: Nature conservation officer Response: Paramedic engaged Museum/gallery exhibitions officer in Retail banker. Confirmed current location and ability to hold confidential session. Therapist engaged Kathryn [name] in check in and assessed for current level of functioning sxs management. Active empathic listening providing support and encouragement. Provided safe space for Kathryn [name] to share thoughts and feelings in regard to current stressors and provided supportive feedback. Explored progress with goals and current areas of concerns. Supported in processing thoughts on employment and educated on website for exploration of careers. Supported in processing hx of relationship. Processed moving forward with personal development. Reviewed session and provided follow up.   Plan: Return again in  x 4 weeks.  Diagnosis: Bipolar 1 disorder, depressed (HCC)  GAD (generalized anxiety disorder)  Collaboration of Care: Other None  Patient/Guardian was advised Release of Information must be obtained prior to any record release in order to collaborate their care with an outside provider. Patient/Guardian was advised if they have not already done so to contact the registration department to sign all necessary forms in order for [name] to release information regarding their care.   Consent: Patient/Guardian gives verbal consent for  treatment and assignment of benefits for  services provided during this visit. Patient/Guardian expressed understanding and agreed to proceed.   Stephan Minister [place], [facility] 04/05/2023

## 2023-05-03 ENCOUNTER — Ambulatory Visit (INDEPENDENT_AMBULATORY_CARE_PROVIDER_SITE_OTHER): Payer: No Payment, Other | Admitting: Mental Health

## 2023-05-03 DIAGNOSIS — F319 Bipolar disorder, unspecified: Secondary | ICD-10-CM | POA: Diagnosis not present

## 2023-05-03 DIAGNOSIS — F4310 Post-traumatic stress disorder, unspecified: Secondary | ICD-10-CM

## 2023-05-03 DIAGNOSIS — F411 Generalized anxiety disorder: Secondary | ICD-10-CM

## 2023-05-03 NOTE — Progress Notes (Signed)
 THERAPIST PROGRESS NOTE Virtual Visit via Video Note  I connected with Kathryn [name] on 05/03/23 at  8:00 AM EDT by a video enabled telemedicine application and verified that I am speaking with the correct person using two identifiers.  Location: Patient: hone address on file Provider: home office   I discussed the limitations of evaluation and management by telemedicine and the availability of in person appointments. The patient expressed understanding and agreed to proceed.  I discussed the assessment and treatment plan with the patient. The patient was provided an opportunity to ask questions and all were answered. The patient agreed with the plan and demonstrated an understanding of the instructions.   The patient was advised to call back or seek an in-person evaluation if the symptoms worsen or if the condition fails to improve as anticipated.  I provided 50 minutes of non-face-to-face time during this encounter.   Dorris Singh, [facility]   Session Time: 8:05 am (   Participation Level: Active  Behavioral Response: CasualAlertEuthymic  Type of Therapy: Individual Therapy  Treatment Goals addressed:  STG: Kathryn [name] will increase overall wellness/ development of healthy habits AEB development of daily routine with improvement in physical health with reduced drinking and binge eating behaviors per self report within the next 90 days.     STG: Kathryn [name] will increase management of moods AEB development of effective coping skills with ability to explore and engage in enjoyable activities and self-care weekly withn the next 6 months   ProgressTowards Goals: Progressing  Interventions: Supportive  Summary:  Kathryn [name] is a 28 y.o. female who presents with dx of bipolar d/o, PTSD, GAD and ADHD. Anjelique presents alert and oriented; mood and affect stable.  Speech clear and coherent at normal rate and tone. Engaged and receptive to interventions. Shares for moods to have been more  stable. Notes feelings of loneliness and not having anyone to talk to. Shares to have friendships that are like family and other associates but denies for it to be the same. 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 Shares thoughts of current employment vs past and associates in which she still engages with. Explored level interactions with others and ability to increase balance with exploration of engagement in community with social interaction with others. Agrees to explore next steps career wise in which she feels is best for her. Denies safety concerns.   Suicidal/Homicidal: Nowithout intent/plan  Therapist Response:  Therapist engaged Kathryn [name] in [employer]. Confirmed current location and ability to hold confidential session. Therapist engaged Edlin in check in and assessed for current level of functioning sxs management. Active empathic listening providing support and encouragement. Provided safe space for Kathryn [name] to share thoughts and feelings in regard to current concerns. Explored ability to be full self with others and thoughts on holding authentic friendships and working through feelings of loneliness. Encouraged increase social interaction and exploration of ability to budget for outings in community. Explored thoughts on employment and thoughts on what would be best for her. Reviewed session and provided follow up.   Plan: Return again in  x 4 weeks.  Diagnosis: Bipolar 1 disorder, depressed (HCC)  GAD (generalized anxiety disorder)  Post-traumatic stress  Collaboration of Care: Other None  Patient/Guardian was advised Release of Information must be obtained prior to any record release in order to collaborate their care with an  outside provider. Patient/Guardian was advised if they have not already done so to contact the registration department to sign all necessary forms  in order for [name] to release information regarding their care.   Consent: Patient/Guardian gives verbal consent for treatment and assignment of benefits for services provided during this visit. Patient/Guardian expressed understanding and agreed to proceed.   Stephan Minister [place], [facility] 05/03/2023

## 2023-05-14 ENCOUNTER — Encounter (HOSPITAL_COMMUNITY): Payer: Self-pay | Admitting: Student in an Organized Health Care Education/Training Program

## 2023-05-14 ENCOUNTER — Telehealth (INDEPENDENT_AMBULATORY_CARE_PROVIDER_SITE_OTHER): Payer: No Payment, Other | Admitting: Student in an Organized Health Care Education/Training Program

## 2023-05-14 DIAGNOSIS — F900 Attention-deficit hyperactivity disorder, predominantly inattentive type: Secondary | ICD-10-CM | POA: Diagnosis not present

## 2023-05-14 DIAGNOSIS — F319 Bipolar disorder, unspecified: Secondary | ICD-10-CM

## 2023-05-14 DIAGNOSIS — G4700 Insomnia, unspecified: Secondary | ICD-10-CM

## 2023-05-14 DIAGNOSIS — F411 Generalized anxiety disorder: Secondary | ICD-10-CM

## 2023-05-14 MED ORDER — AMPHETAMINE-DEXTROAMPHETAMINE 12.5 MG PO TABS: 25.0000 mg | ORAL_TABLET | Freq: Two times a day (BID) | ORAL | 0 refills | Status: DC

## 2023-05-14 MED ORDER — MIRTAZAPINE 7.5 MG PO TABS: 7.5000 mg | ORAL_TABLET | Freq: Every day | ORAL | 2 refills | Status: DC

## 2023-05-14 MED ORDER — RISPERIDONE 1 MG PO TABS: 1.0000 mg | ORAL_TABLET | Freq: Every day | ORAL | 2 refills | Status: DC

## 2023-05-14 MED ORDER — LAMOTRIGINE 200 MG PO TABS
200.0000 mg | ORAL_TABLET | Freq: Every day | ORAL | 2 refills | Status: DC
Start: 2023-05-14 — End: 2023-07-09

## 2023-05-14 MED ORDER — VENLAFAXINE HCL ER 75 MG PO CP24
ORAL_CAPSULE | ORAL | 2 refills | Status: DC
Start: 2023-05-14 — End: 2023-07-09

## 2023-05-14 NOTE — Progress Notes (Addendum)
 BH MD/PA/NP OP Progress Note  Virtual Visit via Video Note  I connected with Kathryn [name] on 05/14/23 at  9:00 AM EDT by a video enabled telemedicine application and verified that I am speaking with the correct person using two identifiers.  Location: Patient: Work/Car Provider: [facility]   I discussed the limitations of evaluation and management by telemedicine and the availability of in person appointments. The patient expressed understanding and agreed to proceed.    05/14/2023 9:40 AM Kathryn [name]  MRN:  [id number]  Chief Complaint:  Chief Complaint  Patient presents with   Follow-up   Depression   Anxiety   HPI:  Kathryn [name] is a 28 yr old female who presents for Follow Up and Medication Management. PPHx is significant for Bipolar Disorder, GAD, PTSD, and ADHD.  She reports she has continued to have some issues since her last appointment.  She reports that her mood has remained stable.  She reports she has been doing better in setting up a routine.  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  She reports continuing to have issues with anxiety and depression.  She reports her biggest concern is her continued trouble with sleep.  She reports that it is every night.  Discussed past trials of medications.  She reports that trazodone made her "drunk" and that hydroxyzine gave her night terrors.  She reports Ambien was helpful.  When asked if she had ever trialed Remeron she reports she has not.  Discussed with her that given her continued issues with depression and anxiety it would be worth trialing Remeron as it can also help with sleep.  Discuss potential risks and side effects she was agreeable to the trial.  She reports no SI, HI, or AVH.  She reports her sleep is poor.  She reports her appetite is good.  She reports no side effects to her medications.  She reports no other concerns at present.  She will return for  follow-up in approximately 2 months.    Visit Diagnosis:    ICD-10-CM   1. Bipolar 1 disorder, depressed (HCC)  F31.9 risperiDONE (RISPERDAL) 1 MG tablet    lamoTRIgine (LAMICTAL) 200 MG tablet    venlafaxine XR (EFFEXOR-XR) 75 MG 24 hr capsule    mirtazapine (REMERON) 7.5 MG tablet    2. Attention deficit hyperactivity disorder (ADHD), predominantly inattentive type  F90.0 amphetamine-dextroamphetamine (ADDERALL) 12.5 MG tablet    amphetamine-dextroamphetamine (ADDERALL) 12.5 MG tablet    3. GAD (generalized anxiety disorder)  F41.1 venlafaxine XR (EFFEXOR-XR) 75 MG 24 hr capsule    mirtazapine (REMERON) 7.5 MG tablet    4. Insomnia, unspecified type  G47.00 mirtazapine (REMERON) 7.5 MG tablet          Past Psychiatric History: Bipolar Disorder, GAD, PTSD, and ADHD.   Past Medical History:  Past Medical History:  Diagnosis Date   Abnormal liver function tests 02/02/2016   ADHD 06/10/2016   Alcohol use 02/23/2016   Allergy    Anxiety    Arm fracture, right    as child, fx in three places, no surgery   Depression    Depression with anxiety 04-28-14   PTSD s/p death of best friend    Essential hypertension 02/02/2016   GERD (gastroesophageal reflux disease)    H/O cold sores 04/15/2015   Headache 04/09/2016   Hyperlipidemia, mixed 02/02/2016   Migraine    Ovarian cyst  PCOS (polycystic ovarian syndrome)    Preventative health care 10/27/2015   Thrombocytosis 04/09/2016   Vaginal Pap smear, abnormal    HGSIL    Past Surgical History:  Procedure Laterality Date   ADENOIDECTOMY  2002   TYMPANOSTOMY TUBE PLACEMENT Bilateral 1999    Family Psychiatric History: Maternal Aunt- Anxiety Maternal Grandfather- Anxiety Mother- Anxiety and OCD  Family History:  Family History  Problem Relation Age of Onset   Hypertension Mother    Heart disease Mother        CAD, stent at 78, s/p cardiac arrest with Defib   Hyperlipidemia Mother    Diabetes Mother        s/p  gastric bypass   Obesity Mother        s/p gastric bypass   OCD Mother    Hyperlipidemia Father    Hypertension Father    Anxiety disorder Father    Asthma Sister    Hyperlipidemia Brother    Anxiety disorder Brother    Stroke Maternal Grandmother    Thyroid disease Maternal Grandmother    Anxiety disorder Maternal Grandmother    Heart disease Maternal Grandfather        MI first at 43, s/p stents and CABG   Hyperlipidemia Maternal Grandfather    Hypertension Maternal Grandfather    Stroke Maternal Grandfather    Cancer Maternal Grandfather        melanoma   Aneurysm Maternal Grandfather        brain   Kidney disease Maternal Grandfather        tumor   Heart disease Paternal Grandmother    Cancer Paternal Grandfather        ung and brain   Heart disease Maternal Uncle    Vision loss Maternal Uncle    Anxiety disorder Maternal Uncle    Anxiety disorder Maternal Aunt    Depression Maternal Aunt     Social History:  Social History   Socioeconomic History   Marital status: Single    Spouse name: Not on file   Number of children: 0   Years of education: 12   Highest education level: Not on file  Occupational History    Comment: [employer]  Tobacco Use   Smoking status: Heavy Smoker    Current packs/day: 1.50    Types: Cigarettes   Smokeless tobacco: Former    Types: Snuff  Vaping Use   Vaping status: Never Used  Substance and Sexual Activity   Alcohol use: Yes    Comment: maybe every 6 months   Drug use: Yes    Types: Marijuana    Comment: daily   Sexual activity: Yes    Partners: Male    Birth control/protection: Pill  Other Topics Concern   Not on file  Social History Narrative   Not on file   Social Drivers of Health   Financial Resource Strain: Medium Risk (01/16/2022)   Overall Financial Resource Strain (CARDIA)    Difficulty of Paying Living Expenses: Somewhat hard  Food Insecurity: No Food Insecurity (01/16/2022)   Hunger Vital Sign     Worried About Running Out of Food in the Last Year: Never true    Ran Out of Food in the Last Year: Never true  Transportation Needs: No Transportation Needs (01/16/2022)   PRAPARE - Administrator, Civil Service (Medical): No    Lack of Transportation (Non-Medical): No  Physical Activity: Inactive (01/16/2022)   Exercise Vital Sign  Days of Exercise per Week: 0 days    Minutes of Exercise per Session: 0 min  Stress: No Stress Concern Present (01/16/2022)   [employer] of Occupational Health - Occupational Stress Questionnaire    Feeling of Stress : Only a little  Social Connections: Socially Isolated (01/16/2022)   Social Connection and Isolation Panel [NHANES]    Frequency of Communication with Friends and Family: More than three times a week    Frequency of Social Gatherings with Friends and Family: Never    Attends Religious Services: Never    Database administrator or Organizations: No    Attends Banker Meetings: Never    Marital Status: Never married    Allergies:  Allergies  Allergen Reactions   Oxycodone Nausea Only, Nausea And Vomiting and Other (See Comments)    Oxycontin also Oxycontin also Oxycontin also Oxycontin also    Quetiapine Fumarate Other (See Comments)    Mental status changes Mental status changes Mental status changes Mental status changes     Metabolic Disorder Labs: Lab Results  Component Value Date   HGBA1C 5.2 11/17/2018   No results found for: "PROLACTIN" Lab Results  Component Value Date   CHOL 222 (H) 06/18/2021   TRIG 72.0 06/18/2021   HDL 56.90 06/18/2021   CHOLHDL 4 06/18/2021   VLDL 14.4 06/18/2021   LDLCALC 150 (H) 06/18/2021   LDLCALC 166 (H) 11/17/2018   Lab Results  Component Value Date   TSH 0.735 11/17/2018   TSH 2.49 05/05/2017    Therapeutic Level Labs: No results found for: "LITHIUM" No results found for: "VALPROATE" No results found for: "CBMZ"  Current Medications: Current  Outpatient Medications  Medication Sig Dispense Refill   mirtazapine (REMERON) 7.5 MG tablet Take 1 tablet (7.5 mg total) by mouth at bedtime. 30 tablet 2   [START ON 06/08/2023] amphetamine-dextroamphetamine (ADDERALL) 12.5 MG tablet Take 2 tablets by mouth 2 (two) times daily. 120 tablet 0   [START ON 07/09/2023] amphetamine-dextroamphetamine (ADDERALL) 12.5 MG tablet Take 2 tablets by mouth 2 (two) times daily. 120 tablet 0   [START ON 08/06/2023] amphetamine-dextroamphetamine (ADDERALL) 12.5 MG tablet Take 2 tablets by mouth 2 (two) times daily. 120 tablet 0   ferrous sulfate 325 (65 FE) MG tablet Take 325 mg by mouth daily with breakfast.     lamoTRIgine (LAMICTAL) 200 MG tablet Take 1 tablet (200 mg total) by mouth at bedtime. Take one a day 30 tablet 2   Levonorgestrel-Ethinyl Estradiol (AMETHIA) 0.15-0.03 &0.01 MG tablet Take 1 tablet by mouth daily. 91 tablet 4   omeprazole (PRILOSEC) 40 MG capsule Take 1 capsule (40 mg total) by mouth 2 (two) times daily. 180 capsule 3   ondansetron (ZOFRAN) 4 MG tablet Take 1 tablet (4 mg total) by mouth every 8 (eight) hours as needed for nausea or vomiting. 10 tablet 0   risperiDONE (RISPERDAL) 1 MG tablet Take 1 tablet (1 mg total) by mouth at bedtime. 30 tablet 2   valACYclovir (VALTREX) 1000 MG tablet Take 1 tablet (1,000 mg total) by mouth daily. 90 tablet 4   venlafaxine XR (EFFEXOR-XR) 75 MG 24 hr capsule TAKE ONE CAPSULE BY MOUTH DAILY  WITH BREAKFAST 30 capsule 2   vitamin B-12 (CYANOCOBALAMIN) 500 MCG tablet Take 500 mcg by mouth daily.     No current facility-administered medications for this visit.     Musculoskeletal: Strength & Muscle Tone: within normal limits Gait & Station:  Sitting During Interview Patient leans: N/A  Psychiatric Specialty Exam: Review of Systems  Respiratory:  Negative for shortness of breath.   Cardiovascular:  Negative for chest pain.  Gastrointestinal:  Negative for abdominal pain, constipation, diarrhea,  nausea and vomiting.  Neurological:  Negative for dizziness, weakness and headaches.  Psychiatric/Behavioral:  Positive for dysphoric mood and sleep disturbance. The patient is nervous/anxious.     There were no vitals taken for this visit.There is no height or weight on file to calculate BMI.  General Appearance: Casual and Fairly Groomed  Eye Contact:  Good  Speech:  Clear and Coherent and Normal Rate  Volume:  Normal  Mood:  Anxious and Dysphoric  Affect:  Congruent  Thought Process:  Coherent and Goal Directed  Orientation:  Full (Time, Place, and Person)  Thought Content: WDL and Logical   Suicidal Thoughts:  No  Homicidal Thoughts:  No  Memory:  Immediate;   Good Recent;   Good  Judgement:  Good  Insight:  Good  Psychomotor Activity:  Normal  Concentration:  Concentration: Good and Attention Span: Good  Recall:  Good  Fund of Knowledge: Good  Language: Good  Akathisia:  Negative  Handed:  Right  AIMS (if indicated): not done  Assets:  Communication Skills Desire for Improvement Housing Physical Health Resilience  ADL's:  Intact  Cognition: WNL  Sleep:  Poor    Screenings: AUDIT    Advertising copywriter from 01/16/2022 in [facility]  Alcohol Use Disorder Identification Test Final Score (AUDIT) 8      GAD-7    Flowsheet Row Counselor from 02/15/2023 in [facility] Visit from 04/24/2022 in [facility] [employer] Counselor from 01/16/2022 in [facility] Video Visit from 05/27/2021 in [facility] Video Visit from 03/13/2021 in [facility]  Total GAD-7 Score 8 0 7 3 1       PHQ2-9    Flowsheet Row Counselor from 02/15/2023 in [facility] Office Visit from 04/24/2022 in [facility] [place] Counselor from 01/16/2022 in [facility] Video Visit from 05/27/2021 in [facility] Video Visit from 03/13/2021 in [facility]  PHQ-2 Total Score 4 0 5 3 2   PHQ-9 Total Score 15 2 14 6 7       Flowsheet Row Counselor from 01/16/2022 in [facility] Video Visit from 05/27/2021 in [facility]  C-SSRS RISK CATEGORY Low Risk No Risk        Assessment and Plan:  Kathryn [name] is a 28 yr old female who presents for Follow Up and Medication Management. PPHx is significant for Bipolar Disorder, GAD, PTSD, and ADHD.   Kathryn [name] has continued to have issues with depression and anxiety but that her mood has remained stable.  She reports significant issue with sleep specifically falling asleep.  To address these issues we will trial low-dose Remeron.  We will not make any other changes to her medication at this time.  Refills were sent in.  She will return follow-up in approximately 2 months.   Bipolar 1 disorder, depressed  GAD -Continue Effexor XR 75 mg daily for depression and anxiety.  30 tablets with 2 refills. -Start Remeron 7.5 mg QHS for depression, anxiety, and sleep.  30 tablets with 2 refills. -Continue Risperdal 1 mg QHS for mood stability.  30 tablets with 2  refills. -Continue Lamictal 200 mg QHS for mood stability.  30 tablets with 2 refills.      ADHD -Continue Adderall 25 mg BID.  120 (12.5 mg) tablets with 1 refill.  Last fill 2/25 and has one more refill so First fill 5/23    Collaboration of Care: Collaboration of Care: Other provider involved in patient's care AEB [facility] Therapist  Patient/Guardian was advised Release of Information must be obtained prior to any record release in order to collaborate their care with an outside provider. Patient/Guardian was advised if they have not already done so to contact the registration department to sign all necessary forms in order for [name] to  release information regarding their care.   Consent: Patient/Guardian gives verbal consent for treatment and assignment of benefits for services provided during this visit. Patient/Guardian expressed understanding and agreed to proceed.    Lauro Franklin, MD 05/14/2023, 9:40 AM   Follow Up Instructions:    I discussed the assessment and treatment plan with the patient. The patient was provided an opportunity to ask questions and all were answered. The patient agreed with the plan and demonstrated an understanding of the instructions.   The patient was advised to call back or seek an in-person evaluation if the symptoms worsen or if the condition fails to improve as anticipated.  I provided 16 minutes of non-face-to-face time during this encounter.   Lauro Franklin, MD

## 2023-05-14 NOTE — Addendum Note (Signed)
 Addended by: Lauro Franklin on: 05/14/2023 10:55 AM   Modules accepted: Level of Service

## 2023-05-21 ENCOUNTER — Ambulatory Visit (HOSPITAL_COMMUNITY): Payer: No Payment, Other | Admitting: Mental Health

## 2023-06-25 ENCOUNTER — Ambulatory Visit (INDEPENDENT_AMBULATORY_CARE_PROVIDER_SITE_OTHER): Admitting: Mental Health

## 2023-06-25 DIAGNOSIS — F319 Bipolar disorder, unspecified: Secondary | ICD-10-CM

## 2023-06-25 DIAGNOSIS — F4310 Post-traumatic stress disorder, unspecified: Secondary | ICD-10-CM

## 2023-06-25 NOTE — Progress Notes (Unsigned)
 THERAPIST PROGRESS NOTE Virtual Visit via Video Note  I connected with Bryn Capuchin on 06/25/23 at  8:00 AM EDT by a video enabled telemedicine application and verified that I am speaking with the correct person using two identifiers.  Location: Patient: home address on file Provider: home office   I discussed the limitations of evaluation and management by telemedicine and the availability of in person appointments. The patient expressed understanding and agreed to proceed.   I discussed the assessment and treatment plan with the patient. The patient was provided an opportunity to ask questions and all were answered. The patient agreed with the plan and demonstrated an understanding of the instructions.   The patient was advised to call back or seek an in-person evaluation if the symptoms worsen or if the condition fails to improve as anticipated.  I provided 50 minutes of non-face-to-face time during this encounter.   Loman Risk, [facility]  Session Time: 8:06 am (  Participation Level: Active  Behavioral Response: CasualAlertWNL  Type of Therapy: Individual Therapy  Treatment Goals addressed: STG: Margareth will increase overall wellness/ development of healthy habits AEB development of daily routine with improvement in physical health with reduced drinking and binge eating behaviors per self report within the next 90 days.     STG: Shaelan will increase management of moods AEB development of effective coping skills with ability to explore and engage in enjoyable activities and self-care weekly withn the next 6 months   ProgressTowards Goals: Progressing  Interventions: Supportive  Summary:Suad Dewindt is a 28 y.o. female who presents with dx of bipolar d/o, PTSD, GAD and ADHD. Saharrah presents alert and oriented; mood and affect stable.  Speech clear and coherent at normal rate and tone. Engaged and receptive to interventions. Shares for moods  have been "not bad, not  good." Shares to have increased In her drinking behaviors as well some PTSD sxs with nightmares of [place] Clarks. Shares additional work related stress and financial stress with decrease in hours recently with her client to have been sick. Shares thoughts of ex and continuing to have thoughts in regard to relationship ending. Shares events of interacting with deceased ex girlfriends daughter's father. Processed with therapist current stressors and ability to manage difficult feelings and coping with drinking behaviors. Explored feelings of abandonment and assumptions of being a burden to supports if shares thoughts and feelings with them. Able to process with clinician working to utilize support system and decrease drinking behaviors. Denies safety concerns. Regression with goals.   Suicidal/Homicidal: Nowithout intent/plan  Therapist Response: Therapist engaged L[place]ha in [employer]. Confirmed current location and ability to hold confidential session. Therapist engaged Maykayla in check in and assessed for current level of functioning sxs management. Active empathic listening providing support and encouragement. Provided safe space for Adja to share thoughts and feelings in regard to recent events. Provided support in processing events. Explored and affirmed ability to handle situations well without emotional outburst. Explored current increase in drinking behaviors. Explored ability to process uncomfortable emotions and use of effective prosocial coping skills. Identified maladaptive thinking in regard to access to natural supports for emotional support and supported in reframing thoughts. Reviewed session and provided follow up.   Plan: Return again in  x 7 weeks.  Diagnosis: Bipolar 1 disorder, depressed (HCC)  Post-traumatic stress  Collaboration of Care: Other None  Patient/Guardian was advised Release of Information must be obtained prior to any record release in order to collaborate their  care with an  outside provider. Patient/Guardian was advised if they have not already done so to contact the registration department to sign all necessary forms in order for us  to release information regarding their care.   Consent: Patient/Guardian gives verbal consent for treatment and assignment of benefits for services provided during this visit. Patient/Guardian expressed understanding and agreed to proceed.   Carmel Chimes [place], [facility] 06/25/2023

## 2023-07-09 ENCOUNTER — Encounter (HOSPITAL_COMMUNITY): Payer: Self-pay | Admitting: Student in an Organized Health Care Education/Training Program

## 2023-07-09 ENCOUNTER — Telehealth (INDEPENDENT_AMBULATORY_CARE_PROVIDER_SITE_OTHER): Admitting: Student in an Organized Health Care Education/Training Program

## 2023-07-09 DIAGNOSIS — F319 Bipolar disorder, unspecified: Secondary | ICD-10-CM

## 2023-07-09 DIAGNOSIS — F900 Attention-deficit hyperactivity disorder, predominantly inattentive type: Secondary | ICD-10-CM | POA: Diagnosis not present

## 2023-07-09 DIAGNOSIS — F411 Generalized anxiety disorder: Secondary | ICD-10-CM

## 2023-07-09 MED ORDER — LAMOTRIGINE 200 MG PO TABS: 200.0000 mg | ORAL_TABLET | Freq: Every day | ORAL | 2 refills | Status: DC

## 2023-07-09 MED ORDER — VENLAFAXINE HCL ER 75 MG PO CP24: ORAL_CAPSULE | ORAL | 2 refills | Status: DC

## 2023-07-09 MED ORDER — AMPHETAMINE-DEXTROAMPHETAMINE 12.5 MG PO TABS: 25.0000 mg | ORAL_TABLET | Freq: Two times a day (BID) | ORAL | 0 refills | Status: DC

## 2023-07-09 MED ORDER — RISPERIDONE 1 MG PO TABS: 1.0000 mg | ORAL_TABLET | Freq: Every day | ORAL | 2 refills | Status: DC

## 2023-07-09 MED ORDER — PRAZOSIN HCL 1 MG PO CAPS: 1.0000 mg | ORAL_CAPSULE | Freq: Every day | ORAL | 2 refills | Status: DC

## 2023-07-09 NOTE — Progress Notes (Signed)
 BH MD/PA/NP OP Progress Note  Virtual Visit via Video Note  I connected with Kathryn [name] on 07/09/23 at  9:00 AM EDT by a video enabled telemedicine application and verified that I am speaking with the correct person using two identifiers.  Location: Patient: Work/Car Provider: [facility]   I discussed the limitations of evaluation and management by telemedicine and the availability of in person appointments. The patient expressed understanding and agreed to proceed.    07/09/2023 9:23 AM Kathryn [name]  MRN:  [id number]  Chief Complaint:  Chief Complaint  Patient presents with   Follow-up   Medication Refill   nightmares   HPI:  Kathryn [name] is a 28 yr old female who presents for Follow Up and Medication Management. PPHx is significant for Bipolar Disorder, GAD, PTSD, and ADHD.  She reports she has been doing okay since her last appointment.  She reports that her mood has remained stable however her issues with sleep continue.  She reports that she tried the Remeron  for a few days but always felt overly tired and fatigued the next day after taking it.  She reports that due to this she stopped taking the Remeron .  Discussed her issues with sleep and she reports it is due to having nightmares frequently.  Discussed trialing prazosin to address this.  Discussed potential risks and side effects and using caution when getting up out of bed for the first few days and she reported understanding and was agreeable to the trial.  Discussed with her that we would not make any other changes to her medication at this time and she was agreeable with this.  She reports no SI, HI, or AVH.  She reports her sleep is poor.  She reports her appetite is good.  She reports no side effects to her medications.  Discussed with her that she would be transitioning to another provider for her next appointment and she reported understanding.  She will return for follow-up in approximately 2 to 3 months with Dr.  Flynn Hylan.   Visit Diagnosis:    ICD-10-CM   1. Bipolar 1 disorder, depressed (HCC)  F31.9 risperiDONE  (RISPERDAL ) 1 MG tablet    venlafaxine  XR (EFFEXOR -XR) 75 MG 24 hr capsule    lamoTRIgine  (LAMICTAL ) 200 MG tablet    2. Attention deficit hyperactivity disorder (ADHD), predominantly inattentive type  F90.0 amphetamine -dextroamphetamine  (ADDERALL) 12.5 MG tablet    3. GAD (generalized anxiety disorder)  F41.1 venlafaxine  XR (EFFEXOR -XR) 75 MG 24 hr capsule    prazosin (MINIPRESS) 1 MG capsule           Past Psychiatric History: Bipolar Disorder, GAD, PTSD, and ADHD.   Past Medical History:  Past Medical History:  Diagnosis Date   Abnormal liver function tests 02/02/2016   ADHD 06/10/2016   Alcohol use 02/23/2016   Allergy    Anxiety    Arm fracture, right    as child, fx in three places, no surgery   Depression    Depression with anxiety 2014/05/10   PTSD s/p death of best friend    Essential hypertension 02/02/2016   GERD (gastroesophageal reflux disease)    H/O cold sores 04/15/2015   Headache 04/09/2016   Hyperlipidemia, mixed 02/02/2016   Migraine    Ovarian cyst    PCOS (polycystic ovarian syndrome)    Preventative health care 10/27/2015   Thrombocytosis 04/09/2016   Vaginal Pap smear, abnormal    HGSIL    Past Surgical History:  Procedure Laterality Date   ADENOIDECTOMY  2002   TYMPANOSTOMY TUBE PLACEMENT Bilateral 1999    Family Psychiatric History: Maternal Aunt- Anxiety Maternal Grandfather- Anxiety Mother- Anxiety and OCD  Family History:  Family History  Problem Relation Age of Onset   Hypertension Mother    Heart disease Mother        CAD, stent at 33, s/p cardiac arrest with Defib   Hyperlipidemia Mother    Diabetes Mother        s/p gastric bypass   Obesity Mother        s/p gastric bypass   OCD Mother    Hyperlipidemia Father    Hypertension Father    Anxiety disorder Father    Asthma Sister    Hyperlipidemia Brother     Anxiety disorder Brother    Stroke Maternal Grandmother    Thyroid  disease Maternal Grandmother    Anxiety disorder Maternal Grandmother    Heart disease Maternal Grandfather        MI first at 62, s/p stents and CABG   Hyperlipidemia Maternal Grandfather    Hypertension Maternal Grandfather    Stroke Maternal Grandfather    Cancer Maternal Grandfather        melanoma   Aneurysm Maternal Grandfather        brain   Kidney disease Maternal Grandfather        tumor   Heart disease Paternal Grandmother    Cancer Paternal Grandfather        ung and brain   Heart disease Maternal Uncle    Vision loss Maternal Uncle    Anxiety disorder Maternal Uncle    Anxiety disorder Maternal Aunt    Depression Maternal Aunt     Social History:  Social History   Socioeconomic History   Marital status: Single    Spouse name: Not on file   Number of children: 0   Years of education: 12   Highest education level: Not on file  Occupational History    Comment: [employer]  Tobacco Use   Smoking status: Heavy Smoker    Current packs/day: 1.50    Types: Cigarettes   Smokeless tobacco: Former    Types: Snuff  Vaping Use   Vaping status: Never Used  Substance and Sexual Activity   Alcohol use: Yes    Comment: maybe every 6 months   Drug use: Yes    Types: Marijuana    Comment: daily   Sexual activity: Yes    Partners: Male    Birth control/protection: Pill  Other Topics Concern   Not on file  Social History Narrative   Not on file   Social Drivers of Health   Financial Resource Strain: Medium Risk (01/16/2022)   Overall Financial Resource Strain (CARDIA)    Difficulty of Paying Living Expenses: Somewhat hard  Food Insecurity: No Food Insecurity (01/16/2022)   Hunger Vital Sign    Worried About Running Out of Food in the Last Year: Never true    Ran Out of Food in the Last Year: Never true  Transportation Needs: No Transportation Needs (01/16/2022)   PRAPARE - Therapist, art (Medical): No    Lack of Transportation (Non-Medical): No  Physical Activity: Inactive (01/16/2022)   Exercise Vital Sign    Days of Exercise per Week: 0 days    Minutes of Exercise per Session: 0 min  Stress: No Stress Concern Present (01/16/2022)   [employer] of Occupational Health - Occupational Stress Questionnaire    Feeling  of Stress : Only a little  Social Connections: Socially Isolated (01/16/2022)   Social Connection and Isolation Panel [NHANES]    Frequency of Communication with Friends and Family: More than three times a week    Frequency of Social Gatherings with Friends and Family: Never    Attends Religious Services: Never    Database administrator or Organizations: No    Attends Banker Meetings: Never    Marital Status: Never married    Allergies:  Allergies  Allergen Reactions   Oxycodone  Nausea Only, Nausea And Vomiting and Other (See Comments)    Oxycontin  also Oxycontin  also Oxycontin  also Oxycontin  also    Quetiapine  Fumarate Other (See Comments)    Mental status changes Mental status changes Mental status changes Mental status changes     Metabolic Disorder Labs: Lab Results  Component Value Date   HGBA1C 5.2 11/17/2018   No results found for: "PROLACTIN" Lab Results  Component Value Date   CHOL 222 (H) 06/18/2021   TRIG 72.0 06/18/2021   HDL 56.90 06/18/2021   CHOLHDL 4 06/18/2021   VLDL 14.4 06/18/2021   LDLCALC 150 (H) 06/18/2021   LDLCALC 166 (H) 11/17/2018   Lab Results  Component Value Date   TSH 0.735 11/17/2018   TSH 2.49 05/05/2017    Therapeutic Level Labs: No results found for: "LITHIUM" No results found for: "VALPROATE" No results found for: "CBMZ"  Current Medications: Current Outpatient Medications  Medication Sig Dispense Refill   prazosin (MINIPRESS) 1 MG capsule Take 1 capsule (1 mg total) by mouth at bedtime. 30 capsule 2   amphetamine -dextroamphetamine  (ADDERALL) 12.5 MG  tablet Take 2 tablets by mouth 2 (two) times daily. 120 tablet 0   [START ON 08/06/2023] amphetamine -dextroamphetamine  (ADDERALL) 12.5 MG tablet Take 2 tablets by mouth 2 (two) times daily. 120 tablet 0   amphetamine -dextroamphetamine  (ADDERALL) 12.5 MG tablet Take 2 tablets by mouth 2 (two) times daily. 120 tablet 0   ferrous sulfate 325 (65 FE) MG tablet Take 325 mg by mouth daily with breakfast.     lamoTRIgine  (LAMICTAL ) 200 MG tablet Take 1 tablet (200 mg total) by mouth at bedtime. Take one a day 30 tablet 2   Levonorgestrel-Ethinyl Estradiol (AMETHIA) 0.15-0.03 &0.01 MG tablet Take 1 tablet by mouth daily. 91 tablet 4   omeprazole  (PRILOSEC) 40 MG capsule Take 1 capsule (40 mg total) by mouth 2 (two) times daily. 180 capsule 3   ondansetron  (ZOFRAN ) 4 MG tablet Take 1 tablet (4 mg total) by mouth every 8 (eight) hours as needed for nausea or vomiting. 10 tablet 0   risperiDONE  (RISPERDAL ) 1 MG tablet Take 1 tablet (1 mg total) by mouth at bedtime. 30 tablet 2   valACYclovir  (VALTREX ) 1000 MG tablet Take 1 tablet (1,000 mg total) by mouth daily. 90 tablet 4   venlafaxine  XR (EFFEXOR -XR) 75 MG 24 hr capsule TAKE ONE CAPSULE BY MOUTH DAILY  WITH BREAKFAST 30 capsule 2   vitamin B-12 (CYANOCOBALAMIN) 500 MCG tablet Take 500 mcg by mouth daily.     No current facility-administered medications for this visit.     Musculoskeletal: Strength & Muscle Tone: within normal limits Gait & Station: Sitting During Interview Patient leans: N/A  Psychiatric Specialty Exam: Review of Systems  Respiratory:  Negative for shortness of breath.   Cardiovascular:  Negative for chest pain.  Gastrointestinal:  Negative for abdominal pain, constipation, diarrhea, nausea and vomiting.  Neurological:  Negative for dizziness, weakness and headaches.  Psychiatric/Behavioral:  Positive for sleep disturbance. Negative for dysphoric mood, hallucinations and suicidal ideas. The patient is not nervous/anxious.      There were no vitals taken for this visit.There is no height or weight on file to calculate BMI.  General Appearance: Casual and Fairly Groomed  Eye Contact:  Good  Speech:  Clear and Coherent and Normal Rate  Volume:  Normal  Mood:  "ok"  Affect:  Congruent  Thought Process:  Coherent and Goal Directed  Orientation:  Full (Time, Place, and Person)  Thought Content: WDL and Logical   Suicidal Thoughts:  No  Homicidal Thoughts:  No  Memory:  Immediate;   Good Recent;   Good  Judgement:  Good  Insight:  Good  Psychomotor Activity:  Normal  Concentration:  Concentration: Good and Attention Span: Good  Recall:  Good  Fund of Knowledge: Good  Language: Good  Akathisia:  Negative  Handed:  Right  AIMS (if indicated): not done  Assets:  Communication Skills Desire for Improvement Housing Physical Health Resilience  ADL's:  Intact  Cognition: WNL  Sleep:  Poor    Screenings: AUDIT    Advertising copywriter from 01/16/2022 in [facility]  Alcohol Use Disorder Identification Test Final Score (AUDIT) 8      GAD-7    Flowsheet Row Counselor from 02/15/2023 in [facility] Office Visit from 04/24/2022 in [facility] [employer] Counselor from 01/16/2022 in [facility] Video Visit from 05/27/2021 in [facility] Video Visit from 03/13/2021 in [facility]  Total GAD-7 Score 8 0 7 3 1       PHQ2-9    Flowsheet Row Counselor from 02/15/2023 in [facility] Office Visit from 04/24/2022 in [facility] [facility] [place] Counselor from 01/16/2022 in [facility] Video Visit from 05/27/2021 in [facility] Video Visit from 03/13/2021 in [facility]  PHQ-2 Total Score 4 0 5 3 2   PHQ-9 Total Score  15 2 14 6 7       Flowsheet Row Counselor from 01/16/2022 in [facility] Video Visit from 05/27/2021 in [facility]  C-SSRS RISK CATEGORY Low Risk No Risk        Assessment and Plan:  Kathryn [name] is a 28 yr old female who presents for Follow Up and Medication Management. PPHx is significant for Bipolar Disorder, GAD, PTSD, and ADHD.   Issa has remained stable from a mood perspective.  She continues to have issues with sleep and had side effects to the Remeron  so she stopped this herself.  As she is having nightmares we will start prazosin to help address this in her sleep.  Will not make any other changes to her medications at this time.  Refills were sent in.  She will return for follow-up in approximately 2 to 3 months with Dr. Flynn Hylan.   Bipolar 1 disorder, depressed  GAD -Continue Effexor  XR 75 mg daily for depression and anxiety.  30 tablets with 2 refills. -Stop Remeron   -Start Prazosin 1 mg QHS for nightmares/sleep disturbance.  30 tablets with 2 refills. -Continue Risperdal  1 mg QHS for mood stability.  30 tablets with 2 refills. -Continue Lamictal  200 mg QHS for mood stability.  30 tablets with 2 refills.      ADHD -Continue Adderall 25 mg BID.  120 (  12.5 mg) tablets with 1 refill.  Last fill 06/13/23     Collaboration of Care: Collaboration of Care: Other provider involved in patient's care AEB [facility] Therapist  Patient/Guardian was advised Release of Information must be obtained prior to any record release in order to collaborate their care with an outside provider. Patient/Guardian was advised if they have not already done so to contact the registration department to sign all necessary forms in order for us  to release information regarding their care.   Consent: Patient/Guardian gives verbal consent for treatment and assignment of benefits for services provided during this visit. Patient/Guardian  expressed understanding and agreed to proceed.    Basilia Bosworth, DO 07/09/2023, 9:23 AM   Follow Up Instructions:    I discussed the assessment and treatment plan with the patient. The patient was provided an opportunity to ask questions and all were answered. The patient agreed with the plan and demonstrated an understanding of the instructions.   The patient was advised to call back or seek an in-person evaluation if the symptoms worsen or if the condition fails to improve as anticipated.  I provided 14 minutes of non-face-to-face time during this encounter.   Basilia Bosworth, MD

## 2023-08-23 ENCOUNTER — Ambulatory Visit (INDEPENDENT_AMBULATORY_CARE_PROVIDER_SITE_OTHER): Admitting: Mental Health

## 2023-08-23 DIAGNOSIS — F319 Bipolar disorder, unspecified: Secondary | ICD-10-CM

## 2023-08-23 DIAGNOSIS — F411 Generalized anxiety disorder: Secondary | ICD-10-CM | POA: Diagnosis not present

## 2023-08-23 DIAGNOSIS — F4310 Post-traumatic stress disorder, unspecified: Secondary | ICD-10-CM | POA: Diagnosis not present

## 2023-08-23 NOTE — Progress Notes (Signed)
 THERAPIST PROGRESS NOTE Virtual Visit via Video Note  I connected with Kathryn [name] on 08/23/23 at  8:00 AM EDT by a video enabled telemedicine application and verified that I am speaking with the correct person using two identifiers.  Location: Patient: home  Provider: home office   I discussed the limitations of evaluation and management by telemedicine and the availability of in person appointments. The patient expressed understanding and agreed to proceed.  I discussed the assessment and treatment plan with the patient. The patient was provided an opportunity to ask questions and all were answered. The patient agreed with the plan and demonstrated an understanding of the instructions.   The patient was advised to call back or seek an in-person evaluation if the symptoms worsen or if the condition fails to improve as anticipated.  I provided 45 minutes of non-face-to-face time during this encounter.   Ty Bernice Savant, [facility]   Session Time: 8:05 am ( 45 minutes)  Participation Level: Active  Behavioral Response: CasualAlertDysphoric  Type of Therapy: Individual Therapy  Treatment Goals addressed:  STG: Kathryn [name] will increase overall wellness/ development of healthy habits AEB development of daily routine with improvement in physical health with reduced drinking and binge eating behaviors per self report within the next 90 days.     STG: Kathryn [name] will increase management of moods AEB development of effective coping skills with ability to explore and engage in enjoyable activities and self-care weekly withn the next 6 months  ProgressTowards Goals: Not Progressing - regressed  Interventions: Supportive  Summary: Kathryn [name] is a 28 y.o. female who presents with dx of bipolar d/o, PTSD, GAD and ADHD. Kathryn [name] presents alert and oriented; mood and affect low, dysphoric.  Speech clear and coherent at normal rate and tone. Engaged and receptive to interventions. Shares for moods   have been low, I have regressed, all areas. Shares stressors of work noting to now dread going into work and shares with therapist factors that contribute to this. Shares desire and plan to eventually work in clinic vs home visits but is unsure of when she will be able to make that transition. Reports to have returned to daily drinking but has done better in the last week. Denies going to the gym, denies eating adequate meals and shares has gained weight. Explores with therapist working to reestablish routine and working to reengage in healthy habits. Difficulty setting limits with alcohol use but reports desire to slow down and admits to unhealthy relationship with alcohol. Shares to engage with friendships but overall feels exhausted. Reports daily medication management. Denies safety concerns.   Suicidal/Homicidal: Nowithout intent/plan  Therapist Response: Therapist engaged Kathryn [name] in [employer]. Confirmed current location and ability to hold confidential session. Therapist engaged Kathryn [name] in check in and assessed for current level of functioning sxs management. Active empathic listening providing support and encouragement. Provided safe space for Kathryn [name] to share thoughts and feelings in regard to work stressors. Engaged in solution building and exploring options to support in increased satisfaction with work. Explored ability to set emotional boundaries with self and not working harder than the family. Encouraged maintaining communication with boss. Explored working to engage in routine and provided education on unhealthy relationship with alcohol and explored motivations for change. Explored changes she is willing to make for increase level of functioning. Reviewed session and provided follow up.   Plan: Return again in x 2 weeks.  Diagnosis: Bipolar 1 disorder, depressed (HCC)  GAD (generalized anxiety disorder)  Post-traumatic stress  Collaboration of Care: Other  None  Patient/Guardian was advised Release of Information must be obtained prior to any record release in order to collaborate their care with an outside provider. Patient/Guardian was advised if they have not already done so to contact the registration department to sign all necessary forms in order for us  to release information regarding their care.   Consent: Patient/Guardian gives verbal consent for treatment and assignment of benefits for services provided during this visit. Patient/Guardian expressed understanding and agreed to proceed.   Ty Asal [place], [facility] 08/23/2023

## 2023-09-06 ENCOUNTER — Ambulatory Visit (INDEPENDENT_AMBULATORY_CARE_PROVIDER_SITE_OTHER): Admitting: Mental Health

## 2023-09-06 ENCOUNTER — Other Ambulatory Visit: Payer: Self-pay | Admitting: Family Medicine

## 2023-09-06 DIAGNOSIS — B009 Herpesviral infection, unspecified: Secondary | ICD-10-CM

## 2023-09-06 DIAGNOSIS — F319 Bipolar disorder, unspecified: Secondary | ICD-10-CM

## 2023-09-06 DIAGNOSIS — F411 Generalized anxiety disorder: Secondary | ICD-10-CM

## 2023-09-06 NOTE — Progress Notes (Signed)
 THERAPIST PROGRESS NOTE Virtual Visit via Video Note  I connected with Kathryn [name] on 09/06/23 at  8:00 AM EDT by a video enabled telemedicine application and verified that I am speaking with the correct person using two identifiers.  Location: Patient: home address on file Provider:home office   I discussed the limitations of evaluation and management by telemedicine and the availability of in person appointments. The patient expressed understanding and agreed to proceed.  I discussed the assessment and treatment plan with the patient. The patient was provided an opportunity to ask questions and all were answered. The patient agreed with the plan and demonstrated an understanding of the instructions.   The patient was advised to call back or seek an in-person evaluation if the symptoms worsen or if the condition fails to improve as anticipated.  I provided 48 minutes of non-face-to-face time during this encounter.   Ty Bernice Savant, [facility]   Session Time: 8:04 am (   Participation Level: Active  Behavioral Response: CasualAlertWNL  Type of Therapy: Individual Therapy  Treatment Goals addressed:  STG: Kathryn [name] will increase overall wellness/ development of healthy habits AEB development of daily routine with improvement in physical health with reduced drinking and binge eating behaviors per self report within the next 90 days.     STG: Kathryn [name] will increase management of moods AEB development of effective coping skills with ability to explore and engage in enjoyable activities and self-care weekly withn the next 6 months  ProgressTowards Goals: Progressing  Interventions: Supportive  Summary: Kathryn [name] is a 28 y.o. female who presents with dx of bipolar d/o, PTSD, GAD and ADHD. Kathryn [name] presents alert and oriented; mood and affect adequate, stable.  Speech clear and coherent at normal rate and tone. Engaged and receptive to interventions. Shares for moods  have been better  and shares improvement in feelings of low mood and depression. Shares to feel as if work and signing up for too much as contributing factors to previous decline. Shares haas been able to work to accept current work situation for what it is and has pursued additional employment with another company she is hopeful for. Shares had decreased drinking to x 4 weekly and plans to reduce back down to x 2 or 3 this week. Shares increase in eating habits with decreased of take out/processed foods cooking more. Plans to attend gym next week following visit with family over the weekend. Discuss with therapist working to increase balance and routine with difficulty as she tends to live in extremes. 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 Shares would like to incorporate periods of down time. Explores with therapist factors to support increase balance in various domains of life. Denies safety concerns ongoing progress with goals.     Suicidal/Homicidal: Nowithout intent/plan  Therapist Response: Therapist engaged Kathryn [name] in [employer]. Confirmed current location and ability to hold confidential session. Therapist engaged Kathryn [name] in check in and assessed for current level of functioning sxs management. Active empathic listening providing support and encouragement. Provided safe space for Kathryn [name] to share thoughts of current increase in mood and daily life. Explored interactions with work and level of functioning. Explored ability to accept work as is and not giving more effect to the client than client as a family unit is willing to give. Encouraged working to ongoing decrease use of substances and increase in balance and routine. Reviewed session and provided follow up.   Plan: Return again in  x 5 weeks.  Diagnosis: Bipolar 1 disorder, depressed (HCC)  GAD (generalized anxiety disorder)  Collaboration of Care: Other None  Patient/Guardian was advised Release of Information must be  obtained prior to any record release in order to collaborate their care with an outside provider. Patient/Guardian was advised if they have not already done so to contact the registration department to sign all necessary forms in order for us  to release information regarding their care.   Consent: Patient/Guardian gives verbal consent for treatment and assignment of benefits for services provided during this visit. Patient/Guardian expressed understanding and agreed to proceed.   Ty Asal [place], [facility] 09/06/2023

## 2023-09-06 NOTE — Telephone Encounter (Signed)
 Copied from CRM [id number]. Topic: Clinical - Medication Refill >> Sep 06, 2023  1:41 PM Henretta I wrote: Medication:  valACYclovir  (VALTREX ) 1000 MG tablet    Has the patient contacted their pharmacy? Yes, pharm sent in script.  (Agent: If no, request that the patient contact the pharmacy for the refill. If patient does not wish to contact the pharmacy document the reason why and proceed with request.) (Agent: If yes, when and what did the pharmacy advise?)  This is the patient's preferred pharmacy:  [facility] [id number] - [place] [address] [place] [address] Phone: [phone] Fax: [phone]   Is this the correct pharmacy for this prescription? Yes If no, delete pharmacy and type the correct one.   Has the prescription been filled recently? No  Is the patient out of the medication? No, Is out starting tomorrow   Has the patient been seen for an appointment in the last year OR does the patient have an upcoming appointment? Yes  Can we respond through MyChart? Yes  Agent: Please be advised that Rx refills may take up to 3 business days. We ask that you follow-up with your pharmacy.

## 2023-09-08 NOTE — Telephone Encounter (Signed)
 Spoke w/ Pt- informed she would need to be seen to have Valtrex  refilled, last refilled by GYN in 2023. Appt scheduled tomorrow, 7/24 w/ Harlene GRADE.

## 2023-09-08 NOTE — Telephone Encounter (Signed)
 Chart reviewed, GYN office (Dr. Carson) prescribes this medication (last refilled in 2023 however). Pt will need to contact their office. She also needs an appt w/ PCP or Harlene GRADE.

## 2023-09-08 NOTE — Telephone Encounter (Signed)
 Primus Speaker A   09/08/2023 12:33 PM  Pt has called back to get a status update on her medication of valACYclovir  (VALTREX ) 1000 MG tablet           . She says she will be out tomorrow and without the medication she will have an outbreak. She says she is hoping this can be sent today. Please advise

## 2023-09-08 NOTE — Telephone Encounter (Signed)
 Error

## 2023-09-09 ENCOUNTER — Ambulatory Visit (INDEPENDENT_AMBULATORY_CARE_PROVIDER_SITE_OTHER): Payer: Self-pay | Admitting: Student

## 2023-09-09 ENCOUNTER — Encounter: Payer: Self-pay | Admitting: Student

## 2023-09-09 VITALS — BP 142/96 | HR 88 | Temp 98.2°F | Resp 12 | Ht 68.5 in | Wt 208.0 lb

## 2023-09-09 DIAGNOSIS — B009 Herpesviral infection, unspecified: Secondary | ICD-10-CM

## 2023-09-09 DIAGNOSIS — I10 Essential (primary) hypertension: Secondary | ICD-10-CM

## 2023-09-09 MED ORDER — VALACYCLOVIR HCL 1 G PO TABS: 1000.0000 mg | ORAL_TABLET | Freq: Every day | ORAL | 1 refills | Status: DC

## 2023-09-09 NOTE — Assessment & Plan Note (Signed)
 BP Readings from Last 3 Encounters:  09/09/23 (!) 148/100  10/28/22 118/84  07/29/22 130/89    BP elevated today.  She is currently not on medication.  Reports Hx whitecoat HTN  Encourage patient to monitor BP at home return to clinic if BP >140/90.

## 2023-09-09 NOTE — Progress Notes (Signed)
   Acute Office Visit  Subjective:     Patient ID: Kathryn [name], female    DOB: 05/18/1995, 28 y.o.   MRN: [id number]  Chief Complaint  Patient presents with   valtrex  refill    HPI Patient is in today for follow up.  Patient presents today for medication refill and general follow-up. She reports feeling well overall, with no new or acute concerns. She is currently in between insurance coverage and has been experiencing some difficulty navigating the transition, which has delayed her ability to schedule a routine annual physical and lab work. Despite this, she expresses interest in returning for preventive care once her insurance is active. She is requesting a refill of Valtrex  (valacyclovir ).. She reports no recent outbreaks and no adverse side effects from the medication. She remains stable on her current regimen.  Patient denies fever, chills, SOB, CP, palpitations, dyspnea, edema, HA, vision changes, N/V/D, abdominal pain, urinary symptoms, rash, weight changes, and recent illness or hospitalizations.      ROS  See HPI    Objective:    BP (!) 148/100 (BP Location: Right Arm, Patient Position: Sitting, Cuff Size: Normal)   Pulse 88   Temp 98.2 F (36.8 C) (Oral)   Resp 12   Ht 5' 8.5 (1.74 m)   Wt 208 lb (94.3 kg)   SpO2 98%   BMI 31.17 kg/m    Physical Exam  General: No acute distress. Awake and conversant.  Eyes: Normal conjunctiva, anicteric. Round symmetric pupils.  ENT: Hearing grossly intact. No nasal discharge.  Respiratory: CTAB. Respirations are non-labored. No wheezing.  Skin: Warm. No rashes or ulcers.  Psych: Alert and oriented. Cooperative, Appropriate mood and affect, Normal judgment.  CV: RRR. No murmur. No lower extremity edema.  MSK: Normal ambulation. No clubbing or cyanosis.  Neuro:  CN II-XII grossly normal.    No results found for any visits on 09/09/23.      Assessment & Plan:   Problem List Items Addressed This Visit     Essential  hypertension - Primary   BP Readings from Last 3 Encounters:  09/09/23 (!) 148/100  10/28/22 118/84  07/29/22 130/89    BP elevated today.  She is currently not on medication.  Reports Hx whitecoat HTN  Encourage patient to monitor BP at home return to clinic if BP >140/90.      HSV (herpes simplex virus) infection   Stable on current medication.       Relevant Medications   valACYclovir  (VALTREX ) 1000 MG tablet    Meds ordered this encounter  Medications   valACYclovir  (VALTREX ) 1000 MG tablet    Sig: Take 1 tablet (1,000 mg total) by mouth daily.    Dispense:  90 tablet    Refill:  1    Supervising Provider:   DOMENICA BLACKBIRD A [[id number]]    Return in about 3 months (around 12/10/2023), or CPE.  Erum Cercone L Rodrick Payson, NP

## 2023-09-09 NOTE — Assessment & Plan Note (Addendum)
 Stable on current medication.

## 2023-09-17 ENCOUNTER — Encounter (HOSPITAL_COMMUNITY): Payer: Self-pay | Admitting: Student in an Organized Health Care Education/Training Program

## 2023-09-17 ENCOUNTER — Ambulatory Visit (HOSPITAL_COMMUNITY): Admitting: Student in an Organized Health Care Education/Training Program

## 2023-09-17 DIAGNOSIS — F411 Generalized anxiety disorder: Secondary | ICD-10-CM | POA: Diagnosis not present

## 2023-09-17 DIAGNOSIS — F319 Bipolar disorder, unspecified: Secondary | ICD-10-CM | POA: Diagnosis not present

## 2023-09-17 MED ORDER — ONDANSETRON HCL 4 MG PO TABS: 4.0000 mg | ORAL_TABLET | Freq: Three times a day (TID) | ORAL | 0 refills | Status: AC | PRN

## 2023-09-17 MED ORDER — RISPERIDONE 1 MG PO TABS: 0.5000 mg | ORAL_TABLET | Freq: Every day | ORAL | 0 refills | Status: DC

## 2023-09-17 MED ORDER — LAMOTRIGINE 200 MG PO TABS: 200.0000 mg | ORAL_TABLET | Freq: Every day | ORAL | 2 refills | Status: DC

## 2023-09-17 MED ORDER — VENLAFAXINE HCL ER 75 MG PO CP24: ORAL_CAPSULE | ORAL | 2 refills | Status: DC

## 2023-09-17 NOTE — Progress Notes (Signed)
 BH MD Outpatient Progress Note  09/17/2023 11:32 AM Joelie Schou  MRN:  [id number]  Assessment:  Kathryn [name] presents for follow-up evaluation in-person on 09/17/23 .   Patient presents with elevated blood pressure and ongoing concerns related to mood, attention, and sleep. She requested an increase in Adderall, but this was deferred due to multiple factors likely contributing to her hypertension, including family history, nicotine use, regular alcohol intake, and excessive caffeine consumption (4-5 caffeinated sodas daily, often into the evening). Her poor sleep and daytime fatigue are likely compounded by these behaviors. Psychoeducation was provided, including written materials on caffeine reduction, and she was amenable to considering a switch to Vyvanse at a future visit. Given the hypertensive potential of her current medication (Effexor , Risperdal , and Adderall) we agreed to begin tapering Risperdal .  The working diagnosis of bipolar I disorder was re-evaluated; no clear episodes of mania or hypomania were identified. Instead, the patient describes affective instability and interpersonal sensitivity more suggestive of possible cluster B personality traits. She herself has questioned whether borderline personality disorder may be a better fit. Given the absence of manic symptoms, the risk of destabilization with antidepressants appears low.  PTSD diagnosis remains, but she currently denies distressing nightmares. She was previously prescribed prazosin  but did not initiate it and is not interested in restarting at this time.  Identifying Information: Kathryn [name] is a 28 y.o. female with a history of Bipolar Disorder, GAD, PTSD, and ADHD who is an established patient with [facility] for management of mood. She was formerly a patient of Dr. Raliegh. For a comprehensive history and detailed assessment, please refer to the initial adult assessment.  The patient's PMHx is  significant for GERD.   Plan:  # Bipolar I Disorder vs underlying Cluster B personality traits #PTSD #GAD Past medication trials: Remeron  due to AEs, Prozac (stopped due to futility), Zoloft  (stopped due to futility) Status of problem: new to me Interventions: -- Continue Effexor  XR 75 mg daily for depression and anxiety -- Plan to taper off and discontinue Risperdal  decreased from 1 mg to 0.5 mg nightly for 14 days then stop -- Continue Lamictal  200 mg QHS for mood stability -- Continue Zofran  4 mg q8 PRN -- Continue therapy with Bernice  # ADHD Past medication trials: strattera (stopped due to futility), ritalin  Status of problem: new to me Interventions: -- Continue Adderall 25 mg BID   #HTN - Encourage PCP follow-up - Multifactorial (family history of hypertension, ongoing alcohol and nicotine use, excess caffeine use,on several hypertensive medications, lifestyle)   Patient was given contact information for behavioral health clinic and was instructed to call 911 for emergencies.   Subjective:  Chief Complaint:  Chief Complaint  Patient presents with   Fatigue   Follow-up   ADHD    Interval History:  Patient reports continued improvement in depressive symptoms. Ongoing psychosocial stressors include high work demands and financial strain. She works as a [employer] manager and describes her job as physically and emotionally demanding. Approximately three weeks ago, she took two mental health days due to feelings of burnout.  She notes she has been coping better with the loss of her girlfriend, who passed away three years ago. No recent nightmares reported. She continues attending therapy monthly.  Patient states that Adderall does not feel effective, though she is adamant about not adjusting the dose at this time. She takes it at 7 AM and 11 AM but admits to taking breaks from the medication at  least twice per week. She acknowledges that lifestyle changes are  necessary, especially given her hypertension, which is likely being exacerbated by current habits.  Psychoeducation was provided on the impact of caffeine and alcohol on mood, anxiety, and blood pressure. Patient consumes 4-5 caffeinated sodas daily, often into the evening. Educational handouts on caffeine tapering were provided in the after-visit summary. She also reports drinking alcohol 3x/week (typically one beverage: vodka and coke or beer). She is not yet ready to quit smoking but uses a vape daily and snuff intermittently.  Sleep averages 4-7 hours per night, which she reports is baseline. Appetite remains unchanged, averaging 1-2 meals per day.  She denies suicidal ideation, medication side effects, or recent changes in psychiatric symptoms aside from the concerns mentioned above. Compliance with medications is generally good.   Visit Diagnosis:    ICD-10-CM   1. Bipolar 1 disorder, depressed (HCC)  F31.9 venlafaxine  XR (EFFEXOR -XR) 75 MG 24 hr capsule    risperiDONE  (RISPERDAL ) 1 MG tablet    lamoTRIgine  (LAMICTAL ) 200 MG tablet    2. GAD (generalized anxiety disorder)  F41.1 venlafaxine  XR (EFFEXOR -XR) 75 MG 24 hr capsule    3. 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, registered Development worker, community Social support: best friends and best friend's family, deceased gf's family Maritas status: Single Legal: Denies Firearms: Denies   Social History   Socioeconomic History   Marital status: Single     Spouse name: Not on file   Number of children: 0   Years of education: 12   Highest education level: Not on file  Occupational History    Comment: [employer]  Tobacco Use   Smoking status: Every Day    Types: E-cigarettes   Smokeless tobacco: Current    Types: Snuff  Vaping Use   Vaping status: Never Used  Substance and Sexual Activity   Alcohol use: Yes    Comment: maybe every 6 months   Drug use: Yes    Types: Marijuana    Comment: daily   Sexual activity: Yes    Partners: Male    Birth control/protection: Pill  Other Topics Concern   Not on file  Social History Narrative   Not on file   Social Drivers of Health   Financial Resource Strain: Medium Risk (01/16/2022)   Overall Financial Resource Strain (CARDIA)    Difficulty of Paying Living Expenses: Somewhat hard  Food Insecurity: No Food Insecurity (01/16/2022)   Hunger Vital Sign    Worried About Running Out of Food in the Last Year: Never true    Ran Out of Food in the Last Year: Never true  Transportation Needs: No Transportation Needs (01/16/2022)   PRAPARE - Administrator, Civil Service (Medical): No    Lack of Transportation (Non-Medical): No  Physical Activity: Inactive (01/16/2022)   Exercise Vital Sign    Days of Exercise per Week: 0 days    Minutes of Exercise per Session: 0 min  Stress: No Stress Concern Present (01/16/2022)   [employer] of Occupational  Health - Occupational Stress Questionnaire    Feeling of Stress : Only a little  Social Connections: Socially Isolated (01/16/2022)   Social Connection and Isolation Panel    Frequency of Communication with Friends and Family: More than three times a week    Frequency of Social Gatherings with Friends and Family: Never    Attends Religious Services: Never    Database administrator or Organizations: No    Attends Banker Meetings: Never    Marital Status: Never married    Allergies:  Allergies  Allergen Reactions    Oxycodone  Nausea Only, Nausea And Vomiting and Other (See Comments)    Oxycontin  also Oxycontin  also Oxycontin  also Oxycontin  also    Quetiapine  Fumarate Other (See Comments)    Mental status changes Mental status changes Mental status changes Mental status changes     Current Medications: Current Outpatient Medications  Medication Sig Dispense Refill   amphetamine -dextroamphetamine  (ADDERALL) 12.5 MG tablet Take 2 tablets by mouth 2 (two) times daily. 120 tablet 0   amphetamine -dextroamphetamine  (ADDERALL) 12.5 MG tablet Take 2 tablets by mouth 2 (two) times daily. 120 tablet 0   amphetamine -dextroamphetamine  (ADDERALL) 12.5 MG tablet Take 2 tablets by mouth 2 (two) times daily. 120 tablet 0   Levonorgestrel-Ethinyl Estradiol (AMETHIA) 0.15-0.03 &0.01 MG tablet Take 1 tablet by mouth daily. 91 tablet 4   omeprazole  (PRILOSEC) 40 MG capsule Take 1 capsule (40 mg total) by mouth 2 (two) times daily. 180 capsule 3   valACYclovir  (VALTREX ) 1000 MG tablet Take 1 tablet (1,000 mg total) by mouth daily. 90 tablet 1   lamoTRIgine  (LAMICTAL ) 200 MG tablet Take 1 tablet (200 mg total) by mouth at bedtime. Take one a day 30 tablet 2   ondansetron  (ZOFRAN ) 4 MG tablet Take 1 tablet (4 mg total) by mouth every 8 (eight) hours as needed for nausea or vomiting. 10 tablet 0   risperiDONE  (RISPERDAL ) 1 MG tablet Take 0.5 tablets (0.5 mg total) by mouth at bedtime. 14 tablet 0   venlafaxine  XR (EFFEXOR -XR) 75 MG 24 hr capsule TAKE ONE CAPSULE BY MOUTH DAILY  WITH BREAKFAST 30 capsule 2   No current facility-administered medications for this visit.    ROS: Review of Systems  All other systems reviewed and are negative.   Objective:  Objective: Psychiatric Specialty Exam: General Appearance: Casual, fairly groomed  Eye Contact:  Good    Speech:  Clear, coherent, normal rate, spontaneous  Volume:  Normal   Mood:  see above  Affect:  Appropriate, congruent, full range  Thought Content:  Logical  Suicidal Thoughts: see subjective  Thought Process:  Coherent, goal-directe  Orientation:  A&Ox4   Memory:  Immediate good  Judgment:  Fair   Insight:  Fair  Concentration:  Attention and concentration good   Recall:  Good  Fund of Knowledge: Good  Language: Good, fluent  Psychomotor Activity: Normal  Akathisia:  NA   AIMS (if indicated): NA   Assets:   Communication Skills Desire for Improvement Housing Resilience Social Support Talents/Skills Vocational/Educational  ADL's:  Intact  Cognition: WNL  Sleep: see above  Appetite: see above    Physical Exam Vitals reviewed.  Constitutional:      Appearance: Normal appearance.  Eyes:     Extraocular Movements: Extraocular movements intact.     Conjunctiva/sclera: Conjunctivae normal.  Pulmonary:     Effort: Pulmonary effort is normal. No respiratory distress.  Musculoskeletal:        General: Normal range  of motion.  Skin:    General: Skin is warm and dry.  Neurological:     General: No focal deficit present.     Mental Status: She is alert.      Metabolic Disorder Labs: Lab Results  Component Value Date   HGBA1C 5.2 11/17/2018   No results found for: PROLACTIN Lab Results  Component Value Date   CHOL 222 (H) 06/18/2021   TRIG 72.0 06/18/2021   HDL 56.90 06/18/2021   CHOLHDL 4 06/18/2021   VLDL 14.4 06/18/2021   LDLCALC 150 (H) 06/18/2021   LDLCALC 166 (H) 11/17/2018   Lab Results  Component Value Date   TSH 0.735 11/17/2018   TSH 2.49 05/05/2017    Therapeutic Level Labs: No results found for: LITHIUM No results found for: VALPROATE No results found for: CBMZ  Screenings:  AUDIT    Flowsheet Row Counselor from 01/16/2022 in [facility]  Alcohol Use Disorder Identification Test Final Score (AUDIT) 8   GAD-7    Flowsheet Row Clinical Support from 09/17/2023 in [facility] Counselor from 02/15/2023 in [facility] Office Visit from 04/24/2022 in [facility] Counselor from 01/16/2022 in [facility] Video Visit from 05/27/2021 in [facility]  Total GAD-7 Score 4 8 0 7 3   PHQ2-9    Flowsheet Row Clinical Support from 09/17/2023 in [facility] Counselor from 02/15/2023 in [facility] Office Visit from 04/24/2022 in [facility] Counselor from 01/16/2022 in [facility] Video Visit from 05/27/2021 in [facility]  PHQ-2 Total Score 3 4 0 5 3  PHQ-9 Total Score 8 15 2 14 6    Flowsheet Row Counselor from 01/16/2022 in [facility] Video Visit from 05/27/2021 in [facility]  C-SSRS RISK CATEGORY Low Risk No Risk      [name], MD 09/17/2023, 11:32 AM

## 2023-09-17 NOTE — Patient Instructions (Addendum)
 Recommendations for Reducing Caffeine Intake  Gradual Reduction: Start by reducing your daily caffeine intake gradually. For example, decrease your usual coffee intake by half a cup every 5 days to avoid withdrawal symptoms such as headaches or irritability.  Alternate Beverages: Replace caffeinated drinks with decaffeinated options, herbal teas, or water. Try alternatives like decaf coffee, herbal tea, or sparkling water with lemon to help transition.  Track Your Intake: Keep a log of your daily caffeine consumption to become more aware of your habits. This includes tracking all sources, such as coffee, tea, soda, energy drinks, and even chocolate.  Read Labels: Be mindful of hidden caffeine in foods and over-the-counter medications, such as headache relievers or cold medications, which can contain unexpected caffeine.  Establish a Cutoff Time: To improve sleep, set a daily caffeine cutoff time of 12 PM. Since caffeine can stay in your system for 6-8 hours. Start by cutting off any coffee consumption that is closest to bed time.  Hydration: Increase your water intake as you decrease caffeine. Proper hydration can improve energy levels and reduce caffeine cravings.  Listen to Your Body: Pay attention to how your body feels during the reduction process. Notice improvements in your sleep, mood, and anxiety levels, which can motivate you to continue.  Exercise and Stress Management: Regular physical activity can help manage caffeine cravings. Consider integrating stress management techniques like mindfulness or deep breathing to avoid relying on caffeine for an energy boost.  Caffeine Taper Withdraw half a unit (unit can be considered the serving unit ie cup, can, mug of beverage) every 5 days to avoid caffeine headaches. If headaches recur, take sips of caffeine but refrain from returning to previous amount.

## 2023-10-14 ENCOUNTER — Telehealth (HOSPITAL_COMMUNITY): Payer: Self-pay

## 2023-10-14 ENCOUNTER — Other Ambulatory Visit (HOSPITAL_COMMUNITY): Payer: Self-pay | Admitting: Psychiatry

## 2023-10-14 DIAGNOSIS — F900 Attention-deficit hyperactivity disorder, predominantly inattentive type: Secondary | ICD-10-CM

## 2023-10-14 MED ORDER — AMPHETAMINE-DEXTROAMPHETAMINE 12.5 MG PO TABS: 25.0000 mg | ORAL_TABLET | Freq: Two times a day (BID) | ORAL | 0 refills | Status: DC

## 2023-10-14 NOTE — Telephone Encounter (Signed)
Patient is requesting refill of Adderall.

## 2023-10-20 ENCOUNTER — Ambulatory Visit (HOSPITAL_COMMUNITY): Admitting: Mental Health

## 2023-10-20 DIAGNOSIS — F319 Bipolar disorder, unspecified: Secondary | ICD-10-CM

## 2023-10-20 DIAGNOSIS — F411 Generalized anxiety disorder: Secondary | ICD-10-CM

## 2023-10-20 DIAGNOSIS — F900 Attention-deficit hyperactivity disorder, predominantly inattentive type: Secondary | ICD-10-CM

## 2023-10-20 NOTE — Progress Notes (Unsigned)
 THERAPIST PROGRESS NOTE Virtual Visit via Video Note  I connected with Kathryn [name] on 10/20/23 at  8:00 AM EDT by a video enabled telemedicine application and verified that I am speaking with the correct person using two identifiers.  Location: Patient: home address on file Provider: office   I discussed the limitations of evaluation and management by telemedicine and the availability of in person appointments. The patient expressed understanding and agreed to proceed.  I discussed the assessment and treatment plan with the patient. The patient was provided an opportunity to ask questions and all were answered. The patient agreed with the plan and demonstrated an understanding of the instructions.   The patient was advised to call back or seek an in-person evaluation if the symptoms worsen or if the condition fails to improve as anticipated.  I provided 55 minutes of non-face-to-face time during this encounter.   Kathryn [name], [facility]   Session Time: 8:04 am ( 55 minutes)  Participation Level: Active  Behavioral Response: CasualAlertEuthymic  Type of Therapy: Individual Therapy  Treatment Goals addressed: STG: Kathryn [name] will increase overall wellness/ development of healthy habits AEB development of daily routine with improvement in physical health with reduced drinking and binge eating behaviors per self report within the next 90 days.     STG: Kathryn [name] will increase management of moods AEB development of effective coping skills with ability to explore and engage in enjoyable activities and self-care weekly withn the next 6 months  ProgressTowards Goals: Progressing  Interventions: CBT and Supportive  Summary:Kathryn [name] is a 28 y.o. female who presents with dx of bipolar d/o, PTSD, GAD and ADHD. Kathryn [name] presents alert and oriented; mood and affect adequate, stable.  Speech clear and coherent at normal rate and tone. Engaged and receptive to interventions. Shares for moods   have been stable and denies elevated or depressed moods. Notes episodes of stress but was able to contact natural supports for support. Notes high blood pressure and need for medications to be adjusted and shares thoughts of hesitancy for this. Shares increase in irritable moods with cessation of one medications and explores with therapist exploration of use of coping skills as well as tracking irritable moods for presence of severity and frequency and triggers. Shares engagement in enjoyable activities and self -care and working to re-establish attendance to routine, physical health and reduction in drinking behaviors.  Shares thoughts of interaction with new relationship and shared dynamics and thoughts on being a healthy relationship for her and work to take her time with relationship. Progress with goals. No safety concerns reported.    Suicidal/Homicidal: Nowithout intent/plan  Therapist Response: Therapist engaged Kathryn [name] in [employer]. Confirmed current location and ability to hold confidential session. Therapist engaged Kathryn [name] in check in and assessed for current level of functioning sxs management. Active empathic listening providing support and encouragement. Provided safe place to share thoughts and concerns. Supported in processing concerns for medication changes and encouraged to identify triggers, frequency and severity of irritable moods and track to explore ability to cope with moods as well as address concerns with provider. Explored ability to attend to healthy routine and behavioral changes as well as diet changes provider presence of high blood pressure. Supported in processing current relationship and identification of healthy vs. Unhealthy relationship dynamics. Reviewed session and provided follow up,   Plan: Return again in  x 2 weeks.  Diagnosis: Bipolar 1 disorder, depressed (HCC)  Attention deficit hyperactivity disorder (ADHD), predominantly inattentive type  GAD  (  generalized anxiety disorder)  Collaboration of Care: Other None  Patient/Guardian was advised Release of Information must be obtained prior to any record release in order to collaborate their care with an outside provider. Patient/Guardian was advised if they have not already done so to contact the registration department to sign all necessary forms in order for us  to release information regarding their care.   Consent: Patient/Guardian gives verbal consent for treatment and assignment of benefits for services provided during this visit. Patient/Guardian expressed understanding and agreed to proceed.   Kathryn [name], [facility] 10/20/2023

## 2023-10-29 ENCOUNTER — Encounter (HOSPITAL_COMMUNITY): Admitting: Student in an Organized Health Care Education/Training Program

## 2023-11-01 ENCOUNTER — Other Ambulatory Visit (HOSPITAL_COMMUNITY): Payer: Self-pay

## 2023-11-01 NOTE — Telephone Encounter (Signed)
 Adderall request  Due to cancelled appointment

## 2023-11-02 ENCOUNTER — Telehealth (HOSPITAL_COMMUNITY): Payer: Self-pay | Admitting: *Deleted

## 2023-11-02 DIAGNOSIS — F411 Generalized anxiety disorder: Secondary | ICD-10-CM

## 2023-11-02 DIAGNOSIS — F900 Attention-deficit hyperactivity disorder, predominantly inattentive type: Secondary | ICD-10-CM

## 2023-11-02 DIAGNOSIS — F319 Bipolar disorder, unspecified: Secondary | ICD-10-CM

## 2023-11-02 MED ORDER — VENLAFAXINE HCL ER 75 MG PO CP24: 75.0000 mg | ORAL_CAPSULE | Freq: Every day | ORAL | 0 refills | Status: DC

## 2023-11-02 MED ORDER — LAMOTRIGINE 200 MG PO TABS
200.0000 mg | ORAL_TABLET | Freq: Every day | ORAL | 0 refills | Status: DC
Start: 2023-11-02 — End: 2023-12-09

## 2023-11-02 MED ORDER — AMPHETAMINE-DEXTROAMPHETAMINE 12.5 MG PO TABS: 25.0000 mg | ORAL_TABLET | Freq: Two times a day (BID) | ORAL | 0 refills | Status: DC

## 2023-11-02 NOTE — Telephone Encounter (Signed)
 Pt called [facility] office requesting a refill of Adderall 12.5 mg BID. Pt had to reschedule appointment for today. Next visit scheduled for 12/09/23. Please review.

## 2023-11-02 NOTE — Telephone Encounter (Signed)
 Telephone Encounter Note  Reason for Call: Patient called requesting a refill of prescribed medications to bridge until next appointment. Spoke with patient at [phone]  PDMP reviewed: appropriate filling of medications. Chart reviewed: Patient is established and has a history of appropriate use. Last appointment on 09/17/2023; next appointment scheduled for 12/09/2023 .  Plan: Refill for Adderall 25 mg BID sent to pharmacy to bridge until next scheduled appointment. Other medication refills sent : Lamictal  200 mg, 90 days, 0 refills Effexor  75 mg daily, 90 days, 0 refills Patient advised to keep upcoming appointment for ongoing management and monitoring.  Ayme Short Carrin Carrero, MD PGY-3, [facility]

## 2023-11-08 ENCOUNTER — Ambulatory Visit (INDEPENDENT_AMBULATORY_CARE_PROVIDER_SITE_OTHER): Admitting: Mental Health

## 2023-11-08 DIAGNOSIS — F319 Bipolar disorder, unspecified: Secondary | ICD-10-CM

## 2023-11-08 DIAGNOSIS — F411 Generalized anxiety disorder: Secondary | ICD-10-CM

## 2023-11-08 NOTE — Progress Notes (Signed)
 THERAPIST PROGRESS NOTE Virtual Visit via Video Note  I connected with Kathryn [name] on 11/08/23 at  8:00 AM EDT by a video enabled telemedicine application and verified that I am speaking with the correct person using two identifiers.  Location: Patient: home address on file Provider: remote office   I discussed the limitations of evaluation and management by telemedicine and the availability of in person appointments. The patient expressed understanding and agreed to proceed.  I discussed the assessment and treatment plan with the patient. The patient was provided an opportunity to ask questions and all were answered. The patient agreed with the plan and demonstrated an understanding of the instructions.   The patient was advised to call back or seek an in-person evaluation if the symptoms worsen or if the condition fails to improve as anticipated.  I provided 54 minutes of non-face-to-face time during this encounter.   Kathryn [name], [facility]   Session Time: 8:03 am (  Participation Level: Active  Behavioral Response: CasualAlertWNL  Type of Therapy: Individual Therapy  Treatment Goals addressed: STG: Kathryn [name] will increase overall wellness/ development of healthy habits AEB development of daily routine with improvement in physical health with reduced drinking and binge eating behaviors per self report within the next 90 days.     STG: Kathryn [name] will increase management of moods AEB development of effective coping skills with ability to explore and engage in enjoyable activities and self-care weekly withn the next 6 months  ProgressTowards Goals: Progressing- regressed  Interventions: CBT and Supportive  Summary: Kathryn [name] is a 28 y.o. female who presents with dx of bipolar d/o, PTSD, GAD and ADHD. Kathryn [name] presents alert and oriented; mood and affect adequate, stable.  Speech clear and coherent at normal rate and tone. Engaged. Shares thoughts of ending relationship with  individual she had been seeing and shares factors that have contributed to this decision. Shares thoughts on having BPD vs. Having bipolar disorder after speaking with others. Notes thoughts on difficulty maintain relationships, abandonment issues and episodes of feelings of numbness. Shares presence of 'zoning out' and not fully processing what others are telling her in discussions. Reports feelings of living due to how others would feel if she was to pass. Notes current concerns for no joy in life and not engaging in things in which she enjoys and denies desire to do things alone, reporting over thinking. Agrees to explore referral to DBT group. Denies safety concerns. States lacking attendance to routine and self-care with regression in goals.   Suicidal/Homicidal: Nowithout intent/plan  Therapist Response: Therapist engaged Kathryn [name] in [employer]. Confirmed current location and ability to hold confidential session. Therapist engaged Kathryn [name] in check in and assessed for current level of functioning sxs management. Active empathic listening providing support and encouragement. Provided safe place to share thoughts and concerns. Supported in processing thoughts on holding of borderline personality and reviewed sxs of borderline vs. Sxs of bipolar disorder. Explored patterns in mood instability and exploration of presence of low moods. Explored feelings of joy and ability to engage with things she likes with hx of reports of enjoyable activities and desire for life. Assessed for safety. Explored factor of ending relationship and other life stressors to be triggering feelings of low mood and presentation. Discussed referral to DBT group. Reviewed session and provided follow up  Plan: Return again in  x 3 weeks.  Diagnosis: Bipolar 1 disorder, depressed (HCC)  GAD (generalized anxiety disorder)  Collaboration of Care: Other None  Patient/Guardian was  advised Release of Information must be obtained  prior to any record release in order to collaborate their care with an outside provider. Patient/Guardian was advised if they have not already done so to contact the registration department to sign all necessary forms in order for us  to release information regarding their care.   Consent: Patient/Guardian gives verbal consent for treatment and assignment of benefits for services provided during this visit. Patient/Guardian expressed understanding and agreed to proceed.   Kathryn [name], [facility] 11/08/2023

## 2023-12-02 ENCOUNTER — Other Ambulatory Visit (HOSPITAL_COMMUNITY): Payer: Self-pay | Admitting: Psychiatry

## 2023-12-02 ENCOUNTER — Telehealth (HOSPITAL_COMMUNITY): Payer: Self-pay

## 2023-12-02 DIAGNOSIS — F900 Attention-deficit hyperactivity disorder, predominantly inattentive type: Secondary | ICD-10-CM

## 2023-12-02 MED ORDER — AMPHETAMINE-DEXTROAMPHETAMINE 12.5 MG PO TABS
25.0000 mg | ORAL_TABLET | Freq: Two times a day (BID) | ORAL | 0 refills | Status: DC
Start: 2023-12-02 — End: 2023-12-09

## 2023-12-02 NOTE — Telephone Encounter (Signed)
 Patient request refill of adderall 12.5 mg. Patient has appointment on 12/09/23. Is bridge until next appointment possible?

## 2023-12-03 ENCOUNTER — Ambulatory Visit (INDEPENDENT_AMBULATORY_CARE_PROVIDER_SITE_OTHER): Admitting: Mental Health

## 2023-12-03 DIAGNOSIS — F319 Bipolar disorder, unspecified: Secondary | ICD-10-CM | POA: Diagnosis not present

## 2023-12-03 NOTE — Progress Notes (Signed)
 THERAPIST PROGRESS NOTE Virtual Visit via Video Note  I connected with Kathryn [name] on 12/03/23 at  8:00 AM EDT by a video enabled telemedicine application and verified that I am speaking with the correct person using two identifiers.  Location: Patient: home address on file Provider: remote office   I discussed the limitations of evaluation and management by telemedicine and the availability of in person appointments. The patient expressed understanding and agreed to proceed.  I discussed the assessment and treatment plan with the patient. The patient was provided an opportunity to ask questions and all were answered. The patient agreed with the plan and demonstrated an understanding of the instructions.   The patient was advised to call back or seek an in-person evaluation if the symptoms worsen or if the condition fails to improve as anticipated.  I provided 54 minutes of non-face-to-face time during this encounter.   Ty Bernice Savant, [facility]   Session Time: 8:04 am   Participation Level: Active  Behavioral Response: CasualAlertWNL  Type of Therapy: Individual Therapy  Treatment Goals addressed: : STG: Zafira will increase overall wellness/ development of healthy habits AEB development of daily routine with improvement in physical health with reduced drinking and binge eating behaviors per self report within the next 90 days.     STG: Marna will increase management of moods AEB development of effective coping skills with ability to explore and engage in enjoyable activities and self-care weekly withn the next 6 months  ProgressTowards Goals: Progressing  Interventions: CBT and Supportive  Summary: Kathryn [name] is a 28 y.o. female who presents with dx of bipolar d/o, PTSD, GAD and ADHD. Kaleigha presents alert and oriented; mood and affect adequate, stable.  Speech clear and coherent at normal rate and tone. Engaged. Shares chief complaint of exhausted notes difficulty  with sleep and able to identify factors contributing to difficulty sleeping. Notes need focus on reestablishment of healthy routine and habits. Shares event in which blood pressure was greatly increased, noting to have been checking. Shares awareness of effects alcohol can have on blood pressure. Notes increased binging and poor eating habits with eating out. Shares thoughts on upcoming new employment and concerns for low confidence. Engages with therapist in review of ability to engage in skills to support in managing anxiety of new position and re-establishing routine. Ongoing work towards goals. Denies safety concerns.   Suicidal/Homicidal: Nowithout intent/plan  Therapist Response:  Therapist engaged [place]on in [employer]. Confirmed current location and ability to hold confidential session. Therapist engaged Pattiann in check in and assessed for current level of functioning sxs management. Active empathic listening providing support and encouragement. Provided safe place to share thoughts and concerns. Engaged in psycho-education on re-establishment of healthy routine and ways in which this can effect mental health and medical healht. Explored use of skills to reduce feelings of anxiety and encouraged attendance to positive affirmations to support in increasing confidence. Educated on PMR and skills to support in relaxation and calm before bed to increase sleep quality. Reviewed session and provided follow up  Plan: Return again in  x 4 weeks.  Diagnosis: Bipolar 1 disorder, depressed (HCC)  Collaboration of Care: Other None  Patient/Guardian was advised Release of Information must be obtained prior to any record release in order to collaborate their care with an outside provider. Patient/Guardian was advised if they have not already done so to contact the registration department to sign all necessary forms in order for us  to release information regarding their  care.   Consent:  Patient/Guardian gives verbal consent for treatment and assignment of benefits for services provided during this visit. Patient/Guardian expressed understanding and agreed to proceed.   Ty Asal Mayfield, [facility] 12/03/2023

## 2023-12-09 ENCOUNTER — Encounter (HOSPITAL_COMMUNITY): Payer: Self-pay | Admitting: Student in an Organized Health Care Education/Training Program

## 2023-12-09 ENCOUNTER — Ambulatory Visit (HOSPITAL_COMMUNITY): Admitting: Student in an Organized Health Care Education/Training Program

## 2023-12-09 DIAGNOSIS — F411 Generalized anxiety disorder: Secondary | ICD-10-CM | POA: Diagnosis not present

## 2023-12-09 DIAGNOSIS — F900 Attention-deficit hyperactivity disorder, predominantly inattentive type: Secondary | ICD-10-CM

## 2023-12-09 DIAGNOSIS — F319 Bipolar disorder, unspecified: Secondary | ICD-10-CM

## 2023-12-09 MED ORDER — VENLAFAXINE HCL ER 75 MG PO CP24: 75.0000 mg | ORAL_CAPSULE | Freq: Every day | ORAL | 0 refills | Status: DC

## 2023-12-09 MED ORDER — AMPHETAMINE-DEXTROAMPHETAMINE 12.5 MG PO TABS: 25.0000 mg | ORAL_TABLET | Freq: Two times a day (BID) | ORAL | 0 refills | Status: DC

## 2023-12-09 MED ORDER — LAMOTRIGINE 200 MG PO TABS: 200.0000 mg | ORAL_TABLET | Freq: Every day | ORAL | 2 refills | Status: DC

## 2023-12-09 NOTE — Telephone Encounter (Signed)
 This encounter was created in error - please disregard.

## 2023-12-09 NOTE — Progress Notes (Signed)
 BH MD Outpatient Progress Note  12/09/2023 5:44 PM Kathryn [name]  MRN:  [id number]  Assessment:  Kathryn [name] presents for follow-up evaluation on 12/09/23 .   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 She reports no depressive symptoms, minimal situational anxiety addressed in ongoing therapy.   Hypertension remains a concern. At the last visit, the decision was made to defer increasing Adderall due to persistent elevated blood pressure and multiple contributing factors, including family history, daily nicotine use, regular alcohol intake, and high caffeine consumption. Since then, she has begun to reduce evening caffeine intake and is considering a transition to non-nicotine vaping, but daily nicotine use and moderate alcohol consumption persist. She remains in the contemplation stage regarding nicotine cessation and is actively working on caffeine reduction. Alcohol use is stable at one to two drinks, twice weekly, with no escalation.  The decision to avoid increasing stimulant dose remains appropriate due to hypertensive risk.  Identifying Information: Kathryn [name] is a 28 y.o. female with a history of Bipolar Disorder, GAD, PTSD, and ADHD who is an established patient with [facility] for management of mood. She was formerly a patient of Dr. Raliegh. For a comprehensive history and detailed assessment, please refer to the initial adult assessment.  The patient's PMHx is significant for GERD.   Plan:  # Bipolar I Disorder vs underlying Cluster B personality traits #PTSD #GAD Past medication trials: Remeron  due to AEs, Prozac (stopped due to futility), Zoloft  (stopped due to futility), Risperdal  Status of problem: new to me Interventions: -- Continue Effexor  XR 75 mg daily for depression and anxiety --  Continue Lamictal  200 mg QHS for mood stability -- Continue Zofran  4 mg q8 PRN -- Continue therapy with Bernice  # ADHD Past medication trials: strattera (stopped due to futility), ritalin  Status of problem: new to me Interventions: -- Continue Adderall 25 mg BID   #HTN - Encourage PCP follow-up - Multifactorial (family history of hypertension, ongoing alcohol and nicotine use, excess caffeine use,on several hypertensive medications, lifestyle)  # Tobacco use disorder (vape) Prior tiral of chantix  -- Declined NRT --Smoking cessation encouraged  Patient was given contact information for behavioral health clinic and was instructed to call 911 for emergencies.   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 She reports that with her new position, she will regain insurance coverage and plans to reestablish care with her primary care provider. She describes feeling more financially secure as a result of this change.  She reports her mood has been good and states she has been doing well overall. She denies symptoms of depression. She reports minimal anxiety, which increases during social events, particularly with difficulties making eye contact. She states she has been addressing this in therapy with Bernice. She denies suicidal or homicidal ideation, and denies auditory or visual hallucinations.  She reports no issues discontinuing risperidone  and does not describe any adverse effects from medications. She reports taking her medications as directed without missing doses.  She describes her sleep and appetite as stable. She reports drinking Coke Zero in the evenings and is working on limiting her caffeine intake. She reports drinking alcohol two times  per week, typically consuming one  to two drinks, and no more than three at a time. She reports daily vaping, unchanged from prior, and plans to switch to non-nicotine vapes.   Visit Diagnosis:    ICD-10-CM   1. Bipolar 1 disorder, depressed (HCC)  F31.9 lamoTRIgine  (LAMICTAL ) 200 MG tablet    venlafaxine  XR (EFFEXOR -XR) 75 MG 24 hr capsule    2. Attention deficit hyperactivity disorder (ADHD), predominantly inattentive type  F90.0 amphetamine -dextroamphetamine  (ADDERALL) 12.5 MG tablet    amphetamine -dextroamphetamine  (ADDERALL) 12.5 MG tablet    3. 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, registered Development worker, community Social support: best friends and best friend's family, deceased gf's family Maritas status: Single Legal: Denies Firearms: Denies   Social History   Socioeconomic History   Marital status: Single    Spouse name: Not on file   Number of children: 0   Years of education: 12   Highest education level: Not on file  Occupational History    Comment: [employer]  Tobacco Use   Smoking status: Every Day    Types: E-cigarettes   Smokeless tobacco: Current    Types: Snuff  Vaping Use   Vaping status: Never Used  Substance and Sexual Activity   Alcohol use: Yes    Comment: maybe every 6 months   Drug use: Yes    Types: Marijuana    Comment: daily   Sexual activity: Yes    Partners: Male    Birth control/protection: Pill  Other Topics Concern   Not on file  Social History  Narrative   Not on file   Social Drivers of Health   Financial Resource Strain: Medium Risk (01/16/2022)   Overall Financial Resource Strain (CARDIA)    Difficulty of Paying Living Expenses: Somewhat hard  Food Insecurity: No Food Insecurity (01/16/2022)   Hunger Vital Sign    Worried About Running Out of Food in the Last Year: Never true    Ran Out of Food in the Last Year: Never true  Transportation Needs: No Transportation Needs (01/16/2022)   PRAPARE - Administrator, Civil Service (Medical): No    Lack of Transportation (Non-Medical): No  Physical Activity: Inactive (01/16/2022)   Exercise Vital Sign    Days of Exercise per Week: 0 days    Minutes of Exercise per Session: 0 min  Stress: No Stress Concern Present (01/16/2022)   [employer] of Occupational Health - Occupational Stress Questionnaire    Feeling of Stress : Only a little  Social Connections: Socially Isolated (01/16/2022)   Social Connection and Isolation Panel    Frequency of Communication with Friends and Family: More than three times a week    Frequency of Social Gatherings with Friends and Family: Never    Attends Religious Services: Never    Database administrator or Organizations: No    Attends Banker Meetings: Never    Marital Status: Never married    Allergies:  Allergies  Allergen Reactions  Oxycodone  Nausea Only, Nausea And Vomiting and Other (See Comments)    Oxycontin  also Oxycontin  also Oxycontin  also Oxycontin  also    Quetiapine  Fumarate Other (See Comments)    Mental status changes Mental status changes Mental status changes Mental status changes     Current Medications: Current Outpatient Medications  Medication Sig Dispense Refill   amphetamine -dextroamphetamine  (ADDERALL) 12.5 MG tablet Take 2 tablets by mouth 2 (two) times daily. 120 tablet 0   [START ON 01/05/2024] amphetamine -dextroamphetamine  (ADDERALL) 12.5 MG tablet Take 2 tablets by mouth 2 (two)  times daily. 120 tablet 0   lamoTRIgine  (LAMICTAL ) 200 MG tablet Take 1 tablet (200 mg total) by mouth at bedtime. Take one a day 90 tablet 2   Levonorgestrel-Ethinyl Estradiol (AMETHIA) 0.15-0.03 &0.01 MG tablet Take 1 tablet by mouth daily. 91 tablet 4   omeprazole  (PRILOSEC) 40 MG capsule Take 1 capsule (40 mg total) by mouth 2 (two) times daily. 180 capsule 3   ondansetron  (ZOFRAN ) 4 MG tablet Take 1 tablet (4 mg total) by mouth every 8 (eight) hours as needed for nausea or vomiting. 10 tablet 0   valACYclovir  (VALTREX ) 1000 MG tablet Take 1 tablet (1,000 mg total) by mouth daily. 90 tablet 1   venlafaxine  XR (EFFEXOR -XR) 75 MG 24 hr capsule Take 1 capsule (75 mg total) by mouth daily with breakfast. TAKE ONE CAPSULE BY MOUTH DAILY  WITH BREAKFAST 90 capsule 0   No current facility-administered medications for this visit.    ROS: Review of Systems  All other systems reviewed and are negative.   Objective:  Objective: Psychiatric Specialty Exam: General Appearance: Casual, fairly groomed  Eye Contact:  Good    Speech:  Clear, coherent, normal rate, spontaneous  Volume:  Normal   Mood:  see above  Affect:  Appropriate, congruent, full range  Thought Content: Logical  Suicidal Thoughts: see subjective  Thought Process:  Coherent, goal-directe  Orientation:  A&Ox4   Memory:  Immediate good  Judgment:  Fair   Insight:  Partial  Concentration:  Attention and concentration good   Recall:  Good  Fund of Knowledge: Good  Language: Good, fluent  Psychomotor Activity: Normal  Akathisia:  NA   AIMS (if indicated): NA   Assets:   Communication Skills Desire for Improvement Housing Resilience Social Support Talents/Skills Vocational/Educational  ADL's:  Intact  Cognition: WNL  Sleep: see above  Appetite: see above    Physical Exam Vitals reviewed.  Constitutional:      Appearance: Normal appearance.  Eyes:     Extraocular Movements: Extraocular movements intact.      Conjunctiva/sclera: Conjunctivae normal.  Pulmonary:     Effort: Pulmonary effort is normal. No respiratory distress.  Musculoskeletal:        General: Normal range of motion.  Skin:    General: Skin is warm and dry.  Neurological:     General: No focal deficit present.     Mental Status: She is alert.      Metabolic Disorder Labs: Lab Results  Component Value Date   HGBA1C 5.2 11/17/2018   No results found for: PROLACTIN Lab Results  Component Value Date   CHOL 222 (H) 06/18/2021   TRIG 72.0 06/18/2021   HDL 56.90 06/18/2021   CHOLHDL 4 06/18/2021   VLDL 14.4 06/18/2021   LDLCALC 150 (H) 06/18/2021   LDLCALC 166 (H) 11/17/2018   Lab Results  Component Value Date   TSH 0.735 11/17/2018   TSH 2.49 05/05/2017    Therapeutic  Level Labs: No results found for: LITHIUM No results found for: VALPROATE No results found for: CBMZ  Screenings:  AUDIT    Flowsheet Row Counselor from 01/16/2022 in [facility]  Alcohol Use Disorder Identification Test Final Score (AUDIT) 8   GAD-7    Flowsheet Row Clinical Support from 09/17/2023 in [facility] Counselor from 02/15/2023 in [facility] Office Visit from 04/24/2022 in [facility] Counselor from 01/16/2022 in [facility] Video Visit from 05/27/2021 in [facility]  Total GAD-7 Score 4 8 0 7 3   PHQ2-9    Flowsheet Row Clinical Support from 09/17/2023 in [facility] Counselor from 02/15/2023 in [facility] Office Visit from 04/24/2022 in [facility] Counselor from 01/16/2022 in [facility] Video Visit from 05/27/2021 in [facility]  PHQ-2 Total Score 3 4 0 5 3  PHQ-9 Total Score 8 15 2 14 6     Flowsheet Row Counselor from 01/16/2022 in [facility] Video Visit from 05/27/2021 in [facility]  C-SSRS RISK CATEGORY Low Risk No Risk      [name], MD 12/09/2023, 5:44 PM

## 2023-12-10 NOTE — Addendum Note (Signed)
 Addended by: CARVIN CROCK on: 12/10/2023 11:55 AM   Modules accepted: Level of Service

## 2023-12-14 ENCOUNTER — Ambulatory Visit (HOSPITAL_COMMUNITY): Admitting: Mental Health

## 2024-01-04 ENCOUNTER — Ambulatory Visit (HOSPITAL_COMMUNITY): Admitting: Mental Health

## 2024-01-05 ENCOUNTER — Ambulatory Visit (HOSPITAL_COMMUNITY): Admitting: Mental Health

## 2024-01-19 ENCOUNTER — Ambulatory Visit (INDEPENDENT_AMBULATORY_CARE_PROVIDER_SITE_OTHER): Admitting: Mental Health

## 2024-01-19 DIAGNOSIS — F900 Attention-deficit hyperactivity disorder, predominantly inattentive type: Secondary | ICD-10-CM

## 2024-01-19 DIAGNOSIS — F319 Bipolar disorder, unspecified: Secondary | ICD-10-CM

## 2024-01-19 NOTE — Progress Notes (Deleted)
  [facility] [address] [place] [address] Dept: [phone] Dept Fax: [phone]  Psychology Therapy Session Progress Note  Patient ID: Kathryn [name], female  DOB: [birth year], 28 y.o.  MRN: [id number]  01/19/2024 Start time: 8:05am End time: 8:58am  Session #: ***  Method of Visit: Video: Virtual Visit via Video Encounter:  I connected with [name] on 01/19/24 at  8:00 AM EST by a video enabled telemedicine application and verified that I am speaking with the correct person using two identifiers.   Location: Patient: home Provider: office   I discussed the limitations of evaluation and management by telemedicine and the availability of in person appointments. The patient expressed understanding and agreed to proceed.  I discussed the assessment and treatment plan with the patient. The patient was provided an opportunity to ask questions and all were answered. The patient agreed with the plan and demonstrated an understanding of the instructions.   The patient was advised to call back or seek an in-person evaluation if the symptoms worsen or if the condition fails to improve as anticipated.  I provided 53 minutes of non-face-to-face time during this encounter.  Present: {family members:20773}  Service provided: {Service provided:210140005}  Current Concerns: ***  Current Symptoms: {Current [phone]}  Mental Status: Appearance: {Appearance:22683} Attention: {Desc; good/fair/poor:18582} Motor Behavior: {Motor Behavior:210140008} Affect: {Affect (PAA):22687} Mood: {mood:31886} Thought Process: {thought process:31888} Thought Content: {Exam; psych thought content:30907} Suicidal Ideation: {Suicidal Ideation:21014009} Homicidal Ideation:{Homicidal Ideation:21014010} Orientation: {Exam; Orientation:18546} Insight: {Psy- ROS INSIGHT:22851} Judgement: {Judgment (PAA):22694} Other mental status  observations: ***  Diagnosis: ***  Long Term Treatment Goals: ***  Anticipated Frequency of Visits: *** Anticipated Length of Treatment Episode: ***  Short Term Goals/Goals for Treatment Session: ***  Treatment Intervention: {Treatment [phone]}  Response to Treatment: {Response to Treatment:210140006}  Medical Necessity: {Medical Necessity:210140004}  Plan: ***  Ty Bernice Savant, [facility] 01/19/2024

## 2024-01-19 NOTE — Progress Notes (Signed)
 THERAPIST PROGRESS NOTE  Virtual Visit via Video Note   I connected with [name] on 01/19/24 at  8:00 AM EDT by a video enabled telemedicine application and verified that I am speaking with the correct person using two identifiers.   Location: Patient: home address on file Provider: office   I discussed the limitations of evaluation and management by telemedicine and the availability of in person appointments. The patient expressed understanding and agreed to proceed.   I discussed the assessment and treatment plan with the patient. The patient was provided an opportunity to ask questions and all were answered. The patient agreed with the plan and demonstrated an understanding of the instructions.   The patient was advised to call back or seek an in-person evaluation if the symptoms worsen or if the condition fails to improve as anticipated.   I provided 53 minutes of non-face-to-face time during this encounter.  Session Time: 8:05am - 8:58am  Participation Level: Active  Behavioral Response: Neat and Well GroomedAlertEuthymic  Type of Therapy: Individual Therapy  Treatment Goals addressed: STG: Teyonna will increase overall wellness/ development of healthy habits AEB development of daily routine with improvement in physical health with reduced drinking and binge eating behaviors per self report within the next 90 days.    STG: Davena will increase management of moods AEB development of effective coping skills with ability to explore and engage in enjoyable activities and self-care weekly withn the next 6 months.   ProgressTowards Goals: Progressing  Interventions: CBT and Supportive  Summary:  Doniesha Landau is a 28 y.o. female who presents with dx of bipolar d/o, PTSD, GAD and ADHD. Cova presents alert and oriented; mood and affect adequate, stable.  Speech clear and coherent at normal rate and tone. Engaged. Shares to be doing well and reports for moods to be stable at  this time. Shares recent events of conflict with coworker and shares with therapist ability to navigate difficulty emotions and engage with staff in professional manner. Reports onset of new romantic relationship with ability to set boundaries and effective communicate boundaries. Explores with therapist hx of relationships with issues with abandonment and co-dependence with acknowledgement of these patterns and development of new behaviors. Shares working on establishing routine and progress with  physical health and attendance to daily routine. Denies safety concerns. Ongoing work towards goals.   Will complete annual treatment plan at next session.    Suicidal/Homicidal: No  Therapist Response: Therapist engaged Teryl in [employer]. Confirmed current location and ability to hold confidential session. Therapist engaged Alaila in check in and assessed for current level of functioning sxs management. Active empathic listening providing support and encouragement. Provided safe place to share thoughts and concerns. Explores ability to engage in effective communication with assertiveness and professionalism. Reviewd ability to set appropriate healthy boundaries. Reviewed progress with goals. Reviewed session and provided follow up.   Plan: Return again in 8 weeks.  Diagnosis: Bipolar 1 disorder, depressed (HCC)  Attention deficit hyperactivity disorder (ADHD), predominantly inattentive type  Collaboration of Care: None  Patient/Guardian was advised Release of Information must be obtained prior to any record release in order to collaborate their care with an outside provider. Patient/Guardian was advised if they have not already done so to contact the registration department to sign all necessary forms in order for us  to release information regarding their care.   Consent: Patient/Guardian gives verbal consent for treatment and assignment of benefits for services provided during this visit.  Patient/Guardian expressed  understanding and agreed to proceed.   Ty Asal [place], [facility] 01/19/2024

## 2024-01-19 NOTE — Progress Notes (Deleted)
 [facility] MD/PA/NP OP Progress Note  01/19/2024 9:03 AM Kathryn [name]  MRN:  [id number]  Chief Complaint: No chief complaint on file.  HPI: *** Visit Diagnosis: No diagnosis found.  Past Psychiatric History: ***  Past Medical History:  Past Medical History:  Diagnosis Date   Abnormal liver function tests 02/02/2016   ADHD 06/10/2016   Alcohol use 02/23/2016   Allergy    Anxiety    Arm fracture, right    as child, fx in three places, no surgery   Depression    Depression with anxiety 04-13-2014   PTSD s/p death of best friend    Essential hypertension 02/02/2016   GERD (gastroesophageal reflux disease)    H/O cold sores 04/15/2015   Headache 04/09/2016   Hyperlipidemia, mixed 02/02/2016   Migraine    Ovarian cyst    PCOS (polycystic ovarian syndrome)    Preventative health care 10/27/2015   Thrombocytosis 04/09/2016   Vaginal Pap smear, abnormal    HGSIL    Past Surgical History:  Procedure Laterality Date   ADENOIDECTOMY  2002   TYMPANOSTOMY TUBE PLACEMENT Bilateral 1999    Family Psychiatric History: ***  Family History:  Family History  Problem Relation Age of Onset   Hypertension Mother    Heart disease Mother        CAD, stent at 24, s/p cardiac arrest with Defib   Hyperlipidemia Mother    Diabetes Mother        s/p gastric bypass   Obesity Mother        s/p gastric bypass   OCD Mother    Hyperlipidemia Father    Hypertension Father    Anxiety disorder Father    Asthma Sister    Hyperlipidemia Brother    Anxiety disorder Brother    Stroke Maternal Grandmother    Thyroid  disease Maternal Grandmother    Anxiety disorder Maternal Grandmother    Heart disease Maternal Grandfather        MI first at 53, s/p stents and CABG   Hyperlipidemia Maternal Grandfather    Hypertension Maternal Grandfather    Stroke Maternal Grandfather    Cancer Maternal Grandfather        melanoma   Aneurysm Maternal Grandfather        brain   Kidney disease Maternal Grandfather         tumor   Heart disease Paternal Grandmother    Cancer Paternal Grandfather        ung and brain   Heart disease Maternal Uncle    Vision loss Maternal Uncle    Anxiety disorder Maternal Uncle    Anxiety disorder Maternal Aunt    Depression Maternal Aunt     Social History:  Social History   Socioeconomic History   Marital status: Single    Spouse name: Not on file   Number of children: 0   Years of education: 12   Highest education level: Not on file  Occupational History    Comment: [employer]  Tobacco Use   Smoking status: Every Day    Types: E-cigarettes   Smokeless tobacco: Current    Types: Snuff  Vaping Use   Vaping status: Never Used  Substance and Sexual Activity   Alcohol use: Yes    Comment: maybe every 6 months   Drug use: Yes    Types: Marijuana    Comment: daily   Sexual activity: Yes    Partners: Male    Birth control/protection: Pill  Other  Topics Concern   Not on file  Social History Narrative   Not on file   Social Drivers of Health   Financial Resource Strain: Medium Risk (01/16/2022)   Overall Financial Resource Strain (CARDIA)    Difficulty of Paying Living Expenses: Somewhat hard  Food Insecurity: No Food Insecurity (01/16/2022)   Hunger Vital Sign    Worried About Running Out of Food in the Last Year: Never true    Ran Out of Food in the Last Year: Never true  Transportation Needs: No Transportation Needs (01/16/2022)   PRAPARE - Administrator, Civil Service (Medical): No    Lack of Transportation (Non-Medical): No  Physical Activity: Inactive (01/16/2022)   Exercise Vital Sign    Days of Exercise per Week: 0 days    Minutes of Exercise per Session: 0 min  Stress: No Stress Concern Present (01/16/2022)   [employer] of Occupational Health - Occupational Stress Questionnaire    Feeling of Stress : Only a little  Social Connections: Socially Isolated (01/16/2022)   Social Connection and Isolation Panel    Frequency  of Communication with Friends and Family: More than three times a week    Frequency of Social Gatherings with Friends and Family: Never    Attends Religious Services: Never    Database Administrator or Organizations: No    Attends Banker Meetings: Never    Marital Status: Never married    Allergies:  Allergies  Allergen Reactions   Oxycodone  Nausea Only, Nausea And Vomiting and Other (See Comments)    Oxycontin  also Oxycontin  also Oxycontin  also Oxycontin  also    Quetiapine  Fumarate Other (See Comments)    Mental status changes Mental status changes Mental status changes Mental status changes     Metabolic Disorder Labs: Lab Results  Component Value Date   HGBA1C 5.2 11/17/2018   No results found for: PROLACTIN Lab Results  Component Value Date   CHOL 222 (H) 06/18/2021   TRIG 72.0 06/18/2021   HDL 56.90 06/18/2021   CHOLHDL 4 06/18/2021   VLDL 14.4 06/18/2021   LDLCALC 150 (H) 06/18/2021   LDLCALC 166 (H) 11/17/2018   Lab Results  Component Value Date   TSH 0.735 11/17/2018   TSH 2.49 05/05/2017    Therapeutic Level Labs: No results found for: LITHIUM No results found for: VALPROATE No results found for: CBMZ  Current Medications: Current Outpatient Medications  Medication Sig Dispense Refill   amphetamine -dextroamphetamine  (ADDERALL) 12.5 MG tablet Take 2 tablets by mouth 2 (two) times daily. 120 tablet 0   amphetamine -dextroamphetamine  (ADDERALL) 12.5 MG tablet Take 2 tablets by mouth 2 (two) times daily. 120 tablet 0   lamoTRIgine  (LAMICTAL ) 200 MG tablet Take 1 tablet (200 mg total) by mouth at bedtime. Take one a day 90 tablet 2   Levonorgestrel-Ethinyl Estradiol (AMETHIA) 0.15-0.03 &0.01 MG tablet Take 1 tablet by mouth daily. 91 tablet 4   omeprazole  (PRILOSEC) 40 MG capsule Take 1 capsule (40 mg total) by mouth 2 (two) times daily. 180 capsule 3   ondansetron  (ZOFRAN ) 4 MG tablet Take 1 tablet (4 mg total) by mouth every 8  (eight) hours as needed for nausea or vomiting. 10 tablet 0   valACYclovir  (VALTREX ) 1000 MG tablet Take 1 tablet (1,000 mg total) by mouth daily. 90 tablet 1   venlafaxine  XR (EFFEXOR -XR) 75 MG 24 hr capsule Take 1 capsule (75 mg total) by mouth daily with breakfast. TAKE ONE CAPSULE BY MOUTH DAILY  WITH BREAKFAST 90 capsule 0   No current facility-administered medications for this visit.     Musculoskeletal: Strength & Muscle Tone: {desc; muscle tone:32375} Gait & Station: {PE GAIT ED WJUO:77474} Patient leans: {Patient Leans:21022755}  Psychiatric Specialty Exam: Review of Systems  There were no vitals taken for this visit.There is no height or weight on file to calculate BMI.  General Appearance: {Appearance:22683}  Eye Contact:  {BHH EYE CONTACT:22684}  Speech:  {Speech:22685}  Volume:  {Volume (PAA):22686}  Mood:  {BHH MOOD:22306}  Affect:  {Affect (PAA):22687}  Thought Process:  {Thought Process (PAA):22688}  Orientation:  {BHH ORIENTATION (PAA):22689}  Thought Content: {Thought Content:22690}   Suicidal Thoughts:  {ST/HT (PAA):22692}  Homicidal Thoughts:  {ST/HT (PAA):22692}  Memory:  {BHH MEMORY:22881}  Judgement:  {Judgement (PAA):22694}  Insight:  {Insight (PAA):22695}  Psychomotor Activity:  {Psychomotor (PAA):22696}  Concentration:  {Concentration:21399}  Recall:  {BHH GOOD/FAIR/POOR:22877}  Fund of Knowledge: {BHH GOOD/FAIR/POOR:22877}  Language: {BHH GOOD/FAIR/POOR:22877}  Akathisia:  {BHH YES OR NO:22294}  Handed:  {Handed:22697}  AIMS (if indicated): {Desc; done/not:10129}  Assets:  {Assets (PAA):22698}  ADL's:  {BHH JIO'D:77709}  Cognition: {chl bhh cognition:304700322}  Sleep:  {BHH GOOD/FAIR/POOR:22877}   Screenings: AUDIT    Advertising Copywriter from 01/16/2022 in [facility]  Alcohol Use Disorder Identification Test Final Score (AUDIT) 8   GAD-7    Flowsheet Row Clinical Support from 09/17/2023 in [facility] Counselor from 02/15/2023 in [facility] Office Visit from 04/24/2022 in [facility] Counselor from 01/16/2022 in [facility] Video Visit from 05/27/2021 in [facility]  Total GAD-7 Score 4 8 0 7 3   PHQ2-9    Flowsheet Row Clinical Support from 09/17/2023 in [facility] Counselor from 02/15/2023 in [facility] Office Visit from 04/24/2022 in [facility] Counselor from 01/16/2022 in [facility] Video Visit from 05/27/2021 in [facility]  PHQ-2 Total Score 3 4 0 5 3  PHQ-9 Total Score 8 15 2 14 6    Flowsheet Row Counselor from 01/16/2022 in [facility] Video Visit from 05/27/2021 in [facility]  C-SSRS RISK CATEGORY Low Risk No Risk     Assessment and Plan: ***  Collaboration of Care: Collaboration of Care: [facility] OP Collaboration of Care:[id number]}  Patient/Guardian was advised Release of Information must be obtained prior to any record release in order to collaborate their care with an outside provider. Patient/Guardian was advised if they have not already done so to contact the registration department to sign all necessary forms in order for us  to release information regarding their care.   Consent: Patient/Guardian gives verbal consent for treatment and assignment of benefits for services provided during this visit. Patient/Guardian expressed understanding and agreed to proceed.    Ty Bernice Savant, [facility] 01/19/2024, 9:03 AM

## 2024-01-20 ENCOUNTER — Ambulatory Visit: Payer: Self-pay | Admitting: Medical

## 2024-01-20 ENCOUNTER — Encounter: Payer: Self-pay | Admitting: Medical

## 2024-01-20 ENCOUNTER — Other Ambulatory Visit (HOSPITAL_BASED_OUTPATIENT_CLINIC_OR_DEPARTMENT_OTHER): Payer: Self-pay

## 2024-01-20 ENCOUNTER — Telehealth (INDEPENDENT_AMBULATORY_CARE_PROVIDER_SITE_OTHER): Admitting: Student in an Organized Health Care Education/Training Program

## 2024-01-20 VITALS — BP 148/95 | HR 89 | Temp 98.5°F | Resp 16 | Ht 68.5 in | Wt 202.6 lb

## 2024-01-20 DIAGNOSIS — Z720 Tobacco use: Secondary | ICD-10-CM

## 2024-01-20 DIAGNOSIS — I10 Essential (primary) hypertension: Secondary | ICD-10-CM

## 2024-01-20 DIAGNOSIS — Z0000 Encounter for general adult medical examination without abnormal findings: Secondary | ICD-10-CM | POA: Diagnosis not present

## 2024-01-20 DIAGNOSIS — F411 Generalized anxiety disorder: Secondary | ICD-10-CM | POA: Diagnosis not present

## 2024-01-20 DIAGNOSIS — F900 Attention-deficit hyperactivity disorder, predominantly inattentive type: Secondary | ICD-10-CM

## 2024-01-20 DIAGNOSIS — F319 Bipolar disorder, unspecified: Secondary | ICD-10-CM

## 2024-01-20 DIAGNOSIS — F909 Attention-deficit hyperactivity disorder, unspecified type: Secondary | ICD-10-CM | POA: Diagnosis not present

## 2024-01-20 DIAGNOSIS — F4310 Post-traumatic stress disorder, unspecified: Secondary | ICD-10-CM

## 2024-01-20 LAB — CBC WITH DIFFERENTIAL/PLATELET
Basophils Absolute: 0 K/uL (ref 0.0–0.1)
Basophils Relative: 0.6 % (ref 0.0–3.0)
Eosinophils Absolute: 0.3 K/uL (ref 0.0–0.7)
Eosinophils Relative: 3.9 % (ref 0.0–5.0)
HCT: 42.2 % (ref 36.0–46.0)
Hemoglobin: 14.1 g/dL (ref 12.0–15.0)
Lymphocytes Relative: 27.2 % (ref 12.0–46.0)
Lymphs Abs: 1.9 K/uL (ref 0.7–4.0)
MCHC: 33.3 g/dL (ref 30.0–36.0)
MCV: 98.4 fl (ref 78.0–100.0)
Monocytes Absolute: 0.7 K/uL (ref 0.1–1.0)
Monocytes Relative: 9.4 % (ref 3.0–12.0)
Neutro Abs: 4.2 K/uL (ref 1.4–7.7)
Neutrophils Relative %: 58.9 % (ref 43.0–77.0)
Platelets: 362 K/uL (ref 150.0–400.0)
RBC: 4.29 Mil/uL (ref 3.87–5.11)
RDW: 12.9 % (ref 11.5–15.5)
WBC: 7 K/uL (ref 4.0–10.5)

## 2024-01-20 LAB — LIPID PANEL
Cholesterol: 224 mg/dL — ABNORMAL HIGH (ref 0–200)
HDL: 54 mg/dL (ref >39.00)
LDL Cholesterol: 157 mg/dL — ABNORMAL HIGH (ref 0–99)
NonHDL: 169.99
Total CHOL/HDL Ratio: 4
Triglycerides: 66 mg/dL (ref 0.0–149.0)
VLDL: 13.2 mg/dL (ref 0.0–40.0)

## 2024-01-20 LAB — COMPREHENSIVE METABOLIC PANEL WITH GFR
ALT: 65 U/L — ABNORMAL HIGH (ref 0–35)
AST: 45 U/L — ABNORMAL HIGH (ref 0–37)
Albumin: 4.6 g/dL (ref 3.5–5.2)
Alkaline Phosphatase: 58 U/L (ref 39–117)
BUN: 11 mg/dL (ref 6–23)
CO2: 29 meq/L (ref 19–32)
Calcium: 9.6 mg/dL (ref 8.4–10.5)
Chloride: 105 meq/L (ref 96–112)
Creatinine, Ser: 0.89 mg/dL (ref 0.40–1.20)
GFR: 87.86 mL/min (ref >60.00)
Glucose, Bld: 85 mg/dL (ref 70–99)
Potassium: 5 meq/L (ref 3.5–5.1)
Sodium: 141 meq/L (ref 135–145)
Total Bilirubin: 0.3 mg/dL (ref 0.2–1.2)
Total Protein: 6.7 g/dL (ref 6.0–8.3)

## 2024-01-20 MED ORDER — AMPHETAMINE-DEXTROAMPHETAMINE 12.5 MG PO TABS: 25.0000 mg | ORAL_TABLET | Freq: Two times a day (BID) | ORAL | 0 refills | Status: DC

## 2024-01-20 MED ORDER — LAMOTRIGINE 200 MG PO TABS: 200.0000 mg | ORAL_TABLET | Freq: Every day | ORAL | 2 refills | Status: AC

## 2024-01-20 MED ORDER — VALSARTAN 80 MG PO TABS: 80.0000 mg | ORAL_TABLET | Freq: Every day | ORAL | 0 refills | Status: DC

## 2024-01-20 NOTE — Patient Instructions (Addendum)
 For you wellness exam today I have ordered cbc, cmp and lipid panel.  Declined flu vaccine  Recommend exercise and healthy diet.  We will let you know lab results as they come in  Encouraged/discussed to schedule pap with gyn.  Follow up date appointment will be determined after lab review.    Essential hypertension Hypertension with recent spikes. Blood pressure 148/98 to 179/101. Family history of hypertension and cardiovascular disease. Current medications include Adderall, potential contributor to elevated blood pressure. No current antihypertensive medication. Pharmacist confirmed no significant interactions with Seroquel  and valsartan . - Prescribed valsartan  80 mg daily. - Instructed to monitor blood pressure at home and log readings. - Scheduled follow-up in 10-14 days to reassess blood pressure. - Advised to continue lifestyle modifications, including reduced caffeine and nicotine intake. Discuss stimulant use with specialist. That can effect bp.   Preventive Care 72-22 Years Old, Female Preventive care refers to lifestyle choices and visits with your health care provider that can promote health and wellness. Preventive care visits are also called wellness exams. What can I expect for my preventive care visit? Counseling During your preventive care visit, your health care provider may ask about your: Medical history, including: Past medical problems. Family medical history. Pregnancy history. Current health, including: Menstrual cycle. Method of birth control. Emotional well-being. Home life and relationship well-being. Sexual activity and sexual health. Lifestyle, including: Alcohol, nicotine or tobacco, and drug use. Access to firearms. Diet, exercise, and sleep habits. Work and work astronomer. Sunscreen use. Safety issues such as seatbelt and bike helmet use. Physical exam Your health care provider may check your: Height and weight. These may be used to  calculate your BMI (body mass index). BMI is a measurement that tells if you are at a healthy weight. Waist circumference. This measures the distance around your waistline. This measurement also tells if you are at a healthy weight and may help predict your risk of certain diseases, such as type 2 diabetes and high blood pressure. Heart rate and blood pressure. Body temperature. Skin for abnormal spots. What immunizations do I need?  Vaccines are usually given at various ages, according to a schedule. Your health care provider will recommend vaccines for you based on your age, medical history, and lifestyle or other factors, such as travel or where you work. What tests do I need? Screening Your health care provider may recommend screening tests for certain conditions. This may include: Pelvic exam and Pap test. Lipid and cholesterol levels. Diabetes screening. This is done by checking your blood sugar (glucose) after you have not eaten for a while (fasting). Hepatitis B test. Hepatitis C test. HIV (human immunodeficiency virus) test. STI (sexually transmitted infection) testing, if you are at risk. BRCA-related cancer screening. This may be done if you have a family history of breast, ovarian, tubal, or peritoneal cancers. Talk with your health care provider about your test results, treatment options, and if necessary, the need for more tests. Follow these instructions at home: Eating and drinking  Eat a healthy diet that includes fresh fruits and vegetables, whole grains, lean protein, and low-fat dairy products. Take vitamin and mineral supplements as recommended by your health care provider. Do not drink alcohol if: Your health care provider tells you not to drink. You are pregnant, may be pregnant, or are planning to become pregnant. If you drink alcohol: Limit how much you have to 0-1 drink a day. Know how much alcohol is in your drink. In the [place] one drink equals  one 12 oz  bottle of beer (355 mL), one 5 oz glass of wine (148 mL), or one 1 oz glass of hard liquor (44 mL). Lifestyle Brush your teeth every morning and night with fluoride toothpaste. Floss one time each day. Exercise for at least 30 minutes 5 or more days each week. Do not use any products that contain nicotine or tobacco. These products include cigarettes, chewing tobacco, and vaping devices, such as e-cigarettes. If you need help quitting, ask your health care provider. Do not use drugs. If you are sexually active, practice safe sex. Use a condom or other form of protection to prevent STIs. If you do not wish to become pregnant, use a form of birth control. If you plan to become pregnant, see your health care provider for a prepregnancy visit. Find healthy ways to manage stress, such as: Meditation, yoga, or listening to music. Journaling. Talking to a trusted person. Spending time with friends and family. Minimize exposure to UV radiation to reduce your risk of skin cancer. Safety Always wear your seat belt while driving or riding in a vehicle. Do not drive: If you have been drinking alcohol. Do not ride with someone who has been drinking. If you have been using any mind-altering substances or drugs. While texting. When you are tired or distracted. Wear a helmet and other protective equipment during sports activities. If you have firearms in your house, make sure you follow all gun safety procedures. Seek help if you have been physically or sexually abused. What's next? Go to your health care provider once a year for an annual wellness visit. Ask your health care provider how often you should have your eyes and teeth checked. Stay up to date on all vaccines. This information is not intended to replace advice given to you by your health care provider. Make sure you discuss any questions you have with your health care provider. Document Revised: 07/31/2020 Document Reviewed:  07/31/2020 Elsevier Patient Education  2024 [employer].

## 2024-01-20 NOTE — Progress Notes (Signed)
 Subjective:    Patient ID: Kathryn [name], female    DOB: Dec 03, 1995, 28 y.o.   MRN: [id number]  HPI   Will do wellness exam today. Pt is fasting.  Working behavioral therapy for Autism. Eating better. Exercising 3 times a week. Cut back on caffeine with some excess caffeine intake. 3 coke zero a day.  Pt vapes but quit smoking.    Pt declines flu vaccine.  Last pap done in 2023. Advised to follow up Explained.    Kathryn [name] is a 28 year old female who presents with blood pressure management concerns.  She has had persistently elevated home blood pressures, typically 148/98 to 169/119 mmHg, with a recent spike to 179/101 mmHg associated with tunnel vision and vomiting. She is not currently on antihypertensive medication. She previously had high blood pressure about eight years ago when she weighed 300 pounds, which improved after significant weight loss.  Currently she has not neurologic signs/symptoms. Feel fine. No cardiac symptoms as well.  She has intermittent episodes of feeling hot, sweaty, and nauseous that she worries may be related to blood pressure, though she did not measure it during those episodes. She takes Adderall for ADHD and has considered it as a contributor, but notes her blood pressure is still high when she is off the medication.  She stress to me that she has to be on ADD meds.   She has a strong family history of hypertension and early cardiovascular events, including an uncle who died of a heart attack at 72 and a mother who had a cardiac arrest at 48. She has reduced alcohol, nicotine, and caffeine use, increased exercise, and stopped risperidone  in an attempt to lower her blood pressure.  She experienced marked sedation with Seroquel , described as feeling very slow and sluggish before falling asleep, which was labeled as an allergy though not a true allergic reaction.    Pt states on contraceptive now. Rx advisement given.discussed valsartan and preg.  She states should not be issue as she note sexual orientaion/gay.   Review of Systems  Constitutional:  Negative for chills, fatigue and fever.  HENT:  Negative for congestion.   Respiratory:  Negative for chest tightness, shortness of breath and wheezing.   Cardiovascular:  Negative for chest pain and palpitations.  Gastrointestinal:  Negative for abdominal pain, constipation and rectal pain.  Genitourinary:  Negative for dysuria and frequency.  Musculoskeletal:  Negative for back pain, myalgias and neck stiffness.  Skin:  Negative for rash.  Neurological:  Negative for dizziness, speech difficulty, weakness and numbness.       No current symptoms.  Hematological:  Negative for adenopathy.  Psychiatric/Behavioral:  Negative for confusion.     Past Medical History:  Diagnosis Date   Abnormal liver function tests 02/02/2016   ADHD 06/10/2016   Alcohol use 02/23/2016   Allergy    Anxiety    Arm fracture, right    as child, fx in three places, no surgery   Depression    Depression with anxiety April 13, 2014   PTSD s/p death of best friend    Essential hypertension 02/02/2016   GERD (gastroesophageal reflux disease)    H/O cold sores 04/15/2015   Headache 04/09/2016   Hyperlipidemia, mixed 02/02/2016   Migraine    Ovarian cyst    PCOS (polycystic ovarian syndrome)    Preventative health care 10/27/2015   Thrombocytosis 04/09/2016   Vaginal Pap smear, abnormal    HGSIL     Social History  Socioeconomic History   Marital status: Single    Spouse name: Not on file   Number of children: 0   Years of education: 12   Highest education level: Not on file  Occupational History    Comment: [employer]  Tobacco Use   Smoking status: Every Day    Types: E-cigarettes   Smokeless tobacco: Current    Types: Snuff  Vaping Use   Vaping status: Never Used  Substance and Sexual Activity   Alcohol use: Yes    Comment: maybe every 6 months   Drug use: Yes    Types: Marijuana     Comment: daily   Sexual activity: Yes    Partners: Male    Birth control/protection: Pill  Other Topics Concern   Not on file  Social History Narrative   Not on file   Social Drivers of Health   Financial Resource Strain: Medium Risk (01/16/2022)   Overall Financial Resource Strain (CARDIA)    Difficulty of Paying Living Expenses: Somewhat hard  Food Insecurity: No Food Insecurity (01/16/2022)   Hunger Vital Sign    Worried About Running Out of Food in the Last Year: Never true    Ran Out of Food in the Last Year: Never true  Transportation Needs: No Transportation Needs (01/16/2022)   PRAPARE - Administrator, Civil Service (Medical): No    Lack of Transportation (Non-Medical): No  Physical Activity: Inactive (01/16/2022)   Exercise Vital Sign    Days of Exercise per Week: 0 days    Minutes of Exercise per Session: 0 min  Stress: No Stress Concern Present (01/16/2022)   [employer] of Occupational Health - Occupational Stress Questionnaire    Feeling of Stress : Only a little  Social Connections: Socially Isolated (01/16/2022)   Social Connection and Isolation Panel    Frequency of Communication with Friends and Family: More than three times a week    Frequency of Social Gatherings with Friends and Family: Never    Attends Religious Services: Never    Database Administrator or Organizations: No    Attends Banker Meetings: Never    Marital Status: Never married  Intimate Partner Violence: Not At Risk (01/16/2022)   Humiliation, Afraid, Rape, and Kick questionnaire    Fear of Current or Ex-Partner: No    Emotionally Abused: No    Physically Abused: No    Sexually Abused: No    Past Surgical History:  Procedure Laterality Date   ADENOIDECTOMY  2002   TYMPANOSTOMY TUBE PLACEMENT Bilateral 1999    Family History  Problem Relation Age of Onset   Hypertension Mother    Heart disease Mother        CAD, stent at 41, s/p cardiac arrest with Defib    Hyperlipidemia Mother    Diabetes Mother        s/p gastric bypass   Obesity Mother        s/p gastric bypass   OCD Mother    Hyperlipidemia Father    Hypertension Father    Anxiety disorder Father    Asthma Sister    Hyperlipidemia Brother    Anxiety disorder Brother    Stroke Maternal Grandmother    Thyroid  disease Maternal Grandmother    Anxiety disorder Maternal Grandmother    Heart disease Maternal Grandfather        MI first at 81, s/p stents and CABG   Hyperlipidemia Maternal Grandfather    Hypertension Maternal  Grandfather    Stroke Maternal Grandfather    Cancer Maternal Grandfather        melanoma   Aneurysm Maternal Grandfather        brain   Kidney disease Maternal Grandfather        tumor   Heart disease Paternal Grandmother    Cancer Paternal Grandfather        ung and brain   Heart disease Maternal Uncle    Vision loss Maternal Uncle    Anxiety disorder Maternal Uncle    Anxiety disorder Maternal Aunt    Depression Maternal Aunt     Allergies  Allergen Reactions   Oxycodone  Nausea Only, Nausea And Vomiting and Other (See Comments)    Oxycontin  also Oxycontin  also Oxycontin  also Oxycontin  also    Quetiapine  Fumarate Other (See Comments)    Mental status changes Mental status changes Mental status changes Mental status changes     Current Outpatient Medications on File Prior to Visit  Medication Sig Dispense Refill   amphetamine -dextroamphetamine  (ADDERALL) 12.5 MG tablet Take 2 tablets by mouth 2 (two) times daily. 120 tablet 0   amphetamine -dextroamphetamine  (ADDERALL) 12.5 MG tablet Take 2 tablets by mouth 2 (two) times daily. 120 tablet 0   lamoTRIgine  (LAMICTAL ) 200 MG tablet Take 1 tablet (200 mg total) by mouth at bedtime. Take one a day 90 tablet 2   Levonorgestrel-Ethinyl Estradiol (AMETHIA) 0.15-0.03 &0.01 MG tablet Take 1 tablet by mouth daily. 91 tablet 4   omeprazole  (PRILOSEC) 40 MG capsule Take 1 capsule (40 mg total) by mouth 2  (two) times daily. 180 capsule 3   ondansetron  (ZOFRAN ) 4 MG tablet Take 1 tablet (4 mg total) by mouth every 8 (eight) hours as needed for nausea or vomiting. 10 tablet 0   valACYclovir  (VALTREX ) 1000 MG tablet Take 1 tablet (1,000 mg total) by mouth daily. 90 tablet 1   venlafaxine  XR (EFFEXOR -XR) 75 MG 24 hr capsule Take 1 capsule (75 mg total) by mouth daily with breakfast. TAKE ONE CAPSULE BY MOUTH DAILY  WITH BREAKFAST 90 capsule 0   No current facility-administered medications on file prior to visit.    BP (!) 148/95   Pulse 89   Temp 98.5 F (36.9 C) (Oral)   Resp 16   Ht 5' 8.5 (1.74 m)   Wt 202 lb 9.6 oz (91.9 kg)   LMP 01/20/2024 (Approximate)   SpO2 99%   BMI 30.36 kg/m        Objective:   Physical Exam  General Mental Status- Alert. General Appearance- Not in acute distress.   Skin General: Color- Normal Color. Moisture- Normal Moisture.  Neck Carotid Arteries- Normal color. Moisture- Normal Moisture. No carotid bruits. No JVD.  Chest and Lung Exam Auscultation: Breath Sounds:-CTA  Cardiovascular Auscultation:Rythm- RRR Murmurs & Other Heart Sounds:Auscultation of the heart reveals- No Murmurs.  Abdomen Inspection:-Inspeection Normal. Palpation/Percussion:Note:No mass. Palpation and Percussion of the abdomen reveal- Non Tender, Non Distended + BS, no rebound or guarding.    Neurologic Cranial Nerve exam:- CN III-XII intact(No nystagmus), symmetric smile. Drift Test:- No drift. Romberg Exam:- Negative.  Heal to Toe Gait exam:-Normal. Finger to Nose:- Normal/Intact Strength:- 5/5 equal and symmetric strength both upper and lower extremities.       Assessment & Plan:   Patient Instructions  For you wellness exam today I have ordered cbc, cmp and lipid panel.  Declined flu vaccine  Recommend exercise and healthy diet.  We will let you know lab results as  they come in.  Follow up date appointment will be determined after lab review.     Essential hypertension Hypertension with recent spikes. Blood pressure 148/98 to 179/101. Family history of hypertension and cardiovascular disease. Current medications include Adderall, potential contributor to elevated blood pressure. No current antihypertensive medication. Pharmacist confirmed no significant interactions with Seroquel  and valsartan. - Prescribed valsartan 80 mg daily. - Instructed to monitor blood pressure at home and log readings. - Scheduled follow-up in 10-14 days to reassess blood pressure. - Advised to continue lifestyle modifications, including reduced caffeine and nicotine intake. Discuss stimulant use with specialist. That can effect bp.   [id number] chage in addition to wellness as did need to rx med for htn and talk with pharmacist.

## 2024-01-20 NOTE — Progress Notes (Unsigned)
 [facility] MD Outpatient Progress Note  01/20/2024 10:00 AM [name]  MRN:  [id number]  Virtual Visit via Telephone Note  I connected with Kathryn [name] on 01/20/24 at 10:00 AM EST by a video enabled telemedicine application and verified that I am speaking with the correct person using two identifiers.  Location: Patient: Car, parked, in a safe place Provider: Office   I discussed the limitations, risks, security and privacy concerns of performing an evaluation and management service by telephone and the availability of in person appointments. I also discussed with the patient that there may be a patient responsible charge related to this service. The patient expressed understanding and agreed to proceed.   I discussed the assessment and treatment plan with the patient. The patient was provided an opportunity to ask questions and all were answered. The patient agreed with the plan and demonstrated an understanding of the instructions.   The patient was advised to call back or seek an in-person evaluation if the symptoms worsen or if the condition fails to improve as anticipated.   [name], MD Psych Resident, PGY-3      Assessment:  Kathryn [name] presents for follow-up evaluation on 01/20/24 .   At today's visit, the patient continues to report that her current dose of Adderall is ineffective. Given her persistent hypertension and multiple contributing factors, including chronic tobacco use, late-night caffeine, cannabis use, and the hypertensive effects of Effexor , no further titration of Adderall is planned. The current approach is to gradually reduce stimulant medication. At the next follow-up, we will attempt to convert part of her immediate-release Adderall to extended-release, with a small immediate-release dose in the afternoon.  We also discussed transitioning from Adderall to Vyvanse as an alternative.  She was started on valsartan  80 mg daily as prescribed by PCP to  manage hypertension.  It is likely her sleep disturbances continue to worsen both blood pressure and mood symptoms. Effexor  is appropriate to continue at the current dose for now, but due to its potential to elevate blood pressure, we will consider switching to an alternative antidepressant with a more favorable cardiovascular profile at a future visit.  Identifying Information: Kathryn [name] is a 28 y.o. female with a history of Bipolar Disorder, GAD, PTSD, and ADHD who is an established patient with [facility] for management of mood. She was formerly a patient of Dr. Raliegh. For a comprehensive history and detailed assessment, please refer to the initial adult assessment.  The patient's PMHx is significant for GERD.   Plan:  # Bipolar I Disorder vs underlying Cluster B personality traits #PTSD #GAD Past medication trials: Remeron  due to AEs, Prozac (stopped due to futility), Zoloft  (stopped due to futility), Risperdal  Status of problem: Unchanged, chronic Interventions: -- Continue Effexor  XR 75 mg daily for depression and anxiety -- Continue Lamictal  200 mg QHS for mood stability -- Continue Zofran  4 mg q8 PRN -- Continue therapy with Bernice  # ADHD Past medication trials: strattera (stopped due to futility), ritalin  Status of problem: Unchanged, chronic Interventions: -- Continue Adderall 25 mg BID  -- Plan to transition to XR dosing, and IR afternoon dose  #HTN - Encourage PCP follow-up - Multifactorial (family history of hypertension, ongoing alcohol and nicotine use, excess caffeine use,on several hypertensive medications, lifestyle)  # Tobacco use disorder (vape) Prior tiral of chantix  -- Declined NRT --Smoking cessation encouraged  Patient was given contact information for behavioral health clinic and was instructed to call 911 for emergencies.  Subjective:  Chief Complaint:  Chief Complaint  Patient presents with   Follow-up    Interval  History:  Patient presents for follow-up after a recent primary care appointment regarding blood pressure management. She reports ongoing interest in increasing her Adderall dose and inquires about the possibility of a higher dosage. She denies any adverse effects to her current medications and states she is compliant with her regimen, taking all medications as directed without missing doses.  She continues to experience difficulty initiating sleep. This dino provided further education on sleep hygiene strategies. Regarding substance use, she reports ongoing efforts to reduce vaping and acknowledges its impact on her blood pressure. She describes limited cannabis use and denies recent alcohol consumption.  She denies suicidal ideation, homicidal ideation, and auditory or visual hallucinations.   Visit Diagnosis:    ICD-10-CM   1. Attention deficit hyperactivity disorder (ADHD), predominantly inattentive type  F90.0     2. 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, registered development worker, community Social support: best friends and best friend's family, deceased gf's family Maritas status: Single Legal: Denies Firearms: Denies   Social History   Socioeconomic History   Marital status: Single    Spouse name: Not on file   Number of children: 0   Years of education: 12   Highest education level: Not on file  Occupational History    Comment: [employer]  Tobacco Use   Smoking status: Every Day    Types:  E-cigarettes   Smokeless tobacco: Current    Types: Snuff  Vaping Use   Vaping status: Never Used  Substance and Sexual Activity   Alcohol use: Yes    Comment: maybe every 6 months   Drug use: Yes    Types: Marijuana    Comment: daily   Sexual activity: Yes    Partners: Male    Birth control/protection: Pill  Other Topics Concern   Not on file  Social History Narrative   Not on file   Social Drivers of Health   Financial Resource Strain: Medium Risk (01/16/2022)   Overall Financial Resource Strain (CARDIA)    Difficulty of Paying Living Expenses: Somewhat hard  Food Insecurity: No Food Insecurity (01/16/2022)   Hunger Vital Sign    Worried About Running Out of Food in the Last Year: Never true    Ran Out of Food in the Last Year: Never true  Transportation Needs: No Transportation Needs (01/16/2022)   PRAPARE - Administrator, Civil Service (Medical): No    Lack of Transportation (Non-Medical): No  Physical Activity: Inactive (01/16/2022)   Exercise Vital Sign    Days of Exercise per Week: 0 days    Minutes of Exercise per Session: 0 min  Stress: No Stress Concern Present (01/16/2022)   [employer] of Occupational Health - Occupational Stress Questionnaire    Feeling of Stress : Only a little  Social Connections: Socially Isolated (01/16/2022)   Social Connection and Isolation Panel    Frequency of Communication with Friends and Family: More than  three times a week    Frequency of Social Gatherings with Friends and Family: Never    Attends Religious Services: Never    Database Administrator or Organizations: No    Attends Banker Meetings: Never    Marital Status: Never married    Allergies:  Allergies  Allergen Reactions   Oxycodone  Nausea Only, Nausea And Vomiting and Other (See Comments)    Oxycontin  also Oxycontin  also Oxycontin  also Oxycontin  also    Quetiapine  Fumarate Other (See Comments)    Mental status changes Mental status  changes Mental status changes Mental status changes     Current Medications: Current Outpatient Medications  Medication Sig Dispense Refill   amphetamine -dextroamphetamine  (ADDERALL) 12.5 MG tablet Take 2 tablets by mouth 2 (two) times daily. 120 tablet 0   amphetamine -dextroamphetamine  (ADDERALL) 12.5 MG tablet Take 2 tablets by mouth 2 (two) times daily. 120 tablet 0   lamoTRIgine  (LAMICTAL ) 200 MG tablet Take 1 tablet (200 mg total) by mouth at bedtime. Take one a day 90 tablet 2   Levonorgestrel-Ethinyl Estradiol (AMETHIA) 0.15-0.03 &0.01 MG tablet Take 1 tablet by mouth daily. 91 tablet 4   omeprazole  (PRILOSEC) 40 MG capsule Take 1 capsule (40 mg total) by mouth 2 (two) times daily. 180 capsule 3   ondansetron  (ZOFRAN ) 4 MG tablet Take 1 tablet (4 mg total) by mouth every 8 (eight) hours as needed for nausea or vomiting. 10 tablet 0   valACYclovir  (VALTREX ) 1000 MG tablet Take 1 tablet (1,000 mg total) by mouth daily. 90 tablet 1   valsartan  (DIOVAN ) 80 MG tablet Take 1 tablet (80 mg total) by mouth daily. 30 tablet 0   venlafaxine  XR (EFFEXOR -XR) 75 MG 24 hr capsule Take 1 capsule (75 mg total) by mouth daily with breakfast. TAKE ONE CAPSULE BY MOUTH DAILY  WITH BREAKFAST 90 capsule 0   No current facility-administered medications for this visit.    ROS: Review of Systems  All other systems reviewed and are negative.   Objective:  Objective: Psychiatric Specialty Exam: General Appearance: Casual, fairly groomed  Eye Contact:  Good    Speech:  Clear, coherent, normal rate, spontaneous  Volume:  Normal   Mood:  see above  Affect:  Appropriate, congruent, full range  Thought Content: Logical  Suicidal Thoughts: see subjective  Thought Process:  Coherent, goal-directed  Orientation:  A&Ox4   Memory:  Immediate good  Judgment:  Fair   Insight:  Partial  Concentration:  Attention and concentration good   Recall:  Good  Fund of Knowledge: Good  Language: Good, fluent   Psychomotor Activity: Normal  Akathisia:  NA   AIMS (if indicated): NA   Assets:   Communication Skills Desire for Improvement Housing Resilience Social Support Talents/Skills Vocational/Educational  ADL's:  Intact  Cognition: WNL  Sleep: see above  Appetite: see above    Physical Exam Vitals reviewed.  Constitutional:      Appearance: Normal appearance.  Eyes:     Extraocular Movements: Extraocular movements intact.     Conjunctiva/sclera: Conjunctivae normal.  Pulmonary:     Effort: Pulmonary effort is normal. No respiratory distress.  Musculoskeletal:        General: Normal range of motion.  Skin:    General: Skin is warm and dry.  Neurological:     General: No focal deficit present.     Mental Status: She is alert.      Metabolic Disorder Labs: Lab Results  Component Value Date  HGBA1C 5.2 11/17/2018   No results found for: PROLACTIN Lab Results  Component Value Date   CHOL 222 (H) 06/18/2021   TRIG 72.0 06/18/2021   HDL 56.90 06/18/2021   CHOLHDL 4 06/18/2021   VLDL 14.4 06/18/2021   LDLCALC 150 (H) 06/18/2021   LDLCALC 166 (H) 11/17/2018   Lab Results  Component Value Date   TSH 0.735 11/17/2018   TSH 2.49 05/05/2017    Therapeutic Level Labs: No results found for: LITHIUM No results found for: VALPROATE No results found for: CBMZ  Screenings:  AUDIT    Advertising Copywriter from 01/16/2022 in [facility]  Alcohol Use Disorder Identification Test Final Score (AUDIT) 8   GAD-7    Flowsheet Row Office Visit from 01/20/2024 in [facility] Clinical Support from 09/17/2023 in [facility] Counselor from 02/15/2023 in [facility] Office Visit from 04/24/2022 in [facility] Counselor from 01/16/2022 in [facility]  Total GAD-7 Score 13 4 8  0 7    PHQ2-9    Flowsheet Row Office Visit from 01/20/2024 in [facility] at [facility] Clinical Support from 09/17/2023 in [facility] Counselor from 02/15/2023 in [facility] Office Visit from 04/24/2022 in [facility] [employer] Counselor from 01/16/2022 in [facility]  PHQ-2 Total Score 1 3 4  0 5  PHQ-9 Total Score 7 8 15 2 14    Flowsheet Row Counselor from 01/16/2022 in [facility] Video Visit from 05/27/2021 in [facility]  C-SSRS RISK CATEGORY Low Risk No Risk      [name], MD 01/20/2024, 10:00 AM

## 2024-01-25 ENCOUNTER — Telehealth: Payer: Self-pay | Admitting: Professional Counselor

## 2024-01-25 NOTE — Telephone Encounter (Signed)
 Attempted to contact pt about DBT skills group referral. Received VM and left HIPAA compliant message.

## 2024-02-01 ENCOUNTER — Ambulatory Visit: Admitting: Medical

## 2024-02-01 VITALS — BP 148/98 | HR 84 | Temp 98.3°F | Resp 14 | Ht 68.5 in | Wt 202.2 lb

## 2024-02-01 DIAGNOSIS — I10 Essential (primary) hypertension: Secondary | ICD-10-CM

## 2024-02-01 DIAGNOSIS — B009 Herpesviral infection, unspecified: Secondary | ICD-10-CM

## 2024-02-01 MED ORDER — VALSARTAN 160 MG PO TABS: 160.0000 mg | ORAL_TABLET | Freq: Every day | ORAL | 3 refills | Status: AC

## 2024-02-01 NOTE — Progress Notes (Signed)
 Subjective:    Patient ID: Kathryn [name], female    DOB: [birth year], 28 y.o.   MRN: [id number]  HPI  Kathryn [name] is a 28 year old female with hypertension who presents for blood pressure management.  She notes home blood pressures consistently in the 140s/90s, with prior readings up to 179/100  before med started but no recent spikes after valsartan  started. Readings are higher when she is active, and she often does not rest before checking.  She takes valsartan  80 mg daily for hypertension and valacyclovir  daily as suppressive therapy for herpes. She is working with her psychiatrist to change her stimulant from immediate release to extended release to help manage symptoms without worsening her blood pressure.      Review of Systems  Constitutional:  Negative for chills and fever.  Respiratory:  Negative for cough, chest tightness and wheezing.   Cardiovascular:  Negative for chest pain and palpitations.  Gastrointestinal:  Negative for abdominal pain.  Musculoskeletal:  Negative for back pain, myalgias and neck stiffness.  Skin:  Negative for rash.  Neurological:  Negative for dizziness, speech difficulty and numbness.  Hematological:  Negative for adenopathy.  Psychiatric/Behavioral:  Negative for behavioral problems and hallucinations. The patient is not nervous/anxious.     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   Headache 04/09/2016   Hyperlipidemia, mixed 02/02/2016   Migraine    Ovarian cyst    PCOS (polycystic ovarian syndrome)    Preventative health care 10/27/2015   Thrombocytosis 04/09/2016   Vaginal Pap  smear, abnormal    HGSIL     Social History   Socioeconomic History   Marital status: Single    Spouse name: Not on file   Number of children: 0   Years of education: 12   Highest education level: Not on file  Occupational History    Comment: [employer]  Tobacco Use   Smoking status: Every Day    Types: E-cigarettes   Smokeless tobacco: Current    Types: Snuff  Vaping Use   Vaping status: Never Used  Substance and Sexual Activity   Alcohol use: Yes    Comment: maybe every 6 months   Drug use: Yes    Types: Marijuana    Comment: daily   Sexual activity: Yes    Partners: Male    Birth control/protection: Pill  Other Topics Concern   Not on file  Social History Narrative   Not on file   Social Drivers of Health   Tobacco Use: High Risk (01/20/2024)   Patient History    Smoking Tobacco Use: Every Day    Smokeless Tobacco Use: Current    Passive Exposure: Not on file  Financial Resource Strain: Medium Risk (01/16/2022)   Overall Financial Resource Strain (CARDIA)    Difficulty of Paying Living Expenses: Somewhat hard  Food Insecurity: No Food Insecurity (01/16/2022)   Hunger Vital Sign    Worried About Running Out of Food in the Last Year: Never true    Ran Out of Food in the Last  Year: Never true  Transportation Needs: No Transportation Needs (01/16/2022)   PRAPARE - Administrator, Civil Service (Medical): No    Lack of Transportation (Non-Medical): No  Physical Activity: Inactive (01/16/2022)   Exercise Vital Sign    Days of Exercise per Week: 0 days    Minutes of Exercise per Session: 0 min  Stress: No Stress Concern Present (01/16/2022)   [employer] of Occupational Health - Occupational Stress Questionnaire    Feeling of Stress : Only a little  Social Connections: Socially Isolated (01/16/2022)   Social Connection and Isolation Panel    Frequency of Communication with Friends and Family: More than three times a week    Frequency of Social  Gatherings with Friends and Family: Never    Attends Religious Services: Never    Database Administrator or Organizations: No    Attends Banker Meetings: Never    Marital Status: Never married  Intimate Partner Violence: Not At Risk (01/16/2022)   Humiliation, Afraid, Rape, and Kick questionnaire    Fear of Current or Ex-Partner: No    Emotionally Abused: No    Physically Abused: No    Sexually Abused: No  Depression (PHQ2-9): Medium Risk (01/20/2024)   Depression (PHQ2-9)    PHQ-2 Score: 7  Alcohol Screen: Medium Risk (01/16/2022)   Alcohol Screen    Last Alcohol Screening Score (AUDIT): 8  Housing: Medium Risk (01/16/2022)   Housing    Last Housing Risk Score: 1  Utilities: Not At Risk (01/16/2022)   AHC Utilities    Threatened with loss of utilities: No  Health Literacy: Not on file    Past Surgical History:  Procedure Laterality Date   ADENOIDECTOMY  2002   TYMPANOSTOMY TUBE PLACEMENT Bilateral 1999    Family History  Problem Relation Age of Onset   Hypertension Mother    Heart disease Mother        CAD, stent at 10, s/p cardiac arrest with Defib   Hyperlipidemia Mother    Diabetes Mother        s/p gastric bypass   Obesity Mother        s/p gastric bypass   OCD Mother    Hyperlipidemia Father    Hypertension Father    Anxiety disorder Father    Asthma Sister    Hyperlipidemia Brother    Anxiety disorder Brother    Stroke Maternal Grandmother    Thyroid  disease Maternal Grandmother    Anxiety disorder Maternal Grandmother    Heart disease Maternal Grandfather        MI first at 16, s/p stents and CABG   Hyperlipidemia Maternal Grandfather    Hypertension Maternal Grandfather    Stroke Maternal Grandfather    Cancer Maternal Grandfather        melanoma   Aneurysm Maternal Grandfather        brain   Kidney disease Maternal Grandfather        tumor   Heart disease Paternal Grandmother    Cancer Paternal Grandfather        ung and brain    Heart disease Maternal Uncle    Vision loss Maternal Uncle    Anxiety disorder Maternal Uncle    Anxiety disorder Maternal Aunt    Depression Maternal Aunt     Allergies[1]  Medications Ordered Prior to Encounter[2]  BP (!) 148/98   Pulse 84   Temp 98.3 F (36.8 C) (Oral)   Resp 14  Ht 5' 8.5 (1.74 m)   Wt 202 lb 3.2 oz (91.7 kg)   LMP 01/20/2024 (Approximate)   SpO2 99%   BMI 30.30 kg/m          Objective:   Physical Exam  General Mental Status- Alert. General Appearance- Not in acute distress.   Skin General: Color- Normal Color. Moisture- Normal Moisture.  Neck No JVD.  Chest and Lung Exam Auscultation: Breath Sounds:-Normal.  Cardiovascular Auscultation:Rythm- Regular. Murmurs & Other Heart Sounds:Auscultation of the heart reveals- No Murmurs.  Abdomen Inspection:-Inspeection Normal. Palpation/Percussion:Note:No mass. Palpation and Percussion of the abdomen reveal- Non Tender, Non Distended + BS, no rebound or guarding.   Neurologic Cranial Nerve exam:- CN III-XII intact(No nystagmus), symmetric smile. trength:- 5/5 equal and symmetric strength both upper and lower extremities.       Assessment & Plan:   Essential hypertension Blood pressure improved but remains above target. Current treatment includes valsartan , with plans to increase dosage for better control. Stimulant/attention medication adjustment planned by specialist to prevent blood pressure increase. - Increased valsartan  to 160 mg daily. - Instructed to monitor blood pressure once daily, ensuring to rest for 5 minutes before measurement. - Advised to send MyChart update with daily blood pressure readings in 7-10 days.  Herpes simplex virus infection Currently on valacyclovir  for suppressive therapy. Prescription management ongoing. - Refilled valacyclovir  with 90 tablets and 3 refills.  Follow up date to be determined after bp update in 7-10 days. Wait for my chart up  date.  Dallas Maxwell, PA-C     [1]  Allergies Allergen Reactions   Oxycodone  Nausea Only, Nausea And Vomiting and Other (See Comments)    Oxycontin  also Oxycontin  also Oxycontin  also Oxycontin  also    Quetiapine  Fumarate Other (See Comments)    Mental status changes Mental status changes Mental status changes Mental status changes   [2]  Current Outpatient Medications on File Prior to Visit  Medication Sig Dispense Refill   amphetamine -dextroamphetamine  (ADDERALL) 12.5 MG tablet Take 2 tablets by mouth 2 (two) times daily. 120 tablet 0   [START ON 02/09/2024] amphetamine -dextroamphetamine  (ADDERALL) 12.5 MG tablet Take 2 tablets by mouth 2 (two) times daily. 120 tablet 0   [START ON 02/28/2024] lamoTRIgine  (LAMICTAL ) 200 MG tablet Take 1 tablet (200 mg total) by mouth at bedtime. Take one a day 90 tablet 2   Levonorgestrel-Ethinyl Estradiol (AMETHIA) 0.15-0.03 &0.01 MG tablet Take 1 tablet by mouth daily. 91 tablet 4   omeprazole  (PRILOSEC) 40 MG capsule Take 1 capsule (40 mg total) by mouth 2 (two) times daily. 180 capsule 3   ondansetron  (ZOFRAN ) 4 MG tablet Take 1 tablet (4 mg total) by mouth every 8 (eight) hours as needed for nausea or vomiting. 10 tablet 0   valACYclovir  (VALTREX ) 1000 MG tablet Take 1 tablet (1,000 mg total) by mouth daily. 90 tablet 1   venlafaxine  XR (EFFEXOR -XR) 75 MG 24 hr capsule Take 1 capsule (75 mg total) by mouth daily with breakfast. TAKE ONE CAPSULE BY MOUTH DAILY  WITH BREAKFAST 90 capsule 0   No current facility-administered medications on file prior to visit.

## 2024-02-01 NOTE — Patient Instructions (Signed)
 Essential hypertension Blood pressure improved but remains above target. Current treatment includes valsartan , with plans to increase dosage for better control. Stimulant/attention medication adjustment planned by specialist to prevent blood pressure increase. - Increased valsartan  to 160 mg daily. - Instructed to monitor blood pressure once daily, ensuring to rest for 5 minutes before measurement. - Advised to send MyChart update with daily blood pressure readings in 7-10 days.  Herpes simplex virus infection Currently on valacyclovir  for suppressive therapy. Prescription management ongoing. - Refilled valacyclovir  with 90 tablets and 3 refills.  Follow up date to be determined after bp update in 7-10 days. Wait for my chart up date.

## 2024-02-01 NOTE — Progress Notes (Signed)
° °  Subjective:    Patient ID: Kathryn [name], female    DOB: [birth year], 28 y.o.   MRN: [id number]  HPI    Review of Systems     Objective:   Physical Exam        Assessment & Plan:

## 2024-02-04 ENCOUNTER — Ambulatory Visit: Payer: Self-pay

## 2024-02-04 NOTE — Telephone Encounter (Signed)
 Left message , 1st attempt      Notes  [name], Kathryn E   02/04/2024 12:02 PM  Type: Nurse Triage Message  Reason for Triage: The patient's valsartan  (DIOVAN ) 160 MG tablet has been increased in dosage and she is having adverse reactions. Her blood pressure has been spiking for 3 days straight now. Today when it spiked, she started having nausea, sweats, hot flashes, weakness, and shaking.    She is unsure of what her reading was but the first time she felt a spike happening the same symptoms occurred and she read her blood pressure at 117/110.

## 2024-02-04 NOTE — Telephone Encounter (Signed)
 FYI Only or Action Required?: Action required by provider: clinical question for provider.  Patient was last seen in primary care on 02/01/2024 by Dorina Loving, PA-C.  Called Nurse Triage reporting Hypertension and Medication Reaction.  Symptoms began several days ago.  Interventions attempted: Rest, hydration, or home remedies.  Symptoms are: unchanged.  Triage Disposition: See PCP When Office is Open (Within 3 Days)  Patient/caregiver understands and will follow disposition?: No, wishes to speak with PCP

## 2024-02-04 NOTE — Telephone Encounter (Signed)
" ° ° °  Reason for Disposition  Systolic BP >= 160 OR Diastolic >= 100  Answer Assessment - Initial Assessment Questions Pt saw provider on Tuesday and increased her valsartan  to 160mg . Tuesday she began the increased dose, took it in the evening (that's when she typically takes her BP meds). She woke up from a dead sleep that night/next morning at 4am vomiting, headache, sweating feels like her typical symptoms with BP spikes, checked her BP after she was done vomiting and calmed down a bit and it was 159/110. She called off work, just thought she was sick. Felt a slight throbbing headache all day. Then yesterday around noon, she went to the bathroom, pulled up her pants turned around felt sweaty and nauseated. Unable to check her BP at the time but once she sits down symptoms subside after 10 minutes. Today at work same thing happened about 1030 after peeing. Symptoms she had Wednesday were the most severe,s he states she did have some cp with it but also has anxiety, vomiting and shortness of breath.  Pt would like to know what to do. Rn advised pt to keep a log of her BP (new cuff coming soon), if chest pain or shortness of breath happens with these episodes to go to the ER to be evaluated. She states once she sits down, everything calms down and is resolved within 10-15 minutes.   1. BLOOD PRESSURE: What is your blood pressure? Did you take at least two measurements 5 minutes apart?     Wednesday 159/110 2. ONSET: When did you take your blood pressure?     wednesday 3. HOW: How did you take your blood pressure? (e.g., automatic home BP monitor, visiting nurse)     Auto cuff 4. HISTORY: Do you have a history of high blood pressure?     yes 5. MEDICINES: Are you taking any medicines for blood pressure? Have you missed any doses recently?     Valsartan  no doses missed, increased dose 6. OTHER SYMPTOMS: Do you have any symptoms? (e.g., blurred vision, chest pain, difficulty  breathing, headache, weakness)     See triage noe  Protocols used: Blood Pressure - High-A-AH  "

## 2024-02-08 NOTE — Telephone Encounter (Signed)
 Called pt and no answer left message to return call. Also send a MyChart message to pt.

## 2024-02-14 ENCOUNTER — Other Ambulatory Visit: Payer: Self-pay | Admitting: Student

## 2024-02-14 DIAGNOSIS — B009 Herpesviral infection, unspecified: Secondary | ICD-10-CM

## 2024-02-17 ENCOUNTER — Other Ambulatory Visit: Payer: Self-pay | Admitting: Medical

## 2024-02-22 ENCOUNTER — Telehealth: Payer: Self-pay | Admitting: Professional Counselor

## 2024-02-22 NOTE — Telephone Encounter (Signed)
 Made second attempt to contact pt about DBT skills group referral. Received VM and left HIPAA compliant message

## 2024-02-23 ENCOUNTER — Ambulatory Visit: Payer: Self-pay

## 2024-02-23 ENCOUNTER — Encounter: Payer: Self-pay | Admitting: Family Medicine

## 2024-02-23 ENCOUNTER — Ambulatory Visit: Admitting: Family Medicine

## 2024-02-23 VITALS — BP 152/89 | HR 91 | Ht 68.5 in | Wt 198.0 lb

## 2024-02-23 DIAGNOSIS — I10 Essential (primary) hypertension: Secondary | ICD-10-CM

## 2024-02-23 DIAGNOSIS — L02216 Cutaneous abscess of umbilicus: Secondary | ICD-10-CM

## 2024-02-23 MED ORDER — CEPHALEXIN 500 MG PO CAPS: 500.0000 mg | ORAL_CAPSULE | Freq: Four times a day (QID) | ORAL | 0 refills | Status: AC

## 2024-02-23 MED ORDER — FLUCONAZOLE 150 MG PO TABS: ORAL_TABLET | ORAL | 0 refills | Status: AC

## 2024-02-23 NOTE — Telephone Encounter (Signed)
 FYI Only or Action Required?: FYI only for provider: appointment scheduled on 02/23/24.  Patient was last seen in primary care on 02/01/2024 by Dorina Loving, PA-C.  Called Nurse Triage reporting Abscess.  Symptoms began 5 days ago.  Interventions attempted: Other: squeezed abscess to drain pus after it erupted on its own.  Symptoms are: still having pus, pain and spreading redness improved.  Triage Disposition: See Physician Within 24 Hours  Patient/caregiver understands and will follow disposition?: Yes              Copied from CRM #[id number]. Topic: Clinical - Red Word Triage >> Feb 23, 2024  9:59 AM Kathryn [name] wrote: [facility] that prompted transfer to Nurse Triage: Abscess in belly button that popped this past Sunday. Area was leaking pus this morning. Patient requesting antibiotic and referral for surgeon. Reason for Disposition  [1] Spreading redness around the boil AND [2] no fever  Answer Assessment - Initial Assessment Questions Patient requesting surgeon referral and antibiotic  1. APPEARANCE of BOIL: What does the boil look like?      Spreading redness and warmth were present  prior to abscess erupting and draining, has resolved now.  2. LOCATION: Where is the boil located?      Umbilical.  3. NUMBER: How many boils are there?      1.  4. SIZE: How big is the boil? ([name].g., inches, cm; compare to size of a coin or other object)     About the size of a pinto bean, unsure how large the pocket below the skin is.  5. ONSET: When did the boil start?     5 days ago.  6. PAIN: Is there any pain? If Yes, ask: How bad is the pain?   (Scale 1-10; or mild, moderate, severe)     Not at this time. She states is was painful before it erupted and drained.  7. FEVER: Do you have a fever? If Yes, ask: What is it, how was it measured, and when did it start?      No.  8. SOURCE: Have you been around anyone with boils or other Staph infections? Have  you ever had boils before?     No diagnosis of staph or MRSA before. 8 years ago had abscess 1st time, 5 recurrent episodes since then. Last time ended up in ER for I&D. History of [name] coli in culture 8 years ago.  9. OTHER SYMPTOMS: Do you have any other symptoms? ([name].g., shaking chills, weakness, rash elsewhere on body)     Foul odor pus drainage started on Sunday and occurring again today. No rash on body, black or dark purple skin discoloration.  10. PREGNANCY: Is there any chance you are pregnant? When was your last menstrual period?       LMP: on birth control, 1.5 months ago.  Protocols used: Boil (Skin Abscess)-A-AH

## 2024-02-23 NOTE — Progress Notes (Signed)
 " Kathryn [name] - 29 y.o. female MRN [id number]  Date of birth: 16-Oct-1995  Subjective Chief Complaint  Patient presents with   Abscess    HPI Kathryn [name] is a 29 y.o. female here today with complaint of umbilical abscess.  This is a chronic problem that has acutely worsened.  She has had recurrent episodes over the past several years.  Had abscess lanced previously and now area swells and drains along previous I&D scar when she has a flare.  Surgery recommended at one point but decided not to have this completed.  This episode started about 5 days ago.  She has been able to express pus from the area.  Area with tenderness.  Redness has improved.  She would like referral to surgeon for have this treated definitively.   She has had issues with hypertension as well. Started on valsartan .  Had increase in BP after starting Valsartan .  Her provider reduced this to 1/2 tab.  Has not had renal function checked since starting ARB.  BP today is elevated.  No symptoms at this time.   ROS:  A comprehensive ROS was completed and negative except as noted per HPI  Allergies[1]  Past Medical History:  Diagnosis Date   Abnormal liver function tests 02/02/2016   ADHD 06/10/2016   Alcohol use 02/23/2016   Allergy    Anxiety    Arm fracture, right    as child, fx in three places, no surgery   Depression    Depression with anxiety 01-May-2014   PTSD s/p death of best friend    Essential hypertension 02/02/2016   GERD (gastroesophageal reflux disease)    H/O cold sores 04/15/2015   Headache 04/09/2016   Hyperlipidemia, mixed 02/02/2016   Migraine    Ovarian cyst    PCOS (polycystic ovarian syndrome)    Preventative health care 10/27/2015   Thrombocytosis 04/09/2016   Vaginal Pap smear, abnormal    HGSIL    Past Surgical History:  Procedure Laterality Date   ADENOIDECTOMY  2002   TYMPANOSTOMY TUBE PLACEMENT Bilateral 1999    Social History   Socioeconomic History   Marital status: Single     Spouse name: Not on file   Number of children: 0   Years of education: 12   Highest education level: Not on file  Occupational History    Comment: [employer]  Tobacco Use   Smoking status: Every Day    Types: E-cigarettes   Smokeless tobacco: Current    Types: Snuff  Vaping Use   Vaping status: Never Used  Substance and Sexual Activity   Alcohol use: Yes    Comment: maybe every 6 months   Drug use: Yes    Types: Marijuana    Comment: daily   Sexual activity: Yes    Partners: Male    Birth control/protection: Pill  Other Topics Concern   Not on file  Social History Narrative   Not on file   Social Drivers of Health   Tobacco Use: High Risk (02/23/2024)   Patient History    Smoking Tobacco Use: Every Day    Smokeless Tobacco Use: Current    Passive Exposure: Not on file  Financial Resource Strain: Medium Risk (01/16/2022)   Overall Financial Resource Strain (CARDIA)    Difficulty of Paying Living Expenses: Somewhat hard  Food Insecurity: No Food Insecurity (01/16/2022)   Hunger Vital Sign    Worried About Running Out of Food in the Last Year: Never true  Ran Out of Food in the Last Year: Never true  Transportation Needs: No Transportation Needs (01/16/2022)   PRAPARE - Administrator, Civil Service (Medical): No    Lack of Transportation (Non-Medical): No  Physical Activity: Inactive (01/16/2022)   Exercise Vital Sign    Days of Exercise per Week: 0 days    Minutes of Exercise per Session: 0 min  Stress: No Stress Concern Present (01/16/2022)   [employer] of Occupational Health - Occupational Stress Questionnaire    Feeling of Stress : Only a little  Social Connections: Socially Isolated (01/16/2022)   Social Connection and Isolation Panel    Frequency of Communication with Friends and Family: More than three times a week    Frequency of Social Gatherings with Friends and Family: Never    Attends Religious Services: Never    Doctor, General Practice or Organizations: No    Attends Banker Meetings: Never    Marital Status: Never married  Depression (PHQ2-9): Medium Risk (01/20/2024)   Depression (PHQ2-9)    PHQ-2 Score: 7  Alcohol Screen: Medium Risk (01/16/2022)   Alcohol Screen    Last Alcohol Screening Score (AUDIT): 8  Housing: Medium Risk (01/16/2022)   Housing    Last Housing Risk Score: 1  Utilities: Not At Risk (01/16/2022)   AHC Utilities    Threatened with loss of utilities: No  Health Literacy: Not on file    Family History  Problem Relation Age of Onset   Hypertension Mother    Heart disease Mother        CAD, stent at 84, s/p cardiac arrest with Defib   Hyperlipidemia Mother    Diabetes Mother        s/p gastric bypass   Obesity Mother        s/p gastric bypass   OCD Mother    Hyperlipidemia Father    Hypertension Father    Anxiety disorder Father    Asthma Sister    Hyperlipidemia Brother    Anxiety disorder Brother    Stroke Maternal Grandmother    Thyroid  disease Maternal Grandmother    Anxiety disorder Maternal Grandmother    Heart disease Maternal Grandfather        MI first at 79, s/p stents and CABG   Hyperlipidemia Maternal Grandfather    Hypertension Maternal Grandfather    Stroke Maternal Grandfather    Cancer Maternal Grandfather        melanoma   Aneurysm Maternal Grandfather        brain   Kidney disease Maternal Grandfather        tumor   Heart disease Paternal Grandmother    Cancer Paternal Grandfather        ung and brain   Heart disease Maternal Uncle    Vision loss Maternal Uncle    Anxiety disorder Maternal Uncle    Anxiety disorder Maternal Aunt    Depression Maternal Aunt     Health Maintenance  Topic Date Due   Hepatitis B Vaccines 19-59 Average Risk (1 of 3 - 19+ 3-dose series) 03/11/2024 (Originally 01/11/2014)   HPV VACCINES (3 - 3-dose series) 03/11/2024 (Originally 09/10/2021)   Influenza Vaccine  05/16/2024 (Originally 09/17/2023)   COVID-19  Vaccine (2 - 2025-26 season) 10/16/2024 (Originally 10/18/2023)   Pneumococcal Vaccine (1 of 2 - PCV) 01/19/2025 (Originally 01/11/2014)   Cervical Cancer Screening (Pap smear)  06/04/2024   DTaP/Tdap/Td (2 - Td or Tdap) 11/17/2027   HIV  Screening  Completed   Meningococcal B Vaccine  Aged Out   Hepatitis C Screening  Discontinued     ----------------------------------------------------------------------------------------------------------------------------------------------------------------------------------------------------------------- Physical Exam BP (!) 152/89   Pulse 91   Ht 5' 8.5 (1.74 m)   Wt 198 lb (89.8 kg)   SpO2 99%   BMI 29.67 kg/m   Physical Exam Constitutional:      Appearance: Normal appearance.  HENT:     Head: Normocephalic and atraumatic.  Cardiovascular:     Rate and Rhythm: Normal rate and regular rhythm.  Pulmonary:     Effort: Pulmonary effort is normal.     Breath sounds: Normal breath sounds.  Skin:    Comments: Small umbilical abscess, draining purulent material.   Neurological:     General: No focal deficit present.     Mental Status: She is alert.  Psychiatric:        Mood and Affect: Mood normal.        Behavior: Behavior normal.     ------------------------------------------------------------------------------------------------------------------------------------------------------------------------------------------------------------------- Assessment and Plan  Essential hypertension BP remains elevated.  Has not had renal function rechecked yet since starting Valsartan .  Rechecking renal function.  Return in 1-2 weeks.   Abscess, umbilical Adding course of cephalexin .  No I&D as this is spontaneously draining.  Warm compresses recommended.  Referral placed to general surgery.    Meds ordered this encounter  Medications   cephALEXin  (KEFLEX ) 500 MG capsule    Sig: Take 1 capsule (500 mg total) by mouth 4 (four) times daily.     Dispense:  40 capsule    Refill:  0   fluconazole  (DIFLUCAN ) 150 MG tablet    Sig: Repeat after 72 hours if needed.    Dispense:  2 tablet    Refill:  0    Return in about 2 weeks (around 03/08/2024) for f/u with me for HTN/Establish care.        [1]  Allergies Allergen Reactions   Oxycodone  Nausea And Vomiting, Nausea Only and Other (See Comments)    Oxycontin  also     Quetiapine  Fumarate Other (See Comments)    Mental status changes    "

## 2024-02-23 NOTE — Assessment & Plan Note (Signed)
 BP remains elevated.  Has not had renal function rechecked yet since starting Valsartan .  Rechecking renal function.  Return in 1-2 weeks.

## 2024-02-23 NOTE — Assessment & Plan Note (Signed)
 Adding course of cephalexin .  No I&D as this is spontaneously draining.  Warm compresses recommended.  Referral placed to general surgery.

## 2024-02-24 ENCOUNTER — Ambulatory Visit: Payer: Self-pay | Admitting: Family Medicine

## 2024-02-24 LAB — BASIC METABOLIC PANEL WITH GFR
BUN/Creatinine Ratio: 13 (ref 9–23)
BUN: 10 mg/dL (ref 6–20)
CO2: 23 mmol/L (ref 20–29)
Calcium: 9.8 mg/dL (ref 8.7–10.2)
Chloride: 105 mmol/L (ref 96–106)
Creatinine, Ser: 0.79 mg/dL (ref 0.57–1.00)
Glucose: 86 mg/dL (ref 70–99)
Potassium: 4.4 mmol/L (ref 3.5–5.2)
Sodium: 141 mmol/L (ref 134–144)
eGFR: 104 mL/min/1.73

## 2024-03-06 ENCOUNTER — Other Ambulatory Visit (HOSPITAL_COMMUNITY): Payer: Self-pay | Admitting: Student in an Organized Health Care Education/Training Program

## 2024-03-06 DIAGNOSIS — F319 Bipolar disorder, unspecified: Secondary | ICD-10-CM

## 2024-03-06 DIAGNOSIS — F411 Generalized anxiety disorder: Secondary | ICD-10-CM

## 2024-03-09 ENCOUNTER — Telehealth (INDEPENDENT_AMBULATORY_CARE_PROVIDER_SITE_OTHER): Admitting: Student in an Organized Health Care Education/Training Program

## 2024-03-09 DIAGNOSIS — F900 Attention-deficit hyperactivity disorder, predominantly inattentive type: Secondary | ICD-10-CM | POA: Diagnosis not present

## 2024-03-09 DIAGNOSIS — F411 Generalized anxiety disorder: Secondary | ICD-10-CM

## 2024-03-09 DIAGNOSIS — F319 Bipolar disorder, unspecified: Secondary | ICD-10-CM | POA: Diagnosis not present

## 2024-03-09 MED ORDER — LISDEXAMFETAMINE DIMESYLATE 50 MG PO CAPS: 50.0000 mg | ORAL_CAPSULE | Freq: Every day | ORAL | 0 refills | Status: DC

## 2024-03-09 NOTE — Progress Notes (Addendum)
 [facility] MD Outpatient Progress Note  03/09/2024 3:01 PM Kathryn [name]  MRN:  [id number]  Virtual Visit via Video Note  I connected with Kathryn [name] on 03/09/24 at  3:00 PM EST by a video enabled telemedicine application and verified that I am speaking with the correct person using two identifiers.  Location: Patient: Car, parked, in a safe place Provider: Office   I discussed the limitations, risks, security and privacy concerns of performing an evaluation and management service by telephone and the availability of in person appointments. I also discussed with the patient that there may be a patient responsible charge related to this service. The patient expressed understanding and agreed to proceed.   I discussed the assessment and treatment plan with the patient. The patient was provided an opportunity to ask questions and all were answered. The patient agreed with the plan and demonstrated an understanding of the instructions.   The patient was advised to call back or seek an in-person evaluation if the symptoms worsen or if the condition fails to improve as anticipated.   Kathryn Masson, MD Psych Resident, PGY-3      Assessment:  [name] presents for follow-up evaluation on 03/09/24 .   Since the last visit, the patient reports stable mood with continued remission of depressive and anxiety symptoms, without new concerns. She has followed up with her PCP and is actively addressing hypertension. Transition from Adderall IR 25 mg BID to Vyvanse  is underway to support smoother symptom coverage . Patient was advised to monitor for changes in focus, mood, blood pressure, or stimulant-related side effects during the transition and to contact me if issues arise. She is interested in starting trauma focused therapy, resources were provided in her AVS.  Care will transition to the [facility] going forward.   Identifying Information: Kathryn [name] is a 29 y.o. female with a history  of Bipolar Disorder, GAD, PTSD, and ADHD who is an established patient with [facility] for management of mood. She was formerly a patient of Dr. Raliegh. For a comprehensive history and detailed assessment, please refer to the initial adult assessment.  The patient's PMHx is significant for GERD.   Plan:  # Bipolar I Disorder vs underlying Cluster B personality traits #PTSD #GAD Past medication trials: Remeron  due to AEs, Prozac (stopped due to futility), Zoloft  (stopped due to futility), Risperdal  Status of problem: Stabl Interventions: -- Continue Effexor  XR 75 mg daily for depression and anxiety -- Continue Lamictal  200 mg QHS for mood stability -- Continue therapy with Bernice  # ADHD Past medication trials: strattera (stopped due to futility), ritalin  Status of problem: Unchanged, chronic Interventions: -- Transition from Adderall 25 mg BID to Vyvanse  50 gm daily  #HTN -- Established with PCP -- Valsartan  160 mg daily -- Multifactorial (family history of hypertension, ongoing alcohol and nicotine use, excess caffeine use,on several hypertensive medications, lifestyle)  # Tobacco use disorder (vape) Prior tiral of chantix  -- Declined NRT -- Smoking cessation encouraged  Patient was given contact information for behavioral health clinic and was instructed to call 911 for emergencies.   Subjective:  Chief Complaint:  Chief Complaint  Patient presents with   Follow-up    Interval History:   Patient reports she has been doing well overall. She describes that her hypertension was addressed and she was started on valsartan , but she reports experiencing a bad reaction at a higher dose. 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  Patient reports no significant depressive symptoms at this time. She describes no significant  anxiety symptoms. She reports improved sleep, which she attributes to overall improvement in her mental health, and states that night terrors have also improved. She describes her appetite as inconsistent due to forgetting meals but states she tries to set reminders.  Patient reports cutting down on caffeine intake. She describes drinking alcohol socially. She reports cutting down on tobacco use, stating it is less frequent, and notes using adult chewies for oral fixation, with a decrease from approximately eight cartridges per month to about four.  Patient reports adverse effects only related to the higher dose of valsartan . She describes no suicidal ideation, no homicidal ideation, and denies auditory or visual hallucinations.    Visit Diagnosis:    ICD-10-CM   1. Attention deficit hyperactivity disorder (ADHD), predominantly inattentive type  F90.0 lisdexamfetamine  (VYVANSE ) 50 MG capsule    2. Bipolar 1 disorder, depressed (HCC)  F31.9     3. 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, registered development worker, community Social support: best friends and best friend's family, deceased gf's family Maritas status: Single Legal: Denies Firearms: Denies   Social History   Socioeconomic History   Marital status: Single    Spouse name: Not on file   Number of children: 0   Years of education: 12   Highest education level: Not on file  Occupational History    Comment: [employer]  Tobacco Use   Smoking status:  Every Day    Types: E-cigarettes   Smokeless tobacco: Current    Types: Snuff  Vaping Use   Vaping status: Never Used  Substance and Sexual Activity   Alcohol use: Yes    Comment: maybe every 6 months   Drug use: Yes    Types: Marijuana    Comment: daily   Sexual activity: Yes    Partners: Male    Birth control/protection: Pill  Other Topics Concern   Not on file  Social History Narrative   Not on file   Social Drivers of Health   Tobacco Use: High Risk (02/23/2024)   Patient History    Smoking Tobacco Use: Every Day    Smokeless Tobacco Use: Current    Passive Exposure: Not on file  Financial Resource Strain: Medium Risk (01/16/2022)   Overall Financial Resource Strain (CARDIA)    Difficulty of Paying Living Expenses: Somewhat hard  Food Insecurity: No Food Insecurity (01/16/2022)   Hunger Vital Sign    Worried About Running Out of Food in the Last Year: Never true    Ran Out of Food in the Last Year: Never true  Transportation Needs: No Transportation Needs (01/16/2022)   PRAPARE - Administrator, Civil Service (Medical): No    Lack of Transportation (Non-Medical): No  Physical Activity: Inactive (01/16/2022)   Exercise Vital Sign    Days of Exercise per Week: 0 days    Minutes  of Exercise per Session: 0 min  Stress: No Stress Concern Present (01/16/2022)   [employer] of Occupational Health - Occupational Stress Questionnaire    Feeling of Stress : Only a little  Social Connections: Socially Isolated (01/16/2022)   Social Connection and Isolation Panel    Frequency of Communication with Friends and Family: More than three times a week    Frequency of Social Gatherings with Friends and Family: Never    Attends Religious Services: Never    Database Administrator or Organizations: No    Attends Banker Meetings: Never    Marital Status: Never married  Depression (PHQ2-9): Medium Risk (01/20/2024)   Depression (PHQ2-9)    PHQ-2 Score: 7   Alcohol Screen: Medium Risk (01/16/2022)   Alcohol Screen    Last Alcohol Screening Score (AUDIT): 8  Housing: Medium Risk (01/16/2022)   Housing    Last Housing Risk Score: 1  Utilities: Not At Risk (01/16/2022)   AHC Utilities    Threatened with loss of utilities: No  Health Literacy: Not on file    Allergies:  Allergies  Allergen Reactions   Oxycodone  Nausea And Vomiting, Nausea Only and Other (See Comments)    Oxycontin  also     Quetiapine  Fumarate Other (See Comments)    Mental status changes     Current Medications: Current Outpatient Medications  Medication Sig Dispense Refill   amphetamine -dextroamphetamine  (ADDERALL) 12.5 MG tablet Take 2 tablets by mouth 2 (two) times daily. 120 tablet 0   amphetamine -dextroamphetamine  (ADDERALL) 12.5 MG tablet Take 2 tablets by mouth 2 (two) times daily. 120 tablet 0   cephALEXin  (KEFLEX ) 500 MG capsule Take 1 capsule (500 mg total) by mouth 4 (four) times daily. 40 capsule 0   fluconazole  (DIFLUCAN ) 150 MG tablet Repeat after 72 hours if needed. 2 tablet 0   lamoTRIgine  (LAMICTAL ) 200 MG tablet Take 1 tablet (200 mg total) by mouth at bedtime. Take one a day 90 tablet 2   Levonorgestrel-Ethinyl Estradiol (AMETHIA) 0.15-0.03 &0.01 MG tablet Take 1 tablet by mouth daily. 91 tablet 4   omeprazole  (PRILOSEC) 40 MG capsule Take 1 capsule (40 mg total) by mouth 2 (two) times daily. 180 capsule 3   ondansetron  (ZOFRAN ) 4 MG tablet Take 1 tablet (4 mg total) by mouth every 8 (eight) hours as needed for nausea or vomiting. 10 tablet 0   valACYclovir  (VALTREX ) 1000 MG tablet Take 1 tablet (1,000 mg total) by mouth daily. 90 tablet 1   valsartan  (DIOVAN ) 160 MG tablet Take 1 tablet (160 mg total) by mouth daily. 90 tablet 3   venlafaxine  XR (EFFEXOR -XR) 75 MG 24 hr capsule TAKE 1 CAPSULE BY MOUTH DAILY WITH BREAKFAST 90 capsule 0   No current facility-administered medications for this visit.    ROS: Review of Systems  All other systems  reviewed and are negative.   Objective:  Objective: Psychiatric Specialty Exam: General Appearance: Casual, fairly groomed  Eye Contact:  Good    Speech:  Clear, coherent, normal rate, spontaneous  Volume:  Normal   Mood:  see above  Affect:  Appropriate, congruent, full range  Thought Content: Logical  Suicidal Thoughts: see subjective  Thought Process:  Coherent, goal-directed  Orientation:  A&Ox4   Memory:  Immediate good  Judgment:  Fair   Insight:  Partial  Concentration:  Attention and concentration good   Recall:  Good  Fund of Knowledge: Good  Language: Good, fluent  Psychomotor Activity: Normal  Akathisia:  NA  AIMS (if indicated): NA   Assets:   Communication Skills Desire for Improvement Housing Resilience Social Support Talents/Skills Vocational/Educational  ADL's:  Intact  Cognition: WNL  Sleep: see above  Appetite: see above    Physical Exam Vitals reviewed.  Constitutional:      Appearance: Normal appearance.  Eyes:     Extraocular Movements: Extraocular movements intact.     Conjunctiva/sclera: Conjunctivae normal.  Pulmonary:     Effort: Pulmonary effort is normal. No respiratory distress.  Musculoskeletal:        General: Normal range of motion.  Skin:    General: Skin is warm and dry.  Neurological:     General: No focal deficit present.     Mental Status: She is alert.      Metabolic Disorder Labs: Lab Results  Component Value Date   HGBA1C 5.2 11/17/2018   No results found for: PROLACTIN Lab Results  Component Value Date   CHOL 224 (H) 01/20/2024   TRIG 66.0 01/20/2024   HDL 54.00 01/20/2024   CHOLHDL 4 01/20/2024   VLDL 13.2 01/20/2024   LDLCALC 157 (H) 01/20/2024   LDLCALC 150 (H) 06/18/2021   Lab Results  Component Value Date   TSH 0.735 11/17/2018   TSH 2.49 05/05/2017    Therapeutic Level Labs: No results found for: LITHIUM No results found for: VALPROATE No results found for: CBMZ  Screenings:   AUDIT    Advertising Copywriter from 01/16/2022 in [facility]  Alcohol Use Disorder Identification Test Final Score (AUDIT) 8   GAD-7    Flowsheet Row Office Visit from 01/20/2024 in [facility] Clinical Support from 09/17/2023 in [facility] Counselor from 02/15/2023 in [facility] Office Visit from 04/24/2022 in [facility] Counselor from 01/16/2022 in [facility]  Total GAD-7 Score 13 4 8  0 7   PHQ2-9    Flowsheet Row Office Visit from 01/20/2024 in [facility] at [facility] Clinical Support from 09/17/2023 in [facility] Counselor from 02/15/2023 in [facility] Office Visit from 04/24/2022 in [facility] [employer] Counselor from 01/16/2022 in [facility]  PHQ-2 Total Score 1 3 4  0 5  PHQ-9 Total Score 7 8 15 2 14    Flowsheet Row Counselor from 01/16/2022 in [facility] Video Visit from 05/27/2021 in [facility]  C-SSRS RISK CATEGORY Low Risk No Risk      Kathryn Masson, MD 03/09/2024, 3:01 PM

## 2024-03-09 NOTE — Patient Instructions (Addendum)
 [facility] at [facility] [address] [place] [address] [place],  [place]  [address] Main: [phone]     Trauma Focused Therapy Resources: [facility]  [phone]  Emmie Perna [phone]

## 2024-03-10 ENCOUNTER — Telehealth (HOSPITAL_COMMUNITY): Payer: Self-pay | Admitting: Student in an Organized Health Care Education/Training Program

## 2024-03-10 DIAGNOSIS — F900 Attention-deficit hyperactivity disorder, predominantly inattentive type: Secondary | ICD-10-CM

## 2024-03-10 MED ORDER — LISDEXAMFETAMINE DIMESYLATE 50 MG PO CAPS: 50.0000 mg | ORAL_CAPSULE | Freq: Every day | ORAL | 0 refills | Status: AC

## 2024-03-10 NOTE — Telephone Encounter (Signed)
 Was informed by staff the patient had reached out due to high copay for vyvanse  at her current pharmacy.  Contacted and spoke with the patient this afternoon to identify pharmacy with lower co-pay.  The prescription for Vyvanse  50 mg daily was resubmitted to [facility] in [facility]. I also called [facility], LVM requesting prescription at their location was canceled.

## 2024-03-22 ENCOUNTER — Ambulatory Visit: Admitting: Family Medicine

## 2024-03-23 ENCOUNTER — Ambulatory Visit (HOSPITAL_COMMUNITY): Admitting: Mental Health

## 2024-03-23 DIAGNOSIS — F4310 Post-traumatic stress disorder, unspecified: Secondary | ICD-10-CM

## 2024-03-23 DIAGNOSIS — F319 Bipolar disorder, unspecified: Secondary | ICD-10-CM

## 2024-03-23 DIAGNOSIS — F900 Attention-deficit hyperactivity disorder, predominantly inattentive type: Secondary | ICD-10-CM

## 2024-03-23 NOTE — Progress Notes (Unsigned)
" ° °  THERAPIST PROGRESS NOTE Virtual Visit via Video Note  I connected with Kathryn [name] on 03/23/24 at  4:00 PM EST by a video enabled telemedicine application and verified that I am speaking with the correct person using two identifiers.  Location: Patient: *** Provider: ***   I discussed the limitations of evaluation and management by telemedicine and the availability of in person appointments. The patient expressed understanding and agreed to proceed.  I discussed the assessment and treatment plan with the patient. The patient was provided an opportunity to ask questions and all were answered. The patient agreed with the plan and demonstrated an understanding of the instructions.   The patient was advised to call back or seek an in-person evaluation if the symptoms worsen or if the condition fails to improve as anticipated.  I provided *** minutes of non-face-to-face time during this encounter.   Ty Bernice Savant, [facility]   Session Time: 4:03pm  Participation Level: Active  Behavioral Response: CasualAlertEuthymic  Type of Therapy: Individual Therapy  Treatment Goals addressed: ***  ProgressTowards Goals: {Progress Towards Goals:21014066}  Interventions: CBT and Supportive  Summary: Kathryn [name] is a 29 y.o. female who presents with dx of bipolar d/o, PTSD, GAD and ADHD. Kathryn [name] presents alert and oriented; mood and affect adequate, stable.  Speech clear and coherent at normal rate and tone. Engaged. Shares to be doing well and reports for moods to   Suicidal/Homicidal: Nowithout intent/plan  Therapist Response: Therapist engaged Museum[employer] Exhibitions Officer in [employer]. Confirmed current location and ability to hold confidential session. Therapist engaged [name] in check in and assessed for current level of functioning sxs management. Active empathic listening providing support and   Plan: Return again in *** weeks.  Diagnosis: Bipolar 1 disorder, depressed  (HCC)  Post-traumatic stress  Attention deficit hyperactivity disorder (ADHD), predominantly inattentive type  Collaboration of Care: Other None  Patient/Guardian was advised Release of Information must be obtained prior to any record release in order to collaborate their care with an outside provider. Patient/Guardian was advised if they have not already done so to contact the registration department to sign all necessary forms in order for us  to release information regarding their care.   Consent: Patient/Guardian gives verbal consent for treatment and assignment of benefits for services provided during this visit. Patient/Guardian expressed understanding and agreed to proceed.   Ty Bernice [place], [facility] 03/23/2024  "

## 2024-04-10 ENCOUNTER — Telehealth (HOSPITAL_COMMUNITY): Admitting: Student in an Organized Health Care Education/Training Program
